# Patient Record
Sex: Female | Born: 1935 | Race: White | Hispanic: Refuse to answer | Marital: Married | Smoking: Former smoker
Health system: Southern US, Community
[De-identification: ages and names within clinical notes are randomized; demographics above are authoritative.]

## PROBLEM LIST (undated history)

## (undated) ENCOUNTER — Emergency Department (HOSPITAL_BASED_OUTPATIENT_CLINIC_OR_DEPARTMENT_OTHER): Admission: EM

## (undated) DIAGNOSIS — Z9889 Other specified postprocedural states: Secondary | ICD-10-CM

## (undated) DIAGNOSIS — I10 Essential (primary) hypertension: Secondary | ICD-10-CM

## (undated) DIAGNOSIS — C801 Malignant (primary) neoplasm, unspecified: Secondary | ICD-10-CM

## (undated) DIAGNOSIS — R112 Nausea with vomiting, unspecified: Secondary | ICD-10-CM

## (undated) DIAGNOSIS — K219 Gastro-esophageal reflux disease without esophagitis: Secondary | ICD-10-CM

## (undated) DIAGNOSIS — E039 Hypothyroidism, unspecified: Secondary | ICD-10-CM

## (undated) DIAGNOSIS — C8589 Other specified types of non-Hodgkin lymphoma, extranodal and solid organ sites: Secondary | ICD-10-CM

## (undated) DIAGNOSIS — S52121A Displaced fracture of head of right radius, initial encounter for closed fracture: Secondary | ICD-10-CM

## (undated) DIAGNOSIS — C8339 Primary central nervous system lymphoma: Secondary | ICD-10-CM

---

## 1998-03-12 ENCOUNTER — Ambulatory Visit (HOSPITAL_COMMUNITY): Admission: RE | Admit: 1998-03-12 | Discharge: 1998-03-12 | Payer: Self-pay | Admitting: *Deleted

## 1998-12-23 ENCOUNTER — Other Ambulatory Visit: Admission: RE | Admit: 1998-12-23 | Discharge: 1998-12-23 | Payer: Self-pay | Admitting: *Deleted

## 1999-03-18 ENCOUNTER — Ambulatory Visit (HOSPITAL_COMMUNITY): Admission: RE | Admit: 1999-03-18 | Discharge: 1999-03-18 | Payer: Self-pay | Admitting: *Deleted

## 2000-04-14 ENCOUNTER — Ambulatory Visit (HOSPITAL_COMMUNITY): Admission: RE | Admit: 2000-04-14 | Discharge: 2000-04-14 | Payer: Self-pay | Admitting: *Deleted

## 2000-05-25 ENCOUNTER — Encounter: Payer: Self-pay | Admitting: Internal Medicine

## 2000-05-25 ENCOUNTER — Other Ambulatory Visit: Admission: RE | Admit: 2000-05-25 | Discharge: 2000-05-25 | Payer: Self-pay | Admitting: Internal Medicine

## 2000-05-25 ENCOUNTER — Ambulatory Visit (HOSPITAL_COMMUNITY): Admission: RE | Admit: 2000-05-25 | Discharge: 2000-05-25 | Payer: Self-pay | Admitting: Family Medicine

## 2001-04-20 ENCOUNTER — Ambulatory Visit (HOSPITAL_COMMUNITY): Admission: RE | Admit: 2001-04-20 | Discharge: 2001-04-20 | Payer: Self-pay | Admitting: Internal Medicine

## 2001-04-20 ENCOUNTER — Encounter: Payer: Self-pay | Admitting: Internal Medicine

## 2001-05-25 ENCOUNTER — Other Ambulatory Visit: Admission: RE | Admit: 2001-05-25 | Discharge: 2001-05-25 | Payer: Self-pay | Admitting: Internal Medicine

## 2001-06-28 ENCOUNTER — Encounter: Admission: RE | Admit: 2001-06-28 | Discharge: 2001-06-28 | Payer: Self-pay | Admitting: Internal Medicine

## 2001-06-28 ENCOUNTER — Encounter: Payer: Self-pay | Admitting: Internal Medicine

## 2002-04-24 ENCOUNTER — Encounter: Payer: Self-pay | Admitting: Internal Medicine

## 2002-04-24 ENCOUNTER — Ambulatory Visit (HOSPITAL_COMMUNITY): Admission: RE | Admit: 2002-04-24 | Discharge: 2002-04-24 | Payer: Self-pay | Admitting: Internal Medicine

## 2002-05-29 ENCOUNTER — Other Ambulatory Visit: Admission: RE | Admit: 2002-05-29 | Discharge: 2002-05-29 | Payer: Self-pay | Admitting: Internal Medicine

## 2003-04-30 ENCOUNTER — Ambulatory Visit (HOSPITAL_COMMUNITY): Admission: RE | Admit: 2003-04-30 | Discharge: 2003-04-30 | Payer: Self-pay | Admitting: Internal Medicine

## 2003-06-11 ENCOUNTER — Ambulatory Visit (HOSPITAL_COMMUNITY): Admission: RE | Admit: 2003-06-11 | Discharge: 2003-06-11 | Payer: Self-pay | Admitting: Internal Medicine

## 2004-05-02 ENCOUNTER — Ambulatory Visit (HOSPITAL_COMMUNITY): Admission: RE | Admit: 2004-05-02 | Discharge: 2004-05-02 | Payer: Self-pay | Admitting: Internal Medicine

## 2004-06-18 ENCOUNTER — Other Ambulatory Visit: Admission: RE | Admit: 2004-06-18 | Discharge: 2004-06-18 | Payer: Self-pay | Admitting: Internal Medicine

## 2005-05-04 ENCOUNTER — Ambulatory Visit (HOSPITAL_COMMUNITY): Admission: RE | Admit: 2005-05-04 | Discharge: 2005-05-04 | Payer: Self-pay | Admitting: Internal Medicine

## 2005-11-19 ENCOUNTER — Ambulatory Visit: Admission: RE | Admit: 2005-11-19 | Discharge: 2005-11-19 | Payer: Self-pay | Admitting: Internal Medicine

## 2006-05-10 ENCOUNTER — Ambulatory Visit (HOSPITAL_COMMUNITY): Admission: RE | Admit: 2006-05-10 | Discharge: 2006-05-10 | Payer: Self-pay | Admitting: Internal Medicine

## 2006-06-24 ENCOUNTER — Other Ambulatory Visit: Admission: RE | Admit: 2006-06-24 | Discharge: 2006-06-24 | Payer: Self-pay | Admitting: Internal Medicine

## 2007-01-05 ENCOUNTER — Emergency Department (HOSPITAL_COMMUNITY): Admission: EM | Admit: 2007-01-05 | Discharge: 2007-01-05 | Payer: Self-pay | Admitting: Emergency Medicine

## 2007-02-01 ENCOUNTER — Encounter: Admission: RE | Admit: 2007-02-01 | Discharge: 2007-02-01 | Payer: Self-pay | Admitting: Neurology

## 2007-05-17 ENCOUNTER — Ambulatory Visit (HOSPITAL_COMMUNITY): Admission: RE | Admit: 2007-05-17 | Discharge: 2007-05-17 | Payer: Self-pay | Admitting: Internal Medicine

## 2008-04-24 ENCOUNTER — Ambulatory Visit: Payer: Self-pay

## 2008-05-24 ENCOUNTER — Ambulatory Visit (HOSPITAL_COMMUNITY): Admission: RE | Admit: 2008-05-24 | Discharge: 2008-05-24 | Payer: Self-pay | Admitting: Internal Medicine

## 2008-07-26 ENCOUNTER — Other Ambulatory Visit: Admission: RE | Admit: 2008-07-26 | Discharge: 2008-07-26 | Payer: Self-pay | Admitting: Internal Medicine

## 2009-05-29 ENCOUNTER — Ambulatory Visit (HOSPITAL_COMMUNITY): Admission: RE | Admit: 2009-05-29 | Discharge: 2009-05-29 | Payer: Self-pay | Admitting: Internal Medicine

## 2010-05-16 ENCOUNTER — Other Ambulatory Visit (HOSPITAL_COMMUNITY): Payer: Self-pay | Admitting: Internal Medicine

## 2010-05-16 DIAGNOSIS — Z1231 Encounter for screening mammogram for malignant neoplasm of breast: Secondary | ICD-10-CM

## 2010-06-10 ENCOUNTER — Ambulatory Visit (HOSPITAL_COMMUNITY)
Admission: RE | Admit: 2010-06-10 | Discharge: 2010-06-10 | Disposition: A | Discharge status: Home / Self Care | Payer: Medicare Other | Source: Ambulatory Visit | Attending: Internal Medicine | Admitting: Internal Medicine

## 2010-06-10 DIAGNOSIS — Z1231 Encounter for screening mammogram for malignant neoplasm of breast: Secondary | ICD-10-CM | POA: Insufficient documentation

## 2011-05-05 ENCOUNTER — Other Ambulatory Visit (HOSPITAL_COMMUNITY): Payer: Self-pay | Admitting: Internal Medicine

## 2011-05-05 DIAGNOSIS — Z1231 Encounter for screening mammogram for malignant neoplasm of breast: Secondary | ICD-10-CM

## 2011-07-07 ENCOUNTER — Ambulatory Visit (HOSPITAL_COMMUNITY)
Admission: RE | Admit: 2011-07-07 | Discharge: 2011-07-07 | Disposition: A | Discharge status: Home / Self Care | Payer: Medicare Other | Source: Ambulatory Visit | Attending: Internal Medicine | Admitting: Internal Medicine

## 2011-07-07 DIAGNOSIS — Z1231 Encounter for screening mammogram for malignant neoplasm of breast: Secondary | ICD-10-CM | POA: Insufficient documentation

## 2012-06-14 ENCOUNTER — Other Ambulatory Visit (HOSPITAL_COMMUNITY): Payer: Self-pay | Admitting: Internal Medicine

## 2012-06-14 DIAGNOSIS — Z1231 Encounter for screening mammogram for malignant neoplasm of breast: Secondary | ICD-10-CM

## 2012-07-13 ENCOUNTER — Ambulatory Visit (HOSPITAL_COMMUNITY)
Admission: RE | Admit: 2012-07-13 | Discharge: 2012-07-13 | Disposition: A | Discharge status: Home / Self Care | Payer: Medicare Other | Source: Ambulatory Visit | Attending: Internal Medicine | Admitting: Internal Medicine

## 2012-07-13 DIAGNOSIS — Z1231 Encounter for screening mammogram for malignant neoplasm of breast: Secondary | ICD-10-CM | POA: Insufficient documentation

## 2012-09-15 NOTE — Progress Notes (Signed)
Pt added on after 430pm-left messages-if unable to get-did lm for her to be npo p mn-bring all rx meds with her-take any htn meds with sips water only and arrive 1045am

## 2012-09-16 ENCOUNTER — Ambulatory Visit (HOSPITAL_BASED_OUTPATIENT_CLINIC_OR_DEPARTMENT_OTHER): Payer: Medicare Other | Admitting: *Deleted

## 2012-09-16 ENCOUNTER — Encounter (HOSPITAL_BASED_OUTPATIENT_CLINIC_OR_DEPARTMENT_OTHER)
Admission: RE | Disposition: A | Discharge status: Home / Self Care | Payer: Self-pay | Source: Ambulatory Visit | Attending: Orthopedic Surgery

## 2012-09-16 ENCOUNTER — Ambulatory Visit (HOSPITAL_BASED_OUTPATIENT_CLINIC_OR_DEPARTMENT_OTHER)
Admission: RE | Admit: 2012-09-16 | Discharge: 2012-09-16 | Disposition: A | Discharge status: Home / Self Care | Payer: Medicare Other | Source: Ambulatory Visit | Attending: Orthopedic Surgery | Admitting: Orthopedic Surgery

## 2012-09-16 ENCOUNTER — Encounter (HOSPITAL_BASED_OUTPATIENT_CLINIC_OR_DEPARTMENT_OTHER): Payer: Self-pay | Admitting: *Deleted

## 2012-09-16 ENCOUNTER — Other Ambulatory Visit: Payer: Self-pay | Admitting: Orthopedic Surgery

## 2012-09-16 ENCOUNTER — Ambulatory Visit
Admission: RE | Admit: 2012-09-16 | Discharge: 2012-09-16 | Disposition: A | Discharge status: Home / Self Care | Payer: Medicare Other | Source: Ambulatory Visit | Attending: Orthopedic Surgery | Admitting: Orthopedic Surgery

## 2012-09-16 DIAGNOSIS — S52121A Displaced fracture of head of right radius, initial encounter for closed fracture: Secondary | ICD-10-CM

## 2012-09-16 DIAGNOSIS — Z888 Allergy status to other drugs, medicaments and biological substances status: Secondary | ICD-10-CM | POA: Insufficient documentation

## 2012-09-16 DIAGNOSIS — Z91018 Allergy to other foods: Secondary | ICD-10-CM | POA: Insufficient documentation

## 2012-09-16 DIAGNOSIS — S52123A Displaced fracture of head of unspecified radius, initial encounter for closed fracture: Secondary | ICD-10-CM | POA: Insufficient documentation

## 2012-09-16 DIAGNOSIS — K219 Gastro-esophageal reflux disease without esophagitis: Secondary | ICD-10-CM | POA: Insufficient documentation

## 2012-09-16 DIAGNOSIS — I10 Essential (primary) hypertension: Secondary | ICD-10-CM | POA: Insufficient documentation

## 2012-09-16 DIAGNOSIS — Z882 Allergy status to sulfonamides status: Secondary | ICD-10-CM | POA: Insufficient documentation

## 2012-09-16 DIAGNOSIS — Z87891 Personal history of nicotine dependence: Secondary | ICD-10-CM | POA: Insufficient documentation

## 2012-09-16 DIAGNOSIS — E039 Hypothyroidism, unspecified: Secondary | ICD-10-CM | POA: Insufficient documentation

## 2012-09-16 DIAGNOSIS — W010XXA Fall on same level from slipping, tripping and stumbling without subsequent striking against object, initial encounter: Secondary | ICD-10-CM | POA: Insufficient documentation

## 2012-09-16 DIAGNOSIS — Z79899 Other long term (current) drug therapy: Secondary | ICD-10-CM | POA: Insufficient documentation

## 2012-09-16 HISTORY — DX: Hypothyroidism, unspecified: E03.9

## 2012-09-16 HISTORY — PX: RADIAL HEAD ARTHROPLASTY: SHX6044

## 2012-09-16 HISTORY — DX: Displaced fracture of head of right radius, initial encounter for closed fracture: S52.121A

## 2012-09-16 HISTORY — DX: Acute intermittent (hepatic) porphyria: E80.21

## 2012-09-16 HISTORY — DX: Gastro-esophageal reflux disease without esophagitis: K21.9

## 2012-09-16 HISTORY — DX: Essential (primary) hypertension: I10

## 2012-09-16 LAB — POCT I-STAT, CHEM 8
BUN: 14 mg/dL (ref 6–23)
Calcium, Ion: 1.26 mmol/L (ref 1.13–1.30)
Chloride: 99 mEq/L (ref 96–112)
Creatinine, Ser: 0.6 mg/dL (ref 0.50–1.10)
TCO2: 27 mmol/L (ref 0–100)

## 2012-09-16 SURGERY — ARTHROPLASTY, RADIUS, HEAD
Anesthesia: General | Laterality: Right | Wound class: Clean

## 2012-09-16 MED ORDER — CEFAZOLIN SODIUM-DEXTROSE 2-3 GM-% IV SOLR
INTRAVENOUS | Status: DC | PRN
  Administered 2012-09-16: 2 g via INTRAVENOUS

## 2012-09-16 MED ORDER — BUPIVACAINE-EPINEPHRINE PF 0.5-1:200000 % IJ SOLN
INTRAMUSCULAR | Status: DC | PRN
  Administered 2012-09-16: 150 mg

## 2012-09-16 MED ORDER — PROMETHAZINE HCL 25 MG/ML IJ SOLN: 6.2500 mg | INTRAMUSCULAR | Status: DC | PRN

## 2012-09-16 MED ORDER — FENTANYL CITRATE 0.05 MG/ML IJ SOLN
50.0000 ug | INTRAMUSCULAR | Status: DC | PRN
  Administered 2012-09-16: 100 ug via INTRAVENOUS

## 2012-09-16 MED ORDER — LACTATED RINGERS IV SOLN
INTRAVENOUS | Status: DC
  Administered 2012-09-16 (×2): via INTRAVENOUS

## 2012-09-16 MED ORDER — EPHEDRINE SULFATE 50 MG/ML IJ SOLN
INTRAMUSCULAR | Status: DC | PRN
  Administered 2012-09-16 (×3): 5 mg via INTRAVENOUS

## 2012-09-16 MED ORDER — ONDANSETRON HCL 4 MG/2ML IJ SOLN
INTRAMUSCULAR | Status: DC | PRN
  Administered 2012-09-16: 4 mg via INTRAVENOUS

## 2012-09-16 MED ORDER — OXYCODONE HCL 5 MG/5ML PO SOLN: 5.0000 mg | Freq: Once | ORAL | Status: DC | PRN

## 2012-09-16 MED ORDER — DEXAMETHASONE SODIUM PHOSPHATE 4 MG/ML IJ SOLN
INTRAMUSCULAR | Status: DC | PRN
  Administered 2012-09-16: 8 mg via INTRAVENOUS

## 2012-09-16 MED ORDER — PROPOFOL 10 MG/ML IV BOLUS
INTRAVENOUS | Status: DC | PRN
  Administered 2012-09-16: 160 mg via INTRAVENOUS

## 2012-09-16 MED ORDER — GLYCOPYRROLATE 0.2 MG/ML IJ SOLN
INTRAMUSCULAR | Status: DC | PRN
  Administered 2012-09-16 (×2): 0.1 mg via INTRAVENOUS

## 2012-09-16 MED ORDER — HYDROMORPHONE HCL PF 1 MG/ML IJ SOLN: 0.2500 mg | INTRAMUSCULAR | Status: DC | PRN

## 2012-09-16 MED ORDER — ONDANSETRON HCL 4 MG PO TABS: 4.0000 mg | ORAL_TABLET | Freq: Three times a day (TID) | ORAL | Status: DC | PRN

## 2012-09-16 MED ORDER — MIDAZOLAM HCL 2 MG/2ML IJ SOLN
1.0000 mg | INTRAMUSCULAR | Status: DC | PRN
  Administered 2012-09-16: 2 mg via INTRAVENOUS

## 2012-09-16 MED ORDER — FENTANYL CITRATE 0.05 MG/ML IJ SOLN
INTRAMUSCULAR | Status: DC | PRN
  Administered 2012-09-16: 100 ug via INTRAVENOUS

## 2012-09-16 MED ORDER — OXYCODONE HCL 5 MG PO TABS: 5.0000 mg | ORAL_TABLET | Freq: Once | ORAL | Status: DC | PRN

## 2012-09-16 MED ORDER — SENNA-DOCUSATE SODIUM 8.6-50 MG PO TABS: 2.0000 | ORAL_TABLET | Freq: Every day | ORAL | Status: DC

## 2012-09-16 SURGICAL SUPPLY — 71 items
APL SKNCLS STERI-STRIP NONHPOA (GAUZE/BANDAGES/DRESSINGS) ×1
BANDAGE ELASTIC 3 VELCRO ST LF (GAUZE/BANDAGES/DRESSINGS) IMPLANT
BANDAGE ELASTIC 4 VELCRO ST LF (GAUZE/BANDAGES/DRESSINGS) ×2 IMPLANT
BENZOIN TINCTURE PRP APPL 2/3 (GAUZE/BANDAGES/DRESSINGS) ×2 IMPLANT
BLADE AVERAGE 25X9 (BLADE) ×1 IMPLANT
BLADE MINI RND TIP GREEN BEAV (BLADE) IMPLANT
BLADE SURG 15 STRL LF DISP TIS (BLADE) ×1 IMPLANT
BLADE SURG 15 STRL SS (BLADE) ×2
BNDG CMPR 9X4 STRL LF SNTH (GAUZE/BANDAGES/DRESSINGS) ×1
BNDG COHESIVE 3X5 TAN STRL LF (GAUZE/BANDAGES/DRESSINGS) ×1 IMPLANT
BNDG ESMARK 4X9 LF (GAUZE/BANDAGES/DRESSINGS) ×2 IMPLANT
CANISTER SUCTION 1200CC (MISCELLANEOUS) ×2 IMPLANT
CLOTH BEACON ORANGE TIMEOUT ST (SAFETY) ×2 IMPLANT
COVER TABLE BACK 60X90 (DRAPES) ×2 IMPLANT
CUFF TOURNIQUET SINGLE 18IN (TOURNIQUET CUFF) ×1 IMPLANT
DECANTER SPIKE VIAL GLASS SM (MISCELLANEOUS) IMPLANT
DRAPE EXTREMITY T 121X128X90 (DRAPE) ×2 IMPLANT
DRAPE OEC MINIVIEW 54X84 (DRAPES) ×2 IMPLANT
DRAPE U 20/CS (DRAPES) ×2 IMPLANT
DRAPE U-SHAPE 47X51 STRL (DRAPES) ×2 IMPLANT
DURAPREP 26ML APPLICATOR (WOUND CARE) ×2 IMPLANT
ELECT REM PT RETURN 9FT ADLT (ELECTROSURGICAL) ×2
ELECTRODE REM PT RTRN 9FT ADLT (ELECTROSURGICAL) ×1 IMPLANT
GLOVE BIO SURGEON STRL SZ 6.5 (GLOVE) ×1 IMPLANT
GLOVE BIOGEL PI IND STRL 7.0 (GLOVE) IMPLANT
GLOVE BIOGEL PI IND STRL 8 (GLOVE) ×2 IMPLANT
GLOVE BIOGEL PI INDICATOR 7.0 (GLOVE) ×1
GLOVE BIOGEL PI INDICATOR 8 (GLOVE) ×3
GLOVE ORTHO TXT STRL SZ7.5 (GLOVE) ×2 IMPLANT
GLOVE SURG ORTHO 8.0 STRL STRW (GLOVE) ×5 IMPLANT
GOWN BRE IMP PREV XXLGXLNG (GOWN DISPOSABLE) ×5 IMPLANT
GOWN PREVENTION PLUS XLARGE (GOWN DISPOSABLE) ×1 IMPLANT
HEAD TR ANGLED DG 4X22MM (Orthopedic Implant) ×1 IMPLANT
NDL HYPO 25X1 1.5 SAFETY (NEEDLE) IMPLANT
NEEDLE HYPO 25X1 1.5 SAFETY (NEEDLE) IMPLANT
NS IRRIG 1000ML POUR BTL (IV SOLUTION) ×2 IMPLANT
PACK BASIN DAY SURGERY FS (CUSTOM PROCEDURE TRAY) ×2 IMPLANT
PAD CAST 3X4 CTTN HI CHSV (CAST SUPPLIES) IMPLANT
PAD CAST 4YDX4 CTTN HI CHSV (CAST SUPPLIES) ×1 IMPLANT
PADDING CAST ABS 4INX4YD NS (CAST SUPPLIES) ×2
PADDING CAST ABS COTTON 4X4 ST (CAST SUPPLIES) ×1 IMPLANT
PADDING CAST COTTON 3X4 STRL (CAST SUPPLIES)
PADDING CAST COTTON 4X4 STRL (CAST SUPPLIES) ×2
PENCIL BUTTON HOLSTER BLD 10FT (ELECTRODE) ×2 IMPLANT
SLEEVE SCD COMPRESS KNEE MED (MISCELLANEOUS) ×2 IMPLANT
SLEEVE SURGEON STRL (DRAPES) ×1 IMPLANT
SPLINT FAST PLASTER 5X30 (CAST SUPPLIES)
SPLINT PLASTER CAST FAST 5X30 (CAST SUPPLIES) IMPLANT
SPLINT PLASTER CAST XFAST 4X15 (CAST SUPPLIES) IMPLANT
SPLINT PLASTER XTRA FAST SET 4 (CAST SUPPLIES)
SPONGE GAUZE 4X4 12PLY (GAUZE/BANDAGES/DRESSINGS) ×2 IMPLANT
SPONGE LAP 4X18 X RAY DECT (DISPOSABLE) ×2 IMPLANT
STOCKINETTE 4X48 STRL (DRAPES) ×2 IMPLANT
STRIP CLOSURE SKIN 1/2X4 (GAUZE/BANDAGES/DRESSINGS) ×2 IMPLANT
SUCTION FRAZIER TIP 10 FR DISP (SUCTIONS) ×2 IMPLANT
SUT ETHIBOND 0 MO6 C/R (SUTURE) IMPLANT
SUT ETHILON 3 0 PS 1 (SUTURE) IMPLANT
SUT ETHILON 4 0 PS 2 18 (SUTURE) IMPLANT
SUT MNCRL AB 4-0 PS2 18 (SUTURE) IMPLANT
SUT VIC AB 0 CT1 27 (SUTURE) ×2
SUT VIC AB 0 CT1 27XBRD ANBCTR (SUTURE) IMPLANT
SUT VIC AB 3-0 SH 27 (SUTURE)
SUT VIC AB 3-0 SH 27X BRD (SUTURE) IMPLANT
SUT VICRYL 3-0 CR8 SH (SUTURE) ×2 IMPLANT
SYR BULB 3OZ (MISCELLANEOUS) ×2 IMPLANT
SYR CONTROL 10ML LL (SYRINGE) IMPLANT
SYS STEM ANAT RAD HEAD 8.0MMX2 (Orthopedic Implant) ×2 IMPLANT
SYSTEM STEM ANA RAD HD 8.0MMX2 (Orthopedic Implant) IMPLANT
TOWEL OR 17X24 6PK STRL BLUE (TOWEL DISPOSABLE) ×2 IMPLANT
TUBE CONNECTING 20X1/4 (TUBING) ×2 IMPLANT
UNDERPAD 30X30 INCONTINENT (UNDERPADS AND DIAPERS) ×2 IMPLANT

## 2012-09-16 NOTE — Transfer of Care (Signed)
Immediate Anesthesia Transfer of Care Note  Patient: Daisy Oliver  Procedure(s) Performed: Procedure(s) with comments: RADIAL HEAD ARTHROPLASTY (Right) - RADIAL HEAD REPLACEMENT    Patient Location: PACU  Anesthesia Type:GA combined with regional for post-op pain  Level of Consciousness: awake, alert , oriented and patient cooperative  Airway & Oxygen Therapy: Patient Spontanous Breathing and Patient connected to face mask oxygen  Post-op Assessment: Report given to PACU RN and Post -op Vital signs reviewed and stable  Post vital signs: Reviewed and stable  Complications: No apparent anesthesia complications

## 2012-09-16 NOTE — H&P (Signed)
PREOPERATIVE H&P  Chief Complaint: RT ELBOW RADIAL HEAD FX  HPI: Daisy Oliver is a 77 y.o. female who presents for preoperative history and physical with a diagnosis of RT ELBOW RADIAL HEAD FX. Symptoms are rated as moderate to severe, and have been worsening.  This is significantly impairing activities of daily living.  She has elected for surgical management. This occurred after a trip over a electric cord. She had acute onset pain in the elbow, had x-rays taken, diagnosed with a displaced comminuted radial head fracture. Preoperative CT scan was also performed.  Past Medical History  Diagnosis Date  . Hypertension   . Hypothyroidism   . GERD (gastroesophageal reflux disease)     pepcid prn  . Acute intermittent porphyria     diagnosed at age 73   History reviewed. No pertinent past surgical history. History   Social History  . Marital Status: Married    Spouse Name: N/A    Number of Children: N/A  . Years of Education: N/A   Social History Main Topics  . Smoking status: Former Smoker -- 2.00 packs/day    Types: Cigarettes    Quit date: 09/16/1993  . Smokeless tobacco: Never Used  . Alcohol Use: 1.2 oz/week    2 Shots of liquor per week     Comment: 4 ounces vodka per night  . Drug Use: No  . Sexually Active: Yes   Other Topics Concern  . None   Social History Narrative  . None   History reviewed. No pertinent family history. Allergies  Allergen Reactions  . Barbiturates   . Pentothal (Thiopental)   . Sulfa Antibiotics   . Wheat Bran Nausea And Vomiting   Prior to Admission medications   Medication Sig Start Date End Date Taking? Authorizing Provider  5-Hydroxytryptophan (5-HTP) 100 MG CAPS Take by mouth.   Yes Historical Provider, MD  BORON PO Take by mouth.   Yes Historical Provider, MD  Calcium-Magnesium-Vitamin D (CALCIUM 1200+D3 PO) Take by mouth.   Yes Historical Provider, MD  Digestive Enzymes (BETAINE HCL PO) Take by mouth.   Yes Historical  Provider, MD  Ergocalciferol (VITAMIN D2) 2000 UNITS TABS Take by mouth.   Yes Historical Provider, MD  Evening Primrose Oil 1000 MG CAPS Take by mouth.   Yes Historical Provider, MD  Iodine, Kelp, TABS Take by mouth.   Yes Historical Provider, MD  liothyronine (CYTOMEL) 5 MCG tablet Take 5 mcg by mouth daily.   Yes Historical Provider, MD  Magnesium 500 MG CAPS Take by mouth.   Yes Historical Provider, MD  Misc Natural Products (OSTEO BI-FLEX ADV JOINT SHIELD PO) Take by mouth.   Yes Historical Provider, MD  NON FORMULARY Ultra nutrient pure encapsulations multiple vitamin   Yes Historical Provider, MD  NON FORMULARY 5-MTHFES bioactive folate as quatrefolic   Yes Historical Provider, MD  NON FORMULARY Balance D dietary supplement   Yes Historical Provider, MD  olmesartan (BENICAR) 20 MG tablet Take 20 mg by mouth daily.   Yes Historical Provider, MD  Omega-3 Fatty Acids (FISH OIL) 1200 MG CAPS Take by mouth.   Yes Historical Provider, MD  oxyCODONE (OXY IR/ROXICODONE) 5 MG immediate release tablet Take 5 mg by mouth every 4 (four) hours as needed for pain.   Yes Historical Provider, MD  Taurine 1000 MG CAPS Take by mouth.   Yes Historical Provider, MD  thyroid (ARMOUR) 32.5 MG tablet Take 32.5 mg by mouth daily.   Yes Historical Provider, MD  Tyrosine 500 MG CAPS Take by mouth.   Yes Historical Provider, MD  Zinc 50 MG CAPS Take by mouth.   Yes Historical Provider, MD     Positive ROS: All other systems have been reviewed and were otherwise negative with the exception of those mentioned in the HPI and as above.  Physical Exam: General: Alert, no acute distress Cardiovascular: No pedal edema Respiratory: No cyanosis, no use of accessory musculature GI: No organomegaly, abdomen is soft and non-tender Skin: No lesions in the area of chief complaint Neurologic: Sensation intact distally Psychiatric: Patient is competent for consent with normal mood and affect Lymphatic: No axillary or  cervical lymphadenopathy  MUSCULOSKELETAL: Right elbow has positive pain to palpation, unable to do any significant motion. All fingers do flex extend and abduct. She has bruising around the right arm with some swelling around the hand as well. She has some mild wrist pain as well.  Assessment: RT ELBOW RADIAL HEAD FX  Plan: Plan for Procedure(s): RADIAL HEAD ARTHROPLASTY versus open reduction internal fixation, although I doubt that the pieces will be adequate for fixation, and given her age, probable osteopenia, and configuration of the fracture, arthroplasty will likely be the best course of action.  The risks benefits and alternatives were discussed with the patient including but not limited to the risks of nonoperative treatment, versus surgical intervention including infection, bleeding, nerve injury,  blood clots, cardiopulmonary complications, morbidity, mortality, among others, and they were willing to proceed. We've also discussed the risks for elbow dislocation, the need for revision surgery, radial nerve palsy, stiffness, loss of function, among others.  Eulas Post, MD Cell [phone]   09/16/2012 1:53 PM

## 2012-09-16 NOTE — Anesthesia Preprocedure Evaluation (Addendum)
Anesthesia Evaluation    Reviewed: Allergy & Precautions, H&P , NPO status , Patient's Chart, lab work & pertinent test results  History of Anesthesia Complications Negative for: history of anesthetic complications  Airway       Dental   Pulmonary neg pulmonary ROS,          Cardiovascular hypertension, Pt. on medications     Neuro/Psych negative neurological ROS  negative psych ROS   GI/Hepatic negative GI ROS, Neg liver ROS, GERD-  Medicated,  Endo/Other  Hypothyroidism   Renal/GU negative Renal ROS     Musculoskeletal   Abdominal   Peds  Hematology Porphyria: AIP   Anesthesia Other Findings   Reproductive/Obstetrics                          Anesthesia Physical Anesthesia Plan  ASA: III  Anesthesia Plan: General   Post-op Pain Management:    Induction: Intravenous  Airway Management Planned: LMA  Additional Equipment:   Intra-op Plan:   Post-operative Plan: Extubation in OR  Informed Consent:   Plan Discussed with: CRNA, Anesthesiologist and Surgeon  Anesthesia Plan Comments:        Anesthesia Quick Evaluation

## 2012-09-16 NOTE — Anesthesia Postprocedure Evaluation (Signed)
Anesthesia Post Note  Patient: Daisy Oliver  Procedure(s) Performed: Procedure(s) (LRB): RADIAL HEAD ARTHROPLASTY (Right)  Anesthesia type: general  Patient location: PACU  Post pain: Pain level controlled  Post assessment: Patient's Cardiovascular Status Stable  Last Vitals:  Filed Vitals:   09/16/12 1730  BP: 114/56  Pulse: 67  Temp: 36.7 C  Resp: 16    Post vital signs: Reviewed and stable  Level of consciousness: sedated  Complications: No apparent anesthesia complications

## 2012-09-16 NOTE — Anesthesia Procedure Notes (Addendum)
Anesthesia Regional Block:  Supraclavicular block  Pre-Anesthetic Checklist: ,, timeout performed, Correct Patient, Correct Site, Correct Laterality, Correct Procedure, Correct Position, site marked, Risks and benefits discussed,  Surgical consent,  Pre-op evaluation,  At surgeon's request and post-op pain management  Laterality: Right  Prep: chloraprep       Needles:  Injection technique: Single-shot  Needle Type: Echogenic Stimulator Needle     Needle Length: 5cm 5 cm Needle Gauge: 22 and 22 G    Additional Needles:  Procedures: ultrasound guided (picture in chart) and nerve stimulator Supraclavicular block  Nerve Stimulator or Paresthesia:  Response: bicep contraction, 0.45 mA,   Additional Responses:   Narrative:  Start time: 09/16/2012 12:48 PM End time: 09/16/2012 12:57 PM Injection made incrementally with aspirations every 5 mL.  Performed by: Personally  Anesthesiologist: J. Adonis Huguenin, MD  Additional Notes: Functioning IV was confirmed and monitors applied.  A 50mm 22ga echogenic arrow stimulator was used. Sterile prep and drape,hand hygiene and sterile gloves were used.Ultrasound guidance: relevant anatomy identified, needle position confirmed, local anesthetic spread visualized around nerve(s)., vascular puncture avoided.  Image printed for medical record.  Negative aspiration and negative test dose prior to incremental administration of local anesthetic. The patient tolerated the procedure well.  Interscalene brachial plexus block Procedure Name: LMA Insertion Date/Time: 09/16/2012 2:32 PM Performed by: Verlan Friends Pre-anesthesia Checklist: Patient identified, Emergency Drugs available, Suction available, Patient being monitored and Timeout performed Patient Re-evaluated:Patient Re-evaluated prior to inductionOxygen Delivery Method: Circle System Utilized Preoxygenation: Pre-oxygenation with 100% oxygen Intubation Type: IV induction Ventilation: Mask  ventilation without difficulty LMA: LMA inserted LMA Size: 4.0 Number of attempts: 1 Airway Equipment and Method: bite block Placement Confirmation: positive ETCO2 Tube secured with: Tape Dental Injury: Teeth and Oropharynx as per pre-operative assessment

## 2012-09-16 NOTE — Op Note (Signed)
09/16/2012  3:40 PM  PATIENT:  Daisy Oliver    PRE-OPERATIVE DIAGNOSIS:  RT ELBOW RADIAL HEAD FRACTURE  POST-OPERATIVE DIAGNOSIS:  Same  PROCEDURE:  RADIAL HEAD ARTHROPLASTY  SURGEON:  Eulas Post, MD  PHYSICIAN ASSISTANT: Janace Litten, OPA-C, present and scrubbed throughout the case, critical for completion in a timely fashion, and for retraction, instrumentation, and closure.  ANESTHESIA:   General  PREOPERATIVE INDICATIONS:  Daisy Oliver is a  77 y.o. female with a diagnosis of RT ELBOW RADIAL HEAD FX who elected for surgical management.  She had substantial displacement, with splitting of the head in the multiple fragments. A separate from the shaft.  The risks benefits and alternatives were discussed with the patient preoperatively including but not limited to the risks of infection, bleeding, nerve injury, cardiopulmonary complications, the need for revision surgery, among others, and the patient was willing to proceed. We also discussed the risks for hardware prominence, hardware failure, post traumatic arthritis, dislocation, stiffness, regional pain syndrome, the need for revision surgery, radial nerve palsy, among others.  OPERATIVE IMPLANTS: Acumed radial head, size 8 stem, +2 neck, 22 mm head.  OPERATIVE FINDINGS: Comminuted radial head fracture, in 2 major segments, both separate from the shaft.  OPERATIVE PROCEDURE: The patient was brought to the operating room and placed in the supine position. General anesthesia was administered. IV antibiotics were given. The right upper extremity was prepped and draped in usual sterile fashion. The arm was elevated and exsanguinated and the tourniquet was inflated. Time out was performed. Lateral incision was made over the lateral epicondyle heading towards the ulna. Dissection was carried down, and incision was made through the anterior capsular tissue through the extensor origin. The lateral collateral ligament was maintained,  and not reflected, and the incision remaining anterior to the center of the capitellum. In this fashion I protected the collateral ligament. The capsule was reflected anteriorly, and I exposed the dislocated fragment, which was removed. I also exposed the remainder of the head, which was loose, and this was also removed with a pickup. The 2 pieces were assembled on the back table, sized to a size 22. He was actually approximately a size 23, but 22 fit nicely and did not overstuff the joint.  I then used a rongeur to trim down the bone fragment at the fracture site on the shaft level. I introduced the canal finder, and then sequentially broached up to a size 8. I introduced the sizing guide, and it measured to a +2, where the head came into contact with the capitellum.  The above named components were selected, the wounds irrigated and any loose debris removed. I did introduce the reamer, to bevel the neck cut to the exact smooth alignment. 100% contact with the collar was achieved.  I then selected the above-named component, placed him on the back table, and then impacted the real prosthesis into place and reduced the radial head with a satisfactory reduction. Soft tissue tension was appropriate. The elbow had full smooth arc of motion, although there was a slight click when I pronated the wrist to well beyond 90, maybe even at 115, however overall there was very satisfactory alignment. I did align the prosthesis with the laser line directed parallel with Lister's tubercle, recreating the appropriate offset.  The wounds were irrigated copiously, the elbow was stressed and visualized under live fluoroscopy and found to be stable, and the capsule and extensor origin was repaired with 0 Vicryl, followed by 3-0 Vicryl for  subcutaneous tissue, with 4-0 Monocryl with Steri-Strips and sterile gauze for the skin. A posterior splint was applied. The patient was awakened and returned to the PACU in stable and  satisfactory condition. There were no complications and she tolerated the procedure well.  Eulas Post, MD

## 2012-09-16 NOTE — Progress Notes (Signed)
Assisted Dr. Singer with right, ultrasound guided, supraclavicular block. Side rails up, monitors on throughout procedure. See vital signs in flow sheet. Tolerated Procedure well. 

## 2012-09-19 ENCOUNTER — Encounter (HOSPITAL_BASED_OUTPATIENT_CLINIC_OR_DEPARTMENT_OTHER): Payer: Self-pay | Admitting: Orthopedic Surgery

## 2013-06-13 ENCOUNTER — Other Ambulatory Visit: Payer: Self-pay | Admitting: Internal Medicine

## 2013-06-13 DIAGNOSIS — Z1231 Encounter for screening mammogram for malignant neoplasm of breast: Secondary | ICD-10-CM

## 2013-07-17 ENCOUNTER — Ambulatory Visit (HOSPITAL_COMMUNITY)
Admission: RE | Admit: 2013-07-17 | Discharge: 2013-07-17 | Disposition: A | Discharge status: Home / Self Care | Payer: Medicare Other | Source: Ambulatory Visit | Attending: Internal Medicine | Admitting: Internal Medicine

## 2013-07-17 DIAGNOSIS — Z1231 Encounter for screening mammogram for malignant neoplasm of breast: Secondary | ICD-10-CM | POA: Insufficient documentation

## 2013-08-25 ENCOUNTER — Encounter: Payer: Self-pay | Admitting: Physician Assistant

## 2013-08-25 ENCOUNTER — Ambulatory Visit (INDEPENDENT_AMBULATORY_CARE_PROVIDER_SITE_OTHER): Payer: Medicare Other | Admitting: Physician Assistant

## 2013-08-25 ENCOUNTER — Ambulatory Visit (HOSPITAL_COMMUNITY)
Admission: RE | Admit: 2013-08-25 | Discharge: 2013-08-25 | Disposition: A | Discharge status: Home / Self Care | Payer: Medicare Other | Source: Ambulatory Visit | Attending: Physician Assistant | Admitting: Physician Assistant

## 2013-08-25 VITALS — BP 132/60 | HR 60 | Temp 98.7°F | Resp 16 | Ht 64.0 in | Wt 176.0 lb

## 2013-08-25 DIAGNOSIS — I10 Essential (primary) hypertension: Secondary | ICD-10-CM | POA: Insufficient documentation

## 2013-08-25 DIAGNOSIS — Z1331 Encounter for screening for depression: Secondary | ICD-10-CM

## 2013-08-25 DIAGNOSIS — R21 Rash and other nonspecific skin eruption: Secondary | ICD-10-CM

## 2013-08-25 DIAGNOSIS — Z9181 History of falling: Secondary | ICD-10-CM

## 2013-08-25 DIAGNOSIS — G43909 Migraine, unspecified, not intractable, without status migrainosus: Secondary | ICD-10-CM

## 2013-08-25 DIAGNOSIS — R030 Elevated blood-pressure reading, without diagnosis of hypertension: Secondary | ICD-10-CM

## 2013-08-25 DIAGNOSIS — E559 Vitamin D deficiency, unspecified: Secondary | ICD-10-CM

## 2013-08-25 DIAGNOSIS — Z79899 Other long term (current) drug therapy: Secondary | ICD-10-CM

## 2013-08-25 DIAGNOSIS — E2839 Other primary ovarian failure: Secondary | ICD-10-CM

## 2013-08-25 DIAGNOSIS — E785 Hyperlipidemia, unspecified: Secondary | ICD-10-CM

## 2013-08-25 DIAGNOSIS — Z87891 Personal history of nicotine dependence: Secondary | ICD-10-CM | POA: Insufficient documentation

## 2013-08-25 DIAGNOSIS — Z0000 Encounter for general adult medical examination without abnormal findings: Secondary | ICD-10-CM

## 2013-08-25 DIAGNOSIS — L719 Rosacea, unspecified: Secondary | ICD-10-CM | POA: Insufficient documentation

## 2013-08-25 LAB — MAGNESIUM: MAGNESIUM: 2.3 mg/dL (ref 1.5–2.5)

## 2013-08-25 LAB — LIPID PANEL
CHOL/HDL RATIO: 2.8 ratio
CHOLESTEROL: 179 mg/dL (ref 0–200)
HDL: 65 mg/dL (ref >39)
LDL CALC: 94 mg/dL (ref 0–99)
TRIGLYCERIDES: 100 mg/dL (ref <150)
VLDL: 20 mg/dL (ref 0–40)

## 2013-08-25 LAB — HEPATIC FUNCTION PANEL
ALT: 27 U/L (ref 0–35)
AST: 28 U/L (ref 0–37)
Albumin: 4.4 g/dL (ref 3.5–5.2)
Alkaline Phosphatase: 74 U/L (ref 39–117)
BILIRUBIN DIRECT: 0.2 mg/dL (ref 0.0–0.3)
BILIRUBIN INDIRECT: 0.8 mg/dL (ref 0.2–1.2)
BILIRUBIN TOTAL: 1 mg/dL (ref 0.2–1.2)
Total Protein: 7.4 g/dL (ref 6.0–8.3)

## 2013-08-25 LAB — CBC WITH DIFFERENTIAL/PLATELET
BASOS PCT: 1 % (ref 0–1)
Basophils Absolute: 0.1 10*3/uL (ref 0.0–0.1)
EOS ABS: 0.3 10*3/uL (ref 0.0–0.7)
Eosinophils Relative: 5 % (ref 0–5)
HCT: 38.8 % (ref 36.0–46.0)
HEMOGLOBIN: 13.5 g/dL (ref 12.0–15.0)
Lymphocytes Relative: 33 % (ref 12–46)
Lymphs Abs: 1.9 10*3/uL (ref 0.7–4.0)
MCH: 30.8 pg (ref 26.0–34.0)
MCHC: 34.8 g/dL (ref 30.0–36.0)
MCV: 88.6 fL (ref 78.0–100.0)
MONOS PCT: 9 % (ref 3–12)
Monocytes Absolute: 0.5 10*3/uL (ref 0.1–1.0)
NEUTROS ABS: 3 10*3/uL (ref 1.7–7.7)
NEUTROS PCT: 52 % (ref 43–77)
PLATELETS: 221 10*3/uL (ref 150–400)
RBC: 4.38 MIL/uL (ref 3.87–5.11)
RDW: 13.5 % (ref 11.5–15.5)
WBC: 5.7 10*3/uL (ref 4.0–10.5)

## 2013-08-25 LAB — BASIC METABOLIC PANEL WITH GFR
BUN: 18 mg/dL (ref 6–23)
CALCIUM: 10 mg/dL (ref 8.4–10.5)
CO2: 28 mEq/L (ref 19–32)
Chloride: 102 mEq/L (ref 96–112)
Creat: 0.71 mg/dL (ref 0.50–1.10)
GFR, EST NON AFRICAN AMERICAN: 84 mL/min
Glucose, Bld: 93 mg/dL (ref 70–99)
POTASSIUM: 4.1 meq/L (ref 3.5–5.3)
SODIUM: 139 meq/L (ref 135–145)

## 2013-08-25 NOTE — Patient Instructions (Signed)

## 2013-08-25 NOTE — Progress Notes (Signed)
Assessment:   COLONOSCOPY  1. Elevated blood pressure reading without diagnosis of hypertension - CBC with Differential - BASIC METABOLIC PANEL WITH GFR - Hepatic function panel - TSH - Urinalysis, Routine w reflex microscopic - Microalbumin / creatinine urine ratio - DG Chest 2 View; Future - EKG 12-Lead  2. Other and unspecified hyperlipidemia - Lipid panel  3. Migraines rare  4. Rosacea Refer DERM- controlled at this time  5. Estrogen deficiency - DG Bone Density; Future  6. Encounter for long-term (current) use of other medications - Magnesium  7. Unspecified vitamin D deficiency - Vit D  25 hydroxy (rtn osteoporosis monitoring)  8. Rash and nonspecific skin eruption - Ambulatory referral to Dermatology   Plan:   During the course of the visit the patient was educated and counseled about appropriate screening and preventive services including:    Pneumococcal vaccine   Influenza vaccine  Td vaccine  Screening electrocardiogram  Screening mammography  Screening Pap smear and pelvic exam   Bone densitometry screening  Colorectal cancer screening  Diabetes screening  Glaucoma screening  Nutrition counseling   Advanced directives: given information  Screening recommendations, referrals:  Vaccinations: Tdap vaccine next year  Influenza vaccine declined Pneumococcal vaccine not indicated Shingles vaccine declined Hep B vaccine not indicated  Nutrition assessed and recommended  Colonoscopy DUE- orderd Mammogram next year Pap smear not indicated Pelvic exam not indicated Recommended yearly ophthalmology/optometry visit for glaucoma screening and checkup Recommended yearly dental visit for hygiene and checkup Advanced directives - given information  Conditions/risks identified: BMI: Discussed weight loss, diet, and increase physical activity.  Increase physical activity: AHA recommends 150 minutes of physical activity a week.   Medications reviewed DEXA- ordered Urinary Incontinence is not an issue: discussed non pharmacology and pharmacology options.  Fall risk: low- discussed PT, home fall assessment, medications.   Subjective:   Daisy Oliver is a 78 y.o. female who presents for Medicare Annual Wellness Visit and complete physical.    Date of last medicare wellness visit is unknown.  Her blood pressure has been controlled at home, today their BP is BP: 132/60 mmHg She does workout, she started silver sneakers in Jan.  She denies chest pain, shortness of breath, dizziness.  She is not on cholesterol medication and denies myalgias. Her cholesterol is at goal. The cholesterol last visit was:  LDL 87 Patient is on Vitamin D supplement.  56 Had surgery on her right elbow s/p fall 09/2012, she has been released from care but continues to have some discomfort in the arm.  She states she has been having a intermittent rash on her neck and hip. Would like a new Derm Recently got back from a trip from [place] and [place].   Names of Other Physician/Practitioners you currently use: 1. [facility]- here for primary care 2. Dr. Pandora Leiter, eye doctor, last visit last spring 3.  Dr. Rockney Ghee dentist, last visit q 6 months Patient Care Team: [name], MD as PCP - General (Internal Medicine) Izora Gala, MD as Consulting Physician (Otolaryngology) Winfield Cunas., MD as Consulting Physician (Gastroenterology) Edison Nasuti, MD as Consulting Physician (Neurology)   Medication Review Current Outpatient Prescriptions on File Prior to Visit  Medication Sig Dispense Refill  . 5-Hydroxytryptophan (5-HTP) 100 MG CAPS Take by mouth.      . BORON PO Take by mouth.      . Calcium-Magnesium-Vitamin D (CALCIUM 1200+D3 PO) Take by mouth.      Marland Kitchen  Digestive Enzymes (BETAINE HCL PO) Take by mouth.      . Ergocalciferol (VITAMIN D2) 2000 UNITS TABS Take by mouth.      . Evening  Primrose Oil 1000 MG CAPS Take by mouth.      . Iodine, Kelp, TABS Take by mouth.      . liothyronine (CYTOMEL) 5 MCG tablet Take 5 mcg by mouth daily.      . Magnesium 500 MG CAPS Take by mouth.      . Misc Natural Products (OSTEO BI-FLEX ADV JOINT SHIELD PO) Take by mouth.      . NON FORMULARY Ultra nutrient pure encapsulations multiple vitamin      . NON FORMULARY 5-MTHFES bioactive folate as quatrefolic      . NON FORMULARY Balance D dietary supplement      . olmesartan (BENICAR) 20 MG tablet Take 20 mg by mouth daily.      . Omega-3 Fatty Acids (FISH OIL) 1200 MG CAPS Take by mouth.      . ondansetron (ZOFRAN) 4 MG tablet Take 1 tablet (4 mg total) by mouth every 8 (eight) hours as needed for nausea.  30 tablet  0  . oxyCODONE (OXY IR/ROXICODONE) 5 MG immediate release tablet Take 5 mg by mouth every 4 (four) hours as needed for pain.      Marland Kitchen sennosides-docusate sodium (SENOKOT-S) 8.6-50 MG tablet Take 2 tablets by mouth daily.  30 tablet  1  . Taurine 1000 MG CAPS Take by mouth.      . thyroid (ARMOUR) 32.5 MG tablet Take 32.5 mg by mouth daily.      . Tyrosine 500 MG CAPS Take by mouth.      . Zinc 50 MG CAPS Take by mouth.       No current facility-administered medications on file prior to visit.    Current Problems (verified) Patient Active Problem List   Diagnosis Date Noted  . Fracture of radial head, right, closed 09/16/2012   Screening Test and Preventative care: Last colonoscopy: 2005 DUE this year Dr. Oletta Lamas Last mammogram: 07/2013 Last pap smear/pelvic exam: 2010 DEXA: 2007 DUE Stress test 2010 EF 73& normal  Prior vaccinations: TD or Tdap: 2006  Influenza: declines  Pneumococcal: 2009 Shingles/Zostavax: declines  History reviewed: allergies, current medications, past family history, past medical history, past social history, past surgical history and problem list  Risk Factors: Osteoporosis: postmenopausal estrogen deficiency and dietary calcium and/or  vitamin D deficiency History of fracture in the past year: yes  Tobacco History  Substance Use Topics  . Smoking status: Former Smoker -- 2.00 packs/day    Types: Cigarettes    Quit date: 09/16/1993  . Smokeless tobacco: Never Used  . Alcohol Use: 1.2 oz/week    2 Shots of liquor per week     Comment: 4 ounces vodka per night   She does not smoke.  Patient is a former smoker. Are there smokers in your home (other than you)?  No  Alcohol Current alcohol use: social drinker  Caffeine Current caffeine use: occ  Exercise Current exercise: walking  Nutrition/Diet Current diet: in general, a "healthy" diet    Cardiac risk factors: advanced age (older than 65 for men, 29 for women), dyslipidemia, hypertension and obesity (BMI >= 30 kg/m2).  Depression Screen (Note: if answer to either of the following is "Yes", a more complete depression screening is indicated)   Q1: Over the past two weeks, have you felt down, depressed or hopeless? No  Q2: Over the past two weeks, have you felt little interest or pleasure in doing things? No  Have you lost interest or pleasure in daily life? No  Do you often feel hopeless? No  Do you cry easily over simple problems? No  Activities of Daily Living In your present state of health, do you have any difficulty performing the following activities?:  Driving? No Managing money?  No Feeding yourself? No Getting from bed to chair? No Climbing a flight of stairs? No Preparing food and eating?: No Bathing or showering? No Getting dressed: No Getting to the toilet? No Using the toilet:No Moving around from place to place: No In the past year have you fallen or had a near fall?:Yes   Are you sexually active?  No  Do you have more than one partner?  No  Vision Difficulties: No  Hearing Difficulties: No Do you often ask people to speak up or repeat themselves? No Do you experience ringing or noises in your ears? No Do you have difficulty  understanding soft or whispered voices? No  Cognition  Do you feel that you have a problem with memory?No  Do you often misplace items? No  Do you feel safe at home?  Yes  Advanced directives Does patient have a [place]? No Does patient have a Living Will? No   Objective:     Blood pressure 132/60, pulse 60, temperature 98.7 F (37.1 C), resp. rate 16, height 5\' 4"  (1.626 m), weight 176 lb (79.833 kg). Body mass index is 30.2 kg/(m^2).  General appearance: alert, no distress, WD/WN,  female Cognitive Testing  Alert? Yes  Normal Appearance?Yes  Oriented to person? Yes  Place? Yes   Time? Yes  Recall of three objects?  Yes  Can perform simple calculations? Yes  Displays appropriate judgment?Yes  Can read the correct time from a watch face?Yes  HEENT: normocephalic, sclerae anicteric, TMs pearly, nares patent, no discharge or erythema, pharynx normal Oral cavity: MMM, no lesions Neck: supple, no lymphadenopathy, no thyromegaly, no masses Heart: RRR, normal S1, S2, no murmurs Lungs: CTA bilaterally, no wheezes, rhonchi, or rales Abdomen: +bs, soft, non tender, non distended, no masses, no hepatomegaly, no splenomegaly Musculoskeletal: nontender, no swelling, no obvious deformity Extremities: no edema, no cyanosis, no clubbing Pulses: 2+ symmetric, upper and lower extremities, normal cap refill Neurological: alert, oriented x 3, CN2-12 intact, strength normal upper extremities and lower extremities, sensation normal throughout, DTRs 2+ throughout, no cerebellar signs, gait normal Psychiatric: normal affect, behavior normal, pleasant  Breast: nontender, no masses or lumps, no skin changes, no nipple discharge or inversion, no axillary lymphadenopathy Gyn: Normal external genitalia without lesions, vagina with normal mucosa, cervix without lesions, no cervical motion tenderness, no abnormal vaginal discharge.  Uterus and adnexa not enlarged, nontender, no  masses.  Pap performed.   Rectal:   Medicare Attestation I have personally reviewed: The patient's medical and social history Their use of alcohol, tobacco or illicit drugs Their current medications and supplements The patient's functional ability including ADLs,fall risks, home safety risks, cognitive, and hearing and visual impairment Diet and physical activities Evidence for depression or mood disorders  The patient's weight, height, BMI, and visual acuity have been recorded in the chart.  I have made referrals, counseling, and provided education to the patient based on review of the above and I have provided the patient with a written personalized care plan for preventive services.     Vicie Mutters, PA-C  08/25/2013     

## 2013-08-26 LAB — URINALYSIS, ROUTINE W REFLEX MICROSCOPIC
BILIRUBIN URINE: NEGATIVE
GLUCOSE, UA: NEGATIVE mg/dL
HGB URINE DIPSTICK: NEGATIVE
Ketones, ur: NEGATIVE mg/dL
LEUKOCYTES UA: NEGATIVE
Nitrite: NEGATIVE
PH: 8 (ref 5.0–8.0)
Protein, ur: NEGATIVE mg/dL
Specific Gravity, Urine: 1.023 (ref 1.005–1.030)
Urobilinogen, UA: 0.2 mg/dL (ref 0.0–1.0)

## 2013-08-26 LAB — MICROALBUMIN / CREATININE URINE RATIO
Creatinine, Urine: 74.2 mg/dL
MICROALB/CREAT RATIO: 6.7 mg/g (ref 0.0–30.0)
Microalb, Ur: 0.5 mg/dL (ref 0.00–1.89)

## 2013-08-26 LAB — TSH: TSH: 0.386 u[IU]/mL (ref 0.350–4.500)

## 2013-08-26 LAB — VITAMIN D 25 HYDROXY (VIT D DEFICIENCY, FRACTURES): Vit D, 25-Hydroxy: 52 ng/mL (ref 30–89)

## 2013-12-20 ENCOUNTER — Other Ambulatory Visit: Payer: Self-pay | Admitting: *Deleted

## 2013-12-20 MED ORDER — NEOMYCIN-POLYMYXIN-HC 1 % OT SOLN: 6.0000 [drp] | Freq: Four times a day (QID) | OTIC | Status: DC

## 2014-01-16 ENCOUNTER — Other Ambulatory Visit: Payer: Self-pay | Admitting: Internal Medicine

## 2014-01-16 MED ORDER — MECLIZINE HCL 25 MG PO TABS: 25.0000 mg | ORAL_TABLET | Freq: Three times a day (TID) | ORAL | Status: AC | PRN

## 2014-04-21 ENCOUNTER — Encounter: Payer: Self-pay | Admitting: *Deleted

## 2014-08-02 ENCOUNTER — Other Ambulatory Visit: Payer: Self-pay | Admitting: Internal Medicine

## 2014-08-02 DIAGNOSIS — Z1231 Encounter for screening mammogram for malignant neoplasm of breast: Secondary | ICD-10-CM

## 2014-08-08 ENCOUNTER — Ambulatory Visit (HOSPITAL_COMMUNITY)
Admission: RE | Admit: 2014-08-08 | Discharge: 2014-08-08 | Disposition: A | Discharge status: Home / Self Care | Payer: Medicare Other | Source: Ambulatory Visit | Attending: Internal Medicine | Admitting: Internal Medicine

## 2014-08-08 DIAGNOSIS — Z1231 Encounter for screening mammogram for malignant neoplasm of breast: Secondary | ICD-10-CM | POA: Diagnosis present

## 2014-08-28 ENCOUNTER — Ambulatory Visit (INDEPENDENT_AMBULATORY_CARE_PROVIDER_SITE_OTHER): Payer: Medicare Other | Admitting: Physician Assistant

## 2014-08-28 ENCOUNTER — Encounter: Payer: Self-pay | Admitting: Physician Assistant

## 2014-08-28 VITALS — BP 120/68 | HR 64 | Temp 97.9°F | Resp 16 | Ht 64.0 in | Wt 178.0 lb

## 2014-08-28 DIAGNOSIS — M2550 Pain in unspecified joint: Secondary | ICD-10-CM

## 2014-08-28 DIAGNOSIS — E559 Vitamin D deficiency, unspecified: Secondary | ICD-10-CM | POA: Insufficient documentation

## 2014-08-28 DIAGNOSIS — Z23 Encounter for immunization: Secondary | ICD-10-CM

## 2014-08-28 DIAGNOSIS — E2839 Other primary ovarian failure: Secondary | ICD-10-CM

## 2014-08-28 DIAGNOSIS — Z1331 Encounter for screening for depression: Secondary | ICD-10-CM

## 2014-08-28 DIAGNOSIS — Z0001 Encounter for general adult medical examination with abnormal findings: Secondary | ICD-10-CM

## 2014-08-28 DIAGNOSIS — R6889 Other general symptoms and signs: Secondary | ICD-10-CM

## 2014-08-28 DIAGNOSIS — E039 Hypothyroidism, unspecified: Secondary | ICD-10-CM | POA: Insufficient documentation

## 2014-08-28 DIAGNOSIS — Z9181 History of falling: Secondary | ICD-10-CM

## 2014-08-28 DIAGNOSIS — M791 Myalgia, unspecified site: Secondary | ICD-10-CM

## 2014-08-28 DIAGNOSIS — E569 Vitamin deficiency, unspecified: Secondary | ICD-10-CM

## 2014-08-28 DIAGNOSIS — G43809 Other migraine, not intractable, without status migrainosus: Secondary | ICD-10-CM

## 2014-08-28 DIAGNOSIS — R030 Elevated blood-pressure reading, without diagnosis of hypertension: Secondary | ICD-10-CM

## 2014-08-28 DIAGNOSIS — E538 Deficiency of other specified B group vitamins: Secondary | ICD-10-CM

## 2014-08-28 DIAGNOSIS — Z79899 Other long term (current) drug therapy: Secondary | ICD-10-CM | POA: Insufficient documentation

## 2014-08-28 DIAGNOSIS — L719 Rosacea, unspecified: Secondary | ICD-10-CM

## 2014-08-28 DIAGNOSIS — E785 Hyperlipidemia, unspecified: Secondary | ICD-10-CM

## 2014-08-28 LAB — CBC WITH DIFFERENTIAL/PLATELET
BASOS ABS: 0.1 10*3/uL (ref 0.0–0.1)
BASOS PCT: 1 % (ref 0–1)
EOS ABS: 0.3 10*3/uL (ref 0.0–0.7)
Eosinophils Relative: 6 % — ABNORMAL HIGH (ref 0–5)
HCT: 37.9 % (ref 36.0–46.0)
Hemoglobin: 12.9 g/dL (ref 12.0–15.0)
Lymphocytes Relative: 30 % (ref 12–46)
Lymphs Abs: 1.7 10*3/uL (ref 0.7–4.0)
MCH: 30.4 pg (ref 26.0–34.0)
MCHC: 34 g/dL (ref 30.0–36.0)
MCV: 89.4 fL (ref 78.0–100.0)
MPV: 9.2 fL (ref 8.6–12.4)
Monocytes Absolute: 0.6 10*3/uL (ref 0.1–1.0)
Monocytes Relative: 10 % (ref 3–12)
NEUTROS PCT: 53 % (ref 43–77)
Neutro Abs: 3.1 10*3/uL (ref 1.7–7.7)
PLATELETS: 229 10*3/uL (ref 150–400)
RBC: 4.24 MIL/uL (ref 3.87–5.11)
RDW: 13.3 % (ref 11.5–15.5)
WBC: 5.8 10*3/uL (ref 4.0–10.5)

## 2014-08-28 LAB — SEDIMENTATION RATE: Sed Rate: 19 mm/hr (ref 0–30)

## 2014-08-28 NOTE — Progress Notes (Addendum)
Assessment:   1. Elevated blood pressure reading without diagnosis of hypertension - continue medications, DASH diet, exercise and monitor at home. Call if greater than 130/80. - CBC with Differential/Platelet - BASIC METABOLIC PANEL WITH GFR - Hepatic function panel - Urinalysis, Routine w reflex microscopic - Microalbumin / creatinine urine ratio - EKG 12-Lead - [place], RETROPERITNL ABD,  LTD  2. Hyperlipidemia -continue medications, check lipids, decrease fatty foods, increase activity.  - Lipid panel  3. Other migraine without status migrainosus, not intractable Controlled, avoid triggers  4. Rosacea Follows with Derm  5. Vitamin D deficiency - Vit D  25 hydroxy (rtn osteoporosis monitoring)  6. Medication management - Magnesium  7. Hypothyroidism, unspecified hypothyroidism type - TSH - T4, free - T3, free  8. Estrogen deficiency - DG Bone Density; Future  9. Need for prophylactic vaccination with combined diphtheria-tetanus-pertussis (DTP) vaccine - Dt vaccine greater than 7yo IM  10. Screening for depression negative  11. Joint pain ? From gout versus OA, may benefit from trial of colchicine - Uric acid  12. B12 deficiency - Vitamin B12  13. Vitamin deficiency - CK - Vitamin A  14. Myalgia - Sedimentation rate - Lyme Aby, Wstrn. Blt. IgG & IgM w/bands - Rocky mtn spotted fvr abs pnl(IgG+IgM) - Homocysteine  Addendum: Slight + RMSF, with symptoms will treat with Doxycycline, sent into the patient.    Plan:   During the course of the visit the patient was educated and counseled about appropriate screening and preventive services including:    Pneumococcal vaccine   Influenza vaccine  Td vaccine  Screening electrocardiogram  Screening mammography  Screening Pap smear and pelvic exam   Bone densitometry screening  Colorectal cancer screening  Diabetes screening  Glaucoma screening  Nutrition counseling   Advanced directives:  given information  Screening recommendations, referrals:  Vaccinations: Tdap vaccine next year  Influenza vaccine declined Pneumococcal vaccine not indicated Shingles vaccine declined Hep B vaccine not indicated  Nutrition assessed and recommended  Colonoscopy DUE- orderd Mammogram next year Pap smear not indicated Pelvic exam not indicated Recommended yearly ophthalmology/optometry visit for glaucoma screening and checkup Recommended yearly dental visit for hygiene and checkup Advanced directives - given information  Conditions/risks identified: BMI: Discussed weight loss, diet, and increase physical activity.  Increase physical activity: AHA recommends 150 minutes of physical activity a week.  Medications reviewed DEXA- ordered Urinary Incontinence is not an issue: discussed non pharmacology and pharmacology options.  Fall risk: low- discussed PT, home fall assessment, medications.   Subjective:   Daisy Oliver is a 79 y.o. female who presents for Medicare Annual Wellness Visit and complete physical.    Date of last medicare wellness visit was 08/25/2013  Her blood pressure has been controlled at home, today their BP is BP: 120/68 mmHg She does workout, she started silver sneakers and will do zumba.  She denies chest pain, shortness of breath, dizziness.  She is not on cholesterol medication and denies myalgias. Her cholesterol is at goal. The cholesterol last visit was:  Lab Results  Component Value Date   CHOL 179 08/25/2013   HDL 65 08/25/2013   LDLCALC 94 08/25/2013   TRIG 100 08/25/2013   CHOLHDL 2.8 08/25/2013   Patient is on Vitamin D supplement.   Lab Results  Component Value Date   VD25OH 52 08/25/2013   She states she has been having a intermittent rash on her neck and hip. Would like a new Derm She follows with Dr. Sharol Roussel  for alternative medicine and is asking for certain labs to be drawn, we have discussed TSH and how T3/T4 She describes a episode of  acute left 1st MTP pain [place] and has diffuse joint pain that is sometime waking her up at night.  She also has been having bilateral joint and hip pain, no weakness. She does have some right sided back pain down her leg.   Names of Other Physician/Practitioners you currently use: 1. [facility]- here for primary care 2. Dr. Pandora Leiter, eye doctor, last visit last fall, q year 3.  Dr. Jackalyn Lombard dentist, last visit q 6 months Patient Care Team: [name], MD as PCP - General (Internal Medicine) Izora Gala, MD as Consulting Physician (Otolaryngology) Laurence Spates, MD as Consulting Physician (Gastroenterology) Michel Santee, MD as Consulting Physician (Neurology) Lollie Sails, MD as Referring Physician (Internal Medicine)   Medication Review Current Outpatient Prescriptions on File Prior to Visit  Medication Sig Dispense Refill  . Calcium-Magnesium-Vitamin D (CALCIUM 1200+D3 PO) Take by mouth.    . Digestive Enzymes (BETAINE HCL PO) Take by mouth. Ortho Biotic    . Ergocalciferol (VITAMIN D2) 2000 UNITS TABS Take by mouth.    . Evening Primrose Oil 1000 MG CAPS Take by mouth.    . Iodine, Kelp, TABS Take by mouth.    . liothyronine (CYTOMEL) 5 MCG tablet Take 5 mcg by mouth daily.    . Magnesium 500 MG CAPS Take by mouth.    . meclizine (ANTIVERT) 25 MG tablet Take 1 tablet (25 mg total) by mouth 3 (three) times daily as needed for dizziness or nausea. 90 tablet 99  . NEOMYCIN-POLYMYXIN-HYDROCORTISONE (CORTISPORIN) 1 % SOLN otic solution Place 6 drops into both ears 4 (four) times daily. 10 mL 0  . NON FORMULARY Ultra nutrient pure encapsulations multiple vitamin    . NON FORMULARY 5-MTHFES bioactive folate as quatrefolic    . olmesartan (BENICAR) 20 MG tablet Take 20 mg by mouth daily.    . Omega-3 Fatty Acids (FISH OIL) 1200 MG CAPS Take by mouth.    . ondansetron (ZOFRAN) 4 MG tablet Take 1 tablet (4 mg  total) by mouth every 8 (eight) hours as needed for nausea. 30 tablet 0  . oxyCODONE (OXY IR/ROXICODONE) 5 MG immediate release tablet Take 5 mg by mouth every 4 (four) hours as needed for pain.    Marland Kitchen sennosides-docusate sodium (SENOKOT-S) 8.6-50 MG tablet Take 2 tablets by mouth daily. 30 tablet 1  . Taurine 1000 MG CAPS Take by mouth.    . thyroid (ARMOUR) 32.5 MG tablet Take 32.5 mg by mouth daily.    . Zinc 50 MG CAPS Take by mouth.     No current facility-administered medications on file prior to visit.    Current Problems (verified) Patient Active Problem List   Diagnosis Date Noted  . Elevated blood pressure reading without diagnosis of hypertension 08/25/2013  . Other and unspecified hyperlipidemia 08/25/2013  . Migraines 08/25/2013  . Rosacea 08/25/2013  . Fracture of radial head, right, closed 09/16/2012   Screening Test and Preventative care: Last colonoscopy: 11/2013 Last mammogram: 07/2014 Last pap smear/pelvic exam: 2010 DEXA: 2007 DUE, will schedule Stress test 2010 EF 73% normal US Carotid 2003 CXR 08/2013 MRA head and neck 2008  Prior vaccinations: TD or Tdap: 2006    DUE   Influenza: declines  Pneumococcal: 2009 Prevnar 13: Needs, out of in the office Shingles/Zostavax: declines  Allergies Allergies  Allergen Reactions  . Barbiturates   . Pentothal [Thiopental]   . Sulfa Antibiotics   . Wheat Bran Nausea And Vomiting   Surgical history Past Surgical History  Procedure Laterality Date  . Radial head arthroplasty Right 09/16/2012    Procedure: RADIAL HEAD ARTHROPLASTY;  Surgeon: Johnny Bridge, MD;  Location: [facility];  Service: Orthopedics;  Laterality: Right;  RADIAL HEAD REPLACEMENT     Family history None  Risk Factors: Osteoporosis: postmenopausal estrogen deficiency and dietary calcium and/or vitamin D deficiency History of fracture in the past year: yes  Tobacco History  Substance Use Topics  . Smoking status: Former  Smoker -- 2.00 packs/day for 22 years    Types: Cigarettes    Quit date: 09/17/1983  . Smokeless tobacco: Never Used  . Alcohol Use: 1.2 oz/week    2 Shots of liquor per week     Comment: 4 ounces vodka per night   She does not smoke.  Patient is a former smoker. Are there smokers in your home (other than you)?  No  Alcohol Current alcohol use: social drinker  Caffeine Current caffeine use: occ  Exercise Current exercise: walking  Nutrition/Diet Current diet: in general, a "healthy" diet    Cardiac risk factors: advanced age (older than 42 for men, 53 for women), dyslipidemia, hypertension and obesity (BMI >= 30 kg/m2).  Depression Screen (Note: if answer to either of the following is "Yes", a more complete depression screening is indicated)   Q1: Over the past two weeks, have you felt down, depressed or hopeless? No  Q2: Over the past two weeks, have you felt little interest or pleasure in doing things? No  Have you lost interest or pleasure in daily life? No  Do you often feel hopeless? No  Do you cry easily over simple problems? No  Activities of Daily Living In your present state of health, do you have any difficulty performing the following activities?:  Driving? No Managing money?  No Feeding yourself? No Getting from bed to chair? No Climbing a flight of stairs? No Preparing food and eating?: No Bathing or showering? No Getting dressed: No Getting to the toilet? No Using the toilet:No Moving around from place to place: No In the past year have you fallen or had a near fall?:No    Are you sexually active?  No  Do you have more than one partner?  No  Vision Difficulties: No  Hearing Difficulties: No Do you often ask people to speak up or repeat themselves? No Do you experience ringing or noises in your ears? No Do you have difficulty understanding soft or whispered voices? No  Cognition  Do you feel that you have a problem with memory?No  Do you often  misplace items? No  Do you feel safe at home?  Yes  Advanced directives Does patient have a [place]? Yes Does patient have a Living Will? Yes   Objective:     Blood pressure 120/68, pulse 64, temperature 97.9 F (36.6 C), resp. rate 16, height 5\' 4"  (1.626 m), weight 178 lb (80.74 kg). Body mass index is 30.54 kg/(m^2).  General appearance: alert, no distress, WD/WN,  female Cognitive Testing  Alert? Yes  Normal Appearance?Yes  Oriented to person? Yes  Place? Yes   Time? Yes  Recall of three objects?  Yes  Can perform simple calculations? Yes  Displays appropriate judgment?Yes  Can read the correct time from a watch face?Yes  HEENT:  normocephalic, sclerae anicteric, TMs pearly, nares patent, no discharge or erythema, pharynx normal Oral cavity: MMM, no lesions Neck: supple, no lymphadenopathy, no thyromegaly, no masses Heart: RRR, normal S1, S2, no murmurs Lungs: CTA bilaterally, no wheezes, rhonchi, or rales Abdomen: +bs, soft, non tender, non distended, no masses, no hepatomegaly, no splenomegaly Musculoskeletal: nontender, no swelling, no obvious deformity Extremities: no edema, no cyanosis, no clubbing Pulses: 2+ symmetric, upper and lower extremities, normal cap refill Neurological: alert, oriented x 3, CN2-12 intact, strength normal upper extremities and lower extremities, sensation normal throughout, DTRs 2+ throughout, no cerebellar signs, gait normal Psychiatric: normal affect, behavior normal, pleasant  Breast: declines Gyn: declines Rectal: declines  Medicare Attestation I have personally reviewed: The patient's medical and social history Their use of alcohol, tobacco or illicit drugs Their current medications and supplements The patient's functional ability including ADLs,fall risks, home safety risks, cognitive, and hearing and visual impairment Diet and physical activities Evidence for depression or mood disorders  The patient's  weight, height, BMI, and visual acuity have been recorded in the chart.  I have made referrals, counseling, and provided education to the patient based on review of the above and I have provided the patient with a written personalized care plan for preventive services.     Vicie Mutters, PA-C   08/28/2014

## 2014-08-28 NOTE — Addendum Note (Signed)
Addended by: Vicie Mutters R on: 08/28/2014 12:38 PM   Modules accepted: Miquel Dunn

## 2014-08-28 NOTE — Patient Instructions (Addendum)
Add ENTERIC COATED low dose 81 mg Aspirin daily OR can do every other day if you have easy bruising to protect your heart and head. As well as to reduce risk of Colon Cancer by 20 %, Skin Cancer by 26 % , Melanoma by 46% and Pancreatic cancer by 60%  We are going to check a uric acid on you and we will try you on colchicine for your joint pain. Can take 2 pills a day for 5-7 days, then one a day.   Preventive Care for Adults A healthy lifestyle and preventive care can promote health and wellness. Preventive health guidelines for women include the following key practices.  A routine yearly physical is a good way to check with your health care provider about your health and preventive screening. It is a chance to share any concerns and updates on your health and to receive a thorough exam.  Visit your dentist for a routine exam and preventive care every 6 months. Brush your teeth twice a day and floss once a day. Good oral hygiene prevents tooth decay and gum disease.  The frequency of eye exams is based on your age, health, family medical history, use of contact lenses, and other factors. Follow your health care provider's recommendations for frequency of eye exams.  Eat a healthy diet. Foods like vegetables, fruits, whole grains, low-fat dairy products, and lean protein foods contain the nutrients you need without too many calories. Decrease your intake of foods high in solid fats, added sugars, and salt. Eat the right amount of calories for you.Get information about a proper diet from your health care provider, if necessary.  Regular physical exercise is one of the most important things you can do for your health. Most adults should get at least 150 minutes of moderate-intensity exercise (any activity that increases your heart rate and causes you to sweat) each week. In addition, most adults need muscle-strengthening exercises on 2 or more days a week.  Maintain a healthy weight. The body mass  index (BMI) is a screening tool to identify possible weight problems. It provides an estimate of body fat based on height and weight. Your health care provider can find your BMI and can help you achieve or maintain a healthy weight.For adults 20 years and older:  A BMI below 18.5 is considered underweight.  A BMI of 18.5 to 24.9 is normal.  A BMI of 25 to 29.9 is considered overweight.  A BMI of 30 and above is considered obese.  Maintain normal blood lipids and cholesterol levels by exercising and minimizing your intake of saturated fat. Eat a balanced diet with plenty of fruit and vegetables. If your lipid or cholesterol levels are high, you are over 50, or you are at high risk for heart disease, you may need your cholesterol levels checked more frequently.Ongoing high lipid and cholesterol levels should be treated with medicines if diet and exercise are not working.  If you smoke, find out from your health care provider how to quit. If you do not use tobacco, do not start.  Lung cancer screening is recommended for adults aged 13-80 years who are at high risk for developing lung cancer because of a history of smoking. A yearly low-dose CT scan of the lungs is recommended for people who have at least a 30-pack-year history of smoking and are a current smoker or have quit within the past 15 years. A pack year of smoking is smoking an average of 1 pack of  cigarettes a day for 1 year (for example: 1 pack a day for 30 years or 2 packs a day for 15 years). Yearly screening should continue until the smoker has stopped smoking for at least 15 years. Yearly screening should be stopped for people who develop a health problem that would prevent them from having lung cancer treatment.  Avoid use of street drugs. Do not share needles with anyone. Ask for help if you need support or instructions about stopping the use of drugs.  High blood pressure causes heart disease and increases the risk of stroke.   Ongoing high blood pressure should be treated with medicines if weight loss and exercise do not work.  If you are 58-34 years old, ask your health care provider if you should take aspirin to prevent strokes.  Diabetes screening involves taking a blood sample to check your fasting blood sugar level. This should be done once every 3 years, after age 33, if you are within normal weight and without risk factors for diabetes. Testing should be considered at a younger age or be carried out more frequently if you are overweight and have at least 1 risk factor for diabetes.  Breast cancer screening is essential preventive care for women. You should practice "breast self-awareness." This means understanding the normal appearance and feel of your breasts and may include breast self-examination. Any changes detected, no matter how small, should be reported to a health care provider. Women in their 24s and 30s should have a clinical breast exam (CBE) by a health care provider as part of a regular health exam every 1 to 3 years. After age 8, women should have a CBE every year. Starting at age 66, women should consider having a mammogram (breast X-ray test) every year. Women who have a family history of breast cancer should talk to their health care provider about genetic screening. Women at a high risk of breast cancer should talk to their health care providers about having an MRI and a mammogram every year.  Breast cancer gene (BRCA)-related cancer risk assessment is recommended for women who have family members with BRCA-related cancers. BRCA-related cancers include breast, ovarian, tubal, and peritoneal cancers. Having family members with these cancers may be associated with an increased risk for harmful changes (mutations) in the breast cancer genes BRCA1 and BRCA2. Results of the assessment will determine the need for genetic counseling and BRCA1 and BRCA2 testing.  Routine pelvic exams to screen for cancer are  no longer recommended for nonpregnant women who are considered low risk for cancer of the pelvic organs (ovaries, uterus, and vagina) and who do not have symptoms. Ask your health care provider if a screening pelvic exam is right for you.  If you have had past treatment for cervical cancer or a condition that could lead to cancer, you need Pap tests and screening for cancer for at least 20 years after your treatment. If Pap tests have been discontinued, your risk factors (such as having a new sexual partner) need to be reassessed to determine if screening should be resumed. Some women have medical problems that increase the chance of getting cervical cancer. In these cases, your health care provider may recommend more frequent screening and Pap tests.    Colorectal cancer can be detected and often prevented. Most routine colorectal cancer screening begins at the age of 59 years and continues through age 82 years. However, your health care provider may recommend screening at an earlier age if you have  risk factors for colon cancer. On a yearly basis, your health care provider may provide home test kits to check for hidden blood in the stool. Use of a small camera at the end of a tube, to directly examine the colon (sigmoidoscopy or colonoscopy), can detect the earliest forms of colorectal cancer. Talk to your health care provider about this at age 63, when routine screening begins. Direct exam of the colon should be repeated every 5-10 years through age 83 years, unless early forms of pre-cancerous polyps or small growths are found.  Osteoporosis is a disease in which the bones lose minerals and strength with aging. This can result in serious bone fractures or breaks. The risk of osteoporosis can be identified using a bone density scan. Women ages 46 years and over and women at risk for fractures or osteoporosis should discuss screening with their health care providers. Ask your health care provider whether  you should take a calcium supplement or vitamin D to reduce the rate of osteoporosis.  Menopause can be associated with physical symptoms and risks. Hormone replacement therapy is available to decrease symptoms and risks. You should talk to your health care provider about whether hormone replacement therapy is right for you.  Use sunscreen. Apply sunscreen liberally and repeatedly throughout the day. You should seek shade when your shadow is shorter than you. Protect yourself by wearing long sleeves, pants, a wide-brimmed hat, and sunglasses year round, whenever you are outdoors.  Once a month, do a whole body skin exam, using a mirror to look at the skin on your back. Tell your health care provider of new moles, moles that have irregular borders, moles that are larger than a pencil eraser, or moles that have changed in shape or color.  Stay current with required vaccines (immunizations).  Influenza vaccine. All adults should be immunized every year.  Tetanus, diphtheria, and acellular pertussis (Td, Tdap) vaccine. Pregnant women should receive 1 dose of Tdap vaccine during each pregnancy. The dose should be obtained regardless of the length of time since the last dose. Immunization is preferred during the 27th-36th week of gestation. An adult who has not previously received Tdap or who does not know her vaccine status should receive 1 dose of Tdap. This initial dose should be followed by tetanus and diphtheria toxoids (Td) booster doses every 10 years. Adults with an unknown or incomplete history of completing a 3-dose immunization series with Td-containing vaccines should begin or complete a primary immunization series including a Tdap dose. Adults should receive a Td booster every 10 years.    Zoster vaccine. One dose is recommended for adults aged 61 years or older unless certain conditions are present.    Pneumococcal 13-valent conjugate (PCV13) vaccine. When indicated, a person who is  uncertain of her immunization history and has no record of immunization should receive the PCV13 vaccine. An adult aged 86 years or older who has certain medical conditions and has not been previously immunized should receive 1 dose of PCV13 vaccine. This PCV13 should be followed with a dose of pneumococcal polysaccharide (PPSV23) vaccine. The PPSV23 vaccine dose should be obtained at least 8 weeks after the dose of PCV13 vaccine. An adult aged 83 years or older who has certain medical conditions and previously received 1 or more doses of PPSV23 vaccine should receive 1 dose of PCV13. The PCV13 vaccine dose should be obtained 1 or more years after the last PPSV23 vaccine dose.    Pneumococcal polysaccharide (PPSV23) vaccine. When  PCV13 is also indicated, PCV13 should be obtained first. All adults aged 79 years and older should be immunized. An adult younger than age 47 years who has certain medical conditions should be immunized. Any person who resides in a nursing home or long-term care facility should be immunized. An adult smoker should be immunized. People with an immunocompromised condition and certain other conditions should receive both PCV13 and PPSV23 vaccines. People with human immunodeficiency virus (HIV) infection should be immunized as soon as possible after diagnosis. Immunization during chemotherapy or radiation therapy should be avoided. Routine use of PPSV23 vaccine is not recommended for American Indians, [place] Natives, or people younger than 65 years unless there are medical conditions that require PPSV23 vaccine. When indicated, people who have unknown immunization and have no record of immunization should receive PPSV23 vaccine. One-time revaccination 5 years after the first dose of PPSV23 is recommended for people aged 19-64 years who have chronic kidney failure, nephrotic syndrome, asplenia, or immunocompromised conditions. People who received 1-2 doses of PPSV23 before age 15 years  should receive another dose of PPSV23 vaccine at age 26 years or later if at least 5 years have passed since the previous dose. Doses of PPSV23 are not needed for people immunized with PPSV23 at or after age 32 years.   Preventive Services / Frequency  Ages 37 years and over  Blood pressure check.  Lipid and cholesterol check.  Lung cancer screening. / Every year if you are aged 32-80 years and have a 30-pack-year history of smoking and currently smoke or have quit within the past 15 years. Yearly screening is stopped once you have quit smoking for at least 15 years or develop a health problem that would prevent you from having lung cancer treatment.  Clinical breast exam.** / Every year after age 33 years.  BRCA-related cancer risk assessment.** / For women who have family members with a BRCA-related cancer (breast, ovarian, tubal, or peritoneal cancers).  Mammogram.** / Every year beginning at age 69 years and continuing for as long as you are in good health. Consult with your health care provider.  Pap test.** / Every 3 years starting at age 28 years through age 69 or 20 years with 3 consecutive normal Pap tests. Testing can be stopped between 65 and 70 years with 3 consecutive normal Pap tests and no abnormal Pap or HPV tests in the past 10 years.  Fecal occult blood test (FOBT) of stool. / Every year beginning at age 62 years and continuing until age 3 years. You may not need to do this test if you get a colonoscopy every 10 years.  Flexible sigmoidoscopy or colonoscopy.** / Every 5 years for a flexible sigmoidoscopy or every 10 years for a colonoscopy beginning at age 71 years and continuing until age 45 years.  Hepatitis C blood test.** / For all people born from [birth year] through 1963 and any individual with known risks for hepatitis C.  Osteoporosis screening.** / A one-time screening for women ages 36 years and over and women at risk for fractures or osteoporosis.  Skin self-exam.  / Monthly.  Influenza vaccine. / Every year.  Tetanus, diphtheria, and acellular pertussis (Tdap/Td) vaccine.** / 1 dose of Td every 10 years.  Zoster vaccine.** / 1 dose for adults aged 48 years or older.  Pneumococcal 13-valent conjugate (PCV13) vaccine.** / Consult your health care provider.  Pneumococcal polysaccharide (PPSV23) vaccine.** / 1 dose for all adults aged 109 years and older. Screening for abdominal aortic  aneurysm (AAA)  by ultrasound is recommended for people who have history of high blood pressure or who are current or former smokers.

## 2014-08-29 LAB — MAGNESIUM: Magnesium: 2.2 mg/dL (ref 1.5–2.5)

## 2014-08-29 LAB — URINALYSIS, ROUTINE W REFLEX MICROSCOPIC
Bilirubin Urine: NEGATIVE
GLUCOSE, UA: NEGATIVE mg/dL
HGB URINE DIPSTICK: NEGATIVE
Ketones, ur: NEGATIVE mg/dL
Nitrite: NEGATIVE
Protein, ur: NEGATIVE mg/dL
Specific Gravity, Urine: 1.014 (ref 1.005–1.030)
Urobilinogen, UA: 0.2 mg/dL (ref 0.0–1.0)
pH: 5.5 (ref 5.0–8.0)

## 2014-08-29 LAB — LIPID PANEL
Cholesterol: 148 mg/dL (ref 0–200)
HDL: 55 mg/dL (ref >=46)
LDL Cholesterol: 77 mg/dL (ref 0–99)
Total CHOL/HDL Ratio: 2.7 Ratio
Triglycerides: 78 mg/dL (ref <150)
VLDL: 16 mg/dL (ref 0–40)

## 2014-08-29 LAB — BASIC METABOLIC PANEL WITH GFR
BUN: 23 mg/dL (ref 6–23)
CALCIUM: 9.6 mg/dL (ref 8.4–10.5)
CHLORIDE: 100 meq/L (ref 96–112)
CO2: 31 meq/L (ref 19–32)
Creat: 0.73 mg/dL (ref 0.50–1.10)
GFR, Est African American: >89 mL/min
GFR, Est Non African American: 80 mL/min
GLUCOSE: 80 mg/dL (ref 70–99)
POTASSIUM: 4.8 meq/L (ref 3.5–5.3)
SODIUM: 138 meq/L (ref 135–145)

## 2014-08-29 LAB — TSH: TSH: 1.117 u[IU]/mL (ref 0.350–4.500)

## 2014-08-29 LAB — HEPATIC FUNCTION PANEL
ALK PHOS: 57 U/L (ref 39–117)
ALT: 17 U/L (ref 0–35)
AST: 25 U/L (ref 0–37)
Albumin: 4.1 g/dL (ref 3.5–5.2)
BILIRUBIN DIRECT: 0.2 mg/dL (ref 0.0–0.3)
BILIRUBIN INDIRECT: 0.8 mg/dL (ref 0.2–1.2)
TOTAL PROTEIN: 7.3 g/dL (ref 6.0–8.3)
Total Bilirubin: 1 mg/dL (ref 0.2–1.2)

## 2014-08-29 LAB — VITAMIN B12: VITAMIN B 12: 1224 pg/mL — AB (ref 211–911)

## 2014-08-29 LAB — URIC ACID: Uric Acid, Serum: 4.8 mg/dL (ref 2.4–7.0)

## 2014-08-29 LAB — T4, FREE: Free T4: 0.44 ng/dL — ABNORMAL LOW (ref 0.80–1.80)

## 2014-08-29 LAB — MICROALBUMIN / CREATININE URINE RATIO
CREATININE, URINE: 94.9 mg/dL
Microalb Creat Ratio: 8.4 mg/g (ref 0.0–30.0)
Microalb, Ur: 0.8 mg/dL (ref <2.0)

## 2014-08-29 LAB — CK: Total CK: 56 U/L (ref 7–177)

## 2014-08-29 LAB — VITAMIN D 25 HYDROXY (VIT D DEFICIENCY, FRACTURES): Vit D, 25-Hydroxy: 44 ng/mL (ref 30–100)

## 2014-08-29 LAB — ROCKY MTN SPOTTED FVR ABS PNL(IGG+IGM)
RMSF IGG: 1.23 IV — AB
RMSF IGM: 0.15 IV

## 2014-08-29 LAB — URINALYSIS, MICROSCOPIC ONLY
Bacteria, UA: NONE SEEN
CASTS: NONE SEEN
Crystals: NONE SEEN
Squamous Epithelial / LPF: NONE SEEN

## 2014-08-29 LAB — T3, FREE: T3 FREE: 4.1 pg/mL (ref 2.3–4.2)

## 2014-08-29 LAB — HOMOCYSTEINE: HOMOCYSTEINE: 9.1 umol/L (ref 4.0–15.4)

## 2014-08-30 LAB — LYME ABY, WSTRN BLT IGG & IGM W/BANDS

## 2014-08-30 MED ORDER — DOXYCYCLINE HYCLATE 100 MG PO TABS: 100.0000 mg | ORAL_TABLET | Freq: Two times a day (BID) | ORAL | Status: DC

## 2014-08-30 NOTE — Addendum Note (Signed)
Addended by: Vicie Mutters R on: 08/30/2014 01:53 PM   Modules accepted: Orders, SmartSet

## 2014-08-31 LAB — VITAMIN A: Vitamin A (Retinoic Acid): 57 ug/dL (ref 38–98)

## 2014-09-13 ENCOUNTER — Encounter: Payer: Self-pay | Admitting: Internal Medicine

## 2014-10-08 ENCOUNTER — Other Ambulatory Visit: Payer: Self-pay

## 2014-10-29 ENCOUNTER — Other Ambulatory Visit: Payer: Self-pay | Admitting: Internal Medicine

## 2014-10-29 DIAGNOSIS — T63481A Toxic effect of venom of other arthropod, accidental (unintentional), initial encounter: Secondary | ICD-10-CM

## 2014-10-29 MED ORDER — PREDNISONE 20 MG PO TABS: ORAL_TABLET | ORAL | Status: AC

## 2015-07-31 ENCOUNTER — Other Ambulatory Visit: Payer: Self-pay

## 2015-07-31 DIAGNOSIS — Z1231 Encounter for screening mammogram for malignant neoplasm of breast: Secondary | ICD-10-CM

## 2015-08-21 ENCOUNTER — Ambulatory Visit
Admission: RE | Admit: 2015-08-21 | Discharge: 2015-08-21 | Disposition: A | Discharge status: Home / Self Care | Payer: Medicare Other | Source: Ambulatory Visit

## 2015-08-21 DIAGNOSIS — Z1231 Encounter for screening mammogram for malignant neoplasm of breast: Secondary | ICD-10-CM

## 2015-08-28 ENCOUNTER — Encounter: Payer: Self-pay | Admitting: Physician Assistant

## 2015-08-28 ENCOUNTER — Ambulatory Visit (INDEPENDENT_AMBULATORY_CARE_PROVIDER_SITE_OTHER): Payer: Medicare Other | Admitting: Physician Assistant

## 2015-08-28 VITALS — BP 126/78 | HR 61 | Temp 97.5°F | Resp 16 | Ht 64.5 in | Wt 168.2 lb

## 2015-08-28 DIAGNOSIS — E785 Hyperlipidemia, unspecified: Secondary | ICD-10-CM

## 2015-08-28 DIAGNOSIS — Z1389 Encounter for screening for other disorder: Secondary | ICD-10-CM

## 2015-08-28 DIAGNOSIS — Z0000 Encounter for general adult medical examination without abnormal findings: Secondary | ICD-10-CM

## 2015-08-28 DIAGNOSIS — E559 Vitamin D deficiency, unspecified: Secondary | ICD-10-CM

## 2015-08-28 DIAGNOSIS — Z23 Encounter for immunization: Secondary | ICD-10-CM | POA: Diagnosis not present

## 2015-08-28 DIAGNOSIS — G43809 Other migraine, not intractable, without status migrainosus: Secondary | ICD-10-CM

## 2015-08-28 DIAGNOSIS — Z136 Encounter for screening for cardiovascular disorders: Secondary | ICD-10-CM

## 2015-08-28 DIAGNOSIS — Z79899 Other long term (current) drug therapy: Secondary | ICD-10-CM

## 2015-08-28 DIAGNOSIS — R030 Elevated blood-pressure reading, without diagnosis of hypertension: Secondary | ICD-10-CM

## 2015-08-28 DIAGNOSIS — L719 Rosacea, unspecified: Secondary | ICD-10-CM

## 2015-08-28 DIAGNOSIS — Z0001 Encounter for general adult medical examination with abnormal findings: Secondary | ICD-10-CM

## 2015-08-28 DIAGNOSIS — E039 Hypothyroidism, unspecified: Secondary | ICD-10-CM

## 2015-08-28 LAB — HEPATIC FUNCTION PANEL
ALBUMIN: 4.2 g/dL (ref 3.6–5.1)
ALT: 19 U/L (ref 6–29)
AST: 26 U/L (ref 10–35)
Alkaline Phosphatase: 54 U/L (ref 33–130)
Bilirubin, Direct: 0.2 mg/dL (ref <=0.2)
Indirect Bilirubin: 0.5 mg/dL (ref 0.2–1.2)
TOTAL PROTEIN: 7.5 g/dL (ref 6.1–8.1)
Total Bilirubin: 0.7 mg/dL (ref 0.2–1.2)

## 2015-08-28 LAB — BASIC METABOLIC PANEL WITH GFR
BUN: 18 mg/dL (ref 7–25)
CALCIUM: 9.9 mg/dL (ref 8.6–10.4)
CO2: 30 mmol/L (ref 20–31)
Chloride: 100 mmol/L (ref 98–110)
Creat: 0.78 mg/dL (ref 0.60–0.93)
GFR, EST AFRICAN AMERICAN: 85 mL/min (ref >=60)
GFR, Est Non African American: 74 mL/min (ref >=60)
GLUCOSE: 79 mg/dL (ref 65–99)
Potassium: 4.7 mmol/L (ref 3.5–5.3)
Sodium: 139 mmol/L (ref 135–146)

## 2015-08-28 LAB — CBC WITH DIFFERENTIAL/PLATELET
Basophils Absolute: 67 cells/uL (ref 0–200)
Basophils Relative: 1 %
EOS PCT: 5 %
Eosinophils Absolute: 335 cells/uL (ref 15–500)
HCT: 39 % (ref 35.0–45.0)
HEMOGLOBIN: 13.2 g/dL (ref 11.7–15.5)
Lymphocytes Relative: 30 %
Lymphs Abs: 2010 cells/uL (ref 850–3900)
MCH: 30.8 pg (ref 27.0–33.0)
MCHC: 33.8 g/dL (ref 32.0–36.0)
MCV: 90.9 fL (ref 80.0–100.0)
MPV: 9.5 fL (ref 7.5–12.5)
Monocytes Absolute: 737 cells/uL (ref 200–950)
Monocytes Relative: 11 %
NEUTROS PCT: 53 %
Neutro Abs: 3551 cells/uL (ref 1500–7800)
Platelets: 222 10*3/uL (ref 140–400)
RBC: 4.29 MIL/uL (ref 3.80–5.10)
RDW: 13.6 % (ref 11.0–15.0)
WBC: 6.7 10*3/uL (ref 3.8–10.8)

## 2015-08-28 LAB — LIPID PANEL
CHOLESTEROL: 161 mg/dL (ref 125–200)
HDL: 62 mg/dL (ref >=46)
LDL Cholesterol: 82 mg/dL (ref <130)
TRIGLYCERIDES: 83 mg/dL (ref <150)
Total CHOL/HDL Ratio: 2.6 Ratio (ref <=5.0)
VLDL: 17 mg/dL (ref <30)

## 2015-08-28 LAB — TSH: TSH: 0.92 m[IU]/L

## 2015-08-28 LAB — MAGNESIUM: Magnesium: 2.3 mg/dL (ref 1.5–2.5)

## 2015-08-28 NOTE — Progress Notes (Signed)
Complete physical Assessment:   1. Elevated blood pressure reading without diagnosis of hypertension - continue medications, DASH diet, exercise and monitor at home. Call if greater than 130/80. - CBC with Differential/Platelet - BASIC METABOLIC PANEL WITH GFR - Hepatic function panel - Urinalysis, Routine w reflex microscopic - Microalbumin / creatinine urine ratio - EKG 12-Lead  2. Hyperlipidemia -continue medications, check lipids, decrease fatty foods, increase activity.  - Lipid panel  3. Other migraine without status migrainosus, not intractable Controlled, avoid triggers  4. Rosacea Follows with Derm  5. Vitamin D deficiency - Vit D  25 hydroxy (rtn osteoporosis monitoring)  6. Medication management - Magnesium  7. Hypothyroidism, unspecified hypothyroidism type - TSH  8. Estrogen deficiency - DG Bone Density; Future  9. Need for prophylactic vaccination for prevnar 13    Subjective:   Daisy Oliver is a 80 y.o. female who presents for complete physical.   Her blood pressure has been controlled at home, today their BP is BP: 126/78 mmHg She does workout, she started silver sneakers and will do zumba.  She denies chest pain, shortness of breath, dizziness.  Has had some lower back pain in Feb, has seen Dr. Karin Lieu, states she is doing better.  She is not on cholesterol medication and denies myalgias. Her cholesterol is at goal. The cholesterol last visit was:  Lab Results  Component Value Date   CHOL 148 08/28/2014   HDL 55 08/28/2014   LDLCALC 77 08/28/2014   TRIG 78 08/28/2014   CHOLHDL 2.7 08/28/2014   Patient is on Vitamin D supplement.   Lab Results  Component Value Date   VD25OH 44 08/28/2014   She follows with Derm.  She follows with Dr. Sharol Roussel for alternative medicine, on B1 and metanex now.  States she has not been having much myalgias since her visit to [facility] with her daughter.   Names of Other Physician/Practitioners you currently use: 1.  [facility]- here for primary care 2. Dr. Pandora Leiter, eye doctor, last visit last Sept 2016 3.  Dr. Jackalyn Lombard dentist, last visit q 6 months Patient Care Team: [name], MD as PCP - General (Internal Medicine) Izora Gala, MD as Consulting Physician (Otolaryngology) Laurence Spates, MD as Consulting Physician (Gastroenterology) Michel Santee, MD as Consulting Physician (Neurology) Lollie Sails, MD as Referring Physician (Internal Medicine)   Medication Review Current Outpatient Prescriptions on File Prior to Visit  Medication Sig Dispense Refill  . BIOTIN PO Take by mouth daily.    . Calcium-Magnesium-Vitamin D (CALCIUM 1200+D3 PO) Take by mouth.    . Digestive Enzymes (BETAINE HCL PO) Take by mouth. Ortho Biotic    . DiphenhydrAMINE HCl (ALLERGY MED PO) Take by mouth. [facility] - generic allergy med    . Ergocalciferol (VITAMIN D2) 2000 UNITS TABS Take by mouth.    . Evening Primrose Oil 1000 MG CAPS Take by mouth.    . Iodine, Kelp, TABS Take by mouth.    . liothyronine (CYTOMEL) 5 MCG tablet Take 5 mcg by mouth daily.    . Magnesium 500 MG CAPS Take by mouth.    . MELOXICAM PO Take by mouth daily as needed.    . Misc Natural Products (OSTEO BI-FLEX JOINT SHIELD PO) Take by mouth. Osteo Bi-Flex    . NEOMYCIN-POLYMYXIN-HYDROCORTISONE (CORTISPORIN) 1 % SOLN otic solution Place 6 drops into both ears 4 (four) times daily. 10 mL 0  . olmesartan (BENICAR) 20 MG tablet Take 20 mg by mouth  daily.    . Omega-3 Fatty Acids (FISH OIL PO) Take by mouth daily. Fish Oil    . OVER THE COUNTER MEDICATION daily. P 5 P50    . oxyCODONE (OXY IR/ROXICODONE) 5 MG immediate release tablet Take 5 mg by mouth every 4 (four) hours as needed for pain.    Marland Kitchen sennosides-docusate sodium (SENOKOT-S) 8.6-50 MG tablet Take 2 tablets by mouth daily. 30 tablet 1  . Taurine 1000 MG CAPS Take by mouth.    . Zinc 50 MG CAPS Take by mouth.     No current facility-administered  medications on file prior to visit.    Current Problems (verified) Patient Active Problem List   Diagnosis Date Noted  . Vitamin D deficiency 08/28/2014  . Medication management 08/28/2014  . Hypothyroidism 08/28/2014  . Elevated blood pressure reading without diagnosis of hypertension 08/25/2013  . Hyperlipidemia 08/25/2013  . Migraines 08/25/2013  . Rosacea 08/25/2013   Screening Test and Preventative care:  Immunization History  Administered Date(s) Administered  . DT 08/28/2014   Last colonoscopy: 11/2013 Last mammogram: 08/2015 Last pap smear/pelvic exam: 2010 DEXA: 2007 DUE, will schedule Stress test 2010 EF 73% normal US Carotid 2003 CXR 08/2013 MRA head and neck 2008  Prior vaccinations: TD or Tdap: 2016  Influenza: declines  Pneumococcal: 2009 Prevnar 13: DUE Shingles/Zostavax: declines  Allergies Allergies  Allergen Reactions  . Barbiturates   . Pentothal [Thiopental]   . Sulfa Antibiotics   . Wheat Bran Nausea And Vomiting   Surgical history Past Surgical History  Procedure Laterality Date  . 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 Smoking status: Former Smoker -- 2.00 packs/day for 22 years    Types: Cigarettes    Quit date: 09/17/1983  . Smokeless tobacco: Never Used  . Alcohol Use: 1.2 oz/week    2 Shots of liquor per week     Comment: 4 ounces vodka per night    Objective:     Blood pressure 126/78, pulse 61, temperature 97.5 F (36.4 C), temperature source Temporal, resp. rate 16, height 5' 4.5" (1.638 m), weight 168 lb 3.2 oz (76.295 kg), SpO2 98 %. Body mass index is 28.44 kg/(m^2).  General appearance: alert, no distress, WD/WN,  female HEENT: normocephalic, sclerae anicteric, TMs  pearly, nares patent, no discharge or erythema, pharynx normal Oral cavity: MMM, no lesions Neck: supple, no lymphadenopathy, no thyromegaly, no masses Heart: RRR, normal S1, S2, no murmurs Lungs: CTA bilaterally, no wheezes, rhonchi, or rales Abdomen: +bs, soft, non tender, non distended, no masses, no hepatomegaly, no splenomegaly Musculoskeletal: nontender, no swelling, no obvious deformity Extremities: no edema, no cyanosis, no clubbing Pulses: 2+ symmetric, upper and lower extremities, normal cap refill Neurological: alert, oriented x 3, CN2-12 intact, strength normal upper extremities and lower extremities, sensation normal throughout, DTRs 2+ throughout, no cerebellar signs, gait normal Psychiatric: normal affect, behavior normal, pleasant  Breast: declines Gyn: declines Rectal: declines   Vicie Mutters, PA-C   08/28/2015

## 2015-08-29 LAB — MICROALBUMIN / CREATININE URINE RATIO
CREATININE, URINE: 76 mg/dL (ref 20–320)
Microalb Creat Ratio: 4 mcg/mg creat (ref <30)
Microalb, Ur: 0.3 mg/dL

## 2015-08-29 LAB — URINALYSIS, ROUTINE W REFLEX MICROSCOPIC
BILIRUBIN URINE: NEGATIVE
GLUCOSE, UA: NEGATIVE
Hgb urine dipstick: NEGATIVE
Ketones, ur: NEGATIVE
Leukocytes, UA: NEGATIVE
Nitrite: NEGATIVE
Protein, ur: NEGATIVE
SPECIFIC GRAVITY, URINE: 1.022 (ref 1.001–1.035)
pH: 7 (ref 5.0–8.0)

## 2015-08-29 LAB — VITAMIN D 25 HYDROXY (VIT D DEFICIENCY, FRACTURES): VIT D 25 HYDROXY: 61 ng/mL (ref 30–100)

## 2015-12-26 ENCOUNTER — Telehealth: Payer: Self-pay | Admitting: Physician Assistant

## 2015-12-26 DIAGNOSIS — E2839 Other primary ovarian failure: Secondary | ICD-10-CM

## 2015-12-26 NOTE — Telephone Encounter (Signed)
-----   Message from Oliver Pila sent at 12/26/2015  1:23 PM EDT ----- Regarding: DEXA order requested by patient. previous exp 2016 Estill Bamberg,  Please enter a DEXA order.  Requested by patient. previous order exp 2016 Thanks Katrina  My conversation with Zyria below.  [place]   I have put a request in for Estill Bamberg to enter the order for the DEXA. I will schedule it as soon as I receive the order.  Thank you, Leonie Douglas Referral Coordinator  [facility] [phone] ext. [phone] Fax [phone]

## 2016-01-06 ENCOUNTER — Other Ambulatory Visit: Payer: Medicare Other

## 2016-01-09 ENCOUNTER — Ambulatory Visit
Admission: RE | Admit: 2016-01-09 | Discharge: 2016-01-09 | Disposition: A | Discharge status: Home / Self Care | Payer: Medicare Other | Source: Ambulatory Visit | Attending: Physician Assistant | Admitting: Physician Assistant

## 2016-01-09 DIAGNOSIS — E2839 Other primary ovarian failure: Secondary | ICD-10-CM

## 2016-03-02 ENCOUNTER — Ambulatory Visit (INDEPENDENT_AMBULATORY_CARE_PROVIDER_SITE_OTHER): Payer: Medicare Other | Admitting: Physician Assistant

## 2016-03-02 ENCOUNTER — Encounter: Payer: Self-pay | Admitting: Physician Assistant

## 2016-03-02 ENCOUNTER — Ambulatory Visit: Payer: Self-pay | Admitting: Internal Medicine

## 2016-03-02 VITALS — BP 134/68 | HR 72 | Temp 98.2°F | Resp 18 | Ht 64.5 in | Wt 173.0 lb

## 2016-03-02 DIAGNOSIS — R030 Elevated blood-pressure reading, without diagnosis of hypertension: Secondary | ICD-10-CM | POA: Diagnosis not present

## 2016-03-02 DIAGNOSIS — E559 Vitamin D deficiency, unspecified: Secondary | ICD-10-CM

## 2016-03-02 DIAGNOSIS — Z79899 Other long term (current) drug therapy: Secondary | ICD-10-CM

## 2016-03-02 DIAGNOSIS — E039 Hypothyroidism, unspecified: Secondary | ICD-10-CM | POA: Diagnosis not present

## 2016-03-02 DIAGNOSIS — L719 Rosacea, unspecified: Secondary | ICD-10-CM

## 2016-03-02 DIAGNOSIS — R6889 Other general symptoms and signs: Secondary | ICD-10-CM

## 2016-03-02 DIAGNOSIS — E785 Hyperlipidemia, unspecified: Secondary | ICD-10-CM | POA: Diagnosis not present

## 2016-03-02 DIAGNOSIS — G43809 Other migraine, not intractable, without status migrainosus: Secondary | ICD-10-CM

## 2016-03-02 DIAGNOSIS — Z0001 Encounter for general adult medical examination with abnormal findings: Secondary | ICD-10-CM | POA: Diagnosis not present

## 2016-03-02 DIAGNOSIS — Z0000 Encounter for general adult medical examination without abnormal findings: Secondary | ICD-10-CM

## 2016-03-02 NOTE — Progress Notes (Signed)
Medicare Wellness Visit  Assessment:    Elevated blood pressure reading without diagnosis of hypertension - continue medications, DASH diet, exercise and monitor at home. Call if greater than 130/80. - CBC with Differential/Platelet - BASIC METABOLIC PANEL WITH GFR - Hepatic function panel  Hyperlipidemia -continue medications, check lipids, decrease fatty foods, increase activity.   Other migraine without status migrainosus, not intractable  Rosacea Follows derm   Vitamin D deficiency  Medication management   Hypothyroidism, unspecified hypothyroidism type Hypothyroidism-check TSH level, continue medications the same, reminded to take on an empty stomach 30-72mins before food.    Osteoporosis Repeat DEXA 2 years   B12 deficiency  Vitamin deficiency Increase to 4000 IU daily  Plan:   During the course of the visit the patient was educated and counseled about appropriate screening and preventive services including:    Pneumococcal vaccine   Influenza vaccine  Td vaccine  Screening electrocardiogram  Screening mammography  Screening Pap smear and pelvic exam   Bone densitometry screening  Colorectal cancer screening  Diabetes screening  Glaucoma screening  Nutrition counseling   Advanced directives: given information   Subjective:   Daisy Oliver is a 80 y.o. female who presents for Medicare Annual Wellness Visit and  6 month follow up   Her blood pressure has been controlled at home, today their BP is BP: 134/68 She does workout, she started silver sneakers and will do zumba 1 hour 3 days a week and weight lifting.  She denies chest pain, shortness of breath, dizziness.  She is not on cholesterol medication and denies myalgias. Her cholesterol is at goal. The cholesterol last visit was:  Lab Results  Component Value Date   CHOL 161 08/28/2015   HDL 62 08/28/2015   LDLCALC 82 08/28/2015   TRIG 83 08/28/2015   CHOLHDL 2.6 08/28/2015    Patient is on Vitamin D supplement.   Lab Results  Component Value Date   VD25OH 31 08/28/2015   She follows with Dr. Sharol Roussel for alternative medicine and is asking for certain labs to be drawn, we have discussed TSH and how T3/T4 BMI is Body mass index is 29.24 kg/m., she is working on diet and exercise. Wt Readings from Last 3 Encounters:  03/02/16 173 lb (78.5 kg)  08/28/15 168 lb 3.2 oz (76.3 kg)  08/28/14 178 lb (80.7 kg)    Names of Other Physician/Practitioners you currently use: 1. [facility]- here for primary care 2. Dr. Herbert Deaner, eye doctor, last visit 01/2016, q year 3.  Dr. Jackalyn Lombard dentist, last visit q 6 months Patient Care Team: [name], MD as PCP - General (Internal Medicine) Izora Gala, MD as Consulting Physician (Otolaryngology) Laurence Spates, MD as Consulting Physician (Gastroenterology) Michel Santee, MD as Consulting Physician (Neurology) Lollie Sails, MD as Referring Physician (Internal Medicine)   Medication Review Current Outpatient Prescriptions on File Prior to Visit  Medication Sig Dispense Refill  . BIOTIN PO Take by mouth daily.    . Calcium-Magnesium-Vitamin D (CALCIUM 1200+D3 PO) Take by mouth.    . Digestive Enzymes (BETAINE HCL PO) Take by mouth. Ortho Biotic    . DiphenhydrAMINE HCl (ALLERGY MED PO) Take by mouth. [facility] - generic allergy med    . Ergocalciferol (VITAMIN D2) 2000 UNITS TABS Take by mouth.    . Evening Primrose Oil 1000 MG CAPS Take by mouth.    . Iodine, Kelp, TABS Take by mouth.    Marland Kitchen L-Methylfolate-Algae-B12-B6 (METANX PO) Take by  mouth daily.    Marland Kitchen liothyronine (CYTOMEL) 5 MCG tablet Take 5 mcg by mouth daily.    . Magnesium 500 MG CAPS Take by mouth.    . MELOXICAM PO Take by mouth daily as needed.    . Misc Natural Products (OSTEO BI-FLEX JOINT SHIELD PO) Take by mouth. Osteo Bi-Flex    . NEOMYCIN-POLYMYXIN-HYDROCORTISONE (CORTISPORIN) 1 % SOLN otic solution Place 6  drops into both ears 4 (four) times daily. 10 mL 0  . olmesartan (BENICAR) 20 MG tablet Take 20 mg by mouth daily.    . Omega-3 Fatty Acids (FISH OIL PO) Take by mouth daily. Fish Oil    . OVER THE COUNTER MEDICATION daily. P 5 P50    . oxyCODONE (OXY IR/ROXICODONE) 5 MG immediate release tablet Take 5 mg by mouth every 4 (four) hours as needed for pain.    Marland Kitchen sennosides-docusate sodium (SENOKOT-S) 8.6-50 MG tablet Take 2 tablets by mouth daily. 30 tablet 1  . Taurine 1000 MG CAPS Take by mouth.    . Zinc 50 MG CAPS Take by mouth.     No current facility-administered medications on file prior to visit.     Current Problems (verified) Patient Active Problem List   Diagnosis Date Noted  . Vitamin D deficiency 08/28/2014  . Medication management 08/28/2014  . Hypothyroidism 08/28/2014  . Elevated blood pressure reading without diagnosis of hypertension 08/25/2013  . Hyperlipidemia 08/25/2013  . Migraines 08/25/2013  . Rosacea 08/25/2013   Screening Test and Preventative care: Immunization History  Administered Date(s) Administered  . DT 08/28/2014  . Pneumococcal Conjugate-13 08/28/2015   Last colonoscopy: 11/2013 Last mammogram: 08/2015 Last pap smear/pelvic exam: 2010 DEXA: 2017 Stress test 2010 EF 73% normal US Carotid 2003 CXR 08/2013 MRA head and neck 2008  Prior vaccinations: TD or Tdap:2016  Influenza: declines  Pneumococcal: 2009 Prevnar 13: 2017 Shingles/Zostavax: declines  Allergies Allergies  Allergen Reactions  . Barbiturates   . Pentothal [Thiopental]   . Sulfa Antibiotics   . Wheat Bran Nausea And Vomiting    SURGICAL HISTORY She  has a past surgical history that includes Radial head arthroplasty (Right, 09/16/2012). FAMILY HISTORY Her family history is not on file. SOCIAL HISTORY She  reports that she quit smoking about 32 years ago. Her smoking use included Cigarettes. She has a 44.00 pack-year smoking history. She has never used smokeless tobacco.  She reports that she drinks about 1.2 oz of alcohol per week . She reports that she does not use drugs.  MEDICARE WELLNESS OBJECTIVES: Physical activity: Current Exercise Habits: Home exercise routine;Structured exercise class, Type of exercise: strength training/weights;stretching (zumba), Time (Minutes): 20, Frequency (Times/Week): 4, Weekly Exercise (Minutes/Week): 80, Intensity: Moderate Cardiac risk factors: Cardiac Risk Factors include: advanced age (>64men, >57 women);dyslipidemia Depression/mood screen:   Depression screen [facility] 2/9 03/02/2016  Decreased Interest 0  Down, Depressed, Hopeless 0  PHQ - 2 Score 0    ADLs:  In your present state of health, do you have any difficulty performing the following activities: 03/02/2016  Hearing? N  Vision? N  Difficulty concentrating or making decisions? N  Walking or climbing stairs? N  Dressing or bathing? N  Doing errands, shopping? N  Some recent data might be hidden     Cognitive Testing  Alert? Yes  Normal Appearance?Yes  Oriented to person? Yes  Place? Yes   Time? Yes  Recall of three objects?  Yes  Can perform simple calculations? Yes  Displays appropriate judgment?Yes  Can  read the correct time from a watch face?Yes  EOL planning: Does patient have an advance directive?: Yes Type of Advance Directive: Healthcare Power of [place], Living will [name]) Does patient want to make changes to advanced directive?: No - Patient declined Copy of advanced directive(s) in chart?: No - copy requested   Objective:     Blood pressure 134/68, pulse 72, temperature 98.2 F (36.8 C), temperature source Temporal, resp. rate 18, height 5' 4.5" (1.638 m), weight 173 lb (78.5 kg). Body mass index is 29.24 kg/m.  General appearance: alert, no distress, WD/WN,  female HEENT: normocephalic, sclerae anicteric, TMs pearly, nares patent, no discharge or erythema, pharynx normal Oral cavity: MMM, no lesions Neck: supple, no lymphadenopathy,  no thyromegaly, no masses Heart: RRR, normal S1, S2, no murmurs Lungs: CTA bilaterally, no wheezes, rhonchi, or rales Abdomen: +bs, soft, non tender, non distended, no masses, no hepatomegaly, no splenomegaly Musculoskeletal: nontender, no swelling, no obvious deformity Extremities: no edema, no cyanosis, no clubbing Pulses: 2+ symmetric, upper and lower extremities, normal cap refill Neurological: alert, oriented x 3, CN2-12 intact, strength normal upper extremities and lower extremities, sensation normal throughout, DTRs 2+ throughout, no cerebellar signs, gait normal Psychiatric: normal affect, behavior normal, pleasant  Breast: declines Gyn: declines Rectal: declines  Medicare Attestation I have personally reviewed: The patient's medical and social history Their use of alcohol, tobacco or illicit drugs Their current medications and supplements The patient's functional ability including ADLs,fall risks, home safety risks, cognitive, and hearing and visual impairment Diet and physical activities Evidence for depression or mood disorders  The patient's weight, height, BMI, and visual acuity have been recorded in the chart.  I have made referrals, counseling, and provided education to the patient based on review of the above and I have provided the patient with a written personalized care plan for preventive services.     Vicie Mutters, PA-C   03/02/2016

## 2016-03-04 ENCOUNTER — Encounter: Payer: Self-pay | Admitting: Physician Assistant

## 2016-06-08 ENCOUNTER — Encounter: Payer: Self-pay | Admitting: Physician Assistant

## 2016-06-08 MED ORDER — OLMESARTAN MEDOXOMIL 40 MG PO TABS: ORAL_TABLET | ORAL | 0 refills | Status: DC

## 2016-07-06 ENCOUNTER — Encounter: Payer: Self-pay | Admitting: Physician Assistant

## 2016-07-07 ENCOUNTER — Other Ambulatory Visit: Payer: Self-pay | Admitting: Physician Assistant

## 2016-07-09 ENCOUNTER — Other Ambulatory Visit: Payer: Self-pay | Admitting: Internal Medicine

## 2016-07-09 DIAGNOSIS — Z1231 Encounter for screening mammogram for malignant neoplasm of breast: Secondary | ICD-10-CM

## 2016-08-21 ENCOUNTER — Ambulatory Visit
Admission: RE | Admit: 2016-08-21 | Discharge: 2016-08-21 | Disposition: A | Discharge status: Home / Self Care | Payer: Medicare Other | Source: Ambulatory Visit | Attending: Internal Medicine | Admitting: Internal Medicine

## 2016-08-21 DIAGNOSIS — Z1231 Encounter for screening mammogram for malignant neoplasm of breast: Secondary | ICD-10-CM

## 2016-08-30 NOTE — Progress Notes (Signed)
Complete physical Assessment:    Encounter for general adult medical examination with abnormal findings  Other migraine without status migrainosus, not intractable Avoid triggers, controlled -     Zinc -     Iodine, Serum/Plasma  Hypothyroidism, unspecified type Hypothyroidism-check TSH level, continue medications the same, reminded to take on an empty stomach 30-76mins before food.  -     liothyronine (CYTOMEL) 5 MCG tablet; Take 2 tablets (10 mcg total) by mouth 2 (two) times daily. -     TSH -     T3 -     T4  Rosacea Continue follow up derm -     clotrimazole-betamethasone (LOTRISONE) cream; Apply 1 application topically 2 (two) times daily. -     tretinoin (RETIN-A) 0.05 % cream; Apply topically at bedtime.  Elevated blood pressure reading without diagnosis of hypertension - continue medications, DASH diet, exercise and monitor at home. Call if greater than 130/80.  -     olmesartan (BENICAR) 40 MG tablet; Take 1 tablet (40 mg total) by mouth daily. -     Urinalysis, Routine w reflex microscopic -     Urine culture -     EKG 12-Lead  Hyperlipidemia, unspecified hyperlipidemia type -     Lipid panel -continue medications, check lipids, decrease fatty foods, increase activity.   Vitamin D deficiency -     VITAMIN D 25 Hydroxy (Vit-D Deficiency, Fractures)  Medication management -     CBC with Differential/Platelet -     BASIC METABOLIC PANEL WITH GFR -     Hepatic function panel -     Magnesium -     Zinc -     Iodine, Serum/Plasma  Anemia, unspecified type -     Iron and TIBC -     Ferritin -     Vitamin B12 -     Folate RBC  Estrogen deficiency dexa due next year  Osteoporosis, unspecified osteoporosis type, unspecified pathological fracture presence Declines foxamax at this time, continue vitamin D  Other orders -     scopolamine (TRANSDERM-SCOP, 1.5 MG,) 1 MG/3DAYS; Place 1 patch (1.5 mg total) onto the skin every 3 (three) days.   Future  Appointments Date Time Provider [place]  08/31/2017 9:00 AM Vicie Mutters, PA-C [id number] None     Subjective:   Daisy Oliver is a 81 y.o. female who presents for complete physical.   Her blood pressure has been controlled at home, today their BP is BP: 132/80 She does workout, she does silver sneakers and will do zumba 3 days a week.  She denies chest pain, shortness of breath, dizziness.  Has had some lower back pain, has seen Dr. Karin Lieu, in the past.  Going on cruise with her daughter to [place].  She has had rash perianal that clotrimazole helped, would like a refill.  Had some dizziness, took allergy pills that helped.  She is not on cholesterol medication and denies myalgias. Her cholesterol is at goal. The cholesterol last visit was:  Lab Results  Component Value Date   CHOL 161 08/28/2015   HDL 62 08/28/2015   LDLCALC 82 08/28/2015   TRIG 83 08/28/2015   CHOLHDL 2.6 08/28/2015   Patient is on Vitamin D supplement.   Lab Results  Component Value Date   VD25OH 61 08/28/2015   She follows with Derm.  She follows with Dr. Sharol Roussel for alternative medicine, on B1 and metanex now.  States she has not been having much myalgias  since her visit to [facility] with her daughter.   Names of Other Physician/Practitioners you currently use: 1. [facility]- here for primary care 2. Dr. Pandora Leiter, eye doctor, last visit last Sept 2016 3.  Dr. Jackalyn Lombard dentist, last visit q 6 months Patient Care Team: [name], MD as PCP - General (Internal Medicine) Izora Gala, MD as Consulting Physician (Otolaryngology) Laurence Spates, MD as Consulting Physician (Gastroenterology) Michel Santee, MD as Consulting Physician (Neurology) Lollie Sails, MD as Referring Physician (Internal Medicine)   Medication Review Current Outpatient Prescriptions on File Prior to Visit  Medication Sig Dispense Refill  . BIOTIN PO Take by mouth daily.     . Boron 3 MG CAPS Take by mouth daily.    . Calcium-Magnesium-Vitamin D (CALCIUM 1200+D3 PO) Take by mouth.    . Cyanocobalamin (VITAMIN B 12 PO) Take by mouth daily.    . Digestive Enzymes (BETAINE HCL PO) Take by mouth. Ortho Biotic    . DiphenhydrAMINE HCl (ALLERGY MED PO) Take by mouth. [facility] - generic allergy med    . Ergocalciferol (VITAMIN D2) 2000 UNITS TABS Take by mouth.    . Evening Primrose Oil 1000 MG CAPS Take by mouth.    . Iodine, Kelp, TABS Take by mouth.    Marland Kitchen L-Methylfolate-Algae-B12-B6 (METANX PO) Take by mouth daily.    Marland Kitchen liothyronine (CYTOMEL) 5 MCG tablet Take 5 mcg by mouth daily.    . Magnesium 500 MG CAPS Take by mouth.    . Misc Natural Products (OSTEO BI-FLEX JOINT SHIELD PO) Take by mouth. Osteo Bi-Flex    . NEOMYCIN-POLYMYXIN-HYDROCORTISONE (CORTISPORIN) 1 % SOLN otic solution Place 6 drops into both ears 4 (four) times daily. 10 mL 0  . olmesartan (BENICAR) 40 MG tablet TAKE 1/2-1 TABLET DAILY FOR BLOOD PRESSURE 30 tablet 0  . Omega-3 Fatty Acids (FISH OIL PO) Take by mouth daily. Fish Oil    . OVER THE COUNTER MEDICATION daily. P 5 P50    . oxyCODONE (OXY IR/ROXICODONE) 5 MG immediate release tablet Take 5 mg by mouth every 4 (four) hours as needed for pain.    Marland Kitchen sennosides-docusate sodium (SENOKOT-S) 8.6-50 MG tablet Take 2 tablets by mouth daily. 30 tablet 1  . Taurine 1000 MG CAPS Take by mouth.    . Zinc 50 MG CAPS Take by mouth.     No current facility-administered medications on file prior to visit.     Current Problems (verified) Patient Active Problem List   Diagnosis Date Noted  . Vitamin D deficiency 08/28/2014  . Medication management 08/28/2014  . Hypothyroidism 08/28/2014  . Elevated blood pressure reading without diagnosis of hypertension 08/25/2013  . Hyperlipidemia 08/25/2013  . Migraines 08/25/2013  . Rosacea 08/25/2013   Screening Test and Preventative care:  Immunization History  Administered Date(s) Administered  . DT  08/28/2014  . Pneumococcal Conjugate-13 08/28/2015   Last colonoscopy: 11/2013 will be last one Last mammogram: 08/2016 Last pap smear/pelvic exam: 2010 defer DEXA: 2017 + osteoporosis t -2.6 Stress test 2010 EF 73% normal US Carotid 2003 CXR 08/2013 MRA head and neck 2008  Prior vaccinations: TD or Tdap: 2016  Influenza: declines  Pneumococcal: 2009 Prevnar 13: 2017 Shingles/Zostavax: declines  Allergies Allergies  Allergen Reactions  . Barbiturates   . Pentothal [Thiopental]   . Sulfa Antibiotics   . Wheat Bran Nausea And Vomiting    SURGICAL HISTORY She  has a past surgical history that includes Radial head arthroplasty (Right, 09/16/2012).  FAMILY HISTORY Her family history is not on file. SOCIAL HISTORY She  reports that she quit smoking about 32 years ago. Her smoking use included Cigarettes. She has a 44.00 pack-year smoking history. She has never used smokeless tobacco. She reports that she drinks about 1.2 oz of alcohol per week . She reports that she does not use drugs.   Objective:     Blood pressure 132/80, pulse 82, temperature 97.3 F (36.3 C), resp. rate 14, height 5\' 4"  (1.626 m), weight 172 lb (78 kg), SpO2 96 %. Body mass index is 29.52 kg/m.  General appearance: alert, no distress, WD/WN,  female HEENT: normocephalic, sclerae anicteric, TMs pearly, nares patent, no discharge or erythema, pharynx normal Oral cavity: MMM, no lesions Neck: supple, no lymphadenopathy, no thyromegaly, no masses Heart: RRR, normal S1, S2, no murmurs Lungs: CTA bilaterally, no wheezes, rhonchi, or rales Abdomen: +bs, soft, non tender, non distended, no masses, no hepatomegaly, no splenomegaly Musculoskeletal: nontender, no swelling, no obvious deformity Extremities: no edema, no cyanosis, no clubbing Pulses: 2+ symmetric, upper and lower extremities, normal cap refill Neurological: alert, oriented x 3, CN2-12 intact, strength normal upper extremities and lower  extremities, sensation normal throughout, DTRs 2+ throughout, no cerebellar signs, gait normal Skin: sparse eyebrows bilaterally Psychiatric: normal affect, behavior normal, pleasant  Breast: declines Gyn: declines Rectal: declines   Vicie Mutters, PA-C   09/01/2016

## 2016-09-01 ENCOUNTER — Encounter: Payer: Self-pay | Admitting: Physician Assistant

## 2016-09-01 ENCOUNTER — Ambulatory Visit (INDEPENDENT_AMBULATORY_CARE_PROVIDER_SITE_OTHER): Payer: Medicare Other | Admitting: Physician Assistant

## 2016-09-01 VITALS — BP 132/80 | HR 82 | Temp 97.3°F | Resp 14 | Ht 64.0 in | Wt 172.0 lb

## 2016-09-01 DIAGNOSIS — E785 Hyperlipidemia, unspecified: Secondary | ICD-10-CM | POA: Diagnosis not present

## 2016-09-01 DIAGNOSIS — Z79899 Other long term (current) drug therapy: Secondary | ICD-10-CM | POA: Diagnosis not present

## 2016-09-01 DIAGNOSIS — E2839 Other primary ovarian failure: Secondary | ICD-10-CM

## 2016-09-01 DIAGNOSIS — E039 Hypothyroidism, unspecified: Secondary | ICD-10-CM

## 2016-09-01 DIAGNOSIS — L719 Rosacea, unspecified: Secondary | ICD-10-CM | POA: Diagnosis not present

## 2016-09-01 DIAGNOSIS — R030 Elevated blood-pressure reading, without diagnosis of hypertension: Secondary | ICD-10-CM

## 2016-09-01 DIAGNOSIS — M810 Age-related osteoporosis without current pathological fracture: Secondary | ICD-10-CM | POA: Diagnosis not present

## 2016-09-01 DIAGNOSIS — R6889 Other general symptoms and signs: Secondary | ICD-10-CM | POA: Diagnosis not present

## 2016-09-01 DIAGNOSIS — Z0001 Encounter for general adult medical examination with abnormal findings: Secondary | ICD-10-CM | POA: Diagnosis not present

## 2016-09-01 DIAGNOSIS — D649 Anemia, unspecified: Secondary | ICD-10-CM

## 2016-09-01 DIAGNOSIS — G43809 Other migraine, not intractable, without status migrainosus: Secondary | ICD-10-CM

## 2016-09-01 DIAGNOSIS — E559 Vitamin D deficiency, unspecified: Secondary | ICD-10-CM

## 2016-09-01 DIAGNOSIS — Z136 Encounter for screening for cardiovascular disorders: Secondary | ICD-10-CM | POA: Diagnosis not present

## 2016-09-01 LAB — CBC WITH DIFFERENTIAL/PLATELET
BASOS ABS: 71 {cells}/uL (ref 0–200)
Basophils Relative: 1 %
EOS ABS: 213 {cells}/uL (ref 15–500)
EOS PCT: 3 %
HEMATOCRIT: 39.8 % (ref 35.0–45.0)
HEMOGLOBIN: 13.3 g/dL (ref 11.7–15.5)
LYMPHS ABS: 2059 {cells}/uL (ref 850–3900)
Lymphocytes Relative: 29 %
MCH: 30 pg (ref 27.0–33.0)
MCHC: 33.4 g/dL (ref 32.0–36.0)
MCV: 89.6 fL (ref 80.0–100.0)
MONO ABS: 639 {cells}/uL (ref 200–950)
MPV: 9.2 fL (ref 7.5–12.5)
Monocytes Relative: 9 %
NEUTROS ABS: 4118 {cells}/uL (ref 1500–7800)
NEUTROS PCT: 58 %
Platelets: 203 10*3/uL (ref 140–400)
RBC: 4.44 MIL/uL (ref 3.80–5.10)
RDW: 14 % (ref 11.0–15.0)
WBC: 7.1 10*3/uL (ref 3.8–10.8)

## 2016-09-01 MED ORDER — LIOTHYRONINE SODIUM 5 MCG PO TABS: 10.0000 ug | ORAL_TABLET | Freq: Two times a day (BID) | ORAL | 3 refills | Status: DC

## 2016-09-01 MED ORDER — SCOPOLAMINE 1 MG/3DAYS TD PT72: 1.0000 | MEDICATED_PATCH | TRANSDERMAL | 0 refills | Status: DC

## 2016-09-01 MED ORDER — TRETINOIN 0.05 % EX CREA
TOPICAL_CREAM | Freq: Every day | CUTANEOUS | 0 refills | Status: DC
Start: 2016-09-01 — End: 2017-09-14

## 2016-09-01 MED ORDER — OLMESARTAN MEDOXOMIL 40 MG PO TABS: 40.0000 mg | ORAL_TABLET | Freq: Every day | ORAL | 1 refills | Status: DC

## 2016-09-01 MED ORDER — CLOTRIMAZOLE-BETAMETHASONE 1-0.05 % EX CREA: 1.0000 "application " | TOPICAL_CREAM | Freq: Two times a day (BID) | CUTANEOUS | 2 refills | Status: DC

## 2016-09-01 NOTE — Patient Instructions (Addendum)
If you are traveling you can take these medications to be more prepared. If you get chest pain, shortness of breath or abdominal pain please go to the hospital wherever you may be.   Ciprofloxacin is good for travelers diarrhea, you can take 2 pills a day for 7 days. Or it is also good for urinary tract infections, you can take 2 a day for 7 days.  Zpak- is good for sinus infections please finish as prescribed Phenergran is for nausea but it sedating so plan on eating and sleeping.  Levsin is good for nausea, diarrhea, or abdominal cramping- it can constipate you so don't take too much. This dissolves under your tongue.  Prednisone is good for joint pain or rashes or spider bites- you can take it as prescribed but if you are feeling better you can stop it early.  Diflucan is for a yeast infection.   Your ears and sinuses are connected by the eustachian tube. When your sinuses are inflamed, this can close off the tube and cause fluid to collect in your middle ear. This can then cause dizziness, popping, clicking, ringing, and echoing in your ears. This is often NOT an infection and does NOT require antibiotics, it is caused by inflammation so the treatments help the inflammation. This can take a long time to get better so please be patient.  Here are things you can do to help with this: - Try the Flonase or Nasonex. Remember to spray each nostril twice towards the outer part of your eye.  Do not sniff but instead pinch your nose and tilt your head back to help the medicine get into your sinuses.  The best time to do this is at bedtime.Stop if you get blurred vision or nose bleeds.  -While drinking fluids, pinch and hold nose close and swallow, to help open eustachian tubes to drain fluid behind ear drums. -Please pick one of the over the counter allergy medications below and take it once daily for allergies.  It will also help with fluid behind ear drums. Claritin or loratadine cheapest but likely the  weakest  Zyrtec or certizine at night because it can make you sleepy The strongest is allegra or fexafinadine  Cheapest at [facility], [employer], [employer] -can use decongestant over the counter, please do not use if you have high blood pressure or certain heart conditions.   if worsening HA, changes vision/speech, imbalance, weakness go to the ER   Osteoporosis Osteoporosis is the thinning and loss of density in the bones. Osteoporosis makes the bones more brittle, fragile, and likely to break (fracture). Over time, osteoporosis can cause the bones to become so weak that they fracture after a simple fall. The bones most likely to fracture are the bones in the hip, wrist, and spine. What are the causes? The exact cause is not known. What increases the risk? Anyone can develop osteoporosis. You may be at greater risk if you have a family history of the condition or have poor nutrition. You may also have a higher risk if you are:  Female.  74 years old or older.  A smoker.  Not physically active.  White or Asian.  Slender. What are the signs or symptoms? A fracture might be the first sign of the disease, especially if it results from a fall or injury that would not usually cause a bone to break. Other signs and symptoms include:  Low back and neck pain.  Stooped posture.  Height loss. How is this  diagnosed? To make a diagnosis, your health care provider may:  Take a medical history.  Perform a physical exam.  Order tests, such as:  A bone mineral density test.  A dual-energy X-ray absorptiometry test. How is this treated? The goal of osteoporosis treatment is to strengthen your bones to reduce your risk of a fracture. Treatment may involve:  Making lifestyle changes, such as:  Eating a diet rich in calcium.  Doing weight-bearing and muscle-strengthening exercises.  Stopping tobacco use.  Limiting alcohol intake.  Taking medicine to slow the process of bone loss or to  increase bone density.  Monitoring your levels of calcium and vitamin D. Follow these instructions at home:  Include calcium and vitamin D in your diet. Calcium is important for bone health, and vitamin D helps the body absorb calcium.  Perform weight-bearing and muscle-strengthening exercises as directed by your health care provider.  Do not use any tobacco products, including cigarettes, chewing tobacco, and electronic cigarettes. If you need help quitting, ask your health care provider.  Limit your alcohol intake.  Take medicines only as directed by your health care provider.  Keep all follow-up visits as directed by your health care provider. This is important.  Take precautions at home to lower your risk of falling, such as:  Keeping rooms well lit and clutter free.  Installing safety rails on stairs.  Using rubber mats in the bathroom and other areas that are often wet or slippery. Get help right away if: You fall or injure yourself. This information is not intended to replace advice given to you by your health care provider. Make sure you discuss any questions you have with your health care provider. Document Released: 01/07/2005 Document Revised: 09/02/2015 Document Reviewed: 09/07/2013 Elsevier Interactive Patient Education  2017 [employer].  Alendronate tablets What is this medicine? ALENDRONATE (a LEN droe nate) slows calcium loss from bones. It helps to make normal healthy bone and to slow bone loss in people with Paget's disease and osteoporosis. It may be used in others at risk for bone loss. This medicine may be used for other purposes; ask your health care provider or pharmacist if you have questions. COMMON BRAND NAME(S): Fosamax What should I tell my health care provider before I take this medicine? They need to know if you have any of these conditions: -dental disease -esophagus, stomach, or intestine problems, like acid reflux or GERD -kidney  disease -low blood calcium -low vitamin D -problems sitting or standing 30 minutes -trouble swallowing -an unusual or allergic reaction to alendronate, other medicines, foods, dyes, or preservatives -pregnant or trying to get pregnant -breast-feeding How should I use this medicine? You must take this medicine exactly as directed or you will lower the amount of the medicine you absorb into your body or you may cause yourself harm. Take this medicine by mouth first thing in the morning, after you are up for the day. Do not eat or drink anything before you take your medicine. Swallow the tablet with a full glass (6 to 8 fluid ounces) of plain water. Do not take this medicine with any other drink. Do not chew or crush the tablet. After taking this medicine, do not eat breakfast, drink, or take any medicines or vitamins for at least 30 minutes. Sit or stand up for at least 30 minutes after you take this medicine; do not lie down. Do not take your medicine more often than directed. Talk to your pediatrician regarding the use of this  medicine in children. Special care may be needed. Overdosage: If you think you have taken too much of this medicine contact a poison control center or emergency room at once. NOTE: This medicine is only for you. Do not share this medicine with others. What if I miss a dose? If you miss a dose, do not take it later in the day. Continue your normal schedule starting the next morning. Do not take double or extra doses. What may interact with this medicine? -aluminum hydroxide -antacids -aspirin -calcium supplements -drugs for inflammation like ibuprofen, naproxen, and others -iron supplements -magnesium supplements -vitamins with minerals This list may not describe all possible interactions. Give your health care provider a list of all the medicines, herbs, non-prescription drugs, or dietary supplements you use. Also tell them if you smoke, drink alcohol, or use illegal  drugs. Some items may interact with your medicine. What should I watch for while using this medicine? Visit your doctor or health care professional for regular checks ups. It may be some time before you see benefit from this medicine. Do not stop taking your medicine except on your doctor's advice. Your doctor or health care professional may order blood tests and other tests to see how you are doing. You should make sure you get enough calcium and vitamin D while you are taking this medicine, unless your doctor tells you not to. Discuss the foods you eat and the vitamins you take with your health care professional. Some people who take this medicine have severe bone, joint, and/or muscle pain. This medicine may also increase your risk for a broken thigh bone. Tell your doctor right away if you have pain in your upper leg or groin. Tell your doctor if you have any pain that does not go away or that gets worse. This medicine can make you more sensitive to the sun. If you get a rash while taking this medicine, sunlight may cause the rash to get worse. Keep out of the sun. If you cannot avoid being in the sun, wear protective clothing and use sunscreen. Do not use sun lamps or tanning beds/booths. What side effects may I notice from receiving this medicine? Side effects that you should report to your doctor or health care professional as soon as possible: -allergic reactions like skin rash, itching or hives, swelling of the face, lips, or tongue -black or tarry stools -bone, muscle or joint pain -changes in vision -chest pain -heartburn or stomach pain -jaw pain, especially after dental work -pain or trouble when swallowing -redness, blistering, peeling or loosening of the skin, including inside the mouth Side effects that usually do not require medical attention (report to your doctor or health care professional if they continue or are bothersome): -changes in taste -diarrhea or constipation -eye  pain or itching -headache -nausea or vomiting -stomach gas or fullness This list may not describe all possible side effects. Call your doctor for medical advice about side effects. You may report side effects to FDA at 1-800-FDA-1088. Where should I keep my medicine? Keep out of the reach of children. Store at room temperature of 15 and 30 degrees C (59 and 86 degrees F). Throw away any unused medicine after the expiration date. NOTE: This sheet is a summary. It may not cover all possible information. If you have questions about this medicine, talk to your doctor, pharmacist, or health care provider.  2018 Elsevier/Gold Standard (2010-09-26 08:56:09)

## 2016-09-02 LAB — BASIC METABOLIC PANEL WITH GFR
BUN: 23 mg/dL (ref 7–25)
CHLORIDE: 102 mmol/L (ref 98–110)
CO2: 28 mmol/L (ref 20–31)
CREATININE: 0.71 mg/dL (ref 0.60–0.93)
Calcium: 9.7 mg/dL (ref 8.6–10.4)
GFR, Est African American: >89 mL/min (ref >=60)
GFR, Est Non African American: 82 mL/min (ref >=60)
GLUCOSE: 88 mg/dL (ref 65–99)
Potassium: 4.1 mmol/L (ref 3.5–5.3)
SODIUM: 137 mmol/L (ref 135–146)

## 2016-09-02 LAB — URINALYSIS, ROUTINE W REFLEX MICROSCOPIC
BILIRUBIN URINE: NEGATIVE
GLUCOSE, UA: NEGATIVE
HGB URINE DIPSTICK: NEGATIVE
Ketones, ur: NEGATIVE
LEUKOCYTES UA: NEGATIVE
Nitrite: NEGATIVE
PROTEIN: NEGATIVE
Specific Gravity, Urine: 1.018 (ref 1.001–1.035)
pH: 6.5 (ref 5.0–8.0)

## 2016-09-02 LAB — IRON AND TIBC
%SAT: 29 % (ref 11–50)
Iron: 96 ug/dL (ref 45–160)
TIBC: 336 ug/dL (ref 250–450)
UIBC: 240 ug/dL

## 2016-09-02 LAB — LIPID PANEL
CHOL/HDL RATIO: 2.8 ratio (ref <5.0)
Cholesterol: 182 mg/dL (ref <200)
HDL: 65 mg/dL (ref >50)
LDL Cholesterol: 98 mg/dL (ref <100)
Triglycerides: 96 mg/dL (ref <150)
VLDL: 19 mg/dL (ref <30)

## 2016-09-02 LAB — FOLATE RBC: RBC Folate: 1150 ng/mL (ref >280)

## 2016-09-02 LAB — HEPATIC FUNCTION PANEL
ALT: 21 U/L (ref 6–29)
AST: 27 U/L (ref 10–35)
Albumin: 4.3 g/dL (ref 3.6–5.1)
Alkaline Phosphatase: 56 U/L (ref 33–130)
BILIRUBIN DIRECT: 0.1 mg/dL (ref <=0.2)
Indirect Bilirubin: 0.6 mg/dL (ref 0.2–1.2)
TOTAL PROTEIN: 7.3 g/dL (ref 6.1–8.1)
Total Bilirubin: 0.7 mg/dL (ref 0.2–1.2)

## 2016-09-02 LAB — MAGNESIUM: MAGNESIUM: 2.1 mg/dL (ref 1.5–2.5)

## 2016-09-02 LAB — TSH: TSH: 1.22 m[IU]/L

## 2016-09-02 LAB — T4: T4 TOTAL: 3.2 ug/dL — AB (ref 4.5–12.0)

## 2016-09-02 LAB — FERRITIN: Ferritin: 42 ng/mL (ref 20–288)

## 2016-09-02 LAB — VITAMIN B12: Vitamin B-12: >2000 pg/mL — ABNORMAL HIGH (ref 200–1100)

## 2016-09-03 ENCOUNTER — Encounter: Payer: Self-pay | Admitting: Physician Assistant

## 2016-09-03 DIAGNOSIS — R030 Elevated blood-pressure reading, without diagnosis of hypertension: Secondary | ICD-10-CM

## 2016-09-03 LAB — T3: T3, Total: 156 ng/dL (ref 76–181)

## 2016-09-03 LAB — VITAMIN D 25 HYDROXY (VIT D DEFICIENCY, FRACTURES): VIT D 25 HYDROXY: 54 ng/mL (ref 30–100)

## 2016-09-03 LAB — ZINC: ZINC: 80 ug/dL (ref 60–130)

## 2016-09-03 LAB — IODINE, SERUM/PLASMA: Iodine: 780 mcg/L — ABNORMAL HIGH (ref 52–109)

## 2016-09-03 MED ORDER — OLMESARTAN MEDOXOMIL 40 MG PO TABS: 40.0000 mg | ORAL_TABLET | Freq: Every day | ORAL | 1 refills | Status: DC

## 2016-09-11 ENCOUNTER — Encounter: Payer: Self-pay | Admitting: Physician Assistant

## 2017-02-15 LAB — HM DIABETES EYE EXAM

## 2017-02-19 ENCOUNTER — Encounter: Payer: Self-pay | Admitting: Physician Assistant

## 2017-03-02 ENCOUNTER — Encounter: Payer: Self-pay | Admitting: *Deleted

## 2017-03-08 ENCOUNTER — Encounter: Payer: Self-pay | Admitting: Physician Assistant

## 2017-03-08 ENCOUNTER — Ambulatory Visit: Payer: Medicare Other | Admitting: Physician Assistant

## 2017-03-08 VITALS — BP 124/80 | HR 63 | Temp 97.4°F | Resp 14 | Ht 64.0 in | Wt 177.0 lb

## 2017-03-08 DIAGNOSIS — R030 Elevated blood-pressure reading, without diagnosis of hypertension: Secondary | ICD-10-CM

## 2017-03-08 DIAGNOSIS — Z0000 Encounter for general adult medical examination without abnormal findings: Secondary | ICD-10-CM

## 2017-03-08 DIAGNOSIS — E559 Vitamin D deficiency, unspecified: Secondary | ICD-10-CM

## 2017-03-08 DIAGNOSIS — L719 Rosacea, unspecified: Secondary | ICD-10-CM | POA: Diagnosis not present

## 2017-03-08 DIAGNOSIS — R6889 Other general symptoms and signs: Secondary | ICD-10-CM

## 2017-03-08 DIAGNOSIS — E785 Hyperlipidemia, unspecified: Secondary | ICD-10-CM

## 2017-03-08 DIAGNOSIS — M810 Age-related osteoporosis without current pathological fracture: Secondary | ICD-10-CM | POA: Diagnosis not present

## 2017-03-08 DIAGNOSIS — Z0001 Encounter for general adult medical examination with abnormal findings: Secondary | ICD-10-CM

## 2017-03-08 DIAGNOSIS — E039 Hypothyroidism, unspecified: Secondary | ICD-10-CM

## 2017-03-08 DIAGNOSIS — G43809 Other migraine, not intractable, without status migrainosus: Secondary | ICD-10-CM | POA: Diagnosis not present

## 2017-03-08 DIAGNOSIS — Z6830 Body mass index (BMI) 30.0-30.9, adult: Secondary | ICD-10-CM | POA: Diagnosis not present

## 2017-03-08 DIAGNOSIS — Z79899 Other long term (current) drug therapy: Secondary | ICD-10-CM | POA: Diagnosis not present

## 2017-03-08 DIAGNOSIS — Z23 Encounter for immunization: Secondary | ICD-10-CM

## 2017-03-08 NOTE — Progress Notes (Signed)
MEDICARE WELLNESS  Assessment:   Other migraine without status migrainosus, not intractable Avoid triggers, controlled  Hypothyroidism, unspecified type Hypothyroidism-check TSH level, continue medications the same, reminded to take on an empty stomach 30-69mins before food.   Rosacea Continue follow up derm  Elevated blood pressure reading without diagnosis of hypertension - continue medications, DASH diet, exercise and monitor at home. Call if greater than 130/80.   Hyperlipidemia, unspecified hyperlipidemia type -continue medications, check lipids, decrease fatty foods, increase activity.   Vitamin D deficiency -     Continue supplement  Medication management  Anemia, unspecified type monitor  Estrogen deficiency dexa due next year  Osteoporosis, unspecified osteoporosis type, unspecified pathological fracture presence Declines foxamax at this time, continue vitamin D   Future Appointments  Date Time Provider [place]  08/31/2017  9:00 AM Vicie Mutters, PA-C [id number] None    Plan:   During the course of the visit the patient was educated and counseled about appropriate screening and preventive services including:    Pneumococcal vaccine   Prevnar 13  Influenza vaccine  Td vaccine  Screening electrocardiogram  Bone densitometry screening  Colorectal cancer screening  Diabetes screening  Glaucoma screening  Nutrition counseling   Advanced directives: requested    Subjective:   Daisy Oliver is a 81 y.o. female who presents for medicare wellness and 3 month follow up.   Her blood pressure has been controlled at home, today their BP is BP: 124/80 She does workout, she does silver sneakers and will do zumba 3 days a week.  She denies chest pain, shortness of breath, dizziness.  She has had sharp pains middle toes occ with rest or walking x last month.  She is not on cholesterol medication and denies myalgias. Her cholesterol is at  goal. The cholesterol last visit was:  Lab Results  Component Value Date   CHOL 182 09/01/2016   HDL 65 09/01/2016   LDLCALC 98 09/01/2016   TRIG 96 09/01/2016   CHOLHDL 2.8 09/01/2016   Patient is on Vitamin D supplement.   Lab Results  Component Value Date   VD25OH 54 09/01/2016   She follows with Derm.  She use to follow with Dr. Sharol Roussel for alternative medicine but she has retired.  BMI is Body mass index is 30.38 kg/m., she is working on diet and exercise. Wt Readings from Last 3 Encounters:  03/08/17 177 lb (80.3 kg)  09/01/16 172 lb (78 kg)  03/02/16 173 lb (78.5 kg)    Names of Other Physician/Practitioners you currently use: 1. [facility]- here for primary care 2. Dr. Pandora Leiter, eye doctor, last visit last Nov 2018 3.  Dr. Jackalyn Lombard dentist, last visit Nov 2018 Patient Care Team: [name], MD as PCP - General (Internal Medicine) Izora Gala, MD as Consulting Physician (Otolaryngology) Laurence Spates, MD as Consulting Physician (Gastroenterology) Michel Santee, MD as Consulting Physician (Neurology) Lollie Sails, MD as Referring Physician (Internal Medicine)   Medication Review Current Outpatient Medications on File Prior to Visit  Medication Sig Dispense Refill  . BIOTIN PO Take by mouth daily.    . Boron 3 MG CAPS Take by mouth daily.    . Calcium-Magnesium-Vitamin D (CALCIUM 1200+D3 PO) Take by mouth.    . clotrimazole-betamethasone (LOTRISONE) cream Apply 1 application topically 2 (two) times daily. 15 g 2  . Cyanocobalamin (VITAMIN B 12 PO) Take by mouth daily.    . Digestive Enzymes (BETAINE HCL PO) Take by mouth. Ortho  Biotic    . DiphenhydrAMINE HCl (ALLERGY MED PO) Take by mouth. [facility] - generic allergy med    . Ergocalciferol (VITAMIN D2) 2000 UNITS TABS Take by mouth.    . Evening Primrose Oil 1000 MG CAPS Take by mouth.    . Iodine, Kelp, TABS Take by mouth.    Marland Kitchen L-Methylfolate-Algae-B12-B6  (METANX PO) Take by mouth daily.    Marland Kitchen liothyronine (CYTOMEL) 5 MCG tablet Take 2 tablets (10 mcg total) by mouth 2 (two) times daily. 360 tablet 3  . Magnesium 500 MG CAPS Take by mouth.    . Misc Natural Products (OSTEO BI-FLEX JOINT SHIELD PO) Take by mouth. Osteo Bi-Flex    . NEOMYCIN-POLYMYXIN-HYDROCORTISONE (CORTISPORIN) 1 % SOLN otic solution Place 6 drops into both ears 4 (four) times daily. 10 mL 0  . olmesartan (BENICAR) 40 MG tablet Take 1 tablet (40 mg total) by mouth daily. 90 tablet 1  . Omega-3 Fatty Acids (FISH OIL PO) Take by mouth daily. Fish Oil    . OVER THE COUNTER MEDICATION daily. P 5 P50    . oxyCODONE (OXY IR/ROXICODONE) 5 MG immediate release tablet Take 5 mg by mouth every 4 (four) hours as needed for pain.    . Taurine 1000 MG CAPS Take by mouth.    . tretinoin (RETIN-A) 0.05 % cream Apply topically at bedtime. 45 g 0  . Zinc 50 MG CAPS Take by mouth.     No current facility-administered medications on file prior to visit.     Current Problems (verified) Patient Active Problem List   Diagnosis Date Noted  . Vitamin D deficiency 08/28/2014  . Medication management 08/28/2014  . Hypothyroidism 08/28/2014  . Elevated blood pressure reading without diagnosis of hypertension 08/25/2013  . Hyperlipidemia 08/25/2013  . Migraines 08/25/2013  . Rosacea 08/25/2013   Screening Test and Preventative care:  Immunization History  Administered Date(s) Administered  . DT 08/28/2014  . Pneumococcal Conjugate-13 08/28/2015  . Pneumococcal Polysaccharide-23 03/08/2017   Last colonoscopy: 11/2013 will be last one Last mammogram: 08/2016 Last pap smear/pelvic exam: 2010 defer DEXA: 2017 + osteoporosis t -2.6 Stress test 2010 EF 73% normal US Carotid 2003 CXR 08/2013 MRA head and neck 2008  Prior vaccinations: TD or Tdap: 2016  Influenza: declines  Pneumococcal: 2009 Prevnar 13: 2017 Shingles/Zostavax: declines  Allergies Allergies  Allergen Reactions  .  Barbiturates   . Pentothal [Thiopental]   . Sulfa Antibiotics   . Wheat Bran Nausea And Vomiting    SURGICAL HISTORY She  has a past surgical history that includes Radial head arthroplasty (Right, 09/16/2012). FAMILY HISTORY Her family history is not on file. SOCIAL HISTORY She  reports that she quit smoking about 33 years ago. Her smoking use included cigarettes. She has a 44.00 pack-year smoking history. she has never used smokeless tobacco. She reports that she drinks about 1.2 oz of alcohol per week. She reports that she does not use drugs.  MEDICARE WELLNESS OBJECTIVES: Physical activity: Current Exercise Habits: Structured exercise class;Home exercise routine, Type of exercise: walking(zumba), Time (Minutes): 30, Frequency (Times/Week): 5, Weekly Exercise (Minutes/Week): 150, Intensity: Moderate Cardiac risk factors: Cardiac Risk Factors include: advanced age (>50men, >58 women);obesity (BMI >30kg/m2);dyslipidemia Depression/mood screen:   Depression screen [facility] 2/9 03/08/2017  Decreased Interest 0  Down, Depressed, Hopeless 0  PHQ - 2 Score 0    ADLs:  In your present state of health, do you have any difficulty performing the following activities: 03/08/2017  Hearing? N  Vision?  N  Difficulty concentrating or making decisions? N  Walking or climbing stairs? N  Dressing or bathing? N  Doing errands, shopping? N  Some recent data might be hidden     Cognitive Testing  Alert? Yes  Normal Appearance?Yes  Oriented to person? Yes  Place? Yes   Time? Yes  Recall of three objects?  Yes  Can perform simple calculations? Yes  Displays appropriate judgment?Yes  Can read the correct time from a watch face?Yes  EOL planning: Does Patient Have a Medical Advance Directive?: Yes Type of Advance Directive: Healthcare Power of Attorney, Living will [place] in Chart?: No - copy requested   Objective:     Blood pressure 124/80, pulse 63, temperature (!)  97.4 F (36.3 C), resp. rate 14, height 5\' 4"  (1.626 m), weight 177 lb (80.3 kg), SpO2 97 %. Body mass index is 30.38 kg/m.  General appearance: alert, no distress, WD/WN,  female HEENT: normocephalic, sclerae anicteric, TMs pearly, nares patent, no discharge or erythema, pharynx normal Oral cavity: MMM, no lesions Neck: supple, no lymphadenopathy, no thyromegaly, no masses Heart: RRR, normal S1, S2, no murmurs Lungs: CTA bilaterally, no wheezes, rhonchi, or rales Abdomen: +bs, soft, non tender, non distended, no masses, no hepatomegaly, no splenomegaly Musculoskeletal: nontender, no swelling, no obvious deformity Extremities: no edema, no cyanosis, no clubbing Pulses: 2+ symmetric, upper and lower extremities, normal cap refill Neurological: alert, oriented x 3, CN2-12 intact, strength normal upper extremities and lower extremities, sensation normal throughout, DTRs 2+ throughout, no cerebellar signs, gait normal Skin: sparse eyebrows bilaterally Psychiatric: normal affect, behavior normal, pleasant  Breast: declines Gyn: declines Rectal: declines  Medicare Attestation I have personally reviewed: The patient's medical and social history Their use of alcohol, tobacco or illicit drugs Their current medications and supplements The patient's functional ability including ADLs,fall risks, home safety risks, cognitive, and hearing and visual impairment Diet and physical activities Evidence for depression or mood disorders  The patient's weight, height, BMI, and visual acuity have been recorded in the chart.  I have made referrals, counseling, and provided education to the patient based on review of the above and I have provided the patient with a written personalized care plan for preventive services.     Vicie Mutters, PA-C   03/08/2017

## 2017-03-08 NOTE — Patient Instructions (Signed)
Morton Neuralgia Morton neuralgia is a type of foot pain in the area closest to your toes. This area is sometimes called the ball of your foot. Morton neuralgia occurs when a branch of a nerve in your foot (digital nerve) becomes compressed. When this happens over a long period of time, the nerve can thicken (neuroma) and cause pain. This usually occurs between the third and fourth toe. Morton neuralgia can come and go but may get worse over time. What are the causes? Your digital nerve can become compressed and stretched at a point where it passes under a thick band of tissue that connects your toes (intermetatarsal ligament). Morton neuralgia can be caused by mild repetitive damage in this area. This type of damage can result from:  Activities such as running or jumping.  Wearing shoes that are too tight.  What increases the risk? You may be at risk for Morton neuralgia if you:  Are female.  Wear high heels.  Wear shoes that are narrow or tight.  Participate in activities that stretch your toes. These include: ? Running. ? Ballet. ? Long-distance walking.  What are the signs or symptoms? The first symptom of Morton neuralgia is pain that spreads from the ball of your foot to your toes. It may feel like you are walking on a marble. Pain usually gets worse with walking and goes away at night. Other symptoms may include numbness and cramping of your toes. How is this diagnosed? Your health care provider will do a physical exam. When doing the exam, your health care provider may:  Squeeze your foot just behind your toe.  Ask you to move your toes to check for pain.  You may also have tests on your foot to confirm the diagnosis. These may include:  An X-ray.  An MRI.  How is this treated? Treatment for Morton neuralgia may be as simple as changing the kind of shoes you wear. Other treatments may include:  Wearing a supportive pad (orthosis) under the front of your foot. This  lifts your toe bones and takes pressure off the nerve.  Getting injections of numbing medicine and anti-inflammatory medicine (steroid) in the nerve.  Having surgery to remove part of the thickened nerve.  Follow these instructions at home:  Take medicine only as directed by your health care provider.  Wear soft-soled shoes with a wide toe area.  Stop activities that may be causing pain.  Elevate your foot when resting.  Massage your foot.  Apply ice to the injured area: ? Put ice in a plastic bag. ? Place a towel between your skin and the bag. ? Leave the ice on for 20 minutes, 2-3 times a day.  Keep all follow-up visits as directed by your health care provider. This is important. Contact a health care provider if:  Home care instructions are not helping you get better.  Your symptoms change or get worse. This information is not intended to replace advice given to you by your health care provider. Make sure you discuss any questions you have with your health care provider. Document Released: 07/06/2000 Document Revised: 09/05/2015 Document Reviewed: 05/31/2013 Elsevier Interactive Patient Education  2018 [employer]  

## 2017-07-09 ENCOUNTER — Other Ambulatory Visit: Payer: Self-pay | Admitting: Physician Assistant

## 2017-07-09 DIAGNOSIS — R030 Elevated blood-pressure reading, without diagnosis of hypertension: Secondary | ICD-10-CM

## 2017-07-13 ENCOUNTER — Other Ambulatory Visit: Payer: Self-pay | Admitting: Internal Medicine

## 2017-07-13 DIAGNOSIS — Z139 Encounter for screening, unspecified: Secondary | ICD-10-CM

## 2017-08-25 ENCOUNTER — Ambulatory Visit
Admission: RE | Admit: 2017-08-25 | Discharge: 2017-08-25 | Disposition: A | Discharge status: Home / Self Care | Payer: Medicare Other | Source: Ambulatory Visit | Attending: Internal Medicine | Admitting: Internal Medicine

## 2017-08-25 DIAGNOSIS — Z139 Encounter for screening, unspecified: Secondary | ICD-10-CM

## 2017-08-31 ENCOUNTER — Encounter: Payer: Self-pay | Admitting: Physician Assistant

## 2017-09-13 NOTE — Progress Notes (Signed)
Complete Physical  Assessment and Plan:  Diagnoses and all orders for this visit:  Encounter for routine adult health examination without abnormal findings  Other migraine without status migrainosus, not intractable Avoid triggers, controlled  Hypothyroidism, unspecified type continue medications the same pending lab results reminded to take on an empty stomach 30-108mins before food.  check TSH level  Rosacea Continue follow up with derm  Vitamin D deficiency Continue supplementation Check vitamin D level  Medication management CBC, CMP/GFR  Hyperlipidemia, unspecified hyperlipidemia type Currently at goal by lifestyle Continue low cholesterol diet and exercise.  Check lipid panel.   Elevated blood pressure reading without diagnosis of hypertension Monitor blood pressure at home; call if consistently over 130/80 Continue DASH diet.   Reminder to go to the ER if any CP, SOB, nausea, dizziness, severe HA, changes vision/speech, left arm numbness and tingling and jaw pain.  BMI 29.0-29.9,adult  Continue to recommend diet heavy in fruits and veggies and low in animal meats, cheeses, and dairy products, appropriate calorie intake Discuss exercise recommendations routinely Continue to monitor weight at each visit   Orders Placed This Encounter  Procedures  . CBC with Differential/Platelet  . COMPLETE METABOLIC PANEL WITH GFR  . Magnesium  . Lipid panel  . TSH  . Hemoglobin A1c  . VITAMIN D 25 Hydroxy (Vit-D Deficiency, Fractures)  . Urinalysis w microscopic + reflex cultur  . Microalbumin / creatinine urine ratio  . EKG 12-Lead   Discussed med's effects and SE's. Screening labs and tests as requested with regular follow-up as recommended. Over 40 minutes of exam, counseling, chart review, and complex, high level critical decision making was performed this visit.   Future Appointments  Date Time Provider [place]  09/15/2018  3:00 PM Vicie Mutters, PA-C  [facility] None    HPI  82 y.o. female  presents for a complete physical and follow up for has Elevated blood pressure reading without diagnosis of hypertension; Hyperlipidemia; Migraines; Rosacea; Vitamin D deficiency; Medication management; and Hypothyroidism on their problem list. She is followed by derm for rosacea.   BMI is Body mass index is 30.55 kg/m., she has been working on diet and exercise. Wt Readings from Last 3 Encounters:  09/14/17 178 lb (80.7 kg)  03/08/17 177 lb (80.3 kg)  09/01/16 172 lb (78 kg)   Today their BP is BP: 120/72 She does workout. She denies chest pain, shortness of breath, dizziness.   She is not on cholesterol medication and denies myalgias. Her cholesterol is at goal. The cholesterol last visit was:   Lab Results  Component Value Date   CHOL 182 09/01/2016   HDL 65 09/01/2016   LDLCALC 98 09/01/2016   TRIG 96 09/01/2016   CHOLHDL 2.8 09/01/2016   She is on thyroid medication. Her medication was not changed last visit.   Lab Results  Component Value Date   TSH 1.22 09/01/2016   Last GFR: Lab Results  Component Value Date   GFRNONAA 82 09/01/2016   Patient is on Vitamin D supplement.   Lab Results  Component Value Date   VD25OH 54 09/01/2016      Current Medications:  Current Outpatient Medications on File Prior to Visit  Medication Sig Dispense Refill  . BIOTIN PO Take by mouth daily.    . Calcium-Magnesium-Vitamin D (CALCIUM 1200+D3 PO) Take by mouth.    . Cyanocobalamin (VITAMIN B 12 PO) Take by mouth daily.    . Digestive Enzymes (BETAINE HCL PO) Take by mouth. Ortho  Biotic    . DiphenhydrAMINE HCl (ALLERGY MED PO) Take by mouth. [facility] - generic allergy med    . Ergocalciferol (VITAMIN D2) 2000 UNITS TABS Take by mouth.    . Magnesium 500 MG CAPS Take by mouth.    . Misc Natural Products (OSTEO BI-FLEX JOINT SHIELD PO) Take by mouth. Osteo Bi-Flex    . NEOMYCIN-POLYMYXIN-HYDROCORTISONE (CORTISPORIN) 1 % SOLN otic solution Place 6  drops into both ears 4 (four) times daily. 10 mL 0  . olmesartan (BENICAR) 40 MG tablet TAKE 1 TABLET BY MOUTH EVERY DAY 90 tablet 0  . Omega-3 Fatty Acids (FISH OIL PO) Take by mouth daily. Fish Oil    . oxyCODONE (OXY IR/ROXICODONE) 5 MG immediate release tablet Take 5 mg by mouth every 4 (four) hours as needed for pain.    . Taurine 1000 MG CAPS Take by mouth.    . Zinc 50 MG CAPS Take by mouth.    . Boron 3 MG CAPS Take by mouth daily.    . Evening Primrose Oil 1000 MG CAPS Take by mouth.    . Iodine, Kelp, TABS Take by mouth.    Marland Kitchen L-Methylfolate-Algae-B12-B6 (METANX PO) Take by mouth daily.    Marland Kitchen OVER THE COUNTER MEDICATION daily. P 5 P50     No current facility-administered medications on file prior to visit.    Allergies:  Allergies  Allergen Reactions  . Barbiturates   . Pentothal [Thiopental]   . Sulfa Antibiotics   . Wheat Bran Nausea And Vomiting   Medical History:  She has Elevated blood pressure reading without diagnosis of hypertension; Hyperlipidemia; Migraines; Rosacea; Vitamin D deficiency; Medication management; and Hypothyroidism on their problem list. Health Maintenance:   Immunization History  Administered Date(s) Administered  . DT 08/28/2014  . Pneumococcal Conjugate-13 08/28/2015  . Pneumococcal Polysaccharide-23 03/08/2017   Last colonoscopy: 11/2013 will be last one Last mammogram: 08/2017 Last pap smear/pelvic exam: 2010 defer DEXA: 9/ 2017 + osteoporosis t -2.6 Stress test 2010 EF 73% normal US Carotid 2003 CXR 08/2013 MRA head and neck 2008  Prior vaccinations: TD or Tdap: 2016  Influenza: declines  Pneumococcal: 2009 Prevnar 13: 2017 Shingles/Zostavax: declines  Last Eye Exam: Dr. Herbert Deaner, 02/2017 Last Dental Exam: Dr. Gilford Rile, 02/2017  Patient Care Team: [name], MD as PCP - General (Internal Medicine) Izora Gala, MD as Consulting Physician (Otolaryngology) Laurence Spates, MD as Consulting Physician (Gastroenterology) Michel Santee, MD as Consulting Physician (Neurology) Lollie Sails, MD as Referring Physician (Internal Medicine)  Surgical History:  She has a past surgical history that includes Radial head arthroplasty (Right, 09/16/2012). Family History:  Herfamily history includes Brain cancer (age of onset: 66) in her father; Cancer in her paternal grandfather; Heart disease in her mother; Hypertension in her sister and sister; Osteoporosis in her sister and sister; Suicidality in her paternal grandfather. Social History:  She reports that she quit smoking about 34 years ago. Her smoking use included cigarettes. She has a 44.00 pack-year smoking history. She has never used smokeless tobacco. She reports that she drinks alcohol. She reports that she does not use drugs.  Review of Systems: Review of Systems  Constitutional: Negative for malaise/fatigue and weight loss.  HENT: Negative for hearing loss and tinnitus.   Eyes: Negative for blurred vision and double vision.  Respiratory: Negative for cough, sputum production, shortness of breath and wheezing.   Cardiovascular: Negative for chest pain, palpitations, orthopnea, claudication, leg swelling and PND.  Gastrointestinal: Negative for abdominal pain,  blood in stool, constipation, diarrhea, heartburn, melena, nausea and vomiting.  Genitourinary: Negative.   Musculoskeletal: Negative for falls, joint pain and myalgias.  Skin: Negative for rash.  Neurological: Negative for dizziness, tingling, sensory change, weakness and headaches.  Endo/Heme/Allergies: Negative for polydipsia.  Psychiatric/Behavioral: Negative.  Negative for depression, memory loss, substance abuse and suicidal ideas. The patient is not nervous/anxious and does not have insomnia.   All other systems reviewed and are negative.   Physical Exam: Estimated body mass index is 30.55 kg/m as calculated from the following:   Height as of this encounter: 5\' 4"  (1.626 m).   Weight as of this  encounter: 178 lb (80.7 kg). BP 120/72   Pulse 75   Temp (!) 97.5 F (36.4 C)   Ht 5\' 4"  (1.626 m)   Wt 178 lb (80.7 kg)   SpO2 96%   BMI 30.55 kg/m  General Appearance: Well nourished, in no apparent distress.  Eyes: PERRLA, EOMs, conjunctiva no swelling or erythema, normal fundi and vessels.  Sinuses: No Frontal/maxillary tenderness  ENT/Mouth: Ext aud canals clear, normal light reflex with TMs without erythema, bulging. Good dentition. No erythema, swelling, or exudate on post pharynx. Tonsils not swollen or erythematous. Hearing normal.  Neck: Supple, thyroid normal. No bruits  Respiratory: Respiratory effort normal, BS equal bilaterally without rales, rhonchi, wheezing or stridor.  Cardio: RRR without murmurs, rubs or gallops. Brisk peripheral pulses without edema.  Chest: symmetric, with normal excursions and percussion.  Breasts: Symmetric, without lumps, nipple discharge, retractions.  Abdomen: Soft, nontender, no guarding, rebound, hernias, masses, or organomegaly.  Lymphatics: Non tender without lymphadenopathy.  Genitourinary: Defer Musculoskeletal: Full ROM all peripheral extremities,5/5 strength, and normal gait.  Skin: Warm, dry without rashes, lesions, ecchymosis. Neuro: Cranial nerves intact, reflexes equal bilaterally. Normal muscle tone, no cerebellar symptoms. Sensation intact.  Psych: Awake and oriented X 3, normal affect, Insight and Judgment appropriate.   EKG: WNL no ST changes.  Izora Ribas 3:29 PM  [facility]

## 2017-09-14 ENCOUNTER — Encounter: Payer: Self-pay | Admitting: Adult Health

## 2017-09-14 ENCOUNTER — Ambulatory Visit: Payer: Medicare Other | Admitting: Adult Health

## 2017-09-14 VITALS — BP 120/72 | HR 75 | Temp 97.5°F | Ht 64.0 in | Wt 178.0 lb

## 2017-09-14 DIAGNOSIS — Z136 Encounter for screening for cardiovascular disorders: Secondary | ICD-10-CM

## 2017-09-14 DIAGNOSIS — E559 Vitamin D deficiency, unspecified: Secondary | ICD-10-CM

## 2017-09-14 DIAGNOSIS — Z0000 Encounter for general adult medical examination without abnormal findings: Secondary | ICD-10-CM

## 2017-09-14 DIAGNOSIS — E785 Hyperlipidemia, unspecified: Secondary | ICD-10-CM

## 2017-09-14 DIAGNOSIS — E039 Hypothyroidism, unspecified: Secondary | ICD-10-CM

## 2017-09-14 DIAGNOSIS — G43809 Other migraine, not intractable, without status migrainosus: Secondary | ICD-10-CM

## 2017-09-14 DIAGNOSIS — Z79899 Other long term (current) drug therapy: Secondary | ICD-10-CM

## 2017-09-14 DIAGNOSIS — Z131 Encounter for screening for diabetes mellitus: Secondary | ICD-10-CM

## 2017-09-14 DIAGNOSIS — Z1389 Encounter for screening for other disorder: Secondary | ICD-10-CM

## 2017-09-14 DIAGNOSIS — L719 Rosacea, unspecified: Secondary | ICD-10-CM

## 2017-09-14 DIAGNOSIS — R030 Elevated blood-pressure reading, without diagnosis of hypertension: Secondary | ICD-10-CM

## 2017-09-14 DIAGNOSIS — Z6829 Body mass index (BMI) 29.0-29.9, adult: Secondary | ICD-10-CM

## 2017-09-14 MED ORDER — TRETINOIN 0.05 % EX CREA: TOPICAL_CREAM | Freq: Every day | CUTANEOUS | 0 refills | Status: DC

## 2017-09-14 MED ORDER — LIOTHYRONINE SODIUM 5 MCG PO TABS
10.0000 ug | ORAL_TABLET | Freq: Two times a day (BID) | ORAL | 3 refills | Status: DC
Start: 2017-09-14 — End: 2018-09-23

## 2017-09-14 MED ORDER — CLOTRIMAZOLE-BETAMETHASONE 1-0.05 % EX CREA: 1.0000 "application " | TOPICAL_CREAM | Freq: Two times a day (BID) | CUTANEOUS | 2 refills | Status: DC

## 2017-09-14 NOTE — Patient Instructions (Signed)
Aim for 7+ servings of fruits and vegetables daily  80+ fluid ounces of water or unsweet tea for healthy kidneys  1 drink of alcohol per day  Limit animal fats in diet for cholesterol and heart health - choose grass fed whenever available  Aim for low stress - take time to unwind and care for your mental health  Aim for 150 min of moderate intensity exercise weekly for heart health, and weights twice weekly for bone health  Aim for 7-9 hours of sleep daily      When it comes to diets, agreement about the perfect plan isn't easy to find, even among the experts. Experts at the [employer] developed an idea known as the Healthy Eating Plate. Just imagine a plate divided into logical, healthy portions.  The emphasis is on diet quality:  Load up on vegetables and fruits - one-half of your plate: Aim for color and variety, and remember that potatoes don't count.  Go for whole grains - one-quarter of your plate: Whole wheat, barley, wheat berries, quinoa, oats, brown rice, and foods made with them. If you want pasta, go with whole wheat pasta.  Protein power - one-quarter of your plate: Fish, chicken, beans, and nuts are all healthy, versatile protein sources. Limit red meat.  The diet, however, does go beyond the plate, offering a few other suggestions.  Use healthy plant oils, such as olive, canola, soy, corn, sunflower and peanut. Check the labels, and avoid partially hydrogenated oil, which have unhealthy trans fats.  If you're thirsty, drink water. Coffee and tea are good in moderation, but skip sugary drinks and limit milk and dairy products to one or two daily servings.  The type of carbohydrate in the diet is more important than the amount. Some sources of carbohydrates, such as vegetables, fruits, whole grains, and beans-are healthier than others.  Finally, stay active.  

## 2017-09-15 LAB — URINALYSIS, ROUTINE W REFLEX MICROSCOPIC
Bacteria, UA: NONE SEEN /HPF
Bilirubin Urine: NEGATIVE
Glucose, UA: NEGATIVE
HYALINE CAST: NONE SEEN /LPF
Hgb urine dipstick: NEGATIVE
Ketones, ur: NEGATIVE
Nitrite: NEGATIVE
PH: 5.5 (ref 5.0–8.0)
Protein, ur: NEGATIVE
SPECIFIC GRAVITY, URINE: 1.025 (ref 1.001–1.03)

## 2017-09-15 LAB — CBC WITH DIFFERENTIAL/PLATELET
BASOS ABS: 51 {cells}/uL (ref 0–200)
Basophils Relative: 0.6 %
EOS PCT: 2.2 %
Eosinophils Absolute: 187 cells/uL (ref 15–500)
HEMATOCRIT: 35.9 % (ref 35.0–45.0)
Hemoglobin: 12.7 g/dL (ref 11.7–15.5)
LYMPHS ABS: 2125 {cells}/uL (ref 850–3900)
MCH: 31.4 pg (ref 27.0–33.0)
MCHC: 35.4 g/dL (ref 32.0–36.0)
MCV: 88.9 fL (ref 80.0–100.0)
MPV: 9.8 fL (ref 7.5–12.5)
Monocytes Relative: 9.6 %
NEUTROS PCT: 62.6 %
Neutro Abs: 5321 cells/uL (ref 1500–7800)
PLATELETS: 201 10*3/uL (ref 140–400)
RBC: 4.04 10*6/uL (ref 3.80–5.10)
RDW: 12.3 % (ref 11.0–15.0)
TOTAL LYMPHOCYTE: 25 %
WBC mixed population: 816 cells/uL (ref 200–950)
WBC: 8.5 10*3/uL (ref 3.8–10.8)

## 2017-09-15 LAB — MICROALBUMIN / CREATININE URINE RATIO
CREATININE, URINE: 97 mg/dL (ref 20–275)
MICROALB UR: 1.1 mg/dL
MICROALB/CREAT RATIO: 11 ug/mg{creat} (ref <30)

## 2017-09-15 LAB — LIPID PANEL
CHOLESTEROL: 174 mg/dL (ref <200)
HDL: 64 mg/dL (ref >50)
LDL Cholesterol (Calc): 91 mg/dL (calc)
Non-HDL Cholesterol (Calc): 110 mg/dL (calc) (ref <130)
TRIGLYCERIDES: 93 mg/dL (ref <150)
Total CHOL/HDL Ratio: 2.7 (calc) (ref <5.0)

## 2017-09-15 LAB — TSH: TSH: 0.88 mIU/L (ref 0.40–4.50)

## 2017-09-15 LAB — HEMOGLOBIN A1C
EAG (MMOL/L): 5.8 (calc)
Hgb A1c MFr Bld: 5.3 % of total Hgb (ref <5.7)
Mean Plasma Glucose: 105 (calc)

## 2017-09-15 LAB — COMPLETE METABOLIC PANEL WITH GFR
AG RATIO: 1.4 (calc) (ref 1.0–2.5)
ALBUMIN MSPROF: 4.2 g/dL (ref 3.6–5.1)
ALT: 17 U/L (ref 6–29)
AST: 23 U/L (ref 10–35)
Alkaline phosphatase (APISO): 61 U/L (ref 33–130)
BUN/Creatinine Ratio: 38 (calc) — ABNORMAL HIGH (ref 6–22)
BUN: 28 mg/dL — ABNORMAL HIGH (ref 7–25)
CALCIUM: 9.4 mg/dL (ref 8.6–10.4)
CHLORIDE: 104 mmol/L (ref 98–110)
CO2: 29 mmol/L (ref 20–32)
CREATININE: 0.73 mg/dL (ref 0.60–0.93)
GFR, EST NON AFRICAN AMERICAN: 78 mL/min/{1.73_m2} (ref >=60)
GFR, Est African American: 91 mL/min/{1.73_m2} (ref >=60)
GLOBULIN: 3 g/dL (ref 1.9–3.7)
Glucose, Bld: 109 mg/dL — ABNORMAL HIGH (ref 65–99)
POTASSIUM: 3.6 mmol/L (ref 3.5–5.3)
Sodium: 140 mmol/L (ref 135–146)
Total Bilirubin: 0.7 mg/dL (ref 0.2–1.2)
Total Protein: 7.2 g/dL (ref 6.1–8.1)

## 2017-09-15 LAB — VITAMIN D 25 HYDROXY (VIT D DEFICIENCY, FRACTURES): VIT D 25 HYDROXY: 45 ng/mL (ref 30–100)

## 2017-09-15 LAB — MAGNESIUM: MAGNESIUM: 2.1 mg/dL (ref 1.5–2.5)

## 2017-12-13 ENCOUNTER — Other Ambulatory Visit: Payer: Self-pay | Admitting: Physician Assistant

## 2017-12-13 DIAGNOSIS — R030 Elevated blood-pressure reading, without diagnosis of hypertension: Secondary | ICD-10-CM

## 2018-01-25 MED ORDER — SCOPOLAMINE 1 MG/3DAYS TD PT72: 1.0000 | MEDICATED_PATCH | TRANSDERMAL | 0 refills | Status: DC

## 2018-02-01 NOTE — Telephone Encounter (Signed)
P.A done & Approved.

## 2018-03-16 NOTE — Progress Notes (Signed)
MEDICARE WELLNESS  Assessment:   Other migraine without status migrainosus, not intractable Avoid triggers, controlled  Hypothyroidism, unspecified type Hypothyroidism-check TSH level, continue medications the same, reminded to take on an empty stomach 30-88mins before food.   Rosacea Continue follow up derm  Elevated blood pressure reading without diagnosis of hypertension - continue medications, DASH diet, exercise and monitor at home. Call if greater than 130/80.   Hyperlipidemia, unspecified hyperlipidemia type -continue medications, check lipids, decrease fatty foods, increase activity.   Vitamin D deficiency -     Continue supplement  Medication management  Anemia, unspecified type monitor  Estrogen deficiency dexa due next year  Osteoporosis, unspecified osteoporosis type, unspecified pathological fracture presence Declines foxamax at this time, continue vitamin D   Future Appointments  Date Time Provider [place]  09/15/2018  3:00 PM Vicie Mutters, PA-C [facility] None    Plan:   During the course of the visit the patient was educated and counseled about appropriate screening and preventive services including:    Pneumococcal vaccine   Prevnar 13  Influenza vaccine  Td vaccine  Screening electrocardiogram  Bone densitometry screening  Colorectal cancer screening  Diabetes screening  Glaucoma screening  Nutrition counseling   Advanced directives: requested    Subjective:   Daisy Oliver is a 82 y.o. female who presents for medicare wellness and 3 month follow up.   Her blood pressure has been controlled at home, today their BP is BP: 122/84 She does workout, she does silver sneakers and will do zumba 3 days a week.  She denies chest pain, shortness of breath, dizziness.   She is not on cholesterol medication and denies myalgias. Her cholesterol is at goal. The cholesterol last visit was:  Lab Results  Component Value Date    CHOL 174 09/14/2017   HDL 64 09/14/2017   LDLCALC 91 09/14/2017   TRIG 93 09/14/2017   CHOLHDL 2.7 09/14/2017   Patient is on Vitamin D supplement.   Lab Results  Component Value Date   VD25OH 45 09/14/2017   She follows with Derm, recent left basal cell carcinoma.  Will drink 1-2 vodka drinks a night.  She use to follow with Dr. Sharol Roussel for alternative medicine but she has retired.  BMI is Body mass index is 30.28 kg/m., she is working on diet and exercise. Wt Readings from Last 3 Encounters:  03/17/18 176 lb 6.4 oz (80 kg)  09/14/17 178 lb (80.7 kg)  03/08/17 177 lb (80.3 kg)    Names of Other Physician/Practitioners you currently use: 1. [facility]- here for primary care 2. Dr. Herbert Deaner, eye doctor, last visit last Dec 2019 3.  Dr. Jackalyn Lombard dentist, last visit Nov 2019 Patient Care Team: [name], MD as PCP - General (Internal Medicine) Izora Gala, MD as Consulting Physician (Otolaryngology) Laurence Spates, MD as Consulting Physician (Gastroenterology) Michel Santee, MD as Consulting Physician (Neurology) Lollie Sails, MD as Referring Physician (Internal Medicine)   Medication Review Current Outpatient Medications on File Prior to Visit  Medication Sig Dispense Refill  . BENICAR 40 MG tablet TAKE 1 TABLET BY MOUTH EVERY DAY 90 tablet 1  . BIOTIN PO Take by mouth daily.    . Calcium-Magnesium-Vitamin D (CALCIUM 1200+D3 PO) Take by mouth.    . clotrimazole-betamethasone (LOTRISONE) cream Apply 1 application topically 2 (two) times daily. 15 g 2  . Cyanocobalamin (VITAMIN B 12 PO) Take by mouth daily.    . Digestive Enzymes (BETAINE HCL PO)  Take by mouth. Ortho Biotic    . DiphenhydrAMINE HCl (ALLERGY MED PO) Take by mouth. [facility] - generic allergy med    . Ergocalciferol (VITAMIN D2) 2000 UNITS TABS Take by mouth.    Marland Kitchen L-Methylfolate-Algae-B12-B6 (METANX PO) Take by mouth daily.    Marland Kitchen liothyronine (CYTOMEL) 5 MCG  tablet Take 2 tablets (10 mcg total) by mouth 2 (two) times daily. 360 tablet 3  . Magnesium 500 MG CAPS Take by mouth.    . Misc Natural Products (OSTEO BI-FLEX JOINT SHIELD PO) Take by mouth. Osteo Bi-Flex    . NEOMYCIN-POLYMYXIN-HYDROCORTISONE (CORTISPORIN) 1 % SOLN otic solution Place 6 drops into both ears 4 (four) times daily. 10 mL 0  . Omega-3 Fatty Acids (FISH OIL PO) Take by mouth daily. Fish Oil    . OVER THE COUNTER MEDICATION daily. P 5 P50    . oxyCODONE (OXY IR/ROXICODONE) 5 MG immediate release tablet Take 5 mg by mouth every 4 (four) hours as needed for pain.    Marland Kitchen scopolamine (TRANSDERM-SCOP, 1.5 MG,) 1 MG/3DAYS Place 1 patch (1.5 mg total) onto the skin every 3 (three) days. 4 patch 0  . Taurine 1000 MG CAPS Take by mouth.    . Zinc 50 MG CAPS Take by mouth.     No current facility-administered medications on file prior to visit.     Current Problems (verified) Patient Active Problem List   Diagnosis Date Noted  . History of basal cell cancer 03/17/2018  . Vitamin D deficiency 08/28/2014  . Medication management 08/28/2014  . Hypothyroidism 08/28/2014  . Elevated blood pressure reading without diagnosis of hypertension 08/25/2013  . Hyperlipidemia 08/25/2013  . Migraines 08/25/2013  . Rosacea 08/25/2013   Screening Test and Preventative care:  Immunization History  Administered Date(s) Administered  . DT 08/28/2014  . Pneumococcal Conjugate-13 08/28/2015  . Pneumococcal Polysaccharide-23 03/08/2017   Last colonoscopy: 11/2013 will be last one Last mammogram: 08/2017 Last pap smear/pelvic exam: 2010 defer DEXA: 2017 + osteoporosis t -2.6 Stress test 2010 EF 73% normal US Carotid 2003 CXR 08/2013 MRA head and neck 2008  Prior vaccinations: TD or Tdap: 2016  Influenza: declines  Pneumococcal: 2009 Prevnar 13: 2017 Shingles/Zostavax: declines  Allergies Allergies  Allergen Reactions  . Barbiturates   . Pentothal [Thiopental]   . Sulfa Antibiotics    . Wheat Bran Nausea And Vomiting    SURGICAL HISTORY She  has a past surgical history that includes Radial head arthroplasty (Right, 09/16/2012). FAMILY HISTORY Her family history includes Brain cancer (age of onset: 4) in her father; Cancer in her paternal grandfather; Heart disease in her mother; Hypertension in her sister and sister; Osteoporosis in her sister and sister; Suicidality in her paternal grandfather. SOCIAL HISTORY She  reports that she quit smoking about 34 years ago. Her smoking use included cigarettes. She has a 44.00 pack-year smoking history. She has never used smokeless tobacco. She reports that she drinks alcohol. She reports that she does not use drugs.  MEDICARE WELLNESS OBJECTIVES: Physical activity: Current Exercise Habits: Home exercise routine;Structured exercise class, Type of exercise: exercise ball;walking Cardiac risk factors: Cardiac Risk Factors include: advanced age (>49men, >85 women);obesity (BMI >30kg/m2);dyslipidemia Depression/mood screen:   Depression screen [facility] 2/9 03/17/2018  Decreased Interest 0  Down, Depressed, Hopeless 0  PHQ - 2 Score 0    ADLs:  In your present state of health, do you have any difficulty performing the following activities: 03/17/2018  Hearing? N  Vision? N  Difficulty concentrating  or making decisions? N  Walking or climbing stairs? N  Dressing or bathing? N  Doing errands, shopping? N  Some recent data might be hidden     Cognitive Testing  Alert? Yes  Normal Appearance?Yes  Oriented to person? Yes  Place? Yes   Time? Yes  Recall of three objects?  Yes  Can perform simple calculations? Yes  Displays appropriate judgment?Yes  Can read the correct time from a watch face?Yes  EOL planning: Does Patient Have a Medical Advance Directive?: Yes Type of Advance Directive: Healthcare Power of Attorney, Living will [place] in Chart?: No - copy requested   Objective:     Blood pressure  122/84, pulse 80, temperature 97.7 F (36.5 C), height 5\' 4"  (1.626 m), weight 176 lb 6.4 oz (80 kg), SpO2 97 %. Body mass index is 30.28 kg/m.  General appearance: alert, no distress, WD/WN,  female HEENT: normocephalic, sclerae anicteric, TMs pearly, nares patent, no discharge or erythema, pharynx normal Oral cavity: MMM, no lesions Neck: supple, no lymphadenopathy, no thyromegaly, no masses Heart: RRR, normal S1, S2, no murmurs Lungs: CTA bilaterally, no wheezes, rhonchi, or rales Abdomen: +bs, soft, non tender, non distended, no masses, no hepatomegaly, no splenomegaly Musculoskeletal: nontender, no swelling, no obvious deformity Extremities: no edema, no cyanosis, no clubbing Pulses: 2+ symmetric, upper and lower extremities, normal cap refill Neurological: alert, oriented x 3, CN2-12 intact, strength normal upper extremities and lower extremities, sensation normal throughout, DTRs 2+ throughout, no cerebellar signs, gait normal Skin: sparse eyebrows bilaterally Psychiatric: normal affect, behavior normal, pleasant  Breast: declines Gyn: declines Rectal: declines  Medicare Attestation I have personally reviewed: The patient's medical and social history Their use of alcohol, tobacco or illicit drugs Their current medications and supplements The patient's functional ability including ADLs,fall risks, home safety risks, cognitive, and hearing and visual impairment Diet and physical activities Evidence for depression or mood disorders  The patient's weight, height, BMI, and visual acuity have been recorded in the chart.  I have made referrals, counseling, and provided education to the patient based on review of the above and I have provided the patient with a written personalized care plan for preventive services.     Vicie Mutters, PA-C   03/17/2018

## 2018-03-17 ENCOUNTER — Ambulatory Visit: Payer: Medicare Other | Admitting: Physician Assistant

## 2018-03-17 ENCOUNTER — Encounter: Payer: Self-pay | Admitting: Physician Assistant

## 2018-03-17 VITALS — BP 122/84 | HR 80 | Temp 97.7°F | Ht 64.0 in | Wt 176.4 lb

## 2018-03-17 DIAGNOSIS — R030 Elevated blood-pressure reading, without diagnosis of hypertension: Secondary | ICD-10-CM | POA: Diagnosis not present

## 2018-03-17 DIAGNOSIS — Z85828 Personal history of other malignant neoplasm of skin: Secondary | ICD-10-CM

## 2018-03-17 DIAGNOSIS — G43809 Other migraine, not intractable, without status migrainosus: Secondary | ICD-10-CM

## 2018-03-17 DIAGNOSIS — Z79899 Other long term (current) drug therapy: Secondary | ICD-10-CM

## 2018-03-17 DIAGNOSIS — Z0001 Encounter for general adult medical examination with abnormal findings: Secondary | ICD-10-CM | POA: Diagnosis not present

## 2018-03-17 DIAGNOSIS — R6889 Other general symptoms and signs: Secondary | ICD-10-CM

## 2018-03-17 DIAGNOSIS — E039 Hypothyroidism, unspecified: Secondary | ICD-10-CM

## 2018-03-17 DIAGNOSIS — E559 Vitamin D deficiency, unspecified: Secondary | ICD-10-CM | POA: Diagnosis not present

## 2018-03-17 DIAGNOSIS — Z0000 Encounter for general adult medical examination without abnormal findings: Secondary | ICD-10-CM

## 2018-03-17 DIAGNOSIS — E785 Hyperlipidemia, unspecified: Secondary | ICD-10-CM

## 2018-03-17 DIAGNOSIS — L719 Rosacea, unspecified: Secondary | ICD-10-CM

## 2018-03-17 MED ORDER — TRETINOIN 0.05 % EX CREA: TOPICAL_CREAM | Freq: Every day | CUTANEOUS | 0 refills | Status: DC

## 2018-03-17 NOTE — Patient Instructions (Addendum)
Check out eatright.org  Try the pepcid x 1 month and see if this helps with the taste.   Silent reflux: Not all heartburn burns...Marland KitchenMarland KitchenMarland Kitchen  What is LPR? Laryngopharyngeal reflux (LPR) or silent reflux is a condition in which acid that is made in the stomach travels up the esophagus (swallowing tube) and gets to the throat. Not everyone with reflux has a lot of heartburn or indigestion. In fact, many people with LPR never have heartburn. This is why LPR is called SILENT REFLUX, and the terms "Silent reflux" and "LPR" are often used interchangeably. Because LPR is silent, it is sometimes difficult to diagnose.  How can you tell if you have LPR?  Marland Kitchen Chronic hoarseness- Some people have hoarseness that comes and goes . throat clearing  . Cough . It can cause shortness of breath and cause asthma like symptoms. Marland Kitchen a feeling of a lump in the throat  . difficulty swallowing . a problem with too much nose and throat drainage.  . Some people will feel their esophagus spasm which feels like their heart beating hard and fast, this will usually be after a meal, at rest, or lying down at night.    How do I treat this? Treatment for LPR should be individualized, and your doctor will suggest the best treatment for you. Generally there are several treatments for LPR: . changing habits and diet to reduce reflux,  . medications to reduce stomach acid, and  . surgery to prevent reflux. Most people with LPR need to modify how and when they eat, as well as take some medication, to get well. Sometimes, nonprescription liquid antacids, such as Maalox, Gelucil and Mylanta are recommended. When used, these antacids should be taken four times each day - one tablespoon one hour after each meal and before bedtime. Dietary and lifestyle changes alone are not often enough to control LPR - medications that reduce stomach acid are also usually needed. These must be prescribed by our doctor.   TIPS FOR REDUCING REFLUX AND  LPR Control your LIFE-STYLE and your DIET! Marland Kitchen If you use tobacco, QUIT.  Marland Kitchen Smoking makes you reflux. After every cigarette you have some LPR.  . Don't wear clothing that is too tight, especially around the waist (trousers, corsets, belts).  . Do not lie down just after eating...in fact, do not eat within three hours of bedtime.  . You should be on a low-fat diet.  . Limit your intake of red meat.  . Limit your intake of butter.  Marland Kitchen Avoid fried foods.  . Avoid chocolate  . Avoid cheese.  Marland Kitchen Avoid eggs. Marland Kitchen Specifically avoid caffeine (especially coffee and tea), soda pop (especially cola) and mints.  . Avoid alcoholic beverages, particularly in the evening.     When it comes to diets, agreement about the perfect plan isn't easy to find, even among the experts. Experts at the [employer] developed an idea known as the Healthy Eating Plate. Just imagine a plate divided into logical, healthy portions.  The emphasis is on diet quality:  Load up on vegetables and fruits - one-half of your plate: Aim for color and variety, and remember that potatoes don't count.  Go for whole grains - one-quarter of your plate: Whole wheat, barley, wheat berries, quinoa, oats, brown rice, and foods made with them. If you want pasta, go with whole wheat pasta.  Protein power - one-quarter of your plate: Fish, chicken, beans, and nuts are all healthy, versatile protein  sources. Limit red meat.  The diet, however, does go beyond the plate, offering a few other suggestions.  Use healthy plant oils, such as olive, canola, soy, corn, sunflower and peanut. Check the labels, and avoid partially hydrogenated oil, which have unhealthy trans fats.  If you're thirsty, drink water. Coffee and tea are good in moderation, but skip sugary drinks and limit milk and dairy products to one or two daily servings.  The type of carbohydrate in the diet is more important than the amount. Some sources of  carbohydrates, such as vegetables, fruits, whole grains, and beans-are healthier than others.  Finally, stay active.  Breaking a bone can be serious, especially if the bone is in the hip. People who break a hip sometimes lose the ability to walk on their own. Many of them end up in a nursing home. That's why it is so important to avoid breaking a bone in the first place.   What can I do to keep my bones as healthy as possible? - You can: ?Eat foods with a lot of calcium, such as milk, yogurt, and green leafy vegetables  ?Eat foods with a lot of vitamin D, such as milk that has vitamin D added, and fish from the ocean  ?Take calcium and vitamin D pills (if you do not get enough from the food that you eat) ?Be active for at least 30 minutes, most days of the week ?Avoid smoking  ?Limit the amount of alcohol you drink to 1 to 2 drinks a day at most  What do osteoporosis medicines do? - If you have osteoporosis or a high risk of breaking a bone, the medicines your doctor prescribes can: ?Reduce bone loss  ?Increase bone density or keep it about the same ?Reduce the chances that you will break a bone   Bisphosphonates - Most people being treated for osteoporosis take these medicines first. If they do not work well enough or cause side effects that are too hard to handle, there are other options.   Most people take one pill every week. If your doctor prescribes a bisphosphonate pill, you must take the medicine exactly as directed. If you don't, the medicine can irritate your throat or stomach. For most bisphosphonate pills, you must:  ?Take the pill first thing in the morning, before you have anything to eat or drink.  ?Drink an 8-ounce glass of water with the pill, but not eat or drink anything else for 30 minutes or 1 hour (depending on which pill you take).  ?Avoid lying down for 30 minutes after taking the pill. (You must sit or stand during that time).

## 2018-03-18 LAB — CBC WITH DIFFERENTIAL/PLATELET
BASOS ABS: 68 {cells}/uL (ref 0–200)
Basophils Relative: 0.9 %
EOS PCT: 6 %
Eosinophils Absolute: 450 cells/uL (ref 15–500)
HCT: 39.9 % (ref 35.0–45.0)
Hemoglobin: 13.6 g/dL (ref 11.7–15.5)
Lymphs Abs: 2093 cells/uL (ref 850–3900)
MCH: 30.5 pg (ref 27.0–33.0)
MCHC: 34.1 g/dL (ref 32.0–36.0)
MCV: 89.5 fL (ref 80.0–100.0)
MONOS PCT: 10 %
MPV: 9.9 fL (ref 7.5–12.5)
NEUTROS ABS: 4140 {cells}/uL (ref 1500–7800)
NEUTROS PCT: 55.2 %
PLATELETS: 235 10*3/uL (ref 140–400)
RBC: 4.46 10*6/uL (ref 3.80–5.10)
RDW: 12.5 % (ref 11.0–15.0)
Total Lymphocyte: 27.9 %
WBC mixed population: 750 cells/uL (ref 200–950)
WBC: 7.5 10*3/uL (ref 3.8–10.8)

## 2018-03-18 LAB — COMPLETE METABOLIC PANEL WITH GFR
AG RATIO: 1.3 (calc) (ref 1.0–2.5)
ALKALINE PHOSPHATASE (APISO): 68 U/L (ref 33–130)
ALT: 29 U/L (ref 6–29)
AST: 30 U/L (ref 10–35)
Albumin: 4.4 g/dL (ref 3.6–5.1)
BUN: 19 mg/dL (ref 7–25)
CO2: 28 mmol/L (ref 20–32)
Calcium: 10.1 mg/dL (ref 8.6–10.4)
Chloride: 99 mmol/L (ref 98–110)
Creat: 0.76 mg/dL (ref 0.60–0.88)
GFR, EST NON AFRICAN AMERICAN: 74 mL/min/{1.73_m2} (ref >=60)
GFR, Est African American: 86 mL/min/{1.73_m2} (ref >=60)
GLOBULIN: 3.3 g/dL (ref 1.9–3.7)
GLUCOSE: 90 mg/dL (ref 65–99)
POTASSIUM: 3.9 mmol/L (ref 3.5–5.3)
SODIUM: 136 mmol/L (ref 135–146)
Total Bilirubin: 1.1 mg/dL (ref 0.2–1.2)
Total Protein: 7.7 g/dL (ref 6.1–8.1)

## 2018-03-18 LAB — MAGNESIUM: Magnesium: 2.3 mg/dL (ref 1.5–2.5)

## 2018-03-18 LAB — LIPID PANEL
CHOL/HDL RATIO: 3 (calc) (ref <5.0)
CHOLESTEROL: 189 mg/dL (ref <200)
HDL: 63 mg/dL (ref >50)
LDL Cholesterol (Calc): 109 mg/dL (calc) — ABNORMAL HIGH
Non-HDL Cholesterol (Calc): 126 mg/dL (calc) (ref <130)
Triglycerides: 80 mg/dL (ref <150)

## 2018-03-18 LAB — TSH: TSH: 0.96 m[IU]/L (ref 0.40–4.50)

## 2018-04-13 HISTORY — PX: CATARACT EXTRACTION, BILATERAL: SHX1313

## 2018-07-15 ENCOUNTER — Other Ambulatory Visit: Payer: Self-pay | Admitting: Internal Medicine

## 2018-07-15 DIAGNOSIS — Z1231 Encounter for screening mammogram for malignant neoplasm of breast: Secondary | ICD-10-CM

## 2018-09-13 NOTE — Progress Notes (Signed)
CPE  Assessment:   Encounter for general adult medical examination with abnormal findings 1 year Will get EKG at wellness  B12 deficiency -     Vitamin B12  Chronic pain of both shoulders -     C-reactive protein -     Sedimentation rate -     DG Cervical Spine Complete; Future -     B. burgdorfi antibodies -     Ehrlichia antibody panel ? From neck with previous cervical fusion versus shoulder impingement - declines Xray or referral at this time - will try shoulder impingement exercises  Hyperreflexia -     B. burgdorfi antibodies -     Ehrlichia antibody panel - normal MRI/MRA 2008 - normal neuro otherwise, declines referral - will call if worse or any new symptoms  Iron deficiency -     Iron,Total/Total Iron Binding Cap  Other migraine without status migrainosus, not intractable Avoid triggers, controlled  Hypothyroidism, unspecified type Hypothyroidism-check TSH level, continue medications the same, reminded to take on an empty stomach 30-19mins before food.   Rosacea Continue follow up derm  Elevated blood pressure reading without diagnosis of hypertension - continue medications, DASH diet, exercise and monitor at home. Call if greater than 130/80.   Hyperlipidemia, unspecified hyperlipidemia type -continue medications, check lipids, decrease fatty foods, increase activity.   Vitamin D deficiency -     Continue supplement  Medication management  Anemia, unspecified type monitor  Estrogen deficiency dexa due next year  Osteoporosis, unspecified osteoporosis type, unspecified pathological fracture presence Declines foxamax at this time, continue vitamin D   Future Appointments  Date Time Provider [place]  09/27/2018  2:20 PM [facility] 2 [facility]  03/22/2019  2:00 PM Vicie Mutters, PA-C [id number] None  09/20/2019  3:00 PM Vicie Mutters, PA-C [facility] None      Subjective:   Daisy Oliver is a 83 y.o. female who  presents for CPE and 3 month follow up.   She has been having bilateral bicipital muscle cramping at night, intermittent, worse at night, not associated with movement. Worse at night. No tingling, numbness, weakness in her arms. History of cervical fusion C5-C6. Normal MRI/MRA 2008   Her blood pressure has been controlled at home, she is on benicar 20mg , today their BP is BP: 124/78 She does workout.  She denies chest pain, shortness of breath, dizziness.   BMI is Body mass index is 29.7 kg/m., she is working on diet and exercise. Wt Readings from Last 3 Encounters:  09/15/18 173 lb (78.5 kg)  03/17/18 176 lb 6.4 oz (80 kg)  09/14/17 178 lb (80.7 kg)   She is not on cholesterol medication and denies myalgias. Her cholesterol is at goal. The cholesterol last visit was:  Lab Results  Component Value Date   CHOL 189 03/17/2018   HDL 63 03/17/2018   LDLCALC 109 (H) 03/17/2018   TRIG 80 03/17/2018   CHOLHDL 3.0 03/17/2018   Patient is on Vitamin D supplement.   Lab Results  Component Value Date   VD25OH 45 09/14/2017   She follows with Derm, had facial left basal cell carcinoma.  Will drink 1-2 vodka drinks a night.  She is on thyroid medication, cytomel 73mcg (15mcg BID). Her medication was not changed last visit.   Lab Results  Component Value Date   TSH 0.96 03/17/2018  .  She use to follow with Dr. Sharol Roussel for alternative medicine but she has retired.  BMI is Body mass  index is 29.7 kg/m., she is working on diet and exercise. Wt Readings from Last 3 Encounters:  09/15/18 173 lb (78.5 kg)  03/17/18 176 lb 6.4 oz (80 kg)  09/14/17 178 lb (80.7 kg)    Names of Other Physician/Practitioners you currently use: 1. [facility]- here for primary care 2. Dr. Herbert Deaner, eye doctor, last visit last Dec 2019 3.  Dr. Jackalyn Lombard dentist, last visit Nov 2019 Patient Care Team: [name], MD as PCP - General (Internal Medicine) Izora Gala,  MD as Consulting Physician (Otolaryngology) Laurence Spates, MD as Consulting Physician (Gastroenterology) Michel Santee, MD as Consulting Physician (Neurology) Lollie Sails, MD as Referring Physician (Internal Medicine)   Medication Review Current Outpatient Medications on File Prior to Visit  Medication Sig Dispense Refill  . BENICAR 40 MG tablet TAKE 1 TABLET BY MOUTH EVERY DAY 90 tablet 1  . BIOTIN PO Take by mouth daily.    . Calcium-Magnesium-Vitamin D (CALCIUM 1200+D3 PO) Take by mouth.    . clotrimazole-betamethasone (LOTRISONE) cream Apply 1 application topically 2 (two) times daily. 15 g 2  . Cyanocobalamin (VITAMIN B 12 PO) Take by mouth daily.    . Digestive Enzymes (BETAINE HCL PO) Take by mouth. Ortho Biotic    . DiphenhydrAMINE HCl (ALLERGY MED PO) Take by mouth. [facility] - generic allergy med    . Ergocalciferol (VITAMIN D2) 2000 UNITS TABS Take by mouth.    Marland Kitchen L-Methylfolate-Algae-B12-B6 (METANX PO) Take by mouth daily.    Marland Kitchen liothyronine (CYTOMEL) 5 MCG tablet Take 2 tablets (10 mcg total) by mouth 2 (two) times daily. 360 tablet 3  . Magnesium 500 MG CAPS Take by mouth.    . Misc Natural Products (OSTEO BI-FLEX JOINT SHIELD PO) Take by mouth. Osteo Bi-Flex    . NEOMYCIN-POLYMYXIN-HYDROCORTISONE (CORTISPORIN) 1 % SOLN otic solution Place 6 drops into both ears 4 (four) times daily. 10 mL 0  . Omega-3 Fatty Acids (FISH OIL PO) Take by mouth daily. Fish Oil    . OVER THE COUNTER MEDICATION daily. P 5 P50    . OVER THE COUNTER MEDICATION     . oxyCODONE (OXY IR/ROXICODONE) 5 MG immediate release tablet Take 5 mg by mouth every 4 (four) hours as needed for pain.    . Taurine 1000 MG CAPS Take by mouth.    . tretinoin (RETIN-A) 0.05 % cream Apply topically at bedtime. 45 g 0  . Zinc 50 MG CAPS Take by mouth.     No current facility-administered medications on file prior to visit.     Current Problems (verified) Patient Active Problem List   Diagnosis Date Noted  .  History of basal cell cancer 03/17/2018  . Vitamin D deficiency 08/28/2014  . Medication management 08/28/2014  . Hypothyroidism 08/28/2014  . Elevated blood pressure reading without diagnosis of hypertension 08/25/2013  . Hyperlipidemia 08/25/2013  . Migraines 08/25/2013  . Rosacea 08/25/2013   Screening Test and Preventative care:  Immunization History  Administered Date(s) Administered  . DT 08/28/2014  . Pneumococcal Conjugate-13 08/28/2015  . Pneumococcal Polysaccharide-23 03/08/2017   Last colonoscopy: 11/2013 will be last one Last mammogram: 08/2017 Last pap smear/pelvic exam: 2010 defer DEXA: 2017 + osteoporosis t -2.6 Stress test 2010 EF 73% normal US Carotid 2003 CXR 08/2013 MRA head and neck 2008 Ct head 2014  Prior vaccinations: TD or Tdap: 2016  Influenza: declines  Pneumococcal: 2018 Prevnar 13: 2017 Shingles/Zostavax: declines  Allergies Allergies  Allergen Reactions  .  Barbiturates   . Pentothal [Thiopental]   . Sulfa Antibiotics   . Wheat Bran Nausea And Vomiting    SURGICAL HISTORY She  has a past surgical history that includes Radial head arthroplasty (Right, 09/16/2012). FAMILY HISTORY Her family history includes Brain cancer (age of onset: 24) in her father; Cancer in her paternal grandfather; Heart disease in her mother; Hypertension in her sister and sister; Osteoporosis in her sister and sister; Suicidality in her paternal grandfather. SOCIAL HISTORY She  reports that she quit smoking about 35 years ago. Her smoking use included cigarettes. She has a 44.00 pack-year smoking history. She has never used smokeless tobacco. She reports current alcohol use. She reports that she does not use drugs.   Objective:     Blood pressure 124/78, pulse 80, temperature 97.9 F (36.6 C), height 5\' 4"  (1.626 m), weight 173 lb (78.5 kg), SpO2 98 %. Body mass index is 29.7 kg/m.  General appearance: alert, no distress, WD/WN,  female HEENT: normocephalic,  sclerae anicteric, TMs pearly, nares patent, no discharge or erythema, pharynx normal Oral cavity: MMM, no lesions Neck: supple, no lymphadenopathy, no thyromegaly, no masses Heart: RRR, normal S1, S2, no murmurs Lungs: CTA bilaterally, no wheezes, rhonchi, or rales Abdomen: +bs, soft, non tender, non distended, no masses, no hepatomegaly, no splenomegaly Musculoskeletal: nontender, no swelling, no obvious deformity Extremities: no edema, no cyanosis, no clubbing Pulses: 2+ symmetric, upper and lower extremities, normal cap refill Neurological: alert, oriented x 3, CN2-12 intact, strength normal upper extremities and lower extremities, sensation normal throughout, DTRs 2+ bilateral legs but 3-4+ bilateral arms, no cerebellar signs, gait normal Skin: sparse eyebrows bilaterally Psychiatric: normal affect, behavior normal, pleasant  Breast: declines Gyn: declines Rectal: declines   Vicie Mutters, PA-C   09/15/2018

## 2018-09-15 ENCOUNTER — Ambulatory Visit: Payer: Medicare Other | Admitting: Physician Assistant

## 2018-09-15 ENCOUNTER — Other Ambulatory Visit: Payer: Self-pay

## 2018-09-15 ENCOUNTER — Encounter: Payer: Self-pay | Admitting: Physician Assistant

## 2018-09-15 VITALS — BP 124/78 | HR 80 | Temp 97.9°F | Ht 64.0 in | Wt 173.0 lb

## 2018-09-15 DIAGNOSIS — E559 Vitamin D deficiency, unspecified: Secondary | ICD-10-CM

## 2018-09-15 DIAGNOSIS — Z85828 Personal history of other malignant neoplasm of skin: Secondary | ICD-10-CM

## 2018-09-15 DIAGNOSIS — R292 Abnormal reflex: Secondary | ICD-10-CM

## 2018-09-15 DIAGNOSIS — E785 Hyperlipidemia, unspecified: Secondary | ICD-10-CM

## 2018-09-15 DIAGNOSIS — G8929 Other chronic pain: Secondary | ICD-10-CM

## 2018-09-15 DIAGNOSIS — E611 Iron deficiency: Secondary | ICD-10-CM

## 2018-09-15 DIAGNOSIS — E039 Hypothyroidism, unspecified: Secondary | ICD-10-CM

## 2018-09-15 DIAGNOSIS — Z79899 Other long term (current) drug therapy: Secondary | ICD-10-CM

## 2018-09-15 DIAGNOSIS — Z0001 Encounter for general adult medical examination with abnormal findings: Secondary | ICD-10-CM

## 2018-09-15 DIAGNOSIS — Z0000 Encounter for general adult medical examination without abnormal findings: Secondary | ICD-10-CM | POA: Diagnosis not present

## 2018-09-15 DIAGNOSIS — M25511 Pain in right shoulder: Secondary | ICD-10-CM

## 2018-09-15 DIAGNOSIS — L719 Rosacea, unspecified: Secondary | ICD-10-CM

## 2018-09-15 DIAGNOSIS — R030 Elevated blood-pressure reading, without diagnosis of hypertension: Secondary | ICD-10-CM

## 2018-09-15 DIAGNOSIS — E538 Deficiency of other specified B group vitamins: Secondary | ICD-10-CM

## 2018-09-15 DIAGNOSIS — G43809 Other migraine, not intractable, without status migrainosus: Secondary | ICD-10-CM

## 2018-09-15 NOTE — Patient Instructions (Signed)
Shoulder Impingement Syndrome  Shoulder impingement syndrome is a condition that causes pain when connective tissues (tendons) surrounding the shoulder joint become pinched. These tendons are part of the group of muscles and tissues that help to stabilize the shoulder (rotator cuff). Beneath the rotator cuff is a fluid-filled sac (bursa) that allows the muscles and tendons to glide smoothly. The bursa may become swollen or irritated (bursitis). Bursitis, swelling in the rotator cuff tendons, or both conditions can decrease how much space is under a bone in the shoulder joint (acromion), resulting in impingement. What are the causes? Shoulder impingement syndrome may be caused by bursitis or swelling of the rotator cuff tendons, which may result from:  Repetitive overhead arm movements.  Falling onto the shoulder.  Weakness in the shoulder muscles. What increases the risk? You may be more likely to develop this condition if you:  Play sports that involve throwing, such as baseball.  Participate in sports such as tennis, volleyball, and swimming.  Work as a Curator, Games developer, or Architect. Some people are also more likely to develop impingement syndrome because of the shape of their acromion bone. What are the signs or symptoms? The main symptom of this condition is pain on the front or side of the shoulder. The pain may:  Get worse at night.  Wake you up from sleeping. Other symptoms may include:  Tenderness.  Stiffness.  Inability to raise the arm above shoulder level or behind the body.  Weakness. How is this diagnosed? This condition may be diagnosed based on:  Your symptoms and medical history.  A physical exam.  Imaging tests, such as: ? X-rays. ? MRI. ? Ultrasound. How is this treated? This condition may be treated by:  Resting your shoulder and avoiding all activities that cause pain or put stress on the shoulder.  Icing your shoulder.  NSAIDs to help  reduce pain and swelling.  One or more injections of medicines to numb the area and reduce inflammation.  Physical therapy.  Surgery. This may be needed if nonsurgical treatments have not helped. Surgery may involve repairing the rotator cuff, reshaping the acromion, or removing the bursa. Follow these instructions at home: Managing pain, stiffness, and swelling   If directed, put ice on the injured area. ? Put ice in a plastic bag. ? Place a towel between your skin and the bag. ? Leave the ice on for 20 minutes, 2-3 times a day. Activity  Rest and return to your normal activities as told by your health care provider. Ask your health care provider what activities are safe for you.  Do exercises as told by your health care provider. General instructions  Do not use any products that contain nicotine or tobacco, such as cigarettes, e-cigarettes, and chewing tobacco. These can delay healing. If you need help quitting, ask your health care provider.  Ask your health care provider when it is safe for you to drive.  Take over-the-counter and prescription medicines only as told by your health care provider.  Keep all follow-up visits as told by your health care provider. This is important. How is this prevented?  Give your body time to rest between periods of activity.  Be safe and responsible while being active. This will help you avoid falls.  Maintain physical fitness, including strength and flexibility. Contact a health care provider if:  Your symptoms have not improved after 1-2 months of treatment and rest.  You cannot lift your arm away from your body. Summary  Shoulder impingement syndrome is a condition that causes pain when connective tissues (tendons) surrounding the shoulder joint become pinched.  The main symptom of this condition is pain on the front or side of the shoulder.  This condition is usually treated with rest, ice, and pain medicines as needed. This  information is not intended to replace advice given to you by your health care provider. Make sure you discuss any questions you have with your health care provider. Document Released: 03/30/2005 Document Revised: 09/22/2017 Document Reviewed: 09/22/2017 Elsevier Interactive Patient Education  2019 [employer].   Shoulder Impingement Syndrome Rehab Ask your health care provider which exercises are safe for you. Do exercises exactly as told by your health care provider and adjust them as directed. It is normal to feel mild stretching, pulling, tightness, or discomfort as you do these exercises, but you should stop right away if you feel sudden pain or your pain gets worse.Do not begin these exercises until told by your health care provider. Stretching and range of motion exercise This exercise warms up your muscles and joints and improves the movement and flexibility of your shoulder. This exercise also helps to relieve pain and stiffness. Exercise A: Passive horizontal adduction  1. Sit or stand and pull your left / right elbow across your chest, toward your other shoulder. Stop when you feel a gentle stretch in the back of your shoulder and upper arm. ? Keep your arm at shoulder height. ? Keep your arm as close to your body as you comfortably can. 2. Hold for __________ seconds. 3. Slowly return to the starting position. Repeat __________ times. Complete this exercise __________ times a day. Strengthening exercises These exercises build strength and endurance in your shoulder. Endurance is the ability to use your muscles for a long time, even after they get tired. Exercise B: External rotation, isometric 1. Stand or sit in a doorway, facing the door frame. 2. Bend your left / right elbow and place the back of your wrist against the door frame. Only your wrist should be touching the frame. Keep your upper arm at your side. 3. Gently press your wrist against the door frame, as if you are  trying to push your arm away from your abdomen. ? Avoid shrugging your shoulder while you press your hand against the door frame. Keep your shoulder blade tucked down toward the middle of your back. 4. Hold for __________ seconds. 5. Slowly release the tension, and relax your muscles completely before you do the exercise again. Repeat __________ times. Complete this exercise __________ times a day. Exercise C: Internal rotation, isometric  1. Stand or sit in a doorway, facing the door frame. 2. Bend your left / right elbow and place the inside of your wrist against the door frame. Only your wrist should be touching the frame. Keep your upper arm at your side. 3. Gently press your wrist against the door frame, as if you are trying to push your arm toward your abdomen. ? Avoid shrugging your shoulder while you press your hand against the door frame. Keep your shoulder blade tucked down toward the middle of your back. 4. Hold for __________ seconds. 5. Slowly release the tension, and relax your muscles completely before you do the exercise again. Repeat __________ times. Complete this exercise __________ times a day. Exercise D: Scapular protraction, supine  1. Lie on your back on a firm surface. Hold a __________ weight in your left / right hand. 2. Raise your left /  right arm straight into the air so your hand is directly above your shoulder joint. 3. Push the weight into the air so your shoulder lifts off of the surface that you are lying on. Do not move your head, neck, or back. 4. Hold for __________ seconds. 5. Slowly return to the starting position. Let your muscles relax completely before you repeat this exercise. Repeat __________ times. Complete this exercise __________ times a day. Exercise E: Scapular retraction  1. Sit in a stable chair without armrests, or stand. 2. Secure an exercise band to a stable object in front of you so the band is at shoulder height. 3. Hold one end of  the exercise band in each hand. Your palms should face down. 4. Squeeze your shoulder blades together and move your elbows slightly behind you. Do not shrug your shoulders while you do this. 5. Hold for __________ seconds. 6. Slowly return to the starting position. Repeat __________ times. Complete this exercise __________ times a day. Exercise F: Shoulder extension  1. Sit in a stable chair without armrests, or stand. 2. Secure an exercise band to a stable object in front of you where the band is above shoulder height. 3. Hold one end of the exercise band in each hand. 4. Straighten your elbows and lift your hands up to shoulder height. 5. Squeeze your shoulder blades together and pull your hands down to the sides of your thighs. Stop when your hands are straight down by your sides. Do not let your hands go behind your body. 6. Hold for __________ seconds. 7. Slowly return to the starting position. Repeat __________ times. Complete this exercise __________ times a day. This information is not intended to replace advice given to you by your health care provider. Make sure you discuss any questions you have with your health care provider. Document Released: 03/30/2005 Document Revised: 12/05/2015 Document Reviewed: 03/02/2015 Elsevier Interactive Patient Education  2019 [employer].

## 2018-09-20 LAB — VITAMIN B12: Vitamin B-12: >2000 pg/mL — ABNORMAL HIGH (ref 200–1100)

## 2018-09-20 LAB — LIPID PANEL
Cholesterol: 190 mg/dL (ref <200)
HDL: 67 mg/dL (ref >=50)
LDL Cholesterol (Calc): 100 mg/dL (calc) — ABNORMAL HIGH
Non-HDL Cholesterol (Calc): 123 mg/dL (calc) (ref <130)
Total CHOL/HDL Ratio: 2.8 (calc) (ref <5.0)
Triglycerides: 124 mg/dL (ref <150)

## 2018-09-20 LAB — COMPLETE METABOLIC PANEL WITH GFR
AG Ratio: 1.4 (calc) (ref 1.0–2.5)
ALT: 16 U/L (ref 6–29)
AST: 23 U/L (ref 10–35)
Albumin: 4.3 g/dL (ref 3.6–5.1)
Alkaline phosphatase (APISO): 62 U/L (ref 37–153)
BUN: 25 mg/dL (ref 7–25)
CO2: 29 mmol/L (ref 20–32)
Calcium: 9.8 mg/dL (ref 8.6–10.4)
Chloride: 102 mmol/L (ref 98–110)
Creat: 0.76 mg/dL (ref 0.60–0.88)
GFR, Est African American: 86 mL/min/{1.73_m2} (ref >=60)
GFR, Est Non African American: 74 mL/min/{1.73_m2} (ref >=60)
Globulin: 3 g/dL (calc) (ref 1.9–3.7)
Glucose, Bld: 132 mg/dL — ABNORMAL HIGH (ref 65–99)
Potassium: 3.7 mmol/L (ref 3.5–5.3)
Sodium: 141 mmol/L (ref 135–146)
Total Bilirubin: 0.8 mg/dL (ref 0.2–1.2)
Total Protein: 7.3 g/dL (ref 6.1–8.1)

## 2018-09-20 LAB — CBC WITH DIFFERENTIAL/PLATELET
Absolute Monocytes: 675 cells/uL (ref 200–950)
Basophils Absolute: 57 cells/uL (ref 0–200)
Basophils Relative: 0.8 %
Eosinophils Absolute: 149 cells/uL (ref 15–500)
Eosinophils Relative: 2.1 %
HCT: 40.3 % (ref 35.0–45.0)
Hemoglobin: 13.5 g/dL (ref 11.7–15.5)
Lymphs Abs: 1818 cells/uL (ref 850–3900)
MCH: 30.8 pg (ref 27.0–33.0)
MCHC: 33.5 g/dL (ref 32.0–36.0)
MCV: 92 fL (ref 80.0–100.0)
MPV: 9.8 fL (ref 7.5–12.5)
Monocytes Relative: 9.5 %
Neutro Abs: 4402 cells/uL (ref 1500–7800)
Neutrophils Relative %: 62 %
Platelets: 221 10*3/uL (ref 140–400)
RBC: 4.38 10*6/uL (ref 3.80–5.10)
RDW: 12.6 % (ref 11.0–15.0)
Total Lymphocyte: 25.6 %
WBC: 7.1 10*3/uL (ref 3.8–10.8)

## 2018-09-20 LAB — IRON, TOTAL/TOTAL IRON BINDING CAP
%SAT: 23 % (calc) (ref 16–45)
Iron: 75 ug/dL (ref 45–160)
TIBC: 331 mcg/dL (calc) (ref 250–450)

## 2018-09-20 LAB — MAGNESIUM: Magnesium: 2.1 mg/dL (ref 1.5–2.5)

## 2018-09-20 LAB — EHRLICHIA ANTIBODY PANEL
E. CHAFFEENSIS AB IGG: <1:64 {titer}
E. CHAFFEENSIS AB IGM: <1:20 {titer}

## 2018-09-20 LAB — B. BURGDORFI ANTIBODIES: B burgdorferi Ab IgG+IgM: <0.9 index

## 2018-09-20 LAB — SEDIMENTATION RATE: Sed Rate: 22 mm/h (ref 0–30)

## 2018-09-20 LAB — TSH: TSH: 0.88 mIU/L (ref 0.40–4.50)

## 2018-09-20 LAB — VITAMIN D 25 HYDROXY (VIT D DEFICIENCY, FRACTURES): Vit D, 25-Hydroxy: 60 ng/mL (ref 30–100)

## 2018-09-20 LAB — C-REACTIVE PROTEIN: CRP: 1.1 mg/L (ref <8.0)

## 2018-09-20 LAB — T4, FREE: Free T4: 0.5 ng/dL — ABNORMAL LOW (ref 0.8–1.8)

## 2018-09-23 DIAGNOSIS — E039 Hypothyroidism, unspecified: Secondary | ICD-10-CM

## 2018-09-23 MED ORDER — ERYTHROMYCIN 2 % EX SOLN: Freq: Every day | CUTANEOUS | 1 refills | Status: DC

## 2018-09-23 MED ORDER — NEOMYCIN-POLYMYXIN-HC 1 % OT SOLN: 6.0000 [drp] | Freq: Four times a day (QID) | OTIC | 3 refills | Status: DC

## 2018-09-23 MED ORDER — LIOTHYRONINE SODIUM 5 MCG PO TABS: 10.0000 ug | ORAL_TABLET | Freq: Two times a day (BID) | ORAL | 3 refills | Status: DC

## 2018-09-23 NOTE — Addendum Note (Signed)
Addended by: Vicie Mutters R on: 09/23/2018 11:29 AM   Modules accepted: Orders

## 2018-09-27 ENCOUNTER — Other Ambulatory Visit: Payer: Self-pay

## 2018-09-27 ENCOUNTER — Ambulatory Visit
Admission: RE | Admit: 2018-09-27 | Discharge: 2018-09-27 | Disposition: A | Discharge status: Home / Self Care | Payer: Medicare Other | Source: Ambulatory Visit | Attending: Internal Medicine | Admitting: Internal Medicine

## 2018-09-27 DIAGNOSIS — Z1231 Encounter for screening mammogram for malignant neoplasm of breast: Secondary | ICD-10-CM

## 2019-01-09 MED ORDER — MECLIZINE HCL 25 MG PO TABS: ORAL_TABLET | ORAL | 0 refills | Status: DC

## 2019-01-10 ENCOUNTER — Other Ambulatory Visit: Payer: Self-pay | Admitting: Internal Medicine

## 2019-01-10 DIAGNOSIS — R030 Elevated blood-pressure reading, without diagnosis of hypertension: Secondary | ICD-10-CM

## 2019-02-08 LAB — HM DIABETES EYE EXAM

## 2019-02-09 ENCOUNTER — Encounter: Payer: Self-pay | Admitting: *Deleted

## 2019-03-20 NOTE — Progress Notes (Signed)
MEDICARE WELLNESS  Assessment:   Medicare Wellness 1 year  Other migraine without status migrainosus, not intractable Avoid triggers, controlled  Hypothyroidism, unspecified type Hypothyroidism-check TSH level, continue medications the same, reminded to take on an empty stomach 30-12mins before food.   Rosacea Continue follow up derm as needed  Elevated blood pressure reading without diagnosis of hypertension - continue medications, DASH diet, exercise and monitor at home. Call if greater than 130/80.   Hyperlipidemia, unspecified hyperlipidemia type -continue medications, check lipids, decrease fatty foods, increase activity.   Vitamin D deficiency -     Continue supplement  Medication management  Anemia, unspecified type monitor  Osteoporosis, unspecified osteoporosis type, unspecified pathological fracture presence Declines foxamax, continue vitamin D   Future Appointments  Date Time Provider [place]  09/20/2019  3:00 PM Vicie Mutters, PA-C [id number] None      Subjective:   Daisy Oliver is a 83 y.o. female who presents for medicare and 3 month follow up.   Her blood pressure has been controlled at home, she is on benicar 20mg , today their BP is BP: 128/74 She does workout, it has been harder with the pandemic but she is still trying to move.  She denies chest pain, shortness of breath, dizziness.   BMI is Body mass index is 30.04 kg/m. She does have ETOH 1-2 drinks nights, is still trying to work out.  Wt Readings from Last 3 Encounters:  03/22/19 175 lb (79.4 kg)  09/15/18 173 lb (78.5 kg)  03/17/18 176 lb 6.4 oz (80 kg)   She is not on cholesterol medication and denies myalgias. Her cholesterol is at goal. The cholesterol last visit was:  Lab Results  Component Value Date   CHOL 190 09/15/2018   HDL 67 09/15/2018   LDLCALC 100 (H) 09/15/2018   TRIG 124 09/15/2018   CHOLHDL 2.8 09/15/2018   Patient is on Vitamin D supplement.    Lab Results  Component Value Date   VD25OH 60 09/15/2018   She follows with Derm, had facial left basal cell carcinoma.   She is on thyroid medication, cytomel 33mcg (10mcg BID). Her medication was not changed last visit.   Lab Results  Component Value Date   TSH 0.88 09/15/2018  .  She use to follow with Dr. Sharol Roussel for alternative medicine but she has retired, labs done here.   Lab Results  Component Value Date   IRON 75 09/15/2018   TIBC 331 09/15/2018   FERRITIN 42 09/01/2016   Lab Results  Component Value Date   VITAMINB12 >2,000 (H) 09/15/2018     Names of Other Physician/Practitioners you currently use: 1. [facility]- here for primary care 2. Dr. Herbert Deaner, eye doctor, last visit last Dec 2019- has had left cataract removal and going to have right cataract next tuesday 3.  Dr. Jackalyn Lombard dentist, last visit Nov 2019 Patient Care Team: [name], MD as PCP - General (Internal Medicine) Izora Gala, MD as Consulting Physician (Otolaryngology) Laurence Spates, MD as Consulting Physician (Gastroenterology) Michel Santee, MD as Consulting Physician (Neurology) Lollie Sails, MD as Referring Physician (Internal Medicine)   Medication Review  Current Outpatient Medications (Endocrine & Metabolic):  .  liothyronine (CYTOMEL) 5 MCG tablet, Take 2 tablets (10 mcg total) by mouth 2 (two) times daily.  Current Outpatient Medications (Cardiovascular):  .  olmesartan (BENICAR) 40 MG tablet, Take 1 tablet Daily for BP  Current Outpatient Medications (Respiratory):  Marland Kitchen  DiphenhydrAMINE HCl (ALLERGY MED  PO), Take by mouth. [facility] - generic allergy med  Current Outpatient Medications (Analgesics):  .  oxyCODONE (OXY IR/ROXICODONE) 5 MG immediate release tablet, Take 5 mg by mouth every 4 (four) hours as needed for pain.  Current Outpatient Medications (Hematological):  Marland Kitchen  Cyanocobalamin (VITAMIN B 12 PO), Take by mouth  daily.  Current Outpatient Medications (Other):  .  BIOTIN PO, Take by mouth daily. .  Calcium-Magnesium-Vitamin D (CALCIUM 1200+D3 PO), Take by mouth. .  clotrimazole-betamethasone (LOTRISONE) cream, Apply 1 application topically 2 (two) times daily. .  Digestive Enzymes (BETAINE HCL PO), Take by mouth. Ortho Biotic .  Ergocalciferol (VITAMIN D2) 2000 UNITS TABS, Take by mouth. .  erythromycin with ethanol (THERAMYCIN) 2 % external solution, Apply topically daily. Marland Kitchen  L-Methylfolate-Algae-B12-B6 (METANX PO), Take by mouth daily. .  Magnesium 500 MG CAPS, Take by mouth. .  meclizine (ANTIVERT) 25 MG tablet, 1/2-1 pill up to 3 times daily for motion sickness/dizziness .  Misc Natural Products (OSTEO BI-FLEX JOINT SHIELD PO), Take by mouth. Osteo Bi-Flex .  NEOMYCIN-POLYMYXIN-HYDROCORTISONE (CORTISPORIN) 1 % SOLN OTIC solution, Place 6 drops into both ears 4 (four) times daily. .  Omega-3 Fatty Acids (FISH OIL PO), Take by mouth daily. Fish Oil .  OVER THE COUNTER MEDICATION, daily. P 5 P50 .  OVER THE COUNTER MEDICATION,  .  Taurine 1000 MG CAPS, Take by mouth. .  tretinoin (RETIN-A) 0.05 % cream, Apply topically at bedtime. .  Zinc 50 MG CAPS, Take by mouth.  Current Problems (verified) Patient Active Problem List   Diagnosis Date Noted  . History of basal cell cancer 03/17/2018  . Vitamin D deficiency 08/28/2014  . Medication management 08/28/2014  . Hypothyroidism 08/28/2014  . Elevated blood pressure reading without diagnosis of hypertension 08/25/2013  . Hyperlipidemia 08/25/2013  . Migraines 08/25/2013  . Rosacea 08/25/2013   Screening Test and Preventative care:  Immunization History  Administered Date(s) Administered  . DT 08/28/2014  . Pneumococcal Conjugate-13 08/28/2015  . Pneumococcal Polysaccharide-23 03/08/2017   Last colonoscopy: 11/2013 will be last one Last mammogram: 09/2018 Last pap smear/pelvic exam: 2010 defer DEXA: 2017 + osteoporosis t -2.6- need next  year with MGM Stress test 2010 EF 73% normal US Carotid 2003 CXR 08/2013 MRA head and neck 2008 Ct head 2014  Prior vaccinations: TD or Tdap: 2016  Influenza: declines  Pneumococcal: 2018 Prevnar 13: 2017 Shingles/Zostavax: declines  Allergies Allergies  Allergen Reactions  . Barbiturates   . Pentothal [Thiopental]   . Sulfa Antibiotics   . Wheat Bran Nausea And Vomiting    SURGICAL HISTORY She  has a past surgical history that includes Radial head arthroplasty (Right, 09/16/2012) and Cataract extraction, bilateral (2020). FAMILY HISTORY Her family history includes Brain cancer (age of onset: 59) in her father; Cancer in her paternal grandfather; Heart disease in her mother; Hypertension in her sister and sister; Osteoporosis in her sister and sister; Suicidality in her paternal grandfather. SOCIAL HISTORY She  reports that she quit smoking about 35 years ago. Her smoking use included cigarettes. She has a 44.00 pack-year smoking history. She has never used smokeless tobacco. She reports current alcohol use. She reports that she does not use drugs.  MEDICARE WELLNESS OBJECTIVES: Physical activity: Current Exercise Habits: Home exercise routine, Type of exercise: walking;strength training/weights;stretching, Time (Minutes): 20, Frequency (Times/Week): 3, Weekly Exercise (Minutes/Week): 60, Intensity: Mild Cardiac risk factors: Cardiac Risk Factors include: advanced age (>63men, >51 women);dyslipidemia;hypertension;sedentary lifestyle Depression/mood screen:   Depression screen [facility] 2/9 09/15/2018  Decreased Interest 0  Down, Depressed, Hopeless 0  PHQ - 2 Score 0    ADLs:  In your present state of health, do you have any difficulty performing the following activities: 03/22/2019 09/15/2018  Hearing? N N  Vision? N N  Difficulty concentrating or making decisions? N N  Walking or climbing stairs? N N  Dressing or bathing? N N  Doing errands, shopping? N N  Some recent data might  be hidden     Cognitive Testing  Alert? Yes  Normal Appearance?Yes  Oriented to person? Yes  Place? Yes   Time? Yes  Recall of three objects?  Yes  Can perform simple calculations? Yes  Displays appropriate judgment?Yes  Can read the correct time from a watch face?Yes  EOL planning: Does Patient Have a Medical Advance Directive?: Yes Type of Advance Directive: Healthcare Power of Attorney, Living will Does patient want to make changes to medical advance directive?: No - Patient declined Copy of [place] in Chart?: No - copy requested   Objective:     Blood pressure 128/74, pulse 75, temperature 97.7 F (36.5 C), height 5\' 4"  (1.626 m), weight 175 lb (79.4 kg), SpO2 96 %. Body mass index is 30.04 kg/m.  General appearance: alert, no distress, WD/WN,  female HEENT: normocephalic, sclerae anicteric, TMs pearly, nares patent, no discharge or erythema, pharynx normal Oral cavity: MMM, no lesions Neck: supple, no lymphadenopathy, no thyromegaly, no masses Heart: RRR, normal S1, S2, no murmurs Lungs: CTA bilaterally, no wheezes, rhonchi, or rales Abdomen: +bs, soft, non tender, non distended, no masses, no hepatomegaly, no splenomegaly Musculoskeletal: nontender, no swelling, no obvious deformity Extremities: no edema, no cyanosis, no clubbing Pulses: 2+ symmetric, upper and lower extremities, normal cap refill Neurological: alert, oriented x 3, CN2-12 intact, strength normal upper extremities and lower extremities, DTRs 2+ bilateral arms and legs, no cerebellar signs, gait normal Skin: sparse eyebrows bilaterally Psychiatric: normal affect, behavior normal, pleasant  Breast: declines Gyn: declines Rectal: declines   Medicare Attestation I have personally reviewed: The patient's medical and social history Their use of alcohol, tobacco or illicit drugs Their current medications and supplements The patient's functional ability including ADLs,fall risks, home  safety risks, cognitive, and hearing and visual impairment Diet and physical activities Evidence for depression or mood disorders  The patient's weight, height, BMI, and visual acuity have been recorded in the chart.  I have made referrals, counseling, and provided education to the patient based on review of the above and I have provided the patient with a written personalized care plan for preventive services.     Vicie Mutters, PA-C   03/22/2019

## 2019-03-22 ENCOUNTER — Ambulatory Visit: Payer: Medicare Other | Admitting: Physician Assistant

## 2019-03-22 ENCOUNTER — Other Ambulatory Visit: Payer: Self-pay

## 2019-03-22 ENCOUNTER — Encounter: Payer: Self-pay | Admitting: Physician Assistant

## 2019-03-22 VITALS — BP 128/74 | HR 75 | Temp 97.7°F | Ht 64.0 in | Wt 175.0 lb

## 2019-03-22 DIAGNOSIS — G43809 Other migraine, not intractable, without status migrainosus: Secondary | ICD-10-CM

## 2019-03-22 DIAGNOSIS — Z79899 Other long term (current) drug therapy: Secondary | ICD-10-CM

## 2019-03-22 DIAGNOSIS — R030 Elevated blood-pressure reading, without diagnosis of hypertension: Secondary | ICD-10-CM

## 2019-03-22 DIAGNOSIS — Z0000 Encounter for general adult medical examination without abnormal findings: Secondary | ICD-10-CM

## 2019-03-22 DIAGNOSIS — Z85828 Personal history of other malignant neoplasm of skin: Secondary | ICD-10-CM

## 2019-03-22 DIAGNOSIS — L719 Rosacea, unspecified: Secondary | ICD-10-CM

## 2019-03-22 DIAGNOSIS — E785 Hyperlipidemia, unspecified: Secondary | ICD-10-CM | POA: Diagnosis not present

## 2019-03-22 DIAGNOSIS — E538 Deficiency of other specified B group vitamins: Secondary | ICD-10-CM

## 2019-03-22 DIAGNOSIS — Z0001 Encounter for general adult medical examination with abnormal findings: Secondary | ICD-10-CM | POA: Diagnosis not present

## 2019-03-22 DIAGNOSIS — R6889 Other general symptoms and signs: Secondary | ICD-10-CM | POA: Diagnosis not present

## 2019-03-22 DIAGNOSIS — E559 Vitamin D deficiency, unspecified: Secondary | ICD-10-CM | POA: Diagnosis not present

## 2019-03-22 DIAGNOSIS — E039 Hypothyroidism, unspecified: Secondary | ICD-10-CM

## 2019-03-22 DIAGNOSIS — E611 Iron deficiency: Secondary | ICD-10-CM

## 2019-03-22 NOTE — Patient Instructions (Addendum)
Check out [email or web address]org  You can find a registered dietitian near you. At the bottom of the website you can search for a dietitian and there is a filter option by speciality as well.  A dietitian has their master's and are an expert at nutrition.  General eating tips  What to Avoid . Avoid added sugars o Often added sugar can be found in processed foods such as many condiments, dry cereals, cakes, cookies, chips, crisps, crackers, candies, sweetened drinks, etc.  o Read labels and AVOID/DECREASE use of foods with the following in their ingredient list: Sugar, fructose, high fructose corn syrup, sucrose, glucose, maltose, dextrose, molasses, cane sugar, brown sugar, any type of syrup, agave nectar, etc.   . Avoid snacking in between meals- drink water or if you feel you need a snack, pick a high water content snack such as cucumbers, watermelon, or any veggie.  . Avoid foods made with flour o If you are going to eat food made with flour, choose those made with whole-grains; and, minimize your consumption as much as is tolerable . Avoid processed foods o These foods are generally stocked in the middle of the grocery store.  o Focus on shopping on the perimeter of the grocery.  What to Include . Vegetables o GREEN LEAFY VEGETABLES: Kale, spinach, mustard greens, collard greens, cabbage, broccoli, etc. o OTHER: Asparagus, cauliflower, eggplant, carrots, peas, Brussel sprouts, tomatoes, bell peppers, zucchini, beets, cucumbers, etc. . Grains, seeds, and legumes o Beans: kidney beans, black eyed peas, garbanzo beans, black beans, pinto beans, etc. o Whole, unrefined grains: brown rice, barley, bulgur, oatmeal, etc. . Healthy fats  o Avoid highly processed fats such as vegetable oil o Examples of healthy fats: avocado, olives, virgin olive oil, dark chocolate (?72% Cocoa), nuts (peanuts, almonds, walnuts, cashews, pecans, etc.) o Please still do small amount of these healthy fats, they are dense in  calories.  . Low - Moderate Intake of Animal Sources of Protein o Meat sources: chicken, turkey, salmon, tuna. Limit to 4 ounces of meat at one time or the size of your palm. o Consider limiting dairy sources, but when choosing dairy focus on: PLAIN Greek yogurt, cottage cheese, high-protein milk . Fruit o Choose berries   

## 2019-03-23 LAB — CBC WITH DIFFERENTIAL/PLATELET
Absolute Monocytes: 710 cells/uL (ref 200–950)
Basophils Absolute: 67 cells/uL (ref 0–200)
Basophils Relative: 0.9 %
Eosinophils Absolute: 192 cells/uL (ref 15–500)
Eosinophils Relative: 2.6 %
HCT: 40.5 % (ref 35.0–45.0)
Hemoglobin: 13.7 g/dL (ref 11.7–15.5)
Lymphs Abs: 2257 cells/uL (ref 850–3900)
MCH: 30.9 pg (ref 27.0–33.0)
MCHC: 33.8 g/dL (ref 32.0–36.0)
MCV: 91.4 fL (ref 80.0–100.0)
MPV: 9.9 fL (ref 7.5–12.5)
Monocytes Relative: 9.6 %
Neutro Abs: 4174 cells/uL (ref 1500–7800)
Neutrophils Relative %: 56.4 %
Platelets: 211 10*3/uL (ref 140–400)
RBC: 4.43 10*6/uL (ref 3.80–5.10)
RDW: 12.3 % (ref 11.0–15.0)
Total Lymphocyte: 30.5 %
WBC: 7.4 10*3/uL (ref 3.8–10.8)

## 2019-03-23 LAB — COMPLETE METABOLIC PANEL WITH GFR
AG Ratio: 1.3 (calc) (ref 1.0–2.5)
ALT: 17 U/L (ref 6–29)
AST: 23 U/L (ref 10–35)
Albumin: 4.3 g/dL (ref 3.6–5.1)
Alkaline phosphatase (APISO): 61 U/L (ref 37–153)
BUN: 24 mg/dL (ref 7–25)
CO2: 29 mmol/L (ref 20–32)
Calcium: 9.7 mg/dL (ref 8.6–10.4)
Chloride: 102 mmol/L (ref 98–110)
Creat: 0.84 mg/dL (ref 0.60–0.88)
GFR, Est African American: 76 mL/min/{1.73_m2} (ref >=60)
GFR, Est Non African American: 65 mL/min/{1.73_m2} (ref >=60)
Globulin: 3.2 g/dL (calc) (ref 1.9–3.7)
Glucose, Bld: 127 mg/dL — ABNORMAL HIGH (ref 65–99)
Potassium: 3.9 mmol/L (ref 3.5–5.3)
Sodium: 141 mmol/L (ref 135–146)
Total Bilirubin: 0.6 mg/dL (ref 0.2–1.2)
Total Protein: 7.5 g/dL (ref 6.1–8.1)

## 2019-03-23 LAB — TSH: TSH: 1.22 mIU/L (ref 0.40–4.50)

## 2019-03-23 LAB — LIPID PANEL
Cholesterol: 192 mg/dL (ref <200)
HDL: 68 mg/dL (ref >=50)
LDL Cholesterol (Calc): 102 mg/dL (calc) — ABNORMAL HIGH
Non-HDL Cholesterol (Calc): 124 mg/dL (calc) (ref <130)
Total CHOL/HDL Ratio: 2.8 (calc) (ref <5.0)
Triglycerides: 123 mg/dL (ref <150)

## 2019-03-23 LAB — MAGNESIUM: Magnesium: 2.1 mg/dL (ref 1.5–2.5)

## 2019-03-23 LAB — T4, FREE: Free T4: 0.5 ng/dL — ABNORMAL LOW (ref 0.8–1.8)

## 2019-03-23 LAB — VITAMIN D 25 HYDROXY (VIT D DEFICIENCY, FRACTURES): Vit D, 25-Hydroxy: 65 ng/mL (ref 30–100)

## 2019-04-11 ENCOUNTER — Ambulatory Visit (INDEPENDENT_AMBULATORY_CARE_PROVIDER_SITE_OTHER): Payer: Medicare Other

## 2019-04-11 ENCOUNTER — Other Ambulatory Visit: Payer: Self-pay

## 2019-04-11 VITALS — Temp 97.5°F

## 2019-04-11 DIAGNOSIS — Z23 Encounter for immunization: Secondary | ICD-10-CM

## 2019-04-12 ENCOUNTER — Ambulatory Visit: Payer: Medicare Other

## 2019-05-16 ENCOUNTER — Encounter (INDEPENDENT_AMBULATORY_CARE_PROVIDER_SITE_OTHER): Payer: Medicare PPO | Admitting: Ophthalmology

## 2019-05-16 ENCOUNTER — Other Ambulatory Visit: Payer: Self-pay

## 2019-05-16 DIAGNOSIS — H35033 Hypertensive retinopathy, bilateral: Secondary | ICD-10-CM | POA: Diagnosis not present

## 2019-05-16 DIAGNOSIS — I10 Essential (primary) hypertension: Secondary | ICD-10-CM

## 2019-05-16 DIAGNOSIS — H43813 Vitreous degeneration, bilateral: Secondary | ICD-10-CM | POA: Diagnosis not present

## 2019-05-16 DIAGNOSIS — H34832 Tributary (branch) retinal vein occlusion, left eye, with macular edema: Secondary | ICD-10-CM | POA: Diagnosis not present

## 2019-05-18 MED ORDER — AMLODIPINE BESYLATE 5 MG PO TABS: ORAL_TABLET | ORAL | 3 refills | Status: DC

## 2019-06-05 ENCOUNTER — Ambulatory Visit: Payer: Medicare Other | Admitting: Physician Assistant

## 2019-06-05 ENCOUNTER — Other Ambulatory Visit: Payer: Self-pay

## 2019-06-05 ENCOUNTER — Encounter: Payer: Self-pay | Admitting: Physician Assistant

## 2019-06-05 VITALS — BP 124/70 | HR 73 | Temp 97.0°F | Wt 176.0 lb

## 2019-06-05 DIAGNOSIS — R030 Elevated blood-pressure reading, without diagnosis of hypertension: Secondary | ICD-10-CM

## 2019-06-05 DIAGNOSIS — R1011 Right upper quadrant pain: Secondary | ICD-10-CM | POA: Diagnosis not present

## 2019-06-05 MED ORDER — OLMESARTAN MEDOXOMIL 40 MG PO TABS: ORAL_TABLET | ORAL | 1 refills | Status: DC

## 2019-06-05 NOTE — Patient Instructions (Addendum)
Can do salonpas patches from over the counter Please go to the ER if you have any severe AB pain, unable to hold down food/water, blood in stool or vomit, chest pain, shortness of breath, or any worsening symptoms.   Keep an eye on the rash, may be shingles.   YOU CAN CALL TO MAKE AN ULTRASOUND..  I have put in an order for an ultrasound for you to have You can set them up at your convenience by calling this number [phone] You will likely have the ultrasound at [address]  If you have any issues call our office and we will set this up for you.    Shingles  Shingles is an infection. It gives you a painful skin rash and blisters that have fluid in them. Shingles is caused by the same germ (virus) that causes chickenpox. Shingles only happens in people who:  Have had chickenpox.  Have been given a shot of medicine (vaccine) to protect against chickenpox. Shingles is rare in this group. The first symptoms of shingles may be itching, tingling, or pain in an area on your skin. A rash will show on your skin a few days or weeks later. The rash is likely to be on one side of your body. The rash usually has a shape like a belt or a band. Over time, the rash turns into fluid-filled blisters. The blisters will break open, change into scabs, and dry up. Medicines may:  Help with pain and itching.  Help you get better sooner.  Help to prevent long-term problems. Follow these instructions at home: Medicines  Take over-the-counter and prescription medicines only as told by your doctor.  Put on an anti-itch cream or numbing cream where you have a rash, blisters, or scabs. Do this as told by your doctor. Helping with itching and discomfort   Put cold, wet cloths (cold compresses) on the area of the rash or blisters as told by your doctor.  Cool baths can help you feel better. Try adding baking soda or dry oatmeal to the water to lessen itching. Do not bathe in hot  water. Blister and rash care  Keep your rash covered with a loose bandage (dressing).  Wear loose clothing that does not rub on your rash.  Keep your rash and blisters clean. To do this, wash the area with mild soap and cool water as told by your doctor.  Check your rash every day for signs of infection. Check for: ? More redness, swelling, or pain. ? Fluid or blood. ? Warmth. ? Pus or a bad smell.  Do not scratch your rash. Do not pick at your blisters. To help you to not scratch: ? Keep your fingernails clean and cut short. ? Wear gloves or mittens when you sleep, if scratching is a problem. General instructions  Rest as told by your doctor.  Keep all follow-up visits as told by your doctor. This is important.  Wash your hands often with soap and water. If soap and water are not available, use hand sanitizer. Doing this lowers your chance of getting a skin infection caused by germs (bacteria).  Your infection can cause chickenpox in people who have never had chickenpox or never got a shot of chickenpox vaccine. If you have blisters that did not change into scabs yet, try not to touch other people or be around other people, especially: ? Babies. ? Pregnant women. ? Children who have areas of red, itchy,  or rough skin (eczema). ? Very old people who have transplants. ? People who have a long-term (chronic) sickness, like cancer or AIDS. Contact a doctor if:  Your pain does not get better with medicine.  Your pain does not get better after the rash heals.  You have any signs of infection in the rash area. These signs include: ? More redness, swelling, or pain around the rash. ? Fluid or blood coming from the rash. ? The rash area feeling warm to the touch. ? Pus or a bad smell coming from the rash. Get help right away if:  The rash is on your face or nose.  You have pain in your face or pain by your eye.  You lose feeling on one side of your face.  You have trouble  seeing.  You have ear pain, or you have ringing in your ear.  You have a loss of taste.  Your condition gets worse. Summary  Shingles gives you a painful skin rash and blisters that have fluid in them.  Shingles is an infection. It is caused by the same germ (virus) that causes chickenpox.  Keep your rash covered with a loose bandage (dressing). Wear loose clothing that does not rub on your rash.  If you have blisters that did not change into scabs yet, try not to touch other people or be around people. This information is not intended to replace advice given to you by your health care provider. Make sure you discuss any questions you have with your health care provider. Document Revised: 07/22/2018 Document Reviewed: 12/02/2016 Elsevier Patient Education  2020 [place] INFORMATION  Monitor your blood pressure at home, please keep a record and bring that in with you to your next office visit.   Can stop the norvasc and continue the benicar 40 mg daily.   Go to the ER if any CP, SOB, nausea, dizziness, severe HA, changes vision/speech  Testing/Procedures: HOW TO TAKE YOUR BLOOD PRESSURE:  Rest 5 minutes before taking your blood pressure.  Don't smoke or drink caffeinated beverages for at least 30 minutes before.  Take your blood pressure before (not after) you eat.  Sit comfortably with your back supported and both feet on the floor (don't cross your legs).  Elevate your arm to heart level on a table or a desk.  Use the proper sized cuff. It should fit smoothly and snugly around your bare upper arm. There should be enough room to slip a fingertip under the cuff. The bottom edge of the cuff should be 1 inch above the crease of the elbow.  Due to a recent study, SPRINT, we have changed our goal for the systolic or top blood pressure number. Ideally we want your top number at 120.  In the [facility] Trial, 5000 people were randomized to a goal BP of 120 and  5000 people were randomized to a goal BP of less than 140. The patients with the goal BP at 120 had LESS DEMENTIA, LESS HEART ATTACKS, AND LESS STROKES, AS WELL AS OVERALL DECREASED MORTALITY OR DEATH RATE.   There was another study that showed taking your blood pressure medications at night decrease cardiovascular events.  However if you are on a fluid pill, please take this in the morning.   If you are willing, our goal BP is the top number of 120.  Your most recent BP: BP: 124/70   Take your medications faithfully as instructed. Maintain a healthy weight. Get  at least 150 minutes of aerobic exercise per week. Minimize salt intake. Minimize alcohol intake  DASH Eating Plan DASH stands for "Dietary Approaches to Stop Hypertension." The DASH eating plan is a healthy eating plan that has been shown to reduce high blood pressure (hypertension). Additional health benefits may include reducing the risk of type 2 diabetes mellitus, heart disease, and stroke. The DASH eating plan may also help with weight loss. WHAT DO I NEED TO KNOW ABOUT THE DASH EATING PLAN? For the DASH eating plan, you will follow these general guidelines:  Choose foods with a percent daily value for sodium of less than 5% (as listed on the food label).  Use salt-free seasonings or herbs instead of table salt or sea salt.  Check with your health care provider or pharmacist before using salt substitutes.  Eat lower-sodium products, often labeled as "lower sodium" or "no salt added."  Eat fresh foods.  Eat more vegetables, fruits, and low-fat dairy products.  Choose whole grains. Look for the word "whole" as the first word in the ingredient list.  Choose fish and skinless chicken or [place] more often than red meat. Limit fish, poultry, and meat to 6 oz (170 g) each day.  Limit sweets, desserts, sugars, and sugary drinks.  Choose heart-healthy fats.  Limit cheese to 1 oz (28 g) per day.  Eat more home-cooked food  and less restaurant, buffet, and fast food.  Limit fried foods.  Cook foods using methods other than frying.  Limit canned vegetables. If you do use them, rinse them well to decrease the sodium.  When eating at a restaurant, ask that your food be prepared with less salt, or no salt if possible. WHAT FOODS CAN I EAT? Seek help from a dietitian for individual calorie needs. Grains Whole grain or whole wheat bread. Brown rice. Whole grain or whole wheat pasta. Quinoa, bulgur, and whole grain cereals. Low-sodium cereals. Corn or whole wheat flour tortillas. Whole grain cornbread. Whole grain crackers. Low-sodium crackers. Vegetables Fresh or frozen vegetables (raw, steamed, roasted, or grilled). Low-sodium or reduced-sodium tomato and vegetable juices. Low-sodium or reduced-sodium tomato sauce and paste. Low-sodium or reduced-sodium canned vegetables.  Fruits All fresh, canned (in natural juice), or frozen fruits. Meat and Other Protein Products Ground beef (85% or leaner), grass-fed beef, or beef trimmed of fat. Skinless chicken or [place]. Ground chicken or [place]. Pork trimmed of fat. All fish and seafood. Eggs. Dried beans, peas, or lentils. Unsalted nuts and seeds. Unsalted canned beans. Dairy Low-fat dairy products, such as skim or 1% milk, 2% or reduced-fat cheeses, low-fat ricotta or cottage cheese, or plain low-fat yogurt. Low-sodium or reduced-sodium cheeses. Fats and Oils Tub margarines without trans fats. Light or reduced-fat mayonnaise and salad dressings (reduced sodium). Avocado. Safflower, olive, or canola oils. Natural peanut or almond butter. Other Unsalted popcorn and pretzels. The items listed above may not be a complete list of recommended foods or beverages. Contact your dietitian for more options. WHAT FOODS ARE NOT RECOMMENDED? Grains White bread. White pasta. White rice. Refined cornbread. Bagels and croissants. Crackers that contain trans fat. Vegetables Creamed or  fried vegetables. Vegetables in a cheese sauce. Regular canned vegetables. Regular canned tomato sauce and paste. Regular tomato and vegetable juices. Fruits Dried fruits. Canned fruit in light or heavy syrup. Fruit juice. Meat and Other Protein Products Fatty cuts of meat. Ribs, chicken wings, bacon, sausage, bologna, salami, chitterlings, fatback, hot dogs, bratwurst, and packaged luncheon meats. Salted nuts and seeds. Canned beans  with salt. Dairy Whole or 2% milk, cream, half-and-half, and cream cheese. Whole-fat or sweetened yogurt. Full-fat cheeses or blue cheese. Nondairy creamers and whipped toppings. Processed cheese, cheese spreads, or cheese curds. Condiments Onion and garlic salt, seasoned salt, table salt, and sea salt. Canned and packaged gravies. Worcestershire sauce. Tartar sauce. Barbecue sauce. Teriyaki sauce. Soy sauce, including reduced sodium. Steak sauce. Fish sauce. Oyster sauce. Cocktail sauce. Horseradish. Ketchup and mustard. Meat flavorings and tenderizers. Bouillon cubes. Hot sauce. Tabasco sauce. Marinades. Taco seasonings. Relishes. Fats and Oils Butter, stick margarine, lard, shortening, ghee, and bacon fat. Coconut, palm kernel, or palm oils. Regular salad dressings. Other Pickles and olives. Salted popcorn and pretzels. The items listed above may not be a complete list of foods and beverages to avoid. Contact your dietitian for more information. WHERE CAN I FIND MORE INFORMATION? National Heart, Lung, and Blood Institute: travelstabloid.com Document Released: 03/19/2011 Document Revised: 08/14/2013 Document Reviewed: 02/01/2013 [facility] Patient Information 2015 [place], [place]. This information is not intended to replace advice given to you by your health care provider. Make sure you discuss any questions you have with your health care provider.

## 2019-06-05 NOTE — Progress Notes (Signed)
Subjective:    Patient ID: Daisy Oliver, female    DOB: [birth year], 84 y.o.   MRN: [id number]  HPI 84 y.o. WF with history of migraines, chol, HTN, hypothyroidism, history of ulcer in her 30's presents with stomach and back pain over the weekend, states started right upper AB pain radiates around to her back x Thursday.   Started Thursday day evening AB pain started, no injury, no fall, did zumba Wednesday. Then Friday radiated around to her back.   Worse with lying down. Intermittent intense dull ache, burning at time.  No nausea, no diarrhea constipation, urinary symptoms, fever, chills.  Has taken OTC pain meds, tyelnol and pepcid without help. Heating pad and icy hot helps helps some.   Colonoscopy 2015 CXR 2015 DEXA 2017 showing osteoporosis  Has not had shingles vaccine.   Has had 2 shots of COVID vaccine, 2nd shot Thursday, she has had an increase in her BP since the 1st shot. She is on benicar 40 mg and norvasc 5 mg 1/2 pill. Occ low BP in the AM.   Blood pressure 124/70, pulse 73, temperature (!) 97 F (36.1 C), weight 176 lb (79.8 kg), SpO2 97 %.  Medications  Current Outpatient Medications (Endocrine & Metabolic):  .  liothyronine (CYTOMEL) 5 MCG tablet, Take 2 tablets (10 mcg total) by mouth 2 (two) times daily.  Current Outpatient Medications (Cardiovascular):  .  amLODipine (NORVASC) 5 MG tablet, 1/2-1 tablet QHS for blood pressure .  olmesartan (BENICAR) 40 MG tablet, Take 1 tablet Daily for BP  Current Outpatient Medications (Respiratory):  Marland Kitchen  DiphenhydrAMINE HCl (ALLERGY MED PO), Take by mouth. [facility] - generic allergy med   Current Outpatient Medications (Hematological):  Marland Kitchen  Cyanocobalamin (VITAMIN B 12 PO), Take by mouth daily.  Current Outpatient Medications (Other):  .  BIOTIN PO, Take by mouth daily. .  Calcium-Magnesium-Vitamin D (CALCIUM 1200+D3 PO), Take by mouth. .  clotrimazole-betamethasone (LOTRISONE) cream, Apply 1 application topically 2  (two) times daily. .  Digestive Enzymes (BETAINE HCL PO), Take by mouth. Ortho Biotic .  Ergocalciferol (VITAMIN D2) 2000 UNITS TABS, Take by mouth. .  erythromycin with ethanol (THERAMYCIN) 2 % external solution, Apply topically daily. Marland Kitchen  L-Methylfolate-Algae-B12-B6 (METANX PO), Take by mouth daily. .  Magnesium 500 MG CAPS, Take by mouth. .  meclizine (ANTIVERT) 25 MG tablet, 1/2-1 pill up to 3 times daily for motion sickness/dizziness .  Misc Natural Products (OSTEO BI-FLEX JOINT SHIELD PO), Take by mouth. Osteo Bi-Flex .  NEOMYCIN-POLYMYXIN-HYDROCORTISONE (CORTISPORIN) 1 % SOLN OTIC solution, Place 6 drops into both ears 4 (four) times daily. .  Omega-3 Fatty Acids (FISH OIL PO), Take by mouth daily. Fish Oil .  OVER THE COUNTER MEDICATION, daily. P 5 P50 .  OVER THE COUNTER MEDICATION,  .  Taurine 1000 MG CAPS, Take by mouth. .  tretinoin (RETIN-A) 0.05 % cream, Apply topically at bedtime. .  Zinc 50 MG CAPS, Take by mouth.  Problem list She has Elevated blood pressure reading without diagnosis of hypertension; Hyperlipidemia; Migraines; Rosacea; Vitamin D deficiency; Medication management; Hypothyroidism; and History of basal cell cancer on their problem list.   Review of Systems  Constitutional: Negative.  Negative for chills, fatigue and fever.  HENT: Negative.   Respiratory: Negative.  Negative for cough and shortness of breath.   Cardiovascular: Negative.  Negative for chest pain, palpitations and leg swelling.  Gastrointestinal: Positive for abdominal pain. Negative for abdominal distention, anal bleeding, blood in stool, constipation,  diarrhea, nausea, rectal pain and vomiting.  Genitourinary: Negative.   Musculoskeletal: Positive for back pain. Negative for arthralgias, gait problem, joint swelling, myalgias, neck pain and neck stiffness.  Skin: Positive for rash. Negative for color change, pallor and wound.       Objective:   Physical Exam Constitutional:       Appearance: She is well-developed.  HENT:     Head: Normocephalic and atraumatic.  Eyes:     Conjunctiva/sclera: Conjunctivae normal.     Pupils: Pupils are equal, round, and reactive to light.  Cardiovascular:     Rate and Rhythm: Normal rate and regular rhythm.  Pulmonary:     Effort: Pulmonary effort is normal.     Breath sounds: Normal breath sounds.  Abdominal:     General: Bowel sounds are normal.     Palpations: Abdomen is soft.     Tenderness: There is abdominal tenderness in the right upper quadrant. There is no right CVA tenderness, left CVA tenderness, guarding or rebound. Negative signs include Murphy's sign and Rovsing's sign.  Musculoskeletal:        General: No tenderness. Normal range of motion.     Cervical back: Normal range of motion and neck supple.  Lymphadenopathy:     Cervical: No cervical adenopathy.  Skin:    General: Skin is warm and dry.     Findings: Rash present.     Comments: Erythematous nodules along dermatome T7-8  Neurological:     Mental Status: She is alert and oriented to person, place, and time.     Deep Tendon Reflexes: Reflexes are normal and symmetric.               Assessment & Plan:  Zahrah was seen today for acute visit, abdominal pain and back pain.  Diagnoses and all orders for this visit:  Right upper quadrant abdominal pain + rash but patient states it is a burn from a heating pad, declines treatment for shingles, will proceed with work up but if the rash gets worse she will call the office for medication Will get [place] and labs to rule out gallstone, pancrease, kidney stone Please go to the ER if you have any severe AB pain, unable to hold down food/water, blood in stool or vomit, chest pain, shortness of breath, or any worsening symptoms.  -     US Abdomen Complete; Future -     Amylase -     CBC with Differential/Platelet -     COMPLETE METABOLIC PANEL WITH GFR -     Urinalysis, Routine w reflex microscopic -     Urine  Culture -     Lipase  Elevated blood pressure reading without diagnosis of hypertension -     olmesartan (BENICAR) 40 MG tablet; Take 1 tablet Daily for BP - can stop norvasc and monitor BP at home

## 2019-06-06 ENCOUNTER — Encounter (INDEPENDENT_AMBULATORY_CARE_PROVIDER_SITE_OTHER): Payer: Medicare PPO | Admitting: Ophthalmology

## 2019-06-06 LAB — URINE CULTURE
MICRO NUMBER:: 10175827
SPECIMEN QUALITY:: ADEQUATE

## 2019-06-06 LAB — COMPLETE METABOLIC PANEL WITH GFR
AG Ratio: 1.3 (calc) (ref 1.0–2.5)
ALT: 17 U/L (ref 6–29)
AST: 22 U/L (ref 10–35)
Albumin: 4.4 g/dL (ref 3.6–5.1)
Alkaline phosphatase (APISO): 56 U/L (ref 37–153)
BUN: 21 mg/dL (ref 7–25)
CO2: 31 mmol/L (ref 20–32)
Calcium: 10 mg/dL (ref 8.6–10.4)
Chloride: 100 mmol/L (ref 98–110)
Creat: 0.82 mg/dL (ref 0.60–0.88)
GFR, Est African American: 78 mL/min/{1.73_m2} (ref >=60)
GFR, Est Non African American: 67 mL/min/{1.73_m2} (ref >=60)
Globulin: 3.3 g/dL (calc) (ref 1.9–3.7)
Glucose, Bld: 99 mg/dL (ref 65–99)
Potassium: 4.8 mmol/L (ref 3.5–5.3)
Sodium: 138 mmol/L (ref 135–146)
Total Bilirubin: 0.8 mg/dL (ref 0.2–1.2)
Total Protein: 7.7 g/dL (ref 6.1–8.1)

## 2019-06-06 LAB — CBC WITH DIFFERENTIAL/PLATELET
Absolute Monocytes: 562 cells/uL (ref 200–950)
Basophils Absolute: 58 cells/uL (ref 0–200)
Basophils Relative: 1.1 %
Eosinophils Absolute: 111 cells/uL (ref 15–500)
Eosinophils Relative: 2.1 %
HCT: 41.3 % (ref 35.0–45.0)
Hemoglobin: 14 g/dL (ref 11.7–15.5)
Lymphs Abs: 1516 cells/uL (ref 850–3900)
MCH: 31.2 pg (ref 27.0–33.0)
MCHC: 33.9 g/dL (ref 32.0–36.0)
MCV: 92 fL (ref 80.0–100.0)
MPV: 10 fL (ref 7.5–12.5)
Monocytes Relative: 10.6 %
Neutro Abs: 3053 cells/uL (ref 1500–7800)
Neutrophils Relative %: 57.6 %
Platelets: 216 10*3/uL (ref 140–400)
RBC: 4.49 10*6/uL (ref 3.80–5.10)
RDW: 12.1 % (ref 11.0–15.0)
Total Lymphocyte: 28.6 %
WBC: 5.3 10*3/uL (ref 3.8–10.8)

## 2019-06-06 LAB — URINALYSIS, ROUTINE W REFLEX MICROSCOPIC
Bacteria, UA: NONE SEEN /HPF
Bilirubin Urine: NEGATIVE
Glucose, UA: NEGATIVE
Hgb urine dipstick: NEGATIVE
Hyaline Cast: NONE SEEN /LPF
Ketones, ur: NEGATIVE
Nitrite: NEGATIVE
Protein, ur: NEGATIVE
RBC / HPF: NONE SEEN /HPF (ref 0–2)
Specific Gravity, Urine: 1.014 (ref 1.001–1.03)
WBC, UA: NONE SEEN /HPF (ref 0–5)
pH: 7 (ref 5.0–8.0)

## 2019-06-06 LAB — AMYLASE: Amylase: 62 U/L (ref 21–101)

## 2019-06-06 LAB — LIPASE: Lipase: 30 U/L (ref 7–60)

## 2019-06-06 MED ORDER — PREDNISONE 20 MG PO TABS: ORAL_TABLET | ORAL | 0 refills | Status: DC

## 2019-06-06 MED ORDER — GABAPENTIN 100 MG PO CAPS: 100.0000 mg | ORAL_CAPSULE | Freq: Two times a day (BID) | ORAL | 2 refills | Status: DC

## 2019-06-06 MED ORDER — ACYCLOVIR 400 MG PO TABS: ORAL_TABLET | ORAL | 0 refills | Status: DC

## 2019-06-07 ENCOUNTER — Other Ambulatory Visit: Payer: Self-pay

## 2019-06-07 ENCOUNTER — Encounter (INDEPENDENT_AMBULATORY_CARE_PROVIDER_SITE_OTHER): Payer: Medicare PPO | Admitting: Ophthalmology

## 2019-06-07 DIAGNOSIS — H35033 Hypertensive retinopathy, bilateral: Secondary | ICD-10-CM

## 2019-06-07 DIAGNOSIS — H43813 Vitreous degeneration, bilateral: Secondary | ICD-10-CM

## 2019-06-07 DIAGNOSIS — I10 Essential (primary) hypertension: Secondary | ICD-10-CM | POA: Diagnosis not present

## 2019-06-07 DIAGNOSIS — H348322 Tributary (branch) retinal vein occlusion, left eye, stable: Secondary | ICD-10-CM | POA: Diagnosis not present

## 2019-06-14 ENCOUNTER — Ambulatory Visit
Admission: RE | Admit: 2019-06-14 | Discharge: 2019-06-14 | Disposition: A | Discharge status: Home / Self Care | Payer: Medicare PPO | Source: Ambulatory Visit | Attending: Physician Assistant | Admitting: Physician Assistant

## 2019-06-14 DIAGNOSIS — R1011 Right upper quadrant pain: Secondary | ICD-10-CM

## 2019-07-13 DIAGNOSIS — R030 Elevated blood-pressure reading, without diagnosis of hypertension: Secondary | ICD-10-CM

## 2019-07-17 ENCOUNTER — Encounter (INDEPENDENT_AMBULATORY_CARE_PROVIDER_SITE_OTHER): Payer: Medicare PPO | Admitting: Ophthalmology

## 2019-07-17 ENCOUNTER — Other Ambulatory Visit: Payer: Self-pay

## 2019-07-17 DIAGNOSIS — H348322 Tributary (branch) retinal vein occlusion, left eye, stable: Secondary | ICD-10-CM | POA: Diagnosis not present

## 2019-07-17 DIAGNOSIS — H35033 Hypertensive retinopathy, bilateral: Secondary | ICD-10-CM | POA: Diagnosis not present

## 2019-07-17 DIAGNOSIS — I10 Essential (primary) hypertension: Secondary | ICD-10-CM

## 2019-07-17 DIAGNOSIS — H43813 Vitreous degeneration, bilateral: Secondary | ICD-10-CM

## 2019-07-19 ENCOUNTER — Encounter (INDEPENDENT_AMBULATORY_CARE_PROVIDER_SITE_OTHER): Payer: Medicare PPO | Admitting: Ophthalmology

## 2019-07-20 MED ORDER — OLMESARTAN MEDOXOMIL 40 MG PO TABS: ORAL_TABLET | ORAL | 1 refills | Status: DC

## 2019-07-27 MED ORDER — OLMESARTAN MEDOXOMIL 40 MG PO TABS: ORAL_TABLET | ORAL | 1 refills | Status: DC

## 2019-07-27 NOTE — Addendum Note (Signed)
Addended by: Vicie Mutters R on: 07/27/2019 08:50 AM   Modules accepted: Orders

## 2019-07-28 ENCOUNTER — Other Ambulatory Visit: Payer: Self-pay | Admitting: Physician Assistant

## 2019-07-28 DIAGNOSIS — R030 Elevated blood-pressure reading, without diagnosis of hypertension: Secondary | ICD-10-CM

## 2019-07-28 MED ORDER — OLMESARTAN MEDOXOMIL 40 MG PO TABS: ORAL_TABLET | ORAL | 0 refills | Status: DC

## 2019-07-29 MED ORDER — OLMESARTAN MEDOXOMIL 40 MG PO TABS: 40.0000 mg | ORAL_TABLET | Freq: Every day | ORAL | 1 refills | Status: DC

## 2019-07-29 NOTE — Addendum Note (Signed)
Addended by: Vicie Mutters R on: 07/29/2019 03:17 PM   Modules accepted: Orders

## 2019-08-21 ENCOUNTER — Other Ambulatory Visit: Payer: Self-pay | Admitting: Internal Medicine

## 2019-08-21 DIAGNOSIS — Z1231 Encounter for screening mammogram for malignant neoplasm of breast: Secondary | ICD-10-CM

## 2019-08-31 ENCOUNTER — Other Ambulatory Visit: Payer: Self-pay

## 2019-08-31 ENCOUNTER — Encounter (INDEPENDENT_AMBULATORY_CARE_PROVIDER_SITE_OTHER): Payer: Medicare PPO | Admitting: Ophthalmology

## 2019-08-31 DIAGNOSIS — H35033 Hypertensive retinopathy, bilateral: Secondary | ICD-10-CM

## 2019-08-31 DIAGNOSIS — I10 Essential (primary) hypertension: Secondary | ICD-10-CM

## 2019-08-31 DIAGNOSIS — H348322 Tributary (branch) retinal vein occlusion, left eye, stable: Secondary | ICD-10-CM

## 2019-08-31 DIAGNOSIS — H43813 Vitreous degeneration, bilateral: Secondary | ICD-10-CM | POA: Diagnosis not present

## 2019-09-19 NOTE — Progress Notes (Signed)
CPE  Assessment:   CPE 1 year  Other migraine without status migrainosus, not intractable Avoid triggers, controlled  Hypothyroidism, unspecified type Hypothyroidism-check TSH level, continue medications the same, reminded to take on an empty stomach 30-64mns before food.   Rosacea Continue follow up derm as needed  hypertension - continue medications, DASH diet, exercise and monitor at home. Call if greater than 130/80.   Hyperlipidemia, unspecified hyperlipidemia type -continue medications, check lipids, decrease fatty foods, increase activity.   Vitamin D deficiency -     Continue supplement  Medication management  Anemia, unspecified type monitor  Osteoporosis, unspecified osteoporosis type, unspecified pathological fracture presence Declines foxamax, continue vitamin D [facility] Future [facility]ointments  Date Time Provider DPoole 10/26/2019  2:00 PM MHayden Pedro MD [facility] None  04/01/2020  2:00 PM CVicie Mutters PA-C [facility] None  09/19/2020  3:00 PM CVicie Mutters PA-C [facility] None      Subjective:   Daisy Leiberis a 84y.o. female who presents for CPE and 3 month follow up.   Her blood pressure has been controlled at home, she is on olmestartan '20mg'$ , went up to 40 mg after her COVID vaccines, today their BP is BP: 126/84 She does workout, it has been harder with the pandemic but she is still trying to move.  She denies chest pain, shortness of breath, dizziness.   BMI is Body mass index is 27.64 kg/m. She has been doing better with her diet. She met with a RD and has been doing well, she is recording what she is doing online.  Wt Readings from Last 3 Encounters:  09/20/19 161 lb (73 kg)  06/05/19 176 lb (79.8 kg)  03/22/19 175 lb (79.4 kg)   She is not on cholesterol medication and denies myalgias. Her cholesterol is at goal. The cholesterol last visit was:  Lab Results  Component Value Date   CHOL 192  03/22/2019   HDL 68 03/22/2019   LDLCALC 102 (H) 03/22/2019   TRIG 123 03/22/2019   CHOLHDL 2.8 03/22/2019   Patient is on Vitamin D supplement.   Lab Results  Component Value Date   VD25OH 65 03/22/2019   She follows with Derm, had facial left basal cell carcinoma.   She is on thyroid medication, cytomel 570m (109mBID). Her medication was not changed last visit.   Lab Results  Component Value Date   TSH 1.22 03/22/2019  .  She use to follow with Dr. VauSharol Rousselr alternative medicine but she has retired, labs done here.   Lab Results  Component Value Date   IRON 75 09/15/2018   TIBC 331 09/15/2018   FERRITIN 42 09/01/2016   Lab Results  Component Value Date   VITAMINB12 >2,000 (H) 09/15/2018     Names of Other Physician/Practitioners you currently use: 1. [facility]- here for primary care Following with Dr. MatZigmund Danielr retinal hemorrhage x Feb 27th  2. Dr. HecHerbert Deanerye doctor, last visit - s/p bilateral cataract surgery. 3.  Dr. JudJackalyn Lombardntist, last visit Nov 2019 Patient Care Team: McKUnk PintoD as PCP - General (Internal Medicine) RosIzora GalaD as Consulting Physician (Otolaryngology) EdwLaurence SpatesD (Inactive) as Consulting Physician (Gastroenterology) LewMichel SanteeD as Consulting Physician (Neurology) VauLollie SailsD as Referring Physician (Internal Medicine)   Medication Review  Current Outpatient Medications (Endocrine & Metabolic):  .  liothyronine (CYTOMEL) 5 MCG tablet, Take 2 tablets (10 mcg total)  by mouth 2 (two) times daily.  Current Outpatient Medications (Cardiovascular):  .  olmesartan (BENICAR) 40 MG tablet, Take 1 tablet (40 mg total) by mouth daily.  Current Outpatient Medications (Respiratory):  Marland Kitchen  DiphenhydrAMINE HCl (ALLERGY MED PO), Take by mouth. [facility] - generic allergy med   Current Outpatient Medications (Hematological):  Marland Kitchen  Cyanocobalamin (VITAMIN B 12 PO), Take by  mouth daily.  Current Outpatient Medications (Other):  .  acyclovir (ZOVIRAX) 400 MG tablet, Take 1 pill 3 times daily with food and 2 pills at night .  BIOTIN PO, Take by mouth daily. .  Calcium-Magnesium-Vitamin D (CALCIUM 1200+D3 PO), Take by mouth. .  clotrimazole-betamethasone (LOTRISONE) cream, Apply 1 application topically 2 (two) times daily. .  Digestive Enzymes (BETAINE HCL PO), Take by mouth. Ortho Biotic .  Ergocalciferol (VITAMIN D2) 2000 UNITS TABS, Take by mouth. .  erythromycin with ethanol (THERAMYCIN) 2 % external solution, Apply topically daily. Marland Kitchen  gabapentin (NEURONTIN) 100 MG capsule, Take 1 capsule (100 mg total) by mouth 2 (two) times daily. Marland Kitchen  L-Methylfolate-Algae-B12-B6 (METANX PO), Take by mouth daily. .  Magnesium 500 MG CAPS, Take by mouth. .  meclizine (ANTIVERT) 25 MG tablet, 1/2-1 pill up to 3 times daily for motion sickness/dizziness .  Misc Natural Products (OSTEO BI-FLEX JOINT SHIELD PO), Take by mouth. Osteo Bi-Flex .  NEOMYCIN-POLYMYXIN-HYDROCORTISONE (CORTISPORIN) 1 % SOLN OTIC solution, Place 6 drops into both ears 4 (four) times daily. .  Omega-3 Fatty Acids (FISH OIL PO), Take by mouth daily. Fish Oil .  OVER THE COUNTER MEDICATION, daily. P 5 P50 .  OVER THE COUNTER MEDICATION,  .  Taurine 1000 MG CAPS, Take by mouth. .  tretinoin (RETIN-A) 0.05 % cream, Apply topically at bedtime. .  Zinc 50 MG CAPS, Take by mouth.  Current Problems (verified) Patient Active Problem List   Diagnosis Date Noted  . History of basal cell cancer 03/17/2018  . Vitamin D deficiency 08/28/2014  . Medication management 08/28/2014  . Hypothyroidism 08/28/2014  . Elevated blood pressure reading without diagnosis of hypertension 08/25/2013  . Hyperlipidemia 08/25/2013  . Migraines 08/25/2013  . Rosacea 08/25/2013   Screening Test and Preventative care:  Immunization History  Administered Date(s) Administered  . DT (Pediatric) 08/28/2014  . Influenza, High Dose  Seasonal PF 04/11/2019  . Pneumococcal Conjugate-13 08/28/2015  . Pneumococcal Polysaccharide-23 03/08/2017   Health Maintenance  Topic Date Due  . COVID-19 Vaccine (1) Never done  . TETANUS/TDAP  08/27/2024  . DEXA SCAN  Completed  . PNA vac Low Risk Adult  Completed  . INFLUENZA VACCINE  Discontinued   Last colonoscopy: 11/2013 will be last one Last mammogram: 09/2018 due 2022 Last pap smear/pelvic exam: 2010 defer DEXA: 2017 + osteoporosis t -2.6- need next year with MGM Stress test 2010 EF 73% normal US Carotid 2003 CXR 08/2013 MRA head and neck 2008 Ct head 2014 [place] AN 06/2019  Prior vaccinations: TD or Tdap: 2016  Influenza: declines  Pneumococcal: 2018 Prevnar 13: 2017 Shingles/Zostavax: declines  Allergies Allergies  Allergen Reactions  . Barbiturates   . Pentothal [Thiopental]   . Sulfa Antibiotics   . Wheat Bran Nausea And Vomiting    SURGICAL HISTORY She  has a past surgical history that includes Radial head arthroplasty (Right, 09/16/2012) and Cataract extraction, bilateral (2020). FAMILY HISTORY Her family history includes Brain cancer (age of onset: 53) in her father; Cancer in her paternal grandfather; Heart disease in her mother; Hypertension in her sister and sister;  Osteoporosis in her sister and sister; Suicidality in her paternal grandfather. SOCIAL HISTORY She  reports that she quit smoking about 36 years ago. Her smoking use included cigarettes. She has a 44.00 pack-year smoking history. She has never used smokeless tobacco. She reports current alcohol use. She reports that she does not use drugs.   Objective:     Blood pressure 126/84, pulse 67, temperature (!) 97.5 F (36.4 C), height 5' 4" (1.626 m), weight 161 lb (73 kg), SpO2 97 %. Body mass index is 27.64 kg/m.  General appearance: alert, no distress, WD/WN,  female HEENT: normocephalic, sclerae anicteric, TMs pearly, nares patent, no discharge or erythema, pharynx normal Oral cavity:  MMM, no lesions Neck: supple, no lymphadenopathy, no thyromegaly, no masses Heart: RRR, normal S1, S2, no murmurs Lungs: CTA bilaterally, no wheezes, rhonchi, or rales Abdomen: +bs, soft, non tender, non distended, no masses, no hepatomegaly, no splenomegaly Musculoskeletal: nontender, no swelling, no obvious deformity Extremities: no edema, no cyanosis, no clubbing Pulses: 2+ symmetric, upper and lower extremities, normal cap refill Neurological: alert, oriented x 3, CN2-12 intact, strength normal upper extremities and lower extremities, DTRs 2+ bilateral arms and legs, no cerebellar signs, gait normal Skin: sparse eyebrows bilaterally Psychiatric: normal affect, behavior normal, pleasant  Breast: declines Gyn: declines Rectal: declines   Medicare Attestation I have personally reviewed: The patient's medical and social history Their use of alcohol, tobacco or illicit drugs Their current medications and supplements The patient's functional ability including ADLs,fall risks, home safety risks, cognitive, and hearing and visual impairment Diet and physical activities Evidence for depression or mood disorders  The patient's weight, height, BMI, and visual acuity have been recorded in the chart.  I have made referrals, counseling, and provided education to the patient based on review of the above and I have provided the patient with a written personalized care plan for preventive services.     Vicie Mutters, PA-C   09/20/2019

## 2019-09-20 ENCOUNTER — Ambulatory Visit: Payer: Medicare Other | Admitting: Physician Assistant

## 2019-09-20 ENCOUNTER — Other Ambulatory Visit: Payer: Self-pay

## 2019-09-20 ENCOUNTER — Encounter: Payer: Self-pay | Admitting: Physician Assistant

## 2019-09-20 VITALS — BP 126/84 | HR 67 | Temp 97.5°F | Ht 64.0 in | Wt 161.0 lb

## 2019-09-20 DIAGNOSIS — Z136 Encounter for screening for cardiovascular disorders: Secondary | ICD-10-CM

## 2019-09-20 DIAGNOSIS — E785 Hyperlipidemia, unspecified: Secondary | ICD-10-CM | POA: Diagnosis not present

## 2019-09-20 DIAGNOSIS — L719 Rosacea, unspecified: Secondary | ICD-10-CM

## 2019-09-20 DIAGNOSIS — I10 Essential (primary) hypertension: Secondary | ICD-10-CM

## 2019-09-20 DIAGNOSIS — Z85828 Personal history of other malignant neoplasm of skin: Secondary | ICD-10-CM

## 2019-09-20 DIAGNOSIS — E559 Vitamin D deficiency, unspecified: Secondary | ICD-10-CM | POA: Diagnosis not present

## 2019-09-20 DIAGNOSIS — Z0000 Encounter for general adult medical examination without abnormal findings: Secondary | ICD-10-CM | POA: Diagnosis not present

## 2019-09-20 DIAGNOSIS — Z0001 Encounter for general adult medical examination with abnormal findings: Secondary | ICD-10-CM | POA: Diagnosis not present

## 2019-09-20 DIAGNOSIS — G43809 Other migraine, not intractable, without status migrainosus: Secondary | ICD-10-CM

## 2019-09-20 DIAGNOSIS — Z79899 Other long term (current) drug therapy: Secondary | ICD-10-CM

## 2019-09-20 DIAGNOSIS — E2839 Other primary ovarian failure: Secondary | ICD-10-CM

## 2019-09-20 DIAGNOSIS — E039 Hypothyroidism, unspecified: Secondary | ICD-10-CM

## 2019-09-20 NOTE — Patient Instructions (Signed)
Will get DEXA next year   Osteoporosis  Osteoporosis is thinning and loss of density in your bones. Osteoporosis makes bones more brittle and fragile and more likely to break (fracture). Over time, osteoporosis can cause your bones to become so weak that they fracture after a minor fall. Bones in the hip, wrist, and spine are most likely to fracture due to osteoporosis. What are the causes? The exact cause of this condition is not known. What increases the risk? You may be at greater risk for osteoporosis if you:  Have a family history of the condition.  Have poor nutrition.  Use steroid medicines, such as prednisone.  Are female.  Are age 66 or older.  Smoke or have a history of smoking.  Are not physically active (are sedentary).  Are white (Caucasian) or of Asian descent.  Have a small body frame.  Take certain medicines, such as antiseizure medicines. What are the signs or symptoms? A fracture might be the first sign of osteoporosis, especially if the fracture results from a fall or injury that usually would not cause a bone to break. Other signs and symptoms include:  Pain in the neck or low back.  Stooped posture.  Loss of height. How is this diagnosed? This condition may be diagnosed based on:  Your medical history.  A physical exam.  A bone mineral density test, also called a DXA or DEXA test (dual-energy X-ray absorptiometry test). This test uses X-rays to measure the amount of minerals in your bones. How is this treated? The goal of treatment is to strengthen your bones and lower your risk for a fracture. Treatment may involve:  Making lifestyle changes, such as: ? Including foods with more calcium and vitamin D in your diet. ? Doing weight-bearing and muscle-strengthening exercises. ? Stopping tobacco use. ? Limiting alcohol intake.  Taking medicine to slow the process of bone loss or to increase bone density.  Taking daily supplements of calcium  and vitamin D.  Taking hormone replacement medicines, such as estrogen for women and testosterone for men.  Monitoring your levels of calcium and vitamin D. Follow these instructions at home:  Activity  Exercise as told by your health care provider. Ask your health care provider what exercises and activities are safe for you. You should do: ? Exercises that make you work against gravity (weight-bearing exercises), such as tai chi, yoga, or walking. ? Exercises to strengthen muscles, such as lifting weights. Lifestyle  Limit alcohol intake to no more than 1 drink a day for nonpregnant women and 2 drinks a day for men. One drink equals 12 oz of beer, 5 oz of wine, or 1 oz of hard liquor.  Do not use any products that contain nicotine or tobacco, such as cigarettes and e-cigarettes. If you need help quitting, ask your health care provider. Preventing falls  Use devices to help you move around (mobility aids) as needed, such as canes, walkers, scooters, or crutches.  Keep rooms well-lit and clutter-free.  Remove tripping hazards from walkways, including cords and throw rugs.  Install grab bars in bathrooms and safety rails on stairs.  Use rubber mats in the bathroom and other areas that are often wet or slippery.  Wear closed-toe shoes that fit well and support your feet. Wear shoes that have rubber soles or low heels.  Review your medicines with your health care provider. Some medicines can cause dizziness or changes in blood pressure, which can increase your risk of falling. General instructions  Include calcium and vitamin D in your diet. Calcium is important for bone health, and vitamin D helps your body to absorb calcium. Good sources of calcium and vitamin D include: ? Certain fatty fish, such as salmon and tuna. ? Products that have calcium and vitamin D added to them (fortified products), such as fortified cereals. ? Egg yolks. ? Cheese. ? Liver.  Take over-the-counter  and prescription medicines only as told by your health care provider.  Keep all follow-up visits as told by your health care provider. This is important. Contact a health care provider if:  You have never been screened for osteoporosis and you are: ? A woman who is age 19 or older. ? A man who is age 18 or older. Get help right away if:  You fall or injure yourself. Summary  Osteoporosis is thinning and loss of density in your bones. This makes bones more brittle and fragile and more likely to break (fracture),even with minor falls.  The goal of treatment is to strengthen your bones and reduce your risk for a fracture.  Include calcium and vitamin D in your diet. Calcium is important for bone health, and vitamin D helps your body to absorb calcium.  Talk with your health care provider about screening for osteoporosis if you are a woman who is age 17 or older, or a man who is age 26 or older. This information is not intended to replace advice given to you by your health care provider. Make sure you discuss any questions you have with your health care provider. Document Revised: 03/12/2017 Document Reviewed: 01/22/2017 Elsevier Patient Education  2020 [employer].

## 2019-09-21 DIAGNOSIS — I10 Essential (primary) hypertension: Secondary | ICD-10-CM | POA: Diagnosis not present

## 2019-09-21 LAB — COMPLETE METABOLIC PANEL WITH GFR
AG Ratio: 1.5 (calc) (ref 1.0–2.5)
ALT: 16 U/L (ref 6–29)
AST: 24 U/L (ref 10–35)
Albumin: 4.3 g/dL (ref 3.6–5.1)
Alkaline phosphatase (APISO): 51 U/L (ref 37–153)
BUN/Creatinine Ratio: 33 (calc) — ABNORMAL HIGH (ref 6–22)
BUN: 27 mg/dL — ABNORMAL HIGH (ref 7–25)
CO2: 28 mmol/L (ref 20–32)
Calcium: 9.9 mg/dL (ref 8.6–10.4)
Chloride: 101 mmol/L (ref 98–110)
Creat: 0.81 mg/dL (ref 0.60–0.88)
GFR, Est African American: 79 mL/min/{1.73_m2} (ref >=60)
GFR, Est Non African American: 68 mL/min/{1.73_m2} (ref >=60)
Globulin: 2.9 g/dL (calc) (ref 1.9–3.7)
Glucose, Bld: 95 mg/dL (ref 65–99)
Potassium: 5 mmol/L (ref 3.5–5.3)
Sodium: 138 mmol/L (ref 135–146)
Total Bilirubin: 0.6 mg/dL (ref 0.2–1.2)
Total Protein: 7.2 g/dL (ref 6.1–8.1)

## 2019-09-21 LAB — CBC WITH DIFFERENTIAL/PLATELET
Absolute Monocytes: 734 cells/uL (ref 200–950)
Basophils Absolute: 72 cells/uL (ref 0–200)
Basophils Relative: 1 %
Eosinophils Absolute: 151 cells/uL (ref 15–500)
Eosinophils Relative: 2.1 %
HCT: 38.6 % (ref 35.0–45.0)
Hemoglobin: 13 g/dL (ref 11.7–15.5)
Lymphs Abs: 2189 cells/uL (ref 850–3900)
MCH: 31.5 pg (ref 27.0–33.0)
MCHC: 33.7 g/dL (ref 32.0–36.0)
MCV: 93.5 fL (ref 80.0–100.0)
MPV: 9.8 fL (ref 7.5–12.5)
Monocytes Relative: 10.2 %
Neutro Abs: 4054 cells/uL (ref 1500–7800)
Neutrophils Relative %: 56.3 %
Platelets: 197 10*3/uL (ref 140–400)
RBC: 4.13 10*6/uL (ref 3.80–5.10)
RDW: 11.8 % (ref 11.0–15.0)
Total Lymphocyte: 30.4 %
WBC: 7.2 10*3/uL (ref 3.8–10.8)

## 2019-09-21 LAB — LIPID PANEL
Cholesterol: 172 mg/dL (ref <200)
HDL: 57 mg/dL (ref >=50)
LDL Cholesterol (Calc): 95 mg/dL (calc)
Non-HDL Cholesterol (Calc): 115 mg/dL (calc) (ref <130)
Total CHOL/HDL Ratio: 3 (calc) (ref <5.0)
Triglycerides: 104 mg/dL (ref <150)

## 2019-09-21 LAB — VITAMIN D 25 HYDROXY (VIT D DEFICIENCY, FRACTURES): Vit D, 25-Hydroxy: 96 ng/mL (ref 30–100)

## 2019-09-21 LAB — MAGNESIUM: Magnesium: 2.3 mg/dL (ref 1.5–2.5)

## 2019-09-21 LAB — TSH: TSH: 0.88 mIU/L (ref 0.40–4.50)

## 2019-09-22 LAB — URINALYSIS, ROUTINE W REFLEX MICROSCOPIC
Bacteria, UA: NONE SEEN /HPF
Bilirubin Urine: NEGATIVE
Glucose, UA: NEGATIVE
Hgb urine dipstick: NEGATIVE
Hyaline Cast: NONE SEEN /LPF
Ketones, ur: NEGATIVE
Nitrite: NEGATIVE
Protein, ur: NEGATIVE
RBC / HPF: NONE SEEN /HPF (ref 0–2)
Specific Gravity, Urine: 1.016 (ref 1.001–1.03)
pH: 6.5 (ref 5.0–8.0)

## 2019-09-22 LAB — MICROALBUMIN / CREATININE URINE RATIO
Creatinine, Urine: 78 mg/dL (ref 20–275)
Microalb Creat Ratio: 3 mcg/mg creat (ref <30)
Microalb, Ur: 0.2 mg/dL

## 2019-09-28 ENCOUNTER — Ambulatory Visit: Payer: Medicare PPO

## 2019-10-23 DIAGNOSIS — E039 Hypothyroidism, unspecified: Secondary | ICD-10-CM

## 2019-10-23 MED ORDER — LIOTHYRONINE SODIUM 5 MCG PO TABS: 10.0000 ug | ORAL_TABLET | Freq: Two times a day (BID) | ORAL | 3 refills | Status: DC

## 2019-10-26 ENCOUNTER — Other Ambulatory Visit: Payer: Self-pay

## 2019-10-26 ENCOUNTER — Encounter (INDEPENDENT_AMBULATORY_CARE_PROVIDER_SITE_OTHER): Payer: Medicare PPO | Admitting: Ophthalmology

## 2019-10-26 DIAGNOSIS — I10 Essential (primary) hypertension: Secondary | ICD-10-CM

## 2019-10-26 DIAGNOSIS — H35033 Hypertensive retinopathy, bilateral: Secondary | ICD-10-CM | POA: Diagnosis not present

## 2019-10-26 DIAGNOSIS — H34832 Tributary (branch) retinal vein occlusion, left eye, with macular edema: Secondary | ICD-10-CM | POA: Diagnosis not present

## 2019-10-26 DIAGNOSIS — H43813 Vitreous degeneration, bilateral: Secondary | ICD-10-CM | POA: Diagnosis not present

## 2019-12-27 DIAGNOSIS — H3562 Retinal hemorrhage, left eye: Secondary | ICD-10-CM | POA: Diagnosis not present

## 2019-12-27 DIAGNOSIS — H35033 Hypertensive retinopathy, bilateral: Secondary | ICD-10-CM | POA: Diagnosis not present

## 2019-12-27 DIAGNOSIS — H348322 Tributary (branch) retinal vein occlusion, left eye, stable: Secondary | ICD-10-CM | POA: Diagnosis not present

## 2020-01-26 ENCOUNTER — Encounter (INDEPENDENT_AMBULATORY_CARE_PROVIDER_SITE_OTHER): Payer: Medicare PPO | Admitting: Ophthalmology

## 2020-01-26 ENCOUNTER — Other Ambulatory Visit: Payer: Self-pay

## 2020-01-26 DIAGNOSIS — I10 Essential (primary) hypertension: Secondary | ICD-10-CM

## 2020-01-26 DIAGNOSIS — H35033 Hypertensive retinopathy, bilateral: Secondary | ICD-10-CM

## 2020-01-26 DIAGNOSIS — H43813 Vitreous degeneration, bilateral: Secondary | ICD-10-CM | POA: Diagnosis not present

## 2020-01-26 DIAGNOSIS — H348322 Tributary (branch) retinal vein occlusion, left eye, stable: Secondary | ICD-10-CM

## 2020-03-20 DIAGNOSIS — Z85828 Personal history of other malignant neoplasm of skin: Secondary | ICD-10-CM | POA: Diagnosis not present

## 2020-03-20 DIAGNOSIS — L821 Other seborrheic keratosis: Secondary | ICD-10-CM | POA: Diagnosis not present

## 2020-03-20 DIAGNOSIS — I8393 Asymptomatic varicose veins of bilateral lower extremities: Secondary | ICD-10-CM | POA: Diagnosis not present

## 2020-03-20 DIAGNOSIS — L812 Freckles: Secondary | ICD-10-CM | POA: Diagnosis not present

## 2020-03-20 DIAGNOSIS — L814 Other melanin hyperpigmentation: Secondary | ICD-10-CM | POA: Diagnosis not present

## 2020-03-20 DIAGNOSIS — D1801 Hemangioma of skin and subcutaneous tissue: Secondary | ICD-10-CM | POA: Diagnosis not present

## 2020-03-20 DIAGNOSIS — D229 Melanocytic nevi, unspecified: Secondary | ICD-10-CM | POA: Diagnosis not present

## 2020-03-20 DIAGNOSIS — L819 Disorder of pigmentation, unspecified: Secondary | ICD-10-CM | POA: Diagnosis not present

## 2020-03-20 DIAGNOSIS — L905 Scar conditions and fibrosis of skin: Secondary | ICD-10-CM | POA: Diagnosis not present

## 2020-03-29 DIAGNOSIS — N182 Chronic kidney disease, stage 2 (mild): Secondary | ICD-10-CM | POA: Insufficient documentation

## 2020-03-29 NOTE — Progress Notes (Signed)
MEDICARE WELLNESS  Assessment:   Medicare Wellness 1 year  Other migraine without status migrainosus, not intractable Avoid triggers, controlled  Hypothyroidism, unspecified type Hypothyroidism-check TSH level, continue medications the same, reminded to take on an empty stomach 30-4mins before food.   Rosacea Continue follow up derm as needed  Hypertension - continue medications, DASH diet, exercise and monitor at home. Call if greater than 130/80.   Hyperlipidemia, unspecified hyperlipidemia type -continue medications, check lipids, decrease fatty foods, increase activity.   Vitamin D deficiency -     Continue supplement  CKD II Increase fluids, avoid NSAIDS, monitor sugars, will monitor  Medication management  Anemia, unspecified type monitor  Osteoporosis, unspecified osteoporosis type, unspecified pathological fracture presence Declines foxamax, continue vitamin D, DEXA scheduled   Future Appointments  Date Time Provider [place]  05/21/2020  2:30 PM [facility] MM 3 [facility]  05/21/2020  3:00 PM [facility] DX DEXA 1 [facility]BCGD[facility]  06/19/2020  1:00 PM Hayden Pedro, MD T[facility]RE None  09/19/2020  3:00 PM Garnet Sierras, NP [facility] None  04/01/2021  2:00 PM Liane Comber, NP [facility] None      Subjective:   Daisy Oliver is a 84 y.o. female who presents for medicare and 3 month follow up.   She use to follow with Dr. Sharol Roussel for alternative medicine but she has retired, labs done here.   She follows with Derm, Dr. Pearline Cables, had facial left basal cell carcinoma.  BMI is Body mass index is 25.23 kg/m., she has been working on diet and exercise, [facility]. She has reduced ETOH, quit drinking daily, occasionally only.  Wt Readings from Last 3 Encounters:  04/01/20 147 lb (66.7 kg)  09/20/19 161 lb (73 kg)  06/05/19 176 lb (79.8 kg)   Her blood pressure has been controlled at home, she is on benicar 20mg , today their  BP is BP: 110/72 She does workout, it has been harder with the pandemic but she is still trying to move.  She denies chest pain, shortness of breath, dizziness.   She is not on cholesterol medication and denies myalgias. Her cholesterol is at goal. The cholesterol last visit was:  Lab Results  Component Value Date   CHOL 172 09/20/2019   HDL 57 09/20/2019   LDLCALC 95 09/20/2019   TRIG 104 09/20/2019   CHOLHDL 3.0 09/20/2019    Has stable CKD II; last gfr:  Lab Results  Component Value Date   GFRNONAA 68 09/20/2019   Patient is on Vitamin D supplement.   Lab Results  Component Value Date   VD25OH 96 09/20/2019   She is on thyroid medication, cytomel 43mcg (64mcg BID). Her medication was not changed last visit.   Lab Results  Component Value Date   TSH 0.88 09/20/2019      Medication Review  Current Outpatient Medications (Endocrine & Metabolic):  .  liothyronine (CYTOMEL) 5 MCG tablet, Take 2 tablets (10 mcg total) by mouth 2 (two) times daily.  Current Outpatient Medications (Cardiovascular):  .  olmesartan (BENICAR) 40 MG tablet, Take 1 tablet (40 mg total) by mouth daily. (Patient taking differently: Take 40 mg by mouth daily. Take 1/2 tablet daily)  Current Outpatient Medications (Respiratory):  Marland Kitchen  DiphenhydrAMINE HCl (ALLERGY MED PO), Take by mouth. [facility] - generic allergy med   Current Outpatient Medications (Hematological):  Marland Kitchen  Cyanocobalamin (VITAMIN B 12 PO), Take by mouth daily.  Current Outpatient Medications (Other):  .  BIOTIN PO, Take by mouth daily. .  Calcium-Magnesium-Vitamin D (CALCIUM 1200+D3 PO), Take by mouth. .  clotrimazole-betamethasone (LOTRISONE) cream, Apply 1 application topically 2 (two) times daily. .  Digestive Enzymes (BETAINE HCL PO), Take by mouth. Ortho Biotic .  Ergocalciferol (VITAMIN D2) 2000 UNITS TABS, Take by mouth. .  erythromycin with ethanol (THERAMYCIN) 2 % external solution, Apply topically daily. Marland Kitchen   L-Methylfolate-Algae-B12-B6 (METANX PO), Take by mouth daily. .  Magnesium 500 MG CAPS, Take by mouth. .  meclizine (ANTIVERT) 25 MG tablet, 1/2-1 pill up to 3 times daily for motion sickness/dizziness .  Misc Natural Products (OSTEO BI-FLEX JOINT SHIELD PO), Take by mouth. Osteo Bi-Flex .  NEOMYCIN-POLYMYXIN-HYDROCORTISONE (CORTISPORIN) 1 % SOLN OTIC solution, Place 6 drops into both ears 4 (four) times daily. .  Omega-3 Fatty Acids (FISH OIL PO), Take by mouth daily. Fish Oil .  OVER THE COUNTER MEDICATION, daily. P 5 P50 .  OVER THE COUNTER MEDICATION,  .  Taurine 1000 MG CAPS, Take by mouth. .  tretinoin (RETIN-A) 0.05 % cream, Apply topically at bedtime. .  Zinc 50 MG CAPS, Take by mouth. Marland Kitchen  acyclovir (ZOVIRAX) 400 MG tablet, Take 1 pill 3 times daily with food and 2 pills at night  Current Problems (verified) Patient Active Problem List   Diagnosis Date Noted  . CKD (chronic kidney disease) stage 2, GFR 60-89 ml/min 03/29/2020  . History of basal cell cancer 03/17/2018  . Vitamin D deficiency 08/28/2014  . Medication management 08/28/2014  . Hypothyroidism 08/28/2014  . Hypertension 08/25/2013  . Hyperlipidemia 08/25/2013  . Migraines 08/25/2013  . Rosacea 08/25/2013   Screening Test and Preventative care:  Immunization History  Administered Date(s) Administered  . DT (Pediatric) 08/28/2014  . Influenza, High Dose Seasonal PF 04/11/2019  . PFIZER SARS-COV-2 Vaccination 05/10/2019, 05/31/2019  . Pneumococcal Conjugate-13 08/28/2015  . Pneumococcal Polysaccharide-23 03/08/2017   Last colonoscopy: 11/2013 will be last one Last mammogram: 09/2018 - scheduled Feb 2022 Last pap smear/pelvic exam: 2010 defer DEXA: 2017 + osteoporosis t -2.6- - scheduled 05/2020, would decline bisphosphonates, but ok with monitoring  Prior vaccinations: TD or Tdap: 2016  Influenza: 2020, out in office, will [facility]  Pneumococcal: 2018 Prevnar 13: 2017 Shingles/Zostavax: will check  with insurance  Covid 19: 2/2, 2021, pfizer - declining booster, had SE,   Names of Other Physician/Practitioners you currently use: 1. [facility]- here for primary care Following with Dr. Zigmund Daniel for retinal hemorrhage x Feb 27th 2. Dr. Herbert Deaner, eye doctor, last visit - s/p bilateral cataract surgery, had 01/2020 3.  Dr. Jackalyn Lombard dentist, last visit 2021  Patient Care Team: [name], MD as PCP - General (Internal Medicine) Izora Gala, MD as Consulting Physician (Otolaryngology) Laurence Spates, MD (Inactive) as Consulting Physician (Gastroenterology) Michel Santee, MD as Consulting Physician (Neurology) Lollie Sails, MD as Referring Physician (Internal Medicine)   Allergies Allergies  Allergen Reactions  . Barbiturates   . Pentothal [Thiopental]   . Sulfa Antibiotics   . Wheat Bran Nausea And Vomiting    SURGICAL HISTORY She  has a past surgical history that includes Radial head arthroplasty (Right, 09/16/2012) and Cataract extraction, bilateral (2020). FAMILY HISTORY Her family history includes Brain cancer (age of onset: 37) in her father; Cancer in her paternal grandfather; Heart disease in her mother; Hypertension in her sister and sister; Osteoporosis in her sister and sister; Suicidality in her paternal grandfather. SOCIAL HISTORY She  reports that she quit smoking  about 36 years ago. Her smoking use included cigarettes. She has a 44.00 pack-year smoking history. She has never used smokeless tobacco. She reports current alcohol use. She reports that she does not use drugs.  MEDICARE WELLNESS OBJECTIVES: Physical activity: Current Exercise Habits: Home exercise routine, Type of exercise: walking;Other - see comments;strength training/weights (zumba), Time (Minutes): 45, Frequency (Times/Week): 4, Weekly Exercise (Minutes/Week): 180, Intensity: Mild, Exercise limited by: None identified Cardiac risk factors: Cardiac Risk  Factors include: advanced age (>48men, >16 women);hypertension Depression/mood screen:   Depression screen [facility] 2/9 04/01/2020  Decreased Interest 0  Down, Depressed, Hopeless 1  PHQ - 2 Score 1    ADLs:  In your present state of health, do you have any difficulty performing the following activities: 04/01/2020  Hearing? N  Vision? N  Difficulty concentrating or making decisions? N  Walking or climbing stairs? N  Dressing or bathing? N  Doing errands, shopping? N  Some recent data might be hidden     Cognitive Testing  Alert? Yes  Normal Appearance?Yes  Oriented to person? Yes  Place? Yes   Time? Yes  Recall of three objects?  Yes  Can perform simple calculations? Yes  Displays appropriate judgment?Yes  Can read the correct time from a watch face?Yes  EOL planning: Does Patient Have a Medical Advance Directive?: Yes Type of Advance Directive: [place] will Does patient want to make changes to medical advance directive?: No - Patient declined Copy of [place] in Chart?: No - copy requested   Objective:     Blood pressure 110/72, pulse 77, temperature 97.9 F (36.6 C), weight 147 lb (66.7 kg), SpO2 96 %. Body mass index is 25.23 kg/m.  General appearance: alert, no distress, WD/WN,  female HEENT: normocephalic, sclerae anicteric, TMs pearly, nares patent, no discharge or erythema, pharynx normal Oral cavity: MMM, no lesions Neck: supple, no lymphadenopathy, no thyromegaly, no masses Heart: RRR, normal S1, S2, no murmurs Lungs: CTA bilaterally, no wheezes, rhonchi, or rales Abdomen: +bs, soft, non tender, non distended, no masses, no hepatomegaly, no splenomegaly Musculoskeletal: nontender, no swelling, no obvious deformity Extremities: no edema, no cyanosis, no clubbing Pulses: 2+ symmetric, upper and lower extremities, normal cap refill Neurological: alert, oriented x 3, CN2-12 intact, strength normal upper extremities and  lower extremities, DTRs 2+ bilateral arms and legs, no cerebellar signs, gait normal Skin: sparse eyebrows bilaterally Psychiatric: normal affect, behavior normal, pleasant  Breast: declines Gyn: declines Rectal: declines   Medicare Attestation I have personally reviewed: The patient's medical and social history Their use of alcohol, tobacco or illicit drugs Their current medications and supplements The patient's functional ability including ADLs,fall risks, home safety risks, cognitive, and hearing and visual impairment Diet and physical activities Evidence for depression or mood disorders  The patient's weight, height, BMI, and visual acuity have been recorded in the chart.  I have made referrals, counseling, and provided education to the patient based on review of the above and I have provided the patient with a written personalized care plan for preventive services.     Izora Ribas, NP   04/01/2020

## 2020-04-01 ENCOUNTER — Other Ambulatory Visit: Payer: Self-pay

## 2020-04-01 ENCOUNTER — Ambulatory Visit: Payer: Medicare Other | Admitting: Adult Health

## 2020-04-01 ENCOUNTER — Encounter: Payer: Self-pay | Admitting: Adult Health

## 2020-04-01 VITALS — BP 110/72 | HR 77 | Temp 97.9°F | Wt 147.0 lb

## 2020-04-01 DIAGNOSIS — L719 Rosacea, unspecified: Secondary | ICD-10-CM

## 2020-04-01 DIAGNOSIS — E785 Hyperlipidemia, unspecified: Secondary | ICD-10-CM

## 2020-04-01 DIAGNOSIS — R6889 Other general symptoms and signs: Secondary | ICD-10-CM

## 2020-04-01 DIAGNOSIS — Z85828 Personal history of other malignant neoplasm of skin: Secondary | ICD-10-CM | POA: Diagnosis not present

## 2020-04-01 DIAGNOSIS — E559 Vitamin D deficiency, unspecified: Secondary | ICD-10-CM

## 2020-04-01 DIAGNOSIS — E039 Hypothyroidism, unspecified: Secondary | ICD-10-CM

## 2020-04-01 DIAGNOSIS — N182 Chronic kidney disease, stage 2 (mild): Secondary | ICD-10-CM

## 2020-04-01 DIAGNOSIS — Z79899 Other long term (current) drug therapy: Secondary | ICD-10-CM

## 2020-04-01 DIAGNOSIS — Z0000 Encounter for general adult medical examination without abnormal findings: Secondary | ICD-10-CM

## 2020-04-01 DIAGNOSIS — I10 Essential (primary) hypertension: Secondary | ICD-10-CM | POA: Diagnosis not present

## 2020-04-01 DIAGNOSIS — Z6825 Body mass index (BMI) 25.0-25.9, adult: Secondary | ICD-10-CM

## 2020-04-01 DIAGNOSIS — Z0001 Encounter for general adult medical examination with abnormal findings: Secondary | ICD-10-CM | POA: Diagnosis not present

## 2020-04-01 MED ORDER — OLMESARTAN MEDOXOMIL 20 MG PO TABS: 20.0000 mg | ORAL_TABLET | Freq: Every day | ORAL | 1 refills | Status: DC

## 2020-04-01 NOTE — Patient Instructions (Signed)
Daisy Oliver , Thank you for taking time to come for your Medicare Wellness Visit. I appreciate your ongoing commitment to your health goals. Please review the following plan we discussed and let me know if I can assist you in the future.   These are the goals we discussed: Goals   None     This is a list of the screening recommended for you and due dates:  Health Maintenance  Topic Date Due  . COVID-19 Vaccine (3 - Pfizer risk 4-dose series) 06/28/2019  . Tetanus Vaccine  08/27/2024  . DEXA scan (bone density measurement)  Completed  . Pneumonia vaccines  Completed  . Flu Shot  Discontinued    Ask insurance about shingrix - 2 series shingles vaccine - [facility]   Zoster Vaccine, Recombinant injection What is this medicine? ZOSTER VACCINE (ZOS ter vak SEEN) is used to prevent shingles in adults 82 years old and over. This vaccine is not used to treat shingles or nerve pain from shingles. This medicine may be used for other purposes; ask your health care provider or pharmacist if you have questions. COMMON BRAND NAME(S): [facility] What should I tell my health care provider before I take this medicine? They need to know if you have any of these conditions:  blood disorders or disease  cancer like leukemia or lymphoma  immune system problems or therapy  an unusual or allergic reaction to vaccines, other medications, foods, dyes, or preservatives  pregnant or trying to get pregnant  breast-feeding How should I use this medicine? This vaccine is for injection in a muscle. It is given by a health care professional. Talk to your pediatrician regarding the use of this medicine in children. This medicine is not approved for use in children. Overdosage: If you think you have taken too much of this medicine contact a poison control center or emergency room at once. NOTE: This medicine is only for you. Do not share this medicine with others. What if I miss a dose? Keep  appointments for follow-up (booster) doses as directed. It is important not to miss your dose. Call your doctor or health care professional if you are unable to keep an appointment. What may interact with this medicine?  medicines that suppress your immune system  medicines to treat cancer  steroid medicines like prednisone or cortisone This list may not describe all possible interactions. Give your health care provider a list of all the medicines, herbs, non-prescription drugs, or dietary supplements you use. Also tell them if you smoke, drink alcohol, or use illegal drugs. Some items may interact with your medicine. What should I watch for while using this medicine? Visit your doctor for regular check ups. This vaccine, like all vaccines, may not fully protect everyone. What side effects may I notice from receiving this medicine? Side effects that you should report to your doctor or health care professional as soon as possible:  allergic reactions like skin rash, itching or hives, swelling of the face, lips, or tongue  breathing problems Side effects that usually do not require medical attention (report these to your doctor or health care professional if they continue or are bothersome):  chills  headache  fever  nausea, vomiting  redness, warmth, pain, swelling or itching at site where injected  tiredness This list may not describe all possible side effects. Call your doctor for medical advice about side effects. You may report side effects to FDA at 1-800-FDA-1088. Where should I keep  my medicine? This vaccine is only given in a clinic, pharmacy, doctor's office, or other health care setting and will not be stored at home. NOTE: This sheet is a summary. It may not cover all possible information. If you have questions about this medicine, talk to your doctor, pharmacist, or health care provider.  2020 Elsevier/Gold Standard (2016-11-09 13:20:30)

## 2020-04-02 LAB — CBC WITH DIFFERENTIAL/PLATELET
Absolute Monocytes: 489 cells/uL (ref 200–950)
Basophils Absolute: 60 cells/uL (ref 0–200)
Basophils Relative: 0.9 %
Eosinophils Absolute: 101 cells/uL (ref 15–500)
Eosinophils Relative: 1.5 %
HCT: 40.3 % (ref 35.0–45.0)
Hemoglobin: 13.7 g/dL (ref 11.7–15.5)
Lymphs Abs: 1990 cells/uL (ref 850–3900)
MCH: 31 pg (ref 27.0–33.0)
MCHC: 34 g/dL (ref 32.0–36.0)
MCV: 91.2 fL (ref 80.0–100.0)
MPV: 9.9 fL (ref 7.5–12.5)
Monocytes Relative: 7.3 %
Neutro Abs: 4060 cells/uL (ref 1500–7800)
Neutrophils Relative %: 60.6 %
Platelets: 214 10*3/uL (ref 140–400)
RBC: 4.42 10*6/uL (ref 3.80–5.10)
RDW: 12.3 % (ref 11.0–15.0)
Total Lymphocyte: 29.7 %
WBC: 6.7 10*3/uL (ref 3.8–10.8)

## 2020-04-02 LAB — TSH: TSH: 1.02 mIU/L (ref 0.40–4.50)

## 2020-04-02 LAB — COMPLETE METABOLIC PANEL WITH GFR
AG Ratio: 1.3 (calc) (ref 1.0–2.5)
ALT: 16 U/L (ref 6–29)
AST: 40 U/L — ABNORMAL HIGH (ref 10–35)
Albumin: 4.4 g/dL (ref 3.6–5.1)
Alkaline phosphatase (APISO): 62 U/L (ref 37–153)
BUN/Creatinine Ratio: 32 (calc) — ABNORMAL HIGH (ref 6–22)
BUN: 26 mg/dL — ABNORMAL HIGH (ref 7–25)
CO2: 23 mmol/L (ref 20–32)
Calcium: 10 mg/dL (ref 8.6–10.4)
Chloride: 98 mmol/L (ref 98–110)
Creat: 0.82 mg/dL (ref 0.60–0.88)
GFR, Est African American: 77 mL/min/{1.73_m2} (ref >=60)
GFR, Est Non African American: 67 mL/min/{1.73_m2} (ref >=60)
Globulin: 3.3 g/dL (calc) (ref 1.9–3.7)
Glucose, Bld: 109 mg/dL — ABNORMAL HIGH (ref 65–99)
Potassium: 4.5 mmol/L (ref 3.5–5.3)
Sodium: 140 mmol/L (ref 135–146)
Total Bilirubin: 0.7 mg/dL (ref 0.2–1.2)
Total Protein: 7.7 g/dL (ref 6.1–8.1)

## 2020-04-02 LAB — MAGNESIUM: Magnesium: 2.4 mg/dL (ref 1.5–2.5)

## 2020-04-02 LAB — SPECIMEN COMPROMISED

## 2020-04-02 LAB — LIPID PANEL
Cholesterol: 195 mg/dL (ref <200)
HDL: 71 mg/dL (ref >=50)
LDL Cholesterol (Calc): 104 mg/dL (calc) — ABNORMAL HIGH
Non-HDL Cholesterol (Calc): 124 mg/dL (calc) (ref <130)
Total CHOL/HDL Ratio: 2.7 (calc) (ref <5.0)
Triglycerides: 104 mg/dL (ref <150)

## 2020-05-20 ENCOUNTER — Other Ambulatory Visit: Payer: Self-pay | Admitting: Physician Assistant

## 2020-05-20 DIAGNOSIS — E2839 Other primary ovarian failure: Secondary | ICD-10-CM

## 2020-05-21 ENCOUNTER — Ambulatory Visit: Payer: Medicare PPO

## 2020-05-21 ENCOUNTER — Other Ambulatory Visit: Payer: Medicare PPO

## 2020-06-19 ENCOUNTER — Encounter (INDEPENDENT_AMBULATORY_CARE_PROVIDER_SITE_OTHER): Payer: Medicare PPO | Admitting: Ophthalmology

## 2020-06-19 ENCOUNTER — Other Ambulatory Visit: Payer: Self-pay

## 2020-06-19 DIAGNOSIS — H348322 Tributary (branch) retinal vein occlusion, left eye, stable: Secondary | ICD-10-CM | POA: Diagnosis not present

## 2020-06-19 DIAGNOSIS — H35033 Hypertensive retinopathy, bilateral: Secondary | ICD-10-CM

## 2020-06-19 DIAGNOSIS — H43813 Vitreous degeneration, bilateral: Secondary | ICD-10-CM | POA: Diagnosis not present

## 2020-06-19 DIAGNOSIS — I10 Essential (primary) hypertension: Secondary | ICD-10-CM | POA: Diagnosis not present

## 2020-08-26 ENCOUNTER — Telehealth: Payer: Self-pay | Admitting: Adult Health

## 2020-08-26 DIAGNOSIS — U071 COVID-19: Secondary | ICD-10-CM

## 2020-08-26 HISTORY — DX: COVID-19: U07.1

## 2020-08-26 NOTE — Telephone Encounter (Signed)
Spoke with patient on phone; + covid 19 test at home, she was vaccinated; reports sx have essentially resolved other than mild fatigue, currently ? Day 4. No CP o dyspnea. Discussed supportive therapy, declined referral for antivirals. Follow up if any worsening or new concerns.

## 2020-09-19 ENCOUNTER — Encounter: Payer: Self-pay | Admitting: Adult Health

## 2020-09-19 ENCOUNTER — Other Ambulatory Visit: Payer: Self-pay

## 2020-09-19 ENCOUNTER — Ambulatory Visit (INDEPENDENT_AMBULATORY_CARE_PROVIDER_SITE_OTHER): Payer: Medicare PPO | Admitting: Adult Health

## 2020-09-19 VITALS — BP 104/62 | HR 75 | Temp 97.5°F | Ht 64.0 in | Wt 144.4 lb

## 2020-09-19 DIAGNOSIS — Z131 Encounter for screening for diabetes mellitus: Secondary | ICD-10-CM | POA: Diagnosis not present

## 2020-09-19 DIAGNOSIS — Z87891 Personal history of nicotine dependence: Secondary | ICD-10-CM | POA: Diagnosis not present

## 2020-09-19 DIAGNOSIS — E785 Hyperlipidemia, unspecified: Secondary | ICD-10-CM

## 2020-09-19 DIAGNOSIS — Z85828 Personal history of other malignant neoplasm of skin: Secondary | ICD-10-CM

## 2020-09-19 DIAGNOSIS — Z136 Encounter for screening for cardiovascular disorders: Secondary | ICD-10-CM

## 2020-09-19 DIAGNOSIS — G43809 Other migraine, not intractable, without status migrainosus: Secondary | ICD-10-CM | POA: Diagnosis not present

## 2020-09-19 DIAGNOSIS — N182 Chronic kidney disease, stage 2 (mild): Secondary | ICD-10-CM

## 2020-09-19 DIAGNOSIS — Z1389 Encounter for screening for other disorder: Secondary | ICD-10-CM

## 2020-09-19 DIAGNOSIS — I10 Essential (primary) hypertension: Secondary | ICD-10-CM

## 2020-09-19 DIAGNOSIS — E039 Hypothyroidism, unspecified: Secondary | ICD-10-CM | POA: Diagnosis not present

## 2020-09-19 DIAGNOSIS — L719 Rosacea, unspecified: Secondary | ICD-10-CM

## 2020-09-19 DIAGNOSIS — Z79899 Other long term (current) drug therapy: Secondary | ICD-10-CM

## 2020-09-19 DIAGNOSIS — Z0000 Encounter for general adult medical examination without abnormal findings: Secondary | ICD-10-CM

## 2020-09-19 DIAGNOSIS — E559 Vitamin D deficiency, unspecified: Secondary | ICD-10-CM

## 2020-09-19 NOTE — Patient Instructions (Signed)
  Ms. Hammitt , Thank you for taking time to come for your Annual Wellness Visit. I appreciate your ongoing commitment to your health goals. Please review the following plan we discussed and let me know if I can assist you in the future.    This is a list of the screening recommended for you and due dates:  Health Maintenance  Topic Date Due   Zoster (Shingles) Vaccine (1 of 2) Never done   Tetanus Vaccine  08/27/2024   DEXA scan (bone density measurement)  Completed   Pneumonia vaccines  Completed   HPV Vaccine  Aged Out   Pneumococcal Vaccination  Discontinued   Flu Shot  Discontinued   COVID-19 Vaccine  Discontinued     Know what a healthy weight is for you (roughly BMI <25) and aim to maintain this  Aim for 7+ servings of fruits and vegetables daily  65-80+ fluid ounces of water or unsweet tea for healthy kidneys  Limit to max 1 drink of alcohol per day; avoid smoking/tobacco  Limit animal fats in diet for cholesterol and heart health - choose grass fed whenever available  Avoid highly processed foods, and foods high in saturated/trans fats  Aim for low stress - take time to unwind and care for your mental health  Aim for 150 min of moderate intensity exercise weekly for heart health, and weights twice weekly for bone health  Aim for 7-9 hours of sleep daily    A great goal to work towards is aiming to get in a serving daily of some of the most nutritionally dense foods - G- BOMBS daily

## 2020-09-19 NOTE — Progress Notes (Signed)
CPE  Assessment:   Encounter for Annual Physical Exam with abnormal findings Due annually  Check with insurance about shingrix  Other migraine without status migrainosus, not intractable Avoid triggers, controlled  Hypothyroidism, unspecified type Hypothyroidism-check TSH level, continue medications the same, reminded to take on an empty stomach 30-52mins before food.   Rosacea Continue follow up derm as needed  Hypertension - continue medications, DASH diet, exercise and monitor at home. Call if greater than 130/80.   Hyperlipidemia, unspecified hyperlipidemia type -continue medications, check lipids, decrease fatty foods, increase activity.   Vitamin D deficiency -     Continue supplement  CKD II Increase fluids, avoid NSAIDS, monitor sugars, will monitor  History of BCC Derm follows annually   Osteoporosis, unspecified osteoporosis type, unspecified pathological fracture presence Declines foxamax, continue vitamin D, DEXA scheduled    Orders Placed This Encounter  Procedures   DG Chest 2 View   CBC with Differential/Platelet   COMPLETE METABOLIC PANEL WITH GFR   Magnesium   Lipid panel   TSH   Hemoglobin A1c   VITAMIN D 25 Hydroxy (Vit-D Deficiency, Fractures)   Microalbumin / creatinine urine ratio   Urinalysis, Routine w reflex microscopic   EKG 12-Lead     Future Appointments  Date Time Provider [place]  10/11/2020  3:00 PM [facility] MM 2 [facility]  10/11/2020  3:30 PM [facility] DX DEXA 1 [facility]  11/20/2020  2:00 PM Hayden Pedro, MD [facility] None  04/01/2021  2:00 PM Liane Comber, NP [facility] None  09/22/2021  3:00 PM Liane Comber, NP [facility] None      Subjective:   Daisy Oliver is a 85 y.o. female who presents for CPE. She has Hypertension; Hyperlipidemia; Migraines; Rosacea; Vitamin D deficiency; Medication management; Hypothyroidism; History of basal cell cancer; and CKD (chronic kidney disease)  stage 2, GFR 60-89 ml/min on their problem list.  She is married, 2 children, no grandchildren. She is a retired Pharmacist, hospital, various ages.  Husband [name] has memory issues, in denial, doesn't like leaving the house. This is somewhat stressful but managing. Enjoys traveling with friends.   She has covid 19 1 month ago, has recovered and denies residual concerns.   She follows with Derm Dr. Pearline Cables annually for history of left cheek basal cell carcinoma.  BMI is Body mass index is 24.79 kg/m., she has been working on diet and exercise, [facility]. She has reduced ETOH, quit drinking daily, occasionally only.  Wt Readings from Last 3 Encounters:  09/19/20 144 lb 6.4 oz (65.5 kg)  04/01/20 147 lb (66.7 kg)  09/20/19 161 lb (73 kg)   Her blood pressure has been controlled at home, she is on benicar 20mg , today their BP is BP: 104/62 She does workout  She denies chest pain, shortness of breath, dizziness.   She is not on cholesterol medication and denies myalgias. Her cholesterol is not at goal. The cholesterol last visit was:  Lab Results  Component Value Date   CHOL 195 04/01/2020   HDL 71 04/01/2020   LDLCALC 104 (H) 04/01/2020   TRIG 104 04/01/2020   CHOLHDL 2.7 04/01/2020    Has stable CKD II; last gfr:  Lab Results  Component Value Date   GFRNONAA 67 04/01/2020   Patient is on Vitamin D supplement.   Lab Results  Component Value Date   VD25OH 96 09/20/2019   She is on thyroid medication, cytomel 24mcg (56mcg BID). Her medication  was not changed last visit.   Lab Results  Component Value Date   TSH 1.02 04/01/2020       Medication Review  Current Outpatient Medications (Endocrine & Metabolic):    liothyronine (CYTOMEL) 5 MCG tablet, Take 2 tablets (10 mcg total) by mouth 2 (two) times daily.  Current Outpatient Medications (Cardiovascular):    olmesartan (BENICAR) 20 MG tablet, Take 1 tablet (20 mg total) by mouth daily.  Current Outpatient Medications  (Respiratory):    DiphenhydrAMINE HCl (ALLERGY MED PO), Take by mouth. [facility] - generic allergy med   Current Outpatient Medications (Hematological):    Cyanocobalamin (VITAMIN B 12 PO), Take by mouth daily.  Current Outpatient Medications (Other):    BIOTIN PO, Take by mouth daily.   Calcium-Magnesium-Vitamin D (CALCIUM 1200+D3 PO), Take by mouth.   clotrimazole-betamethasone (LOTRISONE) cream, Apply 1 application topically 2 (two) times daily.   Digestive Enzymes (BETAINE HCL PO), Take by mouth. Ortho Biotic   Ergocalciferol (VITAMIN D2) 2000 UNITS TABS, Take by mouth.   erythromycin with ethanol (THERAMYCIN) 2 % external solution, Apply topically daily.   L-Methylfolate-Algae-B12-B6 (METANX PO), Take by mouth daily.   Magnesium 500 MG CAPS, Take by mouth.   meclizine (ANTIVERT) 25 MG tablet, 1/2-1 pill up to 3 times daily for motion sickness/dizziness   Misc Natural Products (OSTEO BI-FLEX JOINT SHIELD PO), Take by mouth. Osteo Bi-Flex   NEOMYCIN-POLYMYXIN-HYDROCORTISONE (CORTISPORIN) 1 % SOLN OTIC solution, Place 6 drops into both ears 4 (four) times daily.   Omega-3 Fatty Acids (FISH OIL PO), Take by mouth daily. Fish Oil   OVER THE COUNTER MEDICATION, daily. P 5 P50   OVER THE COUNTER MEDICATION,    Taurine 1000 MG CAPS, Take by mouth.   tretinoin (RETIN-A) 0.05 % cream, Apply topically at bedtime.   Zinc 50 MG CAPS, Take by mouth.  Current Problems (verified) Patient Active Problem List   Diagnosis Date Noted   CKD (chronic kidney disease) stage 2, GFR 60-89 ml/min 03/29/2020   History of basal cell cancer 03/17/2018   Vitamin D deficiency 08/28/2014   Medication management 08/28/2014   Hypothyroidism 08/28/2014   Hypertension 08/25/2013   Hyperlipidemia 08/25/2013   Migraines 08/25/2013   Rosacea 08/25/2013   Screening Test and Preventative care:  Immunization History  Administered Date(s) Administered   DT (Pediatric) 08/28/2014   Influenza, High Dose Seasonal PF  04/11/2019   PFIZER(Purple Top)SARS-COV-2 Vaccination 05/10/2019, 05/31/2019   Pneumococcal Conjugate-13 08/28/2015   Pneumococcal Polysaccharide-23 03/08/2017    Last colonoscopy: 11/2013 DONE Last mammogram: 09/2018 - has scheduled 10/2020 Last pap smear/pelvic exam: 2010 defer DEXA: 2017 + osteoporosis t -2.6- - scheduled 10/2020, would decline bisphosphonates, but ok with monitoring  Prior vaccinations: TD or Tdap: 2016  Influenza: 2021 Pneumococcal: 2018 Prevnar 13: 2017 Shingles/Zostavax: will check with insurance  Covid 19: 2/2, 2021, pfizer - declining booster, had SE,   Names of Other Physician/Practitioners you currently use: 1.  [facility]- here for primary care Following with Dr. Zigmund Daniel for retinal hemorrhage x Feb 27th, will have 6 month follow up  2. Dr. Herbert Deaner, eye doctor, last visit - s/p bilateral cataract surgery, had 01/2020 3.  Dr. Jackalyn Lombard dentist, last visit 2022  Patient Care Team: [name], MD as PCP - General (Internal Medicine) Izora Gala, MD as Consulting Physician (Otolaryngology) Laurence Spates, MD (Inactive) as Consulting Physician (Gastroenterology) Michel Santee, MD as Consulting Physician (Neurology) Lollie Sails, MD as Referring Physician (Internal Medicine)   Allergies Allergies  Allergen Reactions   Barbiturates    Pentothal [Thiopental]    Sulfa Antibiotics    Wheat Bran Nausea And Vomiting    SURGICAL HISTORY She  has a past surgical history that includes Radial head arthroplasty (Right, 09/16/2012) and Cataract extraction, bilateral (2020). FAMILY HISTORY Her family history includes Brain cancer (age of onset: 80) in her father; Cancer in her paternal grandfather; Heart disease in her mother; Hypertension in her sister and sister; Osteoporosis in her sister and sister; Suicidality in her paternal grandfather. SOCIAL HISTORY She  reports that she quit smoking about 37 years  ago. Her smoking use included cigarettes. She has a 44.00 pack-year smoking history. She has never used smokeless tobacco. She reports previous alcohol use. She reports that she does not use drugs.   Review of Systems  Constitutional:  Negative for malaise/fatigue and weight loss.  HENT:  Negative for hearing loss and tinnitus.   Eyes:  Negative for blurred vision and double vision.  Respiratory:  Negative for cough, sputum production, shortness of breath and wheezing.   Cardiovascular:  Negative for chest pain, palpitations, orthopnea, claudication, leg swelling and PND.  Gastrointestinal:  Negative for abdominal pain, blood in stool, constipation, diarrhea, heartburn, melena, nausea and vomiting.  Genitourinary: Negative.   Musculoskeletal:  Negative for falls, joint pain and myalgias.  Skin:  Negative for rash.  Neurological:  Negative for dizziness, tingling, sensory change, weakness and headaches.  Endo/Heme/Allergies:  Negative for polydipsia.  Psychiatric/Behavioral: Negative.  Negative for depression, memory loss, substance abuse and suicidal ideas. The patient is not nervous/anxious and does not have insomnia.   All other systems reviewed and are negative.   Objective:     Blood pressure 104/62, pulse 75, temperature (!) 97.5 F (36.4 C), height 5\' 4"  (1.626 m), weight 144 lb 6.4 oz (65.5 kg), SpO2 96 %. Body mass index is 24.79 kg/m.  General appearance: alert, no distress, WD/WN,  female HEENT: normocephalic, sclerae anicteric, TMs pearly, nares patent, no discharge or erythema, pharynx normal Oral cavity: MMM, no lesions Neck: supple, no lymphadenopathy, no thyromegaly, no masses Heart: RRR, normal S1, S2, no murmurs Lungs: CTA bilaterally, no wheezes, rhonchi, or rales Abdomen: +bs, soft, non tender, non distended, no masses, no hepatomegaly, no splenomegaly Musculoskeletal: nontender, no swelling, no obvious deformity Extremities: no edema, no cyanosis, no  clubbing Pulses: 2+ symmetric, upper and lower extremities, normal cap refill Neurological: alert, oriented x 3, CN2-12 intact, strength normal upper extremities and lower extremities, DTRs 2+ bilateral arms and legs, no cerebellar signs, gait normal Skin: sparse eyebrows bilaterally Psychiatric: normal affect, behavior normal, pleasant  Breast: declines  Gyn: declines Rectal: declines  The patient's weight, height, BMI, and visual acuity have been recorded in the chart.  I have made referrals, counseling, and provided education to the patient based on review of the above and I have provided the patient with a written personalized care plan for preventive services.     Izora Ribas, NP   09/19/2020

## 2020-09-20 ENCOUNTER — Other Ambulatory Visit: Payer: Self-pay | Admitting: Internal Medicine

## 2020-09-20 ENCOUNTER — Other Ambulatory Visit: Payer: Self-pay | Admitting: Adult Health

## 2020-09-20 DIAGNOSIS — E559 Vitamin D deficiency, unspecified: Secondary | ICD-10-CM | POA: Diagnosis not present

## 2020-09-20 DIAGNOSIS — Z79899 Other long term (current) drug therapy: Secondary | ICD-10-CM | POA: Diagnosis not present

## 2020-09-20 DIAGNOSIS — G43809 Other migraine, not intractable, without status migrainosus: Secondary | ICD-10-CM | POA: Diagnosis not present

## 2020-09-20 DIAGNOSIS — Z0000 Encounter for general adult medical examination without abnormal findings: Secondary | ICD-10-CM | POA: Diagnosis not present

## 2020-09-20 DIAGNOSIS — I10 Essential (primary) hypertension: Secondary | ICD-10-CM | POA: Diagnosis not present

## 2020-09-20 DIAGNOSIS — Z131 Encounter for screening for diabetes mellitus: Secondary | ICD-10-CM | POA: Diagnosis not present

## 2020-09-20 DIAGNOSIS — N182 Chronic kidney disease, stage 2 (mild): Secondary | ICD-10-CM | POA: Diagnosis not present

## 2020-09-20 DIAGNOSIS — E039 Hypothyroidism, unspecified: Secondary | ICD-10-CM | POA: Diagnosis not present

## 2020-09-20 DIAGNOSIS — E785 Hyperlipidemia, unspecified: Secondary | ICD-10-CM | POA: Diagnosis not present

## 2020-09-20 LAB — CBC WITH DIFFERENTIAL/PLATELET
Absolute Monocytes: 683 cells/uL (ref 200–950)
Basophils Absolute: 72 cells/uL (ref 0–200)
Basophils Relative: 1.1 %
Eosinophils Absolute: 182 cells/uL (ref 15–500)
Eosinophils Relative: 2.8 %
HCT: 39.5 % (ref 35.0–45.0)
Hemoglobin: 13.1 g/dL (ref 11.7–15.5)
Lymphs Abs: 2035 cells/uL (ref 850–3900)
MCH: 30.7 pg (ref 27.0–33.0)
MCHC: 33.2 g/dL (ref 32.0–36.0)
MCV: 92.5 fL (ref 80.0–100.0)
MPV: 10.5 fL (ref 7.5–12.5)
Monocytes Relative: 10.5 %
Neutro Abs: 3530 cells/uL (ref 1500–7800)
Neutrophils Relative %: 54.3 %
Platelets: 194 10*3/uL (ref 140–400)
RBC: 4.27 10*6/uL (ref 3.80–5.10)
RDW: 12.5 % (ref 11.0–15.0)
Total Lymphocyte: 31.3 %
WBC: 6.5 10*3/uL (ref 3.8–10.8)

## 2020-09-20 LAB — COMPLETE METABOLIC PANEL WITH GFR
AG Ratio: 1.3 (calc) (ref 1.0–2.5)
ALT: 16 U/L (ref 6–29)
AST: 22 U/L (ref 10–35)
Albumin: 4.3 g/dL (ref 3.6–5.1)
Alkaline phosphatase (APISO): 67 U/L (ref 37–153)
BUN/Creatinine Ratio: 35 (calc) — ABNORMAL HIGH (ref 6–22)
BUN: 29 mg/dL — ABNORMAL HIGH (ref 7–25)
CO2: 33 mmol/L — ABNORMAL HIGH (ref 20–32)
Calcium: 10.1 mg/dL (ref 8.6–10.4)
Chloride: 101 mmol/L (ref 98–110)
Creat: 0.83 mg/dL (ref 0.60–0.88)
GFR, Est African American: 76 mL/min/{1.73_m2} (ref >=60)
GFR, Est Non African American: 66 mL/min/{1.73_m2} (ref >=60)
Globulin: 3.2 g/dL (calc) (ref 1.9–3.7)
Glucose, Bld: 106 mg/dL — ABNORMAL HIGH (ref 65–99)
Potassium: 4.1 mmol/L (ref 3.5–5.3)
Sodium: 140 mmol/L (ref 135–146)
Total Bilirubin: 0.8 mg/dL (ref 0.2–1.2)
Total Protein: 7.5 g/dL (ref 6.1–8.1)

## 2020-09-20 LAB — TSH: TSH: 0.74 mIU/L (ref 0.40–4.50)

## 2020-09-20 LAB — MAGNESIUM: Magnesium: 2.3 mg/dL (ref 1.5–2.5)

## 2020-09-20 LAB — LIPID PANEL
Cholesterol: 166 mg/dL (ref <200)
HDL: 64 mg/dL (ref >=50)
LDL Cholesterol (Calc): 81 mg/dL (calc)
Non-HDL Cholesterol (Calc): 102 mg/dL (calc) (ref <130)
Total CHOL/HDL Ratio: 2.6 (calc) (ref <5.0)
Triglycerides: 113 mg/dL (ref <150)

## 2020-09-20 LAB — VITAMIN D 25 HYDROXY (VIT D DEFICIENCY, FRACTURES): Vit D, 25-Hydroxy: 148 ng/mL — ABNORMAL HIGH (ref 30–100)

## 2020-09-20 LAB — HEMOGLOBIN A1C
Hgb A1c MFr Bld: 5.5 % of total Hgb (ref <5.7)
Mean Plasma Glucose: 111 mg/dL
eAG (mmol/L): 6.2 mmol/L

## 2020-09-20 MED ORDER — BENICAR 20 MG PO TABS: ORAL_TABLET | ORAL | 3 refills | Status: DC

## 2020-09-21 LAB — URINALYSIS, ROUTINE W REFLEX MICROSCOPIC
Bacteria, UA: NONE SEEN /HPF
Bilirubin Urine: NEGATIVE
Glucose, UA: NEGATIVE
Hgb urine dipstick: NEGATIVE
Hyaline Cast: NONE SEEN /LPF
Ketones, ur: NEGATIVE
Nitrite: NEGATIVE
Protein, ur: NEGATIVE
Specific Gravity, Urine: 1.019 (ref 1.001–1.035)
pH: 6.5 (ref 5.0–8.0)

## 2020-09-21 LAB — MICROSCOPIC MESSAGE

## 2020-09-21 LAB — MICROALBUMIN / CREATININE URINE RATIO
Creatinine, Urine: 101 mg/dL (ref 20–275)
Microalb Creat Ratio: 10 ug/mg{creat}
Microalb, Ur: 1 mg/dL

## 2020-10-07 ENCOUNTER — Other Ambulatory Visit: Payer: Self-pay | Admitting: Adult Health

## 2020-10-07 MED ORDER — OLMESARTAN MEDOXOMIL 20 MG PO TABS: 20.0000 mg | ORAL_TABLET | Freq: Every day | ORAL | 3 refills | Status: DC

## 2020-10-10 ENCOUNTER — Other Ambulatory Visit: Payer: Self-pay | Admitting: Internal Medicine

## 2020-10-10 ENCOUNTER — Other Ambulatory Visit: Payer: Self-pay | Admitting: Adult Health

## 2020-10-10 DIAGNOSIS — E2839 Other primary ovarian failure: Secondary | ICD-10-CM

## 2020-10-10 DIAGNOSIS — M810 Age-related osteoporosis without current pathological fracture: Secondary | ICD-10-CM | POA: Insufficient documentation

## 2020-10-11 ENCOUNTER — Ambulatory Visit
Admission: RE | Admit: 2020-10-11 | Discharge: 2020-10-11 | Disposition: A | Discharge status: Home / Self Care | Payer: Medicare PPO | Source: Ambulatory Visit | Attending: Internal Medicine | Admitting: Internal Medicine

## 2020-10-11 ENCOUNTER — Ambulatory Visit
Admission: RE | Admit: 2020-10-11 | Discharge: 2020-10-11 | Disposition: A | Discharge status: Home / Self Care | Payer: Medicare PPO | Source: Ambulatory Visit | Attending: Physician Assistant | Admitting: Physician Assistant

## 2020-10-11 ENCOUNTER — Other Ambulatory Visit: Payer: Self-pay

## 2020-10-11 DIAGNOSIS — M810 Age-related osteoporosis without current pathological fracture: Secondary | ICD-10-CM | POA: Diagnosis not present

## 2020-10-11 DIAGNOSIS — Z1231 Encounter for screening mammogram for malignant neoplasm of breast: Secondary | ICD-10-CM

## 2020-10-11 DIAGNOSIS — Z780 Asymptomatic menopausal state: Secondary | ICD-10-CM | POA: Diagnosis not present

## 2020-10-11 DIAGNOSIS — E2839 Other primary ovarian failure: Secondary | ICD-10-CM

## 2020-10-11 DIAGNOSIS — M8588 Other specified disorders of bone density and structure, other site: Secondary | ICD-10-CM | POA: Diagnosis not present

## 2020-10-16 ENCOUNTER — Other Ambulatory Visit: Payer: Self-pay | Admitting: Internal Medicine

## 2020-10-16 DIAGNOSIS — M810 Age-related osteoporosis without current pathological fracture: Secondary | ICD-10-CM

## 2020-10-16 MED ORDER — ALENDRONATE SODIUM 70 MG PO TABS
ORAL_TABLET | ORAL | 3 refills | Status: DC
Start: 2020-10-16 — End: 2021-04-01

## 2020-10-16 NOTE — Progress Notes (Signed)
============================================================ ============================================================  -    Kea ,   Osteoporosis is slightly worse  ( T score is more negative )  compared to   last Bone Density in 2017, So sent in New Rx for Fosamax (Alendronate)                     (# 13 weekly pills is a 3 month supply)    - Must be taken on an empty stomach with a glass of water and                                    no food or other meds for 30 minutes  - only 1 x /week   - Then repeat the Bone Density scan in 2 years   - bill mck

## 2020-10-18 ENCOUNTER — Other Ambulatory Visit: Payer: Self-pay | Admitting: Internal Medicine

## 2020-10-18 DIAGNOSIS — R928 Other abnormal and inconclusive findings on diagnostic imaging of breast: Secondary | ICD-10-CM

## 2020-10-21 ENCOUNTER — Other Ambulatory Visit: Payer: Self-pay | Admitting: Adult Health

## 2020-10-21 DIAGNOSIS — E039 Hypothyroidism, unspecified: Secondary | ICD-10-CM

## 2020-10-21 MED ORDER — LIOTHYRONINE SODIUM 5 MCG PO TABS: 10.0000 ug | ORAL_TABLET | Freq: Two times a day (BID) | ORAL | 3 refills | Status: DC

## 2020-11-07 ENCOUNTER — Ambulatory Visit
Admission: RE | Admit: 2020-11-07 | Discharge: 2020-11-07 | Disposition: A | Discharge status: Home / Self Care | Payer: Medicare PPO | Source: Ambulatory Visit | Attending: Internal Medicine | Admitting: Internal Medicine

## 2020-11-07 ENCOUNTER — Other Ambulatory Visit: Payer: Self-pay

## 2020-11-07 ENCOUNTER — Other Ambulatory Visit: Payer: Self-pay | Admitting: Internal Medicine

## 2020-11-07 DIAGNOSIS — R928 Other abnormal and inconclusive findings on diagnostic imaging of breast: Secondary | ICD-10-CM

## 2020-11-07 DIAGNOSIS — R922 Inconclusive mammogram: Secondary | ICD-10-CM | POA: Diagnosis not present

## 2020-11-07 DIAGNOSIS — N6489 Other specified disorders of breast: Secondary | ICD-10-CM | POA: Diagnosis not present

## 2020-11-20 ENCOUNTER — Other Ambulatory Visit: Payer: Self-pay

## 2020-11-20 ENCOUNTER — Encounter (INDEPENDENT_AMBULATORY_CARE_PROVIDER_SITE_OTHER): Payer: Medicare PPO | Admitting: Ophthalmology

## 2020-11-20 DIAGNOSIS — H35033 Hypertensive retinopathy, bilateral: Secondary | ICD-10-CM | POA: Diagnosis not present

## 2020-11-20 DIAGNOSIS — I10 Essential (primary) hypertension: Secondary | ICD-10-CM

## 2020-11-20 DIAGNOSIS — H348322 Tributary (branch) retinal vein occlusion, left eye, stable: Secondary | ICD-10-CM

## 2020-11-20 DIAGNOSIS — H43813 Vitreous degeneration, bilateral: Secondary | ICD-10-CM | POA: Diagnosis not present

## 2020-11-29 ENCOUNTER — Ambulatory Visit
Admission: RE | Admit: 2020-11-29 | Discharge: 2020-11-29 | Disposition: A | Discharge status: Home / Self Care | Payer: Medicare PPO | Source: Ambulatory Visit | Attending: Internal Medicine | Admitting: Internal Medicine

## 2020-11-29 ENCOUNTER — Other Ambulatory Visit: Payer: Self-pay

## 2020-11-29 DIAGNOSIS — R928 Other abnormal and inconclusive findings on diagnostic imaging of breast: Secondary | ICD-10-CM

## 2021-03-04 ENCOUNTER — Other Ambulatory Visit: Payer: Self-pay

## 2021-03-04 MED ORDER — OLMESARTAN MEDOXOMIL 20 MG PO TABS: 20.0000 mg | ORAL_TABLET | Freq: Every day | ORAL | 3 refills | Status: DC

## 2021-03-09 ENCOUNTER — Other Ambulatory Visit: Payer: Self-pay | Admitting: Internal Medicine

## 2021-03-09 MED ORDER — OLMESARTAN MEDOXOMIL 20 MG PO TABS: ORAL_TABLET | ORAL | 3 refills | Status: DC

## 2021-03-14 DIAGNOSIS — H43393 Other vitreous opacities, bilateral: Secondary | ICD-10-CM | POA: Diagnosis not present

## 2021-03-14 DIAGNOSIS — H524 Presbyopia: Secondary | ICD-10-CM | POA: Diagnosis not present

## 2021-03-14 DIAGNOSIS — H35033 Hypertensive retinopathy, bilateral: Secondary | ICD-10-CM | POA: Diagnosis not present

## 2021-03-14 DIAGNOSIS — H348322 Tributary (branch) retinal vein occlusion, left eye, stable: Secondary | ICD-10-CM | POA: Diagnosis not present

## 2021-03-14 DIAGNOSIS — H3562 Retinal hemorrhage, left eye: Secondary | ICD-10-CM | POA: Diagnosis not present

## 2021-03-14 LAB — HM DIABETES EYE EXAM

## 2021-03-18 DIAGNOSIS — Z85828 Personal history of other malignant neoplasm of skin: Secondary | ICD-10-CM | POA: Diagnosis not present

## 2021-03-18 DIAGNOSIS — L821 Other seborrheic keratosis: Secondary | ICD-10-CM | POA: Diagnosis not present

## 2021-03-18 DIAGNOSIS — L812 Freckles: Secondary | ICD-10-CM | POA: Diagnosis not present

## 2021-03-18 DIAGNOSIS — D225 Melanocytic nevi of trunk: Secondary | ICD-10-CM | POA: Diagnosis not present

## 2021-03-18 DIAGNOSIS — Z08 Encounter for follow-up examination after completed treatment for malignant neoplasm: Secondary | ICD-10-CM | POA: Diagnosis not present

## 2021-03-18 DIAGNOSIS — L814 Other melanin hyperpigmentation: Secondary | ICD-10-CM | POA: Diagnosis not present

## 2021-03-18 DIAGNOSIS — L508 Other urticaria: Secondary | ICD-10-CM | POA: Diagnosis not present

## 2021-03-18 DIAGNOSIS — L570 Actinic keratosis: Secondary | ICD-10-CM | POA: Diagnosis not present

## 2021-03-19 ENCOUNTER — Encounter: Payer: Self-pay | Admitting: Internal Medicine

## 2021-03-27 NOTE — Progress Notes (Signed)
AWV  Assessment:   Annual Medicare Wellness Visit Due annually  Health maintenance reviewed  Declines shingrix for now Requested she send flu vaccine date once complete  Other migraine without status migrainosus, not intractable Avoid triggers, controlled  Hypothyroidism, unspecified type Hypothyroidism-check TSH level, continue medications the same, reminded to take on an empty stomach 30-44mins before food.   Rosacea Continue follow up derm as needed  Hypertension - continue medications, DASH diet, exercise and monitor at home. Call if greater than 130/80.   Hyperlipidemia, unspecified hyperlipidemia type -continue medications, check lipids, decrease fatty foods, increase activity.   Vitamin D deficiency -     Continue supplement - recheck   CKD II Increase fluids, avoid NSAIDS, monitor sugars, will monitor  History of BCC Derm follows annually   Osteoporosis, unspecified osteoporosis type, unspecified pathological fracture presence Declines foxamax, continue vitamin D, calcium Discussed resistance/impact exercises for lower body Possible benefit with K2 supplement Repeat DEXA in 2 years, plan endocrinology referral (which she is agreeable) if progressed at that point   Orders Placed This Encounter  Procedures   CBC with Differential/Platelet   COMPLETE METABOLIC PANEL WITH GFR   Magnesium   TSH   VITAMIN D 25 Hydroxy (Vit-D Deficiency, Fractures)     Future Appointments  Date Time Provider [place]  04/23/2021  2:00 PM Hayden Pedro, MD [facility] None  09/22/2021  3:00 PM Liane Comber, NP [facility] None  04/01/2022  2:00 PM Liane Comber, NP [facility] None      Subjective:   Daisy Oliver is a 85 y.o. female who presents for AWV and follow up. She has Hypertension; Hyperlipidemia; Migraines; Rosacea; Vitamin D deficiency; Medication management; Hypothyroidism; History of basal cell cancer; CKD (chronic kidney disease) stage 2,  GFR 60-89 ml/min; and Age-related osteoporosis without current pathological fracture on their problem list.  She is married, 2 children, no grandchildren. She is a retired Pharmacist, hospital, various ages.  Husband [name] has memory issues, had significant decline over the last year and has been referred to neuro.   She follows with Derm Dr. Pearline Cables annually for history of left cheek basal cell carcinoma.  BMI is Body mass index is 25.75 kg/m., she has been working on diet and exercise, [facility]. She has reduced ETOH, quit drinking daily, occasionally only.  Wt Readings from Last 3 Encounters:  04/01/21 150 lb (68 kg)  09/19/20 144 lb 6.4 oz (65.5 kg)  04/01/20 147 lb (66.7 kg)   Her blood pressure has been controlled at home, she is on benicar 20mg , today their BP is BP: 110/66 She does workout  She denies chest pain, shortness of breath, dizziness.   She is not on cholesterol medication and denies myalgias. Her cholesterol is at goal. The cholesterol last visit was:  Lab Results  Component Value Date   CHOL 166 09/19/2020   HDL 64 09/19/2020   LDLCALC 81 09/19/2020   TRIG 113 09/19/2020   CHOLHDL 2.6 09/19/2020    Has stable CKD II; last gfr:  Lab Results  Component Value Date   GFRNONAA 66 09/19/2020   Patient is on Vitamin D supplement, she stopped for several weeks and reduced from 4000 IU to 2000 IU daily  Lab Results  Component Value Date   VD25OH 148 (H) 09/19/2020   She is on thyroid medication, cytomel 8mcg (29mcg BID). Her medication was not changed last visit.   Lab Results  Component Value Date   TSH 0.74  09/19/2020       Medication Review  Current Outpatient Medications (Endocrine & Metabolic):    liothyronine (CYTOMEL) 5 MCG tablet, Take 2 tablets (10 mcg total) by mouth 2 (two) times daily.   alendronate (FOSAMAX) 70 MG tablet, Take 1 tablet 1st thing in the morning  with a full glass of water on an empty stomach for 30 minutes once per week (Patient  not taking: Reported on 04/01/2021)  Current Outpatient Medications (Cardiovascular):    olmesartan (BENICAR) 20 MG tablet, Take  1 tablet  Daily  for BP  Current Outpatient Medications (Respiratory):    DiphenhydrAMINE HCl (ALLERGY MED PO), Take by mouth. [facility] - generic allergy med   Current Outpatient Medications (Hematological):    Cyanocobalamin (VITAMIN B 12 PO), Take by mouth daily.  Current Outpatient Medications (Other):    BIOTIN PO, Take by mouth daily.   Calcium-Magnesium-Vitamin D (CALCIUM 1200+D3 PO), Take by mouth.   clotrimazole-betamethasone (LOTRISONE) cream, Apply 1 application topically 2 (two) times daily.   Digestive Enzymes (BETAINE HCL PO), Take by mouth. Ortho Biotic   Ergocalciferol (VITAMIN D2) 2000 UNITS TABS, Take by mouth.   erythromycin with ethanol (THERAMYCIN) 2 % external solution, Apply topically daily.   L-Methylfolate-Algae-B12-B6 (METANX PO), Take by mouth daily.   Magnesium 500 MG CAPS, Take by mouth.   meclizine (ANTIVERT) 25 MG tablet, 1/2-1 pill up to 3 times daily for motion sickness/dizziness   Misc Natural Products (OSTEO BI-FLEX JOINT SHIELD PO), Take by mouth. Osteo Bi-Flex   NEOMYCIN-POLYMYXIN-HYDROCORTISONE (CORTISPORIN) 1 % SOLN OTIC solution, Place 6 drops into both ears 4 (four) times daily.   Omega-3 Fatty Acids (FISH OIL PO), Take by mouth daily. Fish Oil   OVER THE COUNTER MEDICATION, daily. P 5 P50   OVER THE COUNTER MEDICATION,    Taurine 1000 MG CAPS, Take by mouth.   tretinoin (RETIN-A) 0.05 % cream, Apply topically at bedtime.   Zinc 50 MG CAPS, Take by mouth.  Current Problems (verified) Patient Active Problem List   Diagnosis Date Noted   Age-related osteoporosis without current pathological fracture 10/10/2020   CKD (chronic kidney disease) stage 2, GFR 60-89 ml/min 03/29/2020   History of basal cell cancer 03/17/2018   Vitamin D deficiency 08/28/2014   Medication management 08/28/2014   Hypothyroidism 08/28/2014    Hypertension 08/25/2013   Hyperlipidemia 08/25/2013   Migraines 08/25/2013   Rosacea 08/25/2013   Screening Test and Preventative care:  Immunization History  Administered Date(s) Administered   DT (Pediatric) 08/28/2014   Influenza, High Dose Seasonal PF 04/11/2019   PFIZER(Purple Top)SARS-COV-2 Vaccination 05/10/2019, 05/31/2019   Pneumococcal Conjugate-13 08/28/2015   Pneumococcal Polysaccharide-23 03/08/2017    Last colonoscopy: 11/2013 DONE Last mammogram: 11/07/2020 Last pap smear/pelvic exam: 2010, DONE DEXA: 10/11/2020 + osteoporosis t -2.6 < 2.8 Dr. Melford Aase sent in alendronate but patient preference to not start, working on supplements and exercise, recheck in 2 years  Prior vaccinations: TD or Tdap: 2016  Influenza: 2021 [facility] Pneumococcal: 2018 Prevnar 13: 2017 Shingles/Zostavax: holding off for now Covid 19: 2/2, 2021, pfizer - declines booster, had SE  Names of Other Physician/Practitioners you currently use: 1. [facility]- here for primary care 2. Dr. Herbert Deaner, eye doctor, last visit - s/p bilateral cataract surgery, last 2022 Also Following with Dr. Zigmund Daniel for retinal hemorrhage x Feb 27th, ? Mild improvement at 6 month follow up, will have follow up next month  3.  Microbiologist,  last visit 2022  Patient Care Team: [name], MD as PCP - General (Internal Medicine) Izora Gala, MD as Consulting Physician (Otolaryngology) Laurence Spates, MD (Inactive) as Consulting Physician (Gastroenterology) Michel Santee, MD as Consulting Physician (Neurology) Lollie Sails, MD as Referring Physician (Internal Medicine)   Allergies Allergies  Allergen Reactions   Barbiturates    Pentothal [Thiopental]    Sulfa Antibiotics    Wheat Bran Nausea And Vomiting    SURGICAL HISTORY She  has a past surgical history that includes Radial head arthroplasty (Right, 09/16/2012) and  Cataract extraction, bilateral (2020). FAMILY HISTORY Her family history includes Brain cancer (age of onset: 81) in her father; Cancer in her paternal grandfather; Heart disease in her mother; Hypertension in her sister and sister; Osteoporosis in her sister and sister; Suicidality in her paternal grandfather. SOCIAL HISTORY She  reports that she quit smoking about 37 years ago. Her smoking use included cigarettes. She has a 44.00 pack-year smoking history. She has never used smokeless tobacco. She reports that she does not currently use alcohol. She reports that she does not use drugs.  MEDICARE WELLNESS OBJECTIVES: Physical activity: Current Exercise Habits: Home exercise routine, Type of exercise: walking;strength training/weights, Time (Minutes): 45, Frequency (Times/Week): 5, Weekly Exercise (Minutes/Week): 225, Intensity: Mild, Exercise limited by: None identified Cardiac risk factors: Cardiac Risk Factors include: advanced age (>32men, >29 women);dyslipidemia;hypertension;smoking/ tobacco exposure Depression/mood screen:   Depression screen [facility] 2/9 04/01/2021  Decreased Interest 0  Down, Depressed, Hopeless 1  PHQ - 2 Score 1    ADLs:  In your present state of health, do you have any difficulty performing the following activities: 04/01/2021 04/01/2020  Hearing? N N  Vision? N N  Difficulty concentrating or making decisions? N N  Walking or climbing stairs? N N  Dressing or bathing? N N  Doing errands, shopping? N N  Some recent data might be hidden     Cognitive Testing  Alert? Yes  Normal Appearance?Yes  Oriented to person? Yes  Place? Yes   Time? Yes  Recall of three objects?  Yes  Can perform simple calculations? Yes  Displays appropriate judgment?Yes  Can read the correct time from a watch face?Yes  EOL planning: Does Patient Have a Medical Advance Directive?: Yes Type of Advance Directive: Healthcare Power of Attorney, Living will Does patient want to make changes  to medical advance directive?: No - Patient declined Copy of [place] in Chart?: No - copy requested     Review of Systems  Constitutional:  Negative for malaise/fatigue and weight loss.  HENT:  Negative for hearing loss and tinnitus.   Eyes:  Negative for blurred vision and double vision.  Respiratory:  Negative for cough, sputum production, shortness of breath and wheezing.   Cardiovascular:  Negative for chest pain, palpitations, orthopnea, claudication, leg swelling and PND.  Gastrointestinal:  Negative for abdominal pain, blood in stool, constipation, diarrhea, heartburn, melena, nausea and vomiting.  Genitourinary: Negative.   Musculoskeletal:  Negative for falls, joint pain and myalgias.  Skin:  Negative for rash.  Neurological:  Negative for dizziness, tingling, sensory change, weakness and headaches.  Endo/Heme/Allergies:  Negative for polydipsia.  Psychiatric/Behavioral: Negative.  Negative for depression, memory loss, substance abuse and suicidal ideas. The patient is not nervous/anxious and does not have insomnia.   All other systems reviewed and are negative.   Objective:     Blood pressure 110/66, pulse 68, temperature (!) 97.5 F (36.4 C), weight 150 lb (68 kg), SpO2  97 %. Body mass index is 25.75 kg/m.  General appearance: alert, no distress, WD/WN,  female HEENT: normocephalic, sclerae anicteric, TMs pearly, nares patent, no discharge or erythema, pharynx normal Oral cavity: MMM, no lesions Neck: supple, no lymphadenopathy, no thyromegaly, no masses Heart: RRR, normal S1, S2, no murmurs Lungs: CTA bilaterally, no wheezes, rhonchi, or rales Abdomen: +bs, soft, non tender, non distended, no masses, no hepatomegaly, no splenomegaly Musculoskeletal: nontender, no swelling, no obvious deformity Extremities: no edema, no cyanosis, no clubbing Pulses: 2+ symmetric, upper and lower extremities, normal cap refill Neurological: alert, oriented x 3,  CN2-12 intact, strength normal upper extremities and lower extremities, DTRs 2+ bilateral arms and legs, no cerebellar signs, gait normal Skin: sparse eyebrows bilaterally Psychiatric: normal affect, behavior normal, pleasant     Medicare Attestation I have personally reviewed: The patient's medical and social history Their use of alcohol, tobacco or illicit drugs Their current medications and supplements The patient's functional ability including ADLs,fall risks, home safety risks, cognitive, and hearing and visual impairment Diet and physical activities Evidence for depression or mood disorders  The patient's weight, height, BMI, and visual acuity have been recorded in the chart.  I have made referrals, counseling, and provided education to the patient based on review of the above and I have provided the patient with a written personalized care plan for preventive services.      Izora Ribas, NP   04/01/2021

## 2021-04-01 ENCOUNTER — Encounter: Payer: Self-pay | Admitting: Adult Health

## 2021-04-01 ENCOUNTER — Other Ambulatory Visit: Payer: Self-pay

## 2021-04-01 ENCOUNTER — Ambulatory Visit: Payer: Medicare PPO | Admitting: Adult Health

## 2021-04-01 VITALS — BP 110/66 | HR 68 | Temp 97.5°F | Wt 150.0 lb

## 2021-04-01 DIAGNOSIS — E559 Vitamin D deficiency, unspecified: Secondary | ICD-10-CM | POA: Diagnosis not present

## 2021-04-01 DIAGNOSIS — G43809 Other migraine, not intractable, without status migrainosus: Secondary | ICD-10-CM | POA: Diagnosis not present

## 2021-04-01 DIAGNOSIS — M810 Age-related osteoporosis without current pathological fracture: Secondary | ICD-10-CM

## 2021-04-01 DIAGNOSIS — Z85828 Personal history of other malignant neoplasm of skin: Secondary | ICD-10-CM | POA: Diagnosis not present

## 2021-04-01 DIAGNOSIS — Z79899 Other long term (current) drug therapy: Secondary | ICD-10-CM

## 2021-04-01 DIAGNOSIS — I10 Essential (primary) hypertension: Secondary | ICD-10-CM | POA: Diagnosis not present

## 2021-04-01 DIAGNOSIS — L719 Rosacea, unspecified: Secondary | ICD-10-CM | POA: Diagnosis not present

## 2021-04-01 DIAGNOSIS — Z6824 Body mass index (BMI) 24.0-24.9, adult: Secondary | ICD-10-CM

## 2021-04-01 DIAGNOSIS — N182 Chronic kidney disease, stage 2 (mild): Secondary | ICD-10-CM

## 2021-04-01 DIAGNOSIS — E039 Hypothyroidism, unspecified: Secondary | ICD-10-CM | POA: Diagnosis not present

## 2021-04-01 DIAGNOSIS — R6889 Other general symptoms and signs: Secondary | ICD-10-CM | POA: Diagnosis not present

## 2021-04-01 DIAGNOSIS — Z0001 Encounter for general adult medical examination with abnormal findings: Secondary | ICD-10-CM | POA: Diagnosis not present

## 2021-04-01 DIAGNOSIS — Z0000 Encounter for general adult medical examination without abnormal findings: Secondary | ICD-10-CM

## 2021-04-01 DIAGNOSIS — E785 Hyperlipidemia, unspecified: Secondary | ICD-10-CM

## 2021-04-02 LAB — CBC WITH DIFFERENTIAL/PLATELET
Absolute Monocytes: 758 cells/uL (ref 200–950)
Basophils Absolute: 68 cells/uL (ref 0–200)
Basophils Relative: 0.9 %
Eosinophils Absolute: 188 cells/uL (ref 15–500)
Eosinophils Relative: 2.5 %
HCT: 40.7 % (ref 35.0–45.0)
Hemoglobin: 13.7 g/dL (ref 11.7–15.5)
Lymphs Abs: 1808 cells/uL (ref 850–3900)
MCH: 31.4 pg (ref 27.0–33.0)
MCHC: 33.7 g/dL (ref 32.0–36.0)
MCV: 93.1 fL (ref 80.0–100.0)
MPV: 10.1 fL (ref 7.5–12.5)
Monocytes Relative: 10.1 %
Neutro Abs: 4680 cells/uL (ref 1500–7800)
Neutrophils Relative %: 62.4 %
Platelets: 214 10*3/uL (ref 140–400)
RBC: 4.37 10*6/uL (ref 3.80–5.10)
RDW: 11.9 % (ref 11.0–15.0)
Total Lymphocyte: 24.1 %
WBC: 7.5 10*3/uL (ref 3.8–10.8)

## 2021-04-02 LAB — COMPLETE METABOLIC PANEL WITH GFR
AG Ratio: 1.4 (calc) (ref 1.0–2.5)
ALT: 16 U/L (ref 6–29)
AST: 23 U/L (ref 10–35)
Albumin: 4.6 g/dL (ref 3.6–5.1)
Alkaline phosphatase (APISO): 62 U/L (ref 37–153)
BUN/Creatinine Ratio: 43 (calc) — ABNORMAL HIGH (ref 6–22)
BUN: 31 mg/dL — ABNORMAL HIGH (ref 7–25)
CO2: 32 mmol/L (ref 20–32)
Calcium: 10.3 mg/dL (ref 8.6–10.4)
Chloride: 101 mmol/L (ref 98–110)
Creat: 0.72 mg/dL (ref 0.60–0.95)
Globulin: 3.3 g/dL (calc) (ref 1.9–3.7)
Glucose, Bld: 117 mg/dL — ABNORMAL HIGH (ref 65–99)
Potassium: 5 mmol/L (ref 3.5–5.3)
Sodium: 143 mmol/L (ref 135–146)
Total Bilirubin: 0.7 mg/dL (ref 0.2–1.2)
Total Protein: 7.9 g/dL (ref 6.1–8.1)
eGFR: 83 mL/min/{1.73_m2} (ref >=60)

## 2021-04-02 LAB — TSH: TSH: 1.01 mIU/L (ref 0.40–4.50)

## 2021-04-02 LAB — VITAMIN D 25 HYDROXY (VIT D DEFICIENCY, FRACTURES): Vit D, 25-Hydroxy: 102 ng/mL — ABNORMAL HIGH (ref 30–100)

## 2021-04-02 LAB — MAGNESIUM: Magnesium: 2.4 mg/dL (ref 1.5–2.5)

## 2021-04-23 ENCOUNTER — Other Ambulatory Visit: Payer: Self-pay

## 2021-04-23 ENCOUNTER — Encounter (INDEPENDENT_AMBULATORY_CARE_PROVIDER_SITE_OTHER): Payer: Medicare PPO | Admitting: Ophthalmology

## 2021-04-23 DIAGNOSIS — H35033 Hypertensive retinopathy, bilateral: Secondary | ICD-10-CM | POA: Diagnosis not present

## 2021-04-23 DIAGNOSIS — H34832 Tributary (branch) retinal vein occlusion, left eye, with macular edema: Secondary | ICD-10-CM

## 2021-04-23 DIAGNOSIS — H43813 Vitreous degeneration, bilateral: Secondary | ICD-10-CM

## 2021-04-23 DIAGNOSIS — I10 Essential (primary) hypertension: Secondary | ICD-10-CM | POA: Diagnosis not present

## 2021-07-22 ENCOUNTER — Ambulatory Visit: Payer: Medicare PPO | Admitting: Adult Health

## 2021-07-22 ENCOUNTER — Encounter: Payer: Self-pay | Admitting: Adult Health

## 2021-07-22 VITALS — BP 114/78 | HR 62 | Temp 97.9°F | Wt 150.0 lb

## 2021-07-22 DIAGNOSIS — I10 Essential (primary) hypertension: Secondary | ICD-10-CM

## 2021-07-22 DIAGNOSIS — R42 Dizziness and giddiness: Secondary | ICD-10-CM | POA: Diagnosis not present

## 2021-07-22 DIAGNOSIS — H6500 Acute serous otitis media, unspecified ear: Secondary | ICD-10-CM

## 2021-07-22 DIAGNOSIS — R2681 Unsteadiness on feet: Secondary | ICD-10-CM

## 2021-07-22 DIAGNOSIS — Z9109 Other allergy status, other than to drugs and biological substances: Secondary | ICD-10-CM

## 2021-07-22 MED ORDER — MECLIZINE HCL 25 MG PO TABS: ORAL_TABLET | ORAL | 0 refills | Status: DC

## 2021-07-22 MED ORDER — PREDNISONE 20 MG PO TABS: ORAL_TABLET | ORAL | 0 refills | Status: DC

## 2021-07-22 NOTE — Progress Notes (Signed)
Assessment and Plan: ? ?Daisy Oliver was seen today for dizziness. ? ?Diagnoses and all orders for this visit: ? ?Dizziness/unsteady gait ?With benign neuro exam, not clearly elicited in office ?Suspect with serous otitis/inner ear etiology, may be flaring due to allergies; switch to allegra, zyrtec or claritin ?Will give steroid taper ?Does have sig CVA risk factors; if not improving in 3-5 days with above plan or with any new neuro sx encouraged to contact office and will set up for neuroimaging/MRI ?-     meclizine (ANTIVERT) 25 MG tablet; 1/2-1 pill up to 3 times daily for motion sickness/dizziness ? ?Environmental allergies ?- Allegra/zyrtec OTC instead of benadryl, increase H20, allergy hygiene explained. ? ?Non-recurrent acute serous otitis media, unspecified laterality ?-     predniSONE (DELTASONE) 20 MG tablet; 2 tablets daily for 3 days, 1 tablet daily for 4 days. ? ?Unsteady gait ?Monitor for persistence; plan head MRI if not improving ? ? ?Discussed med's effects and SE's.   ?Over 30 minutes of exam, counseling, chart review, and critical decision making was performed.  ? ?Future Appointments  ?Date Time Provider [place]  ?09/23/2021  3:00 PM Liane Comber, NP [facility] None  ?09/24/2021  2:00 PM Hayden Pedro, MD [facility] None  ?04/01/2022  2:00 PM Liane Comber, NP [facility] None  ? ? ?------------------------------------------------------------------------------------------------------------------ ? ? ?HPI ?BP 114/78   Pulse 62   Temp 97.9 ?F (36.6 ?C)   Wt 150 lb (68 kg)   SpO2 99%   BMI 25.75 kg/m?  ?86 y.o.female remote former smoker with htn, hld presents for evaluation of dizziness/feeling unsteady x 1 week. She reports hx of similar that has responded well to meclizine (last in 2020).  ? ?She reports in the last week has had intermittent dizziness and feeling unsteady on her feet with walking. Denies any falls. "I just feel off."  ? ?Denies CP, dyspnea, palpitations, edema,  fatigue, or worse sx with sitting to standing. Denies HA, blurry vision. Denies unilateral weakness, numbness, tingling.  ? ?She also notes allergy sx, watery/itchy eyes, sneezing, some nasal congestion, ear fullness. Has started on diphenhydramine in the last week (only takes once daily), hasn't seen improvement in her dizziness. She notes bending over her sink makes her dizziness worse. Denies spinning sensation.  ? ?Her blood pressure has been controlled at home, today their BP is BP: 114/78 ? She does workout. She denies chest pain, shortness of breath, dizziness. ? ?Lab Results  ?Component Value Date  ? CHOL 166 09/19/2020  ? HDL 64 09/19/2020  ? [place] 81 09/19/2020  ? TRIG 113 09/19/2020  ? CHOLHDL 2.6 09/19/2020  ? ? ? ?Past Medical History:  ?Diagnosis Date  ? 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 COVID-19 08/26/2020  ? Fracture of radial head, right, closed 09/16/2012  ? GERD (gastroesophageal reflux disease)   ? pepcid prn  ? Hypertension   ? Hypothyroidism   ?  ? ?Allergies  ?Allergen Reactions  ? Barbiturates   ? Pentothal [Thiopental]   ? Sulfa Antibiotics   ? Wheat Bran Nausea And Vomiting  ? ? ?Current Outpatient Medications on File Prior to Visit  ?Medication Sig  ? BIOTIN PO Take by mouth daily.  ? Calcium-Magnesium-Vitamin D (CALCIUM 1200+D3 PO) Take by mouth.  ? clotrimazole-betamethasone (LOTRISONE) cream Apply 1 application topically 2 (two) times daily.  ? Cyanocobalamin (VITAMIN B 12 PO) Take by mouth daily.  ? Digestive Enzymes (BETAINE HCL PO) Take by mouth. Ortho  Biotic  ? Ergocalciferol (VITAMIN D2) 2000 UNITS TABS Take by mouth.  ? erythromycin with ethanol (THERAMYCIN) 2 % external solution Apply topically daily.  ? L-Methylfolate-Algae-B12-B6 (METANX PO) Take by mouth daily.  ? liothyronine (CYTOMEL) 5 MCG tablet Take 2 tablets (10 mcg total) by mouth 2 (two) times daily.  ? Magnesium 500 MG CAPS Take by mouth.  ? Misc Natural Products (OSTEO BI-FLEX JOINT SHIELD  PO) Take by mouth. Osteo Bi-Flex  ? NEOMYCIN-POLYMYXIN-HYDROCORTISONE (CORTISPORIN) 1 % SOLN OTIC solution Place 6 drops into both ears 4 (four) times daily.  ? olmesartan (BENICAR) 20 MG tablet Take  1 tablet  Daily  for BP  ? Omega-3 Fatty Acids (FISH OIL PO) Take by mouth daily. Fish Oil  ? OVER THE COUNTER MEDICATION daily. P 5 P50  ? OVER THE COUNTER MEDICATION   ? Taurine 1000 MG CAPS Take by mouth.  ? tretinoin (RETIN-A) 0.05 % cream Apply topically at bedtime.  ? Zinc 50 MG CAPS Take by mouth.  ? ?No current facility-administered medications on file prior to visit.  ? ? ?Allergies:  ?Allergies  ?Allergen Reactions  ? Barbiturates   ? Pentothal [Thiopental]   ? Sulfa Antibiotics   ? Wheat Bran Nausea And Vomiting  ? ?Surgical History:  ?She  has a past surgical history that includes Radial head arthroplasty (Right, 09/16/2012) and Cataract extraction, bilateral (2020). ?Family History:  ?Herfamily history includes Brain cancer (age of onset: 43) in her father; Cancer in her paternal grandfather; Heart disease in her mother; Hypertension in her sister and sister; Osteoporosis in her sister and sister; Suicidality in her paternal grandfather. ?Social History:  ? reports that she quit smoking about 37 years ago. Her smoking use included cigarettes. She has a 44.00 pack-year smoking history. She has never used smokeless tobacco. She reports that she does not currently use alcohol. She reports that she does not use drugs. ? ? ?ROS: all negative except above.  ? ?Physical Exam: ? ?BP 114/78   Pulse 62   Temp 97.9 ?F (36.6 ?C)   Wt 150 lb (68 kg)   SpO2 99%   BMI 25.75 kg/m?  ? ?General Appearance: Well nourished, in no apparent distress. ?Eyes: PERRLA, EOMs, conjunctiva no swelling or erythema ?Sinuses: No Frontal/maxillary tenderness ?ENT/Mouth: Ext aud canals clear, TMs without erythema, bulging. No erythema, swelling, or exudate on post pharynx.  Tonsils not swollen or erythematous. Hearing normal.  ?Neck:  Supple, neg carotid bruit, dizziness not elicited with neck extension  ?Respiratory: Respiratory effort normal, BS equal bilaterally without rales, rhonchi, wheezing or stridor.  ?Cardio: RRR with no MRGs. Brisk peripheral pulses without edema.  ?Lymphatics: Non tender without lymphadenopathy.  ?Musculoskeletal: Full ROM, 5/5 strength, normal gait.  ?Skin: Warm, dry without rashes, lesions, ecchymosis.  ?Neuro: Cranial nerves intact. Normal muscle tone, no cerebellar symptoms, neg pronator drift. Mildly unsteady gait, very unsteady tandem gait. Sensation intact throughout. Strength symmetrical and intact throughout UE/LE.  ?Psych: Awake and oriented X 3, normal affect, Insight and Judgment appropriate.  ?  ? ?Izora Ribas, NP ?12:56 PM ?[facility] ? ?

## 2021-07-23 ENCOUNTER — Other Ambulatory Visit: Payer: Self-pay | Admitting: Adult Health

## 2021-07-23 MED ORDER — FEXOFENADINE HCL 180 MG PO TABS: 180.0000 mg | ORAL_TABLET | Freq: Every day | ORAL | Status: DC

## 2021-07-31 ENCOUNTER — Encounter: Payer: Self-pay | Admitting: Adult Health

## 2021-08-06 DIAGNOSIS — M545 Low back pain, unspecified: Secondary | ICD-10-CM | POA: Diagnosis not present

## 2021-08-06 DIAGNOSIS — M533 Sacrococcygeal disorders, not elsewhere classified: Secondary | ICD-10-CM | POA: Diagnosis not present

## 2021-08-13 ENCOUNTER — Ambulatory Visit: Payer: Medicare PPO | Admitting: Adult Health

## 2021-08-13 ENCOUNTER — Encounter: Payer: Self-pay | Admitting: Adult Health

## 2021-08-13 VITALS — BP 112/70 | HR 69 | Temp 97.9°F | Resp 17 | Ht 64.0 in | Wt 151.4 lb

## 2021-08-13 DIAGNOSIS — W57XXXS Bitten or stung by nonvenomous insect and other nonvenomous arthropods, sequela: Secondary | ICD-10-CM | POA: Diagnosis not present

## 2021-08-13 DIAGNOSIS — M2550 Pain in unspecified joint: Secondary | ICD-10-CM | POA: Diagnosis not present

## 2021-08-13 DIAGNOSIS — R42 Dizziness and giddiness: Secondary | ICD-10-CM | POA: Diagnosis not present

## 2021-08-13 DIAGNOSIS — R2681 Unsteadiness on feet: Secondary | ICD-10-CM | POA: Diagnosis not present

## 2021-08-13 MED ORDER — OLMESARTAN MEDOXOMIL 20 MG PO TABS: ORAL_TABLET | ORAL | 3 refills | Status: DC

## 2021-08-13 NOTE — Progress Notes (Signed)
Assessment and Plan: ? ?Daisy Oliver was seen today for follow-up. ? ?Diagnoses and all orders for this visit: ? ?Polyarthralgia ?Tick bite, unspecified site, sequela ?With some vague sx, hx of several tick bites of unknown duration, also rash though unknown if bulls eye characteristic, will r/o Lyme's  ?HPI/family hx negative for sx typical for autoimmune - defer workup at this time after discussion - clarify if sister has OA vs RA ?If negative follow up with Dr. Mardelle Matte to pursue injections/PT as recommended ?-     Sedimentation rate ?-     C-reactive protein ?-     B. [place]dorfi Antibodies ? ?Unsteady gait ?Ongoing, ? R/t ortho concerns compounded by postural hypotension, vs central etiology. Unsteady gait today but otherwise normal neuro.  ?Discussed if persistent consider MRI/neuro referral. She declines at this time.  ? ?Postural dizziness ?Hypertension ?Possibly having some postural hypotension in AM, will try backing off on BP med, goal BPs around 130/80 and evaluate if persistent or improved -  ?-     olmesartan (BENICAR) 20 MG tablet; Take 1/2 tablet  Daily  for BP ? ?Further disposition pending results of labs. Discussed med's effects and SE's.   ?Over 30 minutes of exam, counseling, chart review, and critical decision making was performed.  ? ?Future Appointments  ?Date Time Provider [place]  ?09/23/2021  3:00 PM Liane Comber, NP [facility] None  ?09/24/2021  2:00 PM Hayden Pedro, MD [facility] None  ?04/01/2022  2:00 PM Liane Comber, NP [facility] None  ? ? ?------------------------------------------------------------------------------------------------------------------ ? ? ?HPI ?BP 112/70   Pulse 69   Temp 97.9 ?F (36.6 ?C)   Resp 17   Ht '5\' 4"'$  (1.626 m)   Wt 151 lb 6.4 oz (68.7 kg)   SpO2 96%   BMI 25.99 kg/m?  ?86 y.o.female presents reqeusting r/o Lyme's.  ? ?She reports intermittent ongoing unsteady gait x 1 month, also some muscle aches and arthralgias (saw ortho Dr. Mardelle Matte who  suggested injections, PT), also friends have commented that she is not quite herself, making more spelling errors in emails atypical for her, has had intermittent sense of instability. She is reporting hx of several tick bite exposures, 3 in the last month, 1 back in Jan 2023, several in 2022 requesting to r/o Lyme's. She reports husband noted a rash to her R buttock a few weeks ago, but can't clarify if was bull's eye.  ? ?She notes has some dizziness with position changes, worst first in AM.  ? ?Her blood pressure has been controlled at home (110s/70s on olmesartan 20 mg), today their BP is BP: 112/70 ? She does workout. She denies chest pain, shortness of breath, dizziness. ? ? ?Past Medical History:  ?Diagnosis Date  ? 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 diagnosed at age 72  ? COVID-19 08/26/2020  ? Fracture of radial head, right, closed 09/16/2012  ? GERD (gastroesophageal reflux disease)   ? pepcid prn  ? Hypertension   ? Hypothyroidism   ?  ? ?Allergies  ?Allergen Reactions  ? Barbiturates   ? Pentothal [Thiopental]   ? Sulfa Antibiotics   ? Wheat Bran Nausea And Vomiting  ? ? ?Current Outpatient Medications on File Prior to Visit  ?Medication Sig  ? BIOTIN PO Take by mouth daily.  ? Calcium-Magnesium-Vitamin D (CALCIUM 1200+D3 PO) Take by mouth.  ? clotrimazole-betamethasone (LOTRISONE) cream Apply 1 application topically 2 (two) times daily.  ? Cyanocobalamin (VITAMIN B 12 PO) Take by mouth daily.  ?  Digestive Enzymes (BETAINE HCL PO) Take by mouth. Ortho Biotic  ? Ergocalciferol (VITAMIN D2) 2000 UNITS TABS Take by mouth.  ? erythromycin with ethanol (THERAMYCIN) 2 % external solution Apply topically daily.  ? fexofenadine (ALLEGRA) 180 MG tablet Take 1 tablet (180 mg total) by mouth daily.  ? L-Methylfolate-Algae-B12-B6 (METANX PO) Take by mouth daily.  ? liothyronine (CYTOMEL) 5 MCG tablet Take 2 tablets (10 mcg total) by mouth 2 (two) times daily.  ? Magnesium 500 MG CAPS Take by mouth.  ? meclizine  (ANTIVERT) 25 MG tablet 1/2-1 pill up to 3 times daily for motion sickness/dizziness  ? Misc Natural Products (OSTEO BI-FLEX JOINT SHIELD PO) Take by mouth. Osteo Bi-Flex  ? NEOMYCIN-POLYMYXIN-HYDROCORTISONE (CORTISPORIN) 1 % SOLN OTIC solution Place 6 drops into both ears 4 (four) times daily.  ? olmesartan (BENICAR) 20 MG tablet Take  1 tablet  Daily  for BP  ? Omega-3 Fatty Acids (FISH OIL PO) Take by mouth daily. Fish Oil  ? OVER THE COUNTER MEDICATION daily. P 5 P50  ? OVER THE COUNTER MEDICATION   ? predniSONE (DELTASONE) 20 MG tablet 2 tablets daily for 3 days, 1 tablet daily for 4 days.  ? Taurine 1000 MG CAPS Take by mouth.  ? tretinoin (RETIN-A) 0.05 % cream Apply topically at bedtime.  ? Zinc 50 MG CAPS Take by mouth.  ? ?No current facility-administered medications on file prior to visit.  ? ?Allergies:  ?Allergies  ?Allergen Reactions  ? Barbiturates   ? Pentothal [Thiopental]   ? Sulfa Antibiotics   ? Wheat Bran Nausea And Vomiting  ? ?Surgical History:  ?She  has a past surgical history that includes Radial head arthroplasty (Right, 09/16/2012) and Cataract extraction, bilateral (2020). ?Family History:  ?Herfamily history includes Brain cancer (age of onset: 17) in her father; Cancer in her paternal grandfather; Heart disease in her mother; Hypertension in her sister and sister; Osteoporosis in her sister and sister; Suicidality in her paternal grandfather. ?Social History:  ? reports that she quit smoking about 37 years ago. Her smoking use included cigarettes. She has a 44.00 pack-year smoking history. She has never used smokeless tobacco. She reports that she does not currently use alcohol. She reports that she does not use drugs. ? ? ?ROS: all negative except above.  ? ?Physical Exam: ? ?BP 112/70   Pulse 69   Temp 97.9 ?F (36.6 ?C)   Resp 17   Ht '5\' 4"'$  (1.626 m)   Wt 151 lb 6.4 oz (68.7 kg)   SpO2 96%   BMI 25.99 kg/m?  ? ? ?General Appearance: Well nourished, in no apparent  distress. ?Eyes: PERRLA, EOMs, conjunctiva no swelling or erythema ?Sinuses: No Frontal/maxillary tenderness ?ENT/Mouth: Ext aud canals clear, TMs without erythema, bulging. No erythema, swelling, or exudate on post pharynx.  Tonsils not swollen or erythematous. Hearing normal.  ?Neck: Supple, neg carotid bruit, dizziness not elicited with neck extension  ?Respiratory: Respiratory effort normal, BS equal bilaterally without rales, rhonchi, wheezing or stridor.  ?Cardio: RRR with no MRGs. Brisk peripheral pulses without edema.  ?Lymphatics: Non tender without lymphadenopathy.  ?Musculoskeletal: Full ROM, 5/5 strength, normal gait.  ?Skin: Warm, dry without rashes, lesions, ecchymosis.  ?Neuro: Cranial nerves intact. Normal muscle tone, no cerebellar symptoms, neg pronator drift. Mildly unsteady gait, very unsteady tandem gait. Sensation intact throughout. Strength symmetrical and intact throughout UE/LE.  ?Psych: Awake and oriented X 3, normal affect, Insight and Judgment appropriate.   ? ?Izora Ribas, NP ?  11:20 AM ?[facility] ? ?

## 2021-08-14 LAB — B. BURGDORFI ANTIBODIES: B burgdorferi Ab IgG+IgM: <0.9 index

## 2021-08-14 LAB — C-REACTIVE PROTEIN: CRP: 0.5 mg/L (ref <8.0)

## 2021-08-14 LAB — SEDIMENTATION RATE: Sed Rate: 22 mm/h (ref 0–30)

## 2021-08-15 ENCOUNTER — Other Ambulatory Visit: Payer: Self-pay | Admitting: Adult Health

## 2021-08-15 DIAGNOSIS — H6500 Acute serous otitis media, unspecified ear: Secondary | ICD-10-CM

## 2021-08-15 DIAGNOSIS — M2550 Pain in unspecified joint: Secondary | ICD-10-CM

## 2021-08-15 DIAGNOSIS — W57XXXS Bitten or stung by nonvenomous insect and other nonvenomous arthropods, sequela: Secondary | ICD-10-CM

## 2021-08-15 MED ORDER — PREDNISONE 20 MG PO TABS: ORAL_TABLET | ORAL | 0 refills | Status: DC

## 2021-08-18 ENCOUNTER — Encounter: Payer: Self-pay | Admitting: Adult Health

## 2021-08-19 ENCOUNTER — Other Ambulatory Visit: Payer: Self-pay | Admitting: Adult Health

## 2021-08-19 DIAGNOSIS — R296 Repeated falls: Secondary | ICD-10-CM

## 2021-08-19 DIAGNOSIS — R42 Dizziness and giddiness: Secondary | ICD-10-CM

## 2021-08-19 DIAGNOSIS — R2681 Unsteadiness on feet: Secondary | ICD-10-CM

## 2021-08-20 ENCOUNTER — Ambulatory Visit (INDEPENDENT_AMBULATORY_CARE_PROVIDER_SITE_OTHER): Payer: Medicare PPO

## 2021-08-20 DIAGNOSIS — R2681 Unsteadiness on feet: Secondary | ICD-10-CM | POA: Diagnosis not present

## 2021-08-20 DIAGNOSIS — M545 Low back pain, unspecified: Secondary | ICD-10-CM | POA: Diagnosis not present

## 2021-08-20 DIAGNOSIS — W57XXXS Bitten or stung by nonvenomous insect and other nonvenomous arthropods, sequela: Secondary | ICD-10-CM

## 2021-08-20 DIAGNOSIS — M2550 Pain in unspecified joint: Secondary | ICD-10-CM | POA: Diagnosis not present

## 2021-08-20 DIAGNOSIS — Z9181 History of falling: Secondary | ICD-10-CM | POA: Diagnosis not present

## 2021-08-20 NOTE — Progress Notes (Signed)
The patient came in for blood work today. She did not voice any concerns today.  ?

## 2021-08-21 DIAGNOSIS — R2681 Unsteadiness on feet: Secondary | ICD-10-CM | POA: Diagnosis not present

## 2021-08-21 DIAGNOSIS — Z9181 History of falling: Secondary | ICD-10-CM | POA: Diagnosis not present

## 2021-08-21 DIAGNOSIS — M545 Low back pain, unspecified: Secondary | ICD-10-CM | POA: Diagnosis not present

## 2021-08-22 LAB — B. BURGDORFI ANTIBODIES BY WB

## 2021-08-25 ENCOUNTER — Other Ambulatory Visit: Payer: Self-pay | Admitting: Adult Health

## 2021-08-25 DIAGNOSIS — R296 Repeated falls: Secondary | ICD-10-CM

## 2021-08-25 DIAGNOSIS — R42 Dizziness and giddiness: Secondary | ICD-10-CM

## 2021-08-25 DIAGNOSIS — R2681 Unsteadiness on feet: Secondary | ICD-10-CM

## 2021-08-26 DIAGNOSIS — R2681 Unsteadiness on feet: Secondary | ICD-10-CM | POA: Diagnosis not present

## 2021-08-26 DIAGNOSIS — M545 Low back pain, unspecified: Secondary | ICD-10-CM | POA: Diagnosis not present

## 2021-08-26 DIAGNOSIS — Z9181 History of falling: Secondary | ICD-10-CM | POA: Diagnosis not present

## 2021-08-27 ENCOUNTER — Telehealth: Payer: Self-pay | Admitting: Adult Health

## 2021-08-27 ENCOUNTER — Observation Stay (HOSPITAL_COMMUNITY)
Admission: EM | Admit: 2021-08-27 | Discharge: 2021-08-28 | Disposition: A | Discharge status: Home Health | Payer: Medicare PPO | Attending: Family Medicine | Admitting: Family Medicine

## 2021-08-27 ENCOUNTER — Emergency Department (HOSPITAL_COMMUNITY): Payer: Medicare PPO

## 2021-08-27 ENCOUNTER — Other Ambulatory Visit: Payer: Self-pay | Admitting: Adult Health

## 2021-08-27 ENCOUNTER — Other Ambulatory Visit: Payer: Self-pay | Admitting: Internal Medicine

## 2021-08-27 ENCOUNTER — Other Ambulatory Visit: Payer: Self-pay

## 2021-08-27 ENCOUNTER — Ambulatory Visit
Admission: RE | Admit: 2021-08-27 | Discharge: 2021-08-27 | Disposition: A | Discharge status: Home / Self Care | Payer: Medicare PPO | Source: Ambulatory Visit | Attending: Adult Health | Admitting: Adult Health

## 2021-08-27 ENCOUNTER — Encounter (HOSPITAL_COMMUNITY): Payer: Self-pay | Admitting: Emergency Medicine

## 2021-08-27 DIAGNOSIS — Z79899 Other long term (current) drug therapy: Secondary | ICD-10-CM | POA: Insufficient documentation

## 2021-08-27 DIAGNOSIS — I10 Essential (primary) hypertension: Secondary | ICD-10-CM | POA: Diagnosis not present

## 2021-08-27 DIAGNOSIS — C719 Malignant neoplasm of brain, unspecified: Secondary | ICD-10-CM | POA: Insufficient documentation

## 2021-08-27 DIAGNOSIS — R29810 Facial weakness: Secondary | ICD-10-CM | POA: Insufficient documentation

## 2021-08-27 DIAGNOSIS — R609 Edema, unspecified: Secondary | ICD-10-CM | POA: Diagnosis not present

## 2021-08-27 DIAGNOSIS — Z20822 Contact with and (suspected) exposure to covid-19: Secondary | ICD-10-CM | POA: Diagnosis not present

## 2021-08-27 DIAGNOSIS — Z9181 History of falling: Secondary | ICD-10-CM | POA: Insufficient documentation

## 2021-08-27 DIAGNOSIS — R42 Dizziness and giddiness: Secondary | ICD-10-CM | POA: Insufficient documentation

## 2021-08-27 DIAGNOSIS — M25551 Pain in right hip: Secondary | ICD-10-CM | POA: Insufficient documentation

## 2021-08-27 DIAGNOSIS — Z8616 Personal history of COVID-19: Secondary | ICD-10-CM | POA: Insufficient documentation

## 2021-08-27 DIAGNOSIS — C8339 Primary central nervous system lymphoma: Secondary | ICD-10-CM | POA: Diagnosis present

## 2021-08-27 DIAGNOSIS — R001 Bradycardia, unspecified: Secondary | ICD-10-CM | POA: Insufficient documentation

## 2021-08-27 DIAGNOSIS — R482 Apraxia: Secondary | ICD-10-CM | POA: Insufficient documentation

## 2021-08-27 DIAGNOSIS — Z96621 Presence of right artificial elbow joint: Secondary | ICD-10-CM | POA: Insufficient documentation

## 2021-08-27 DIAGNOSIS — R531 Weakness: Secondary | ICD-10-CM | POA: Insufficient documentation

## 2021-08-27 DIAGNOSIS — Z7982 Long term (current) use of aspirin: Secondary | ICD-10-CM | POA: Insufficient documentation

## 2021-08-27 DIAGNOSIS — K219 Gastro-esophageal reflux disease without esophagitis: Secondary | ICD-10-CM | POA: Diagnosis not present

## 2021-08-27 DIAGNOSIS — Z820 Family history of epilepsy and other diseases of the nervous system: Secondary | ICD-10-CM | POA: Insufficient documentation

## 2021-08-27 DIAGNOSIS — I440 Atrioventricular block, first degree: Secondary | ICD-10-CM | POA: Diagnosis not present

## 2021-08-27 DIAGNOSIS — R1310 Dysphagia, unspecified: Secondary | ICD-10-CM | POA: Insufficient documentation

## 2021-08-27 DIAGNOSIS — M545 Low back pain, unspecified: Secondary | ICD-10-CM | POA: Insufficient documentation

## 2021-08-27 DIAGNOSIS — G9389 Other specified disorders of brain: Secondary | ICD-10-CM | POA: Insufficient documentation

## 2021-08-27 DIAGNOSIS — Z8249 Family history of ischemic heart disease and other diseases of the circulatory system: Secondary | ICD-10-CM | POA: Insufficient documentation

## 2021-08-27 DIAGNOSIS — R2681 Unsteadiness on feet: Secondary | ICD-10-CM | POA: Insufficient documentation

## 2021-08-27 DIAGNOSIS — R220 Localized swelling, mass and lump, head: Secondary | ICD-10-CM | POA: Diagnosis not present

## 2021-08-27 DIAGNOSIS — D496 Neoplasm of unspecified behavior of brain: Secondary | ICD-10-CM | POA: Diagnosis not present

## 2021-08-27 DIAGNOSIS — R519 Headache, unspecified: Secondary | ICD-10-CM | POA: Diagnosis not present

## 2021-08-27 DIAGNOSIS — Z66 Do not resuscitate: Secondary | ICD-10-CM | POA: Insufficient documentation

## 2021-08-27 DIAGNOSIS — G936 Cerebral edema: Secondary | ICD-10-CM | POA: Diagnosis not present

## 2021-08-27 DIAGNOSIS — G939 Disorder of brain, unspecified: Secondary | ICD-10-CM | POA: Insufficient documentation

## 2021-08-27 DIAGNOSIS — E039 Hypothyroidism, unspecified: Secondary | ICD-10-CM | POA: Diagnosis not present

## 2021-08-27 DIAGNOSIS — R2689 Other abnormalities of gait and mobility: Secondary | ICD-10-CM | POA: Insufficient documentation

## 2021-08-27 DIAGNOSIS — Z87891 Personal history of nicotine dependence: Secondary | ICD-10-CM | POA: Insufficient documentation

## 2021-08-27 DIAGNOSIS — D499 Neoplasm of unspecified behavior of unspecified site: Secondary | ICD-10-CM

## 2021-08-27 DIAGNOSIS — Z515 Encounter for palliative care: Secondary | ICD-10-CM | POA: Insufficient documentation

## 2021-08-27 DIAGNOSIS — R296 Repeated falls: Secondary | ICD-10-CM

## 2021-08-27 DIAGNOSIS — I129 Hypertensive chronic kidney disease with stage 1 through stage 4 chronic kidney disease, or unspecified chronic kidney disease: Secondary | ICD-10-CM | POA: Diagnosis not present

## 2021-08-27 DIAGNOSIS — Z79890 Hormone replacement therapy (postmenopausal): Secondary | ICD-10-CM | POA: Insufficient documentation

## 2021-08-27 DIAGNOSIS — N182 Chronic kidney disease, stage 2 (mild): Secondary | ICD-10-CM | POA: Diagnosis not present

## 2021-08-27 DIAGNOSIS — N63 Unspecified lump in unspecified breast: Secondary | ICD-10-CM | POA: Diagnosis not present

## 2021-08-27 DIAGNOSIS — G935 Compression of brain: Secondary | ICD-10-CM | POA: Insufficient documentation

## 2021-08-27 DIAGNOSIS — C8589 Other specified types of non-Hodgkin lymphoma, extranodal and solid organ sites: Secondary | ICD-10-CM | POA: Diagnosis present

## 2021-08-27 DIAGNOSIS — E785 Hyperlipidemia, unspecified: Secondary | ICD-10-CM | POA: Diagnosis not present

## 2021-08-27 DIAGNOSIS — R948 Abnormal results of function studies of other organs and systems: Secondary | ICD-10-CM | POA: Insufficient documentation

## 2021-08-27 LAB — COMPREHENSIVE METABOLIC PANEL
ALT: 28 U/L (ref 0–44)
AST: 38 U/L (ref 15–41)
Albumin: 3.9 g/dL (ref 3.5–5.0)
Alkaline Phosphatase: 43 U/L (ref 38–126)
Anion gap: 10 (ref 5–15)
BUN: 21 mg/dL (ref 8–23)
CO2: 28 mmol/L (ref 22–32)
Calcium: 9.7 mg/dL (ref 8.9–10.3)
Chloride: 101 mmol/L (ref 98–111)
Creatinine, Ser: 0.79 mg/dL (ref 0.44–1.00)
GFR, Estimated: >60 mL/min (ref >60)
Glucose, Bld: 105 mg/dL — ABNORMAL HIGH (ref 70–99)
Potassium: 4.2 mmol/L (ref 3.5–5.1)
Sodium: 139 mmol/L (ref 135–145)
Total Bilirubin: 1.2 mg/dL (ref 0.3–1.2)
Total Protein: 7.1 g/dL (ref 6.5–8.1)

## 2021-08-27 LAB — I-STAT CHEM 8, ED
BUN: 23 mg/dL (ref 8–23)
Calcium, Ion: 1.15 mmol/L (ref 1.15–1.40)
Chloride: 104 mmol/L (ref 98–111)
Creatinine, Ser: 0.8 mg/dL (ref 0.44–1.00)
Glucose, Bld: 103 mg/dL — ABNORMAL HIGH (ref 70–99)
HCT: 41 % (ref 36.0–46.0)
Hemoglobin: 13.9 g/dL (ref 12.0–15.0)
Potassium: 4.2 mmol/L (ref 3.5–5.1)
Sodium: 138 mmol/L (ref 135–145)
TCO2: 28 mmol/L (ref 22–32)

## 2021-08-27 LAB — URINALYSIS, ROUTINE W REFLEX MICROSCOPIC
Bilirubin Urine: NEGATIVE
Glucose, UA: NEGATIVE mg/dL
Hgb urine dipstick: NEGATIVE
Ketones, ur: NEGATIVE mg/dL
Leukocytes,Ua: NEGATIVE
Nitrite: NEGATIVE
Protein, ur: NEGATIVE mg/dL
Specific Gravity, Urine: 1.01 (ref 1.005–1.030)
pH: 8 (ref 5.0–8.0)

## 2021-08-27 LAB — CBC
HCT: 41 % (ref 36.0–46.0)
Hemoglobin: 13.6 g/dL (ref 12.0–15.0)
MCH: 31.4 pg (ref 26.0–34.0)
MCHC: 33.2 g/dL (ref 30.0–36.0)
MCV: 94.7 fL (ref 80.0–100.0)
Platelets: 189 10*3/uL (ref 150–400)
RBC: 4.33 MIL/uL (ref 3.87–5.11)
RDW: 14.2 % (ref 11.5–15.5)
WBC: 8.3 10*3/uL (ref 4.0–10.5)
nRBC: 0 % (ref 0.0–0.2)

## 2021-08-27 LAB — ETHANOL: Alcohol, Ethyl (B): <10 mg/dL (ref <10)

## 2021-08-27 LAB — DIFFERENTIAL
Abs Immature Granulocytes: 0.04 10*3/uL (ref 0.00–0.07)
Basophils Absolute: 0.1 10*3/uL (ref 0.0–0.1)
Basophils Relative: 1 %
Eosinophils Absolute: 0.2 10*3/uL (ref 0.0–0.5)
Eosinophils Relative: 2 %
Immature Granulocytes: 1 %
Lymphocytes Relative: 18 %
Lymphs Abs: 1.5 10*3/uL (ref 0.7–4.0)
Monocytes Absolute: 0.8 10*3/uL (ref 0.1–1.0)
Monocytes Relative: 9 %
Neutro Abs: 5.8 10*3/uL (ref 1.7–7.7)
Neutrophils Relative %: 69 %

## 2021-08-27 LAB — RESP PANEL BY RT-PCR (FLU A&B, COVID) ARPGX2
Influenza A by PCR: NEGATIVE
Influenza B by PCR: NEGATIVE
SARS Coronavirus 2 by RT PCR: NEGATIVE

## 2021-08-27 LAB — RAPID URINE DRUG SCREEN, HOSP PERFORMED
Amphetamines: NOT DETECTED
Barbiturates: NOT DETECTED
Benzodiazepines: NOT DETECTED
Cocaine: NOT DETECTED
Opiates: NOT DETECTED
Tetrahydrocannabinol: NOT DETECTED

## 2021-08-27 LAB — PROTIME-INR
INR: 1.1 (ref 0.8–1.2)
Prothrombin Time: 14.2 seconds (ref 11.4–15.2)

## 2021-08-27 LAB — APTT: aPTT: 25 seconds (ref 24–36)

## 2021-08-27 MED ORDER — IOHEXOL 9 MG/ML PO SOLN
ORAL | Status: AC
  Filled 2021-08-27: qty 1000

## 2021-08-27 MED ORDER — ENOXAPARIN SODIUM 40 MG/0.4ML IJ SOSY
40.0000 mg | PREFILLED_SYRINGE | INTRAMUSCULAR | Status: DC
  Administered 2021-08-27: 40 mg via SUBCUTANEOUS
  Filled 2021-08-27: qty 0.4

## 2021-08-27 MED ORDER — POLYETHYLENE GLYCOL 3350 17 G PO PACK: 17.0000 g | PACK | Freq: Every day | ORAL | Status: DC | PRN

## 2021-08-27 MED ORDER — DEXAMETHASONE SODIUM PHOSPHATE 4 MG/ML IJ SOLN
4.0000 mg | Freq: Four times a day (QID) | INTRAMUSCULAR | Status: DC
  Administered 2021-08-27 – 2021-08-28 (×3): 4 mg via INTRAVENOUS
  Filled 2021-08-27 (×4): qty 1

## 2021-08-27 MED ORDER — IRBESARTAN 150 MG PO TABS
150.0000 mg | ORAL_TABLET | Freq: Every day | ORAL | Status: DC
  Administered 2021-08-27 – 2021-08-28 (×2): 150 mg via ORAL
  Filled 2021-08-27 (×2): qty 1

## 2021-08-27 MED ORDER — TRETINOIN 0.05 % EX CREA: 1.0000 "application " | TOPICAL_CREAM | CUTANEOUS | Status: DC

## 2021-08-27 MED ORDER — KETOROLAC TROMETHAMINE 15 MG/ML IJ SOLN
7.5000 mg | Freq: Four times a day (QID) | INTRAMUSCULAR | Status: DC | PRN
  Administered 2021-08-27 – 2021-08-28 (×2): 7.5 mg via INTRAVENOUS
  Filled 2021-08-27 (×2): qty 1

## 2021-08-27 MED ORDER — DEXAMETHASONE SODIUM PHOSPHATE 10 MG/ML IJ SOLN
10.0000 mg | Freq: Once | INTRAMUSCULAR | Status: AC
  Administered 2021-08-27: 10 mg via INTRAVENOUS
  Filled 2021-08-27: qty 1

## 2021-08-27 MED ORDER — ACETAMINOPHEN 325 MG PO TABS
650.0000 mg | ORAL_TABLET | Freq: Four times a day (QID) | ORAL | Status: DC | PRN
  Administered 2021-08-28 (×2): 650 mg via ORAL
  Filled 2021-08-27 (×2): qty 2

## 2021-08-27 MED ORDER — ACETAMINOPHEN 650 MG RE SUPP: 650.0000 mg | Freq: Four times a day (QID) | RECTAL | Status: DC | PRN

## 2021-08-27 MED ORDER — LIOTHYRONINE SODIUM 5 MCG PO TABS
10.0000 ug | ORAL_TABLET | Freq: Two times a day (BID) | ORAL | Status: DC
  Administered 2021-08-28 (×2): 10 ug via ORAL
  Filled 2021-08-27 (×3): qty 2

## 2021-08-27 MED ORDER — SODIUM CHLORIDE 0.9% FLUSH
3.0000 mL | Freq: Two times a day (BID) | INTRAVENOUS | Status: DC
  Administered 2021-08-27 – 2021-08-28 (×2): 3 mL via INTRAVENOUS

## 2021-08-27 MED ORDER — GADOBUTROL 1 MMOL/ML IV SOLN
7.0000 mL | Freq: Once | INTRAVENOUS | Status: AC | PRN
  Administered 2021-08-27: 7 mL via INTRAVENOUS

## 2021-08-27 NOTE — Telephone Encounter (Signed)
Spoke with charge RN at [facility] ED advising impending arrival of patient with CT necessitating STAT MRI with contrast; she advised 3 hour wait, I placed order for STAT MRI with contrast to be completed at [facility] ED as was recommended by radiologist for edema/possible R frontal mass with 8 mm mass effect. Patient was confirmed as arrived in ED waiting room.  ?

## 2021-08-27 NOTE — Consult Note (Signed)
Reason for Consult: Brain mass ?Referring Physician: EDP ? ?Daisy Oliver is an 86 y.o. female.  ? ?HPI:  ?86 year old female who presents today for further evaluation and management of a right-sided brain mass seen on CT scan of the head done as an outpatient today.  She has been having trouble with balance and with episodes of left-sided weakness and difficulty with use of her hand this started in January.  Her friends have noticed that she has trouble sending emails because her left hand does not work as well.  She has fallen a couple times because of imbalance on the left.  She has had a little trouble with speech.  She saw her primary care physician regarding this and they did a CT scan of the brain today and this showed a large right-sided lesion with edema and she was sent to the emergency department for an MRI and consultation.  She denies headaches.  She denies seizure.  He has had a suspicious breast lesion recently.  She was once a heavy smoker for about 20 years.  She has also had low back pain with radiation of the right leg that got better with a 6-day course of prednisone. ? ?Past Medical History:  ?Diagnosis Date  ? 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 diagnosed at age 47  ? COVID-19 08/26/2020  ? Fracture of radial head, right, closed 09/16/2012  ? GERD (gastroesophageal reflux disease)   ? pepcid prn  ? Hypertension   ? Hypothyroidism   ?  ?Past Surgical History:  ?Procedure Laterality Date  ? CATARACT EXTRACTION, BILATERAL  2020  ? Dr. Herbert Deaner  ? RADIAL HEAD ARTHROPLASTY Right 09/16/2012  ? Procedure: RADIAL HEAD ARTHROPLASTY;  Surgeon: Johnny Bridge, MD;  Location: [facility];  Service: Orthopedics;  Laterality: Right;  RADIAL HEAD REPLACEMENT  ?  ?  ?Allergies  ?Allergen Reactions  ? Barbiturates   ? Pentothal [Thiopental]   ? Sulfa Antibiotics   ? Wheat Bran Nausea And Vomiting  ?  ?Social History  ? ?Tobacco Use  ? Smoking status: Former  ?  Packs/day: 2.00  ?  Years: 22.00   ?  Pack years: 44.00  ?  Types: Cigarettes  ?  Quit date: 09/17/1983  ?  Years since quitting: 37.9  ? Smokeless tobacco: Never  ?Substance Use Topics  ? Alcohol use: Not Currently  ?  Alcohol/week: 0.0 - 1.0 standard drinks  ?  Comment: 2/month  ?  ?Family History  ?Problem Relation Age of Onset  ? Heart disease Mother   ? Brain cancer Father 60  ?     Brain tumor, not cancerous  ? Hypertension Sister   ? Osteoporosis Sister   ? Arthritis Sister   ? Scoliosis Sister   ? Hypertension Sister   ? Osteoporosis Sister   ? Cancer Paternal Grandfather   ? Suicidality Paternal Grandfather   ? Breast cancer Neg Hx   ? ?  ?Review of Systems ? ?Positive ROS: As above, plus recent back pain radiating down the right leg that got better with steroids. ? ?All other systems have been reviewed and were otherwise negative with the exception of those mentioned in the HPI and as above. ? ?Objective: ?Vital signs in last 24 hours: ?Temp:  [97.9 ?F (36.6 ?C)] 97.9 ?F (36.6 ?C) (05/17 1245) ?Pulse Rate:  [56-90] 90 (05/17 1553) ?Resp:  [15-20] 15 (05/17 1630) ?BP: (143-162)/(58-76) 153/58 (05/17 1630) ?SpO2:  [99 %] 99 % (05/17 1553) ?  Weight:  [69.9 kg] 69.9 kg (05/17 1243) ? ?General Appearance: Alert, cooperative, no distress, appears stated age ?Head: Normocephalic, without obvious abnormality, atraumatic ?Eyes: PERRL, conjunctiva/corneas clear, EOM's intact     ?Throat: benign ?Neck: Supple, symmetrical, trachea midline ?Lungs: Clear to auscultation bilaterally, respirations unlabored ?Heart: Regular rate and rhythm ?Abdomen: Soft ?Extremities: Extremities normal, atraumatic, no cyanosis or edema ?Pulses: 2+ and symmetric all extremities ?Skin: Skin color, texture, turgor normal, no rashes or lesions ? ?NEUROLOGIC:  ? ?Mental status: A&O x4, no aphasia, good attention span, Memory and fund of knowledge appear to be appropriate ?Motor Exam - grossly normal, normal tone and bulk on the right but she has weakness and ataxia of the left  upper extremity ?Sensory Exam - grossly normal ?Reflexes: symmetric, no pathologic reflexes, No Hoffman's, No clonus ?Coordination -poor in the left upper extremity ?Gait -slight limp on the left ?Balance -not tested ?Cranial Nerves: ?I: smell Not tested  ?II: visual acuity  OS: na    OD: na  ?II: visual fields Full to confrontation  ?II: pupils Equal, round, reactive to light  ?III,VII: ptosis None  ?III,IV,VI: extraocular muscles  Full ROM  ?V: mastication Normal  ?V: facial light touch sensation  Normal  ?V,VII: corneal reflex  Present  ?VII: facial muscle function - upper  Normal  ?VII: facial muscle function - lower Left lower facial droop  ?VIII: hearing Not tested  ?IX: soft palate elevation  Normal  ?IX,X: gag reflex Present  ?XI: trapezius strength  5/5  ?XI: sternocleidomastoid strength 5/5  ?XI: neck flexion strength  5/5  ?XII: tongue strength  Normal  ? ? ?Data Review ?Lab Results  ?Component Value Date  ? WBC 8.3 08/27/2021  ? HGB 13.9 08/27/2021  ? HCT 41.0 08/27/2021  ? MCV 94.7 08/27/2021  ? PLT 189 08/27/2021  ? ?Lab Results  ?Component Value Date  ? NA 138 08/27/2021  ? K 4.2 08/27/2021  ? CL 104 08/27/2021  ? CO2 28 08/27/2021  ? BUN 23 08/27/2021  ? CREATININE 0.80 08/27/2021  ? GLUCOSE 103 (H) 08/27/2021  ? ?Lab Results  ?Component Value Date  ? INR 1.1 08/27/2021  ? ? ?Radiology: CT HEAD WO CONTRAST (5MM) ? ?Addendum Date: 08/27/2021   ?ADDENDUM REPORT: 08/27/2021 11:51 ADDENDUM: Findings discussed with Liane Comber, NP via telephone at 11:42 a.m. Additionally, I spoke with the technologist Marzetta Board and the patient will be transported to the emergency department for management. Electronically Signed   By: Margaretha Sheffield M.D.   On: 08/27/2021 11:51  ? ?Result Date: 08/27/2021 ?CLINICAL DATA:  Dizziness, non-specific Dizziness, non-specific; progressive unsteady gait, atypical frequent falls, difficulty focusing, now with new headaches, progresive 8 weeks, r/o NPH EXAM: CT HEAD WITHOUT CONTRAST  TECHNIQUE: Contiguous axial images were obtained from the base of the skull through the vertex without intravenous contrast. RADIATION DOSE REDUCTION: This exam was performed according to the departmental dose-optimization program which includes automated exposure control, adjustment of the mA and/or kV according to patient size and/or use of iterative reconstruction technique. COMPARISON:  None Available. FINDINGS: Brain: Extensive vasogenic edema centered in the right frontal lobe but also involving the anterior parietal lobe and temporal lobes. Probable mass lesion in the right basal ganglia region and overlying frontal lobe. Resulting mass effect with effacement the right lateral ventricle and approximately 8 mm of leftward midline shift. No evidence of hydrocephalus. No clear evidence of acute hemorrhage. Basal cisterns are patent. Vascular: No hyperdense vessel identified. Calcific intracranial atherosclerosis Skull:  No acute fracture. Sinuses/Orbits: Clear sinuses.  No acute orbital findings. Other: No mastoid effusions. IMPRESSION: Extensive vasogenic edema in the right frontal, parietal and temporal lobes with probable mass in the right basal ganglia and overlying frontal lobe, concerning for malignancy but not well evaluated. Resulting mass effect with approximately 8 mm of leftward midline shift and effacement the right lateral ventricle. Recommend MRI with contrast to further evaluate. Also, recommend urgent neurosurgical consultation. Electronically Signed: By: Margaretha Sheffield M.D. On: 08/27/2021 11:35  ? ?MR BRAIN W WO CONTRAST ? ?Result Date: 08/27/2021 ?CLINICAL DATA:  Brain mass.  Abnormal CT today. EXAM: MRI HEAD WITHOUT AND WITH CONTRAST TECHNIQUE: Multiplanar, multiecho pulse sequences of the brain and surrounding structures were obtained without and with intravenous contrast. CONTRAST:  64m GADAVIST GADOBUTROL 1 MMOL/ML IV SOLN COMPARISON:  CT head 08/27/2021.  MRI head 02/01/2007 FINDINGS:  Brain: Large infiltrating mass in the right frontal and temporal lobe. There are areas of edema within the mass and areas of enhancement within the mass. The areas of enhancement show restricted diffusio

## 2021-08-27 NOTE — Telephone Encounter (Signed)
Received STAT CT results from radiologist Dr. Nicki Reaper at [facility] imaging communicating R frontal/parietal and temporal lobe edema, possible mass, mass effect with 8 mm left midline shift recommended for STAT MRI/surgical consult by ED. They are holding her at imaging site. I called her husband and communicated emergent CT results requiring STAT MRI and ED evaluation. He states will immediately pick her up and transport to [facility].  ? ? ? ? ? ? ? ? ? ?

## 2021-08-27 NOTE — ED Provider Triage Note (Signed)
Emergency Medicine Provider Triage Evaluation Note  Daisy Oliver , a 86 y.o. female  was evaluated in triage.  Pt complains of weakness and instability worsening since January she states that has been progressively worsening since that time.  Seems that she has had some low-grade falls.  And some weakness.   Review of Systems  Positive: weakness Negative: fever  Physical Exam  BP (!) 162/76   Pulse (!) 56   Temp 97.9 F (36.6 C) (Oral)   Resp 18   Ht '5\' 4"'$  (1.626 m)   Wt 69.9 kg   SpO2 99%   BMI 26.43 kg/m  Gen:   Awake, no distress  Resp:  Normal effort  MSK:   Moves extremities without difficulty  Other:  Left arm weakness, grip weakness, L weakness  Medical Decision Making  Medically screening exam initiated at 12:46 PM.  Appropriate orders placed.  Daisy Oliver was informed that the remainder of the evaluation will be completed by another provider, this initial triage assessment does not replace that evaluation, and the importance of remaining in the ED until their evaluation is complete.  Consulted neurosurg for guidance - MRI brain and labs placed. EKG   Daisy Oliver, [place] 08/27/21 1249

## 2021-08-27 NOTE — ED Notes (Signed)
Patient transported to MRI 

## 2021-08-27 NOTE — ED Triage Notes (Signed)
Patient sent from PCP after abnormal CT scan of head today. Having left sided weakness and difficulty swallowing since January. Patient A&0x4. C/o back and hip pain.  ?

## 2021-08-27 NOTE — ED Provider Notes (Signed)
?[facility] ?Provider Note ? ? ?CSN: [id number] ?Arrival date & time: 08/27/21  1209 ? ?  ? ?History ? ?Chief Complaint  ?Patient presents with  ? Weakness  ? ? ?Daisy Oliver is a 86 y.o. female with a past medical history of hypertension, hyperlipidemia, hypothyroidism and stage II chronic kidney disease presenting today at the request of her PCP due to a mass on CT scan.  She reports that over the past 6 weeks she has been experiencing increasing difficulty with typing and writing with her left hand.  Her family also reports that she has been slurring her words and using the incorrect word and sentences.  Her husband also states that she gets confused and forgets daily plans.  Family reports that in January she had a near syncopal episode and patient reports that she got very dizzy at that time.  She has had dizzy spells and an apparent decline ever since.  She reports 3 falls in the past 2 weeks due to feeling unsteady on her feet.  Earlier today patient had an outpatient CT scheduled by her PCP.  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  They were instructed to come to the ED for an urgent MRI. ? ? ?Weakness ? ?  ? ?Home Medications ?Prior to Admission medications   ?Medication Sig Start Date End Date Taking? Authorizing Provider  ?BIOTIN PO Take by mouth daily.    [provider]  ?Calcium-Magnesium-Vitamin D (CALCIUM 1200+D3 PO) Take by mouth.    [provider]  ?clotrimazole-betamethasone (LOTRISONE) cream Apply 1 application topically 2 (two) times daily. 09/14/17   Liane Comber, NP  ?Cyanocobalamin (VITAMIN B 12 PO) Take by mouth daily.    [provider]  ?Digestive Enzymes (BETAINE HCL PO) Take by mouth. Ortho Biotic    [provider]  ?Ergocalciferol (VITAMIN D2) 2000 UNITS TABS Take by mouth.    [provider]  ?erythromycin with ethanol (THERAMYCIN) 2 % external solution Apply topically daily. 09/23/18    Vladimir Crofts, PA-C  ?fexofenadine (ALLEGRA) 180 MG tablet Take 1 tablet (180 mg total) by mouth daily. 07/23/21   Liane Comber, NP  ?L-Methylfolate-Algae-B12-B6 (METANX PO) Take by mouth daily.    [provider]  ?liothyronine (CYTOMEL) 5 MCG tablet Take 2 tablets (10 mcg total) by mouth 2 (two) times daily. 10/21/20   Liane Comber, NP  ?Magnesium 500 MG CAPS Take by mouth.    [provider]  ?meclizine (ANTIVERT) 25 MG tablet 1/2-1 pill up to 3 times daily for motion sickness/dizziness 07/22/21   Liane Comber, NP  ?Misc Natural Products (OSTEO BI-FLEX JOINT SHIELD PO) Take by mouth. Osteo Bi-Flex    [provider]  ?NEOMYCIN-POLYMYXIN-HYDROCORTISONE (CORTISPORIN) 1 % SOLN OTIC solution Place 6 drops into both ears 4 (four) times daily. 09/23/18   Vladimir Crofts, PA-C  ?olmesartan (BENICAR) 20 MG tablet Take 1/2 tablet  Daily  for BP 08/13/21   Liane Comber, NP  ?Omega-3 Fatty Acids (FISH OIL PO) Take by mouth daily. Fish Oil    [provider]  ?OVER THE COUNTER MEDICATION daily. P 5 P50    [provider]  ?[place]     [provider]  ?predniSONE (DELTASONE) 20 MG tablet 2 tablets daily for 3 days, 1 tablet daily for 4 days. 08/15/21   Liane Comber, NP  ?Taurine 1000 MG CAPS Take by mouth.    [provider]  ?  tretinoin (RETIN-A) 0.05 % cream Apply topically at bedtime. 03/17/18   Vladimir Crofts, PA-C  ?Zinc 50 MG CAPS Take by mouth.    [provider]  ?   ? ?Allergies    ?Barbiturates, Pentothal [thiopental], Sulfa antibiotics, and Wheat bran   ? ?Review of Systems   ?Review of Systems  ?Neurological:  Positive for weakness.  ? ?Physical Exam ?Updated Vital Signs ?BP (!) 159/60   Pulse 90   Temp 97.9 ?F (36.6 ?C) (Oral)   Resp 20   Ht '5\' 4"'$  (1.626 m)   Wt 69.9 kg   SpO2 99%   BMI 26.43 kg/m?  ?Physical Exam ?Vitals and nursing note reviewed.  ?Constitutional:   ?   Appearance: Normal  appearance.  ?HENT:  ?   Head: Normocephalic and atraumatic.  ?   Mouth/Throat:  ?   Comments: Left-sided mouth droop ?Eyes:  ?   General: No scleral icterus. ?   Extraocular Movements: Extraocular movements intact.  ?   Conjunctiva/sclera: Conjunctivae normal.  ?   Pupils: Pupils are equal, round, and reactive to light.  ?Cardiovascular:  ?   Rate and Rhythm: Regular rhythm. Bradycardia present.  ?Pulmonary:  ?   Effort: Pulmonary effort is normal. No respiratory distress.  ?   Breath sounds: No wheezing.  ?Abdominal:  ?   General: Abdomen is flat.  ?   Palpations: Abdomen is soft.  ?Skin: ?   General: Skin is warm and dry.  ?   Findings: No rash.  ?Neurological:  ?   General: No focal deficit present.  ?   Mental Status: She is alert and oriented to person, place, and time. Mental status is at baseline.  ?   Cranial Nerves: No cranial nerve deficit.  ?   Sensory: Sensory deficit (Reports decreased sensation in bilateral plantar surfaces) present.  ?   Motor: Weakness (4/5 strength with left sided finger grip and elbow extension.) present.  ?   Coordination: Coordination normal.  ?   Comments: Cranial nerves II through XII grossly intact.  Patient alert and oriented x3.  No problem with finger-nose.  No slurred speech  ?Psychiatric:     ?   Mood and Affect: Mood normal.  ? ? ?ED Results / Procedures / Treatments   ?Labs ?(all labs ordered are listed, but only abnormal results are displayed) ?Labs Reviewed  ?COMPREHENSIVE METABOLIC PANEL - Abnormal; Notable for the following components:  ?    Result Value  ? Glucose, Bld 105 (*)   ? All other components within normal limits  ?I-STAT CHEM 8, ED - Abnormal; Notable for the following components:  ? Glucose, Bld 103 (*)   ? All other components within normal limits  ?RESP PANEL BY RT-PCR (FLU A&B, COVID) ARPGX2  ?ETHANOL  ?PROTIME-INR  ?APTT  ?CBC  ?DIFFERENTIAL  ?RAPID URINE DRUG SCREEN, HOSP PERFORMED  ?URINALYSIS, ROUTINE W REFLEX MICROSCOPIC  ? ? ?EKG ?EKG  Interpretation ? ?Date/Time:  Wednesday Aug 27 2021 12:46:57 EDT ?Ventricular Rate:  59 ?PR Interval:  222 ?QRS Duration: 82 ?QT Interval:  424 ?QTC Calculation: 419 ?R Axis:   49 ?Text Interpretation: Sinus bradycardia with 1st degree A-V block Non-specific ST-t changes Confirmed by Lajean Saver [phone]) on 08/27/2021 4:28:44 PM ? ?Radiology ?CT HEAD WO CONTRAST (5MM) ? ?Addendum Date: 08/27/2021   ?ADDENDUM REPORT: 08/27/2021 11:51 ADDENDUM: Findings discussed with Liane Comber, NP via telephone at 11:42 a.m. Additionally, I spoke with the technologist Marzetta Board and the patient will  be transported to the emergency department for management. Electronically Signed   By: Margaretha Sheffield M.D.   On: 08/27/2021 11:51  ? ?Result Date: 08/27/2021 ?CLINICAL DATA:  Dizziness, non-specific Dizziness, non-specific; progressive unsteady gait, atypical frequent falls, difficulty focusing, now with new headaches, progresive 8 weeks, r/o NPH EXAM: CT HEAD WITHOUT CONTRAST TECHNIQUE: Contiguous axial images were obtained from the base of the skull through the vertex without intravenous contrast. RADIATION DOSE REDUCTION: This exam was performed according to the departmental dose-optimization program which includes automated exposure control, adjustment of the mA and/or kV according to patient size and/or use of iterative reconstruction technique. COMPARISON:  None Available. FINDINGS: Brain: Extensive vasogenic edema centered in the right frontal lobe but also involving the anterior parietal lobe and temporal lobes. Probable mass lesion in the right basal ganglia region and overlying frontal lobe. Resulting mass effect with effacement the right lateral ventricle and approximately 8 mm of leftward midline shift. No evidence of hydrocephalus. No clear evidence of acute hemorrhage. Basal cisterns are patent. Vascular: No hyperdense vessel identified. Calcific intracranial atherosclerosis Skull: No acute fracture. Sinuses/Orbits: Clear  sinuses.  No acute orbital findings. Other: No mastoid effusions. IMPRESSION: Extensive vasogenic edema in the right frontal, parietal and temporal lobes with probable mass in the right basal ganglia and overlying frontal lobe, c

## 2021-08-27 NOTE — Telephone Encounter (Signed)
Pt's daughter is leaving the country soon and she says she has medical POA and is very concerned about her mom. That she knows the lyme disease test came back negative but is wanting to speak to Caryl Pina about what she has observed about her mom's recent decline. She lives 5 hours away so is not able to come in with her for an appt, but they are pretty much wanting to know if they need to start looking into getting her around the clock care or not.  ?

## 2021-08-27 NOTE — ED Provider Notes (Signed)
Discussed w Dr. Ronnald Ramp - '10mg'$  IV decadron and re-consult neurosurg after MRI brain w/wo   Tedd Sias, PA 08/27/21 1305    Blanchie Dessert, MD 08/28/21 [phone]

## 2021-08-27 NOTE — ED Notes (Signed)
Pt back to the room from MRI ?

## 2021-08-27 NOTE — H&P (Addendum)
History and Physical   Daisy Oliver GNF:[id number] DOB: 1935/10/13 DOA: 08/27/2021  PCP: Lucky Cowboy, MD   Patient coming from: Home/PCP  Chief Complaint: Weakness, abnormal CT scan  HPI: Daisy Oliver is a 86 y.o. female with medical history significant of hypertension, hypothyroidism, hyperlipidemia, CKD 2, GERD presenting with abnormal CT scan outpatient.  Patient reporting history of weakness since January on the left side.  Also some issues with her balance and has had some falls.  Had outpatient CT done today after seeing her PCP which showed brain mass and edema with left word shift.  Sent to the ED for further evaluation.  Also reporting having a little trouble with her speech at times and difficulty typing with her left hand.  She denies fevers, chills, chest pain, shortness of breath, abdominal pain, constipation, diarrhea, nausea, vomit appearing  ED Course: Vital signs in the ED significant for heart rate in the 50s, blood pressure in the 150s to 160s systolic.  Lab work-up included CMP within normal limits.  CBC within normal limits, PT and INR within normal limits.  Respiratory panel for flu COVID-negative.  Ethanol level normal, UDS normal, urinalysis negative.  CT head outpatient showed vasogenic edema and right frontal, parietal, temporal lobe edema.  With suspected mass at the right basal ganglia and 8 mm left shift.  MRI of the brain showed large mass with multifocal areas of enhancing and nonenhancing edema with 8 mm left shift consistent with neoplasm.  Patient received dose of Decadron in the ED.  Neurosurgery was consulted and have discussed the poor prognosis with the patient and appears patient is leaning towards palliative measures.  It appears they will follow along they like to be notified if patient decides for pursue aggressive measures.  Review of Systems: As per HPI otherwise all other systems reviewed and are negative.  Past Medical History:   Diagnosis Date   Acute intermittent porphyria (HCC)    diagnosed at age 52   COVID-19 08/26/2020   Fracture of radial head, right, closed 09/16/2012   GERD (gastroesophageal reflux disease)    pepcid prn   Hypertension    Hypothyroidism     Past Surgical History:  Procedure Laterality Date   CATARACT EXTRACTION, BILATERAL  2020   Dr. Elmer Picker   RADIAL HEAD ARTHROPLASTY Right 09/16/2012   Procedure: RADIAL HEAD ARTHROPLASTY;  Surgeon: Eulas Post, MD;  Location: [facility];  Service: Orthopedics;  Laterality: Right;  RADIAL HEAD REPLACEMENT      Social History  reports that she quit smoking about 37 years ago. Her smoking use included cigarettes. She has a 44.00 pack-year smoking history. She has never used smokeless tobacco. She reports that she does not currently use alcohol. She reports that she does not use drugs.  Allergies  Allergen Reactions   Barbiturates Other (See Comments)    Reaction not cited   Other Other (See Comments)    Patient has AIP and is not to have hormones or red wine   Pentothal [Thiopental] Other (See Comments)    Reaction not cited   Sulfa Antibiotics Other (See Comments)    Reaction not cited   Wheat Bran Nausea And Vomiting    Family History  Problem Relation Age of Onset   Heart disease Mother    Brain cancer Father 55       Brain tumor, not cancerous   Hypertension Sister    Osteoporosis Sister    Arthritis Sister  Scoliosis Sister    Hypertension Sister    Osteoporosis Sister    Cancer Paternal Grandfather    Suicidality Paternal Grandfather    Breast cancer Neg Hx   Reviewed on admission  Prior to Admission medications   Medication Sig Start Date End Date Taking? Authorizing Provider  acetaminophen (TYLENOL) 325 MG tablet Take 650 mg by mouth every 6 (six) hours as needed (for pain).   Yes [provider]  aspirin EC 325 MG tablet Take 650 mg by mouth daily as needed (for pain).   Yes [provider]  BIOTIN PO Take 1 tablet by mouth daily.   Yes [provider]  Calcium-Magnesium-Vitamin D (CALCIUM 1200+D3 PO) Take 1 tablet by mouth daily.   Yes [provider]  Cyanocobalamin (B-12 PO) Place 1 tablet under the tongue in the morning.   Yes [provider]  Digestive Enzymes (BETAINE HCL PO) Take 1 capsule by mouth in the morning. Ortho Biotic   Yes [provider]  Ergocalciferol (VITAMIN D2) 2000 UNITS TABS Take 2,000 Units by mouth daily.   Yes [provider]  erythromycin with ethanol (THERAMYCIN) 2 % external solution Apply topically daily. Patient taking differently: Apply 1 application. topically daily as needed (to insect bites). 09/23/18  Yes Quentin Mulling R, PA-C  fluticasone (CUTIVATE) 0.05 % cream Apply 1 application. topically 2 (two) times daily as needed (for itching).   Yes [provider]  liothyronine (CYTOMEL) 5 MCG tablet Take 2 tablets (10 mcg total) by mouth 2 (two) times daily. Patient taking differently: Take 10 mcg by mouth See admin instructions. Take 10 mcg by mouth before breakfast and lunch 10/21/20  Yes Judd Gaudier, NP  Magnesium 500 MG CAPS Take 500 mg by mouth daily.   Yes [provider]  meclizine (ANTIVERT) 25 MG tablet 1/2-1 pill up to 3 times daily for motion sickness/dizziness Patient taking differently: Take 25 mg by mouth 3 (three) times daily as needed for dizziness. 07/22/21  Yes Judd Gaudier, NP  Misc Natural Products (OSTEO BI-FLEX JOINT SHIELD PO) Take 1 tablet by mouth at bedtime. Osteo Bi-Flex   Yes [provider]  naproxen sodium (ALEVE) 220 MG tablet Take 220-440 mg by mouth 2 (two) times daily as needed (for pain).   Yes [provider]  NEOMYCIN-POLYMYXIN-HYDROCORTISONE (CORTISPORIN) 1 % SOLN OTIC solution Place 6 drops into both ears 4 (four) times daily. Patient taking differently: Place 6 drops into the right ear daily as needed ("for pain"). 09/23/18   Yes Doree Albee, PA-C  NON FORMULARY Apply 1 application. topically See admin instructions. CBD cream- Apply topically as needed for pain relief   Yes [provider]  NON FORMULARY Take 1 capsule by mouth See admin instructions. Pure Encapsulations P5P 50  Vitamin B6 Supplement- Take 1 capsule by mouth once a day   Yes [provider]  olmesartan (BENICAR) 20 MG tablet Take 1/2 tablet  Daily  for BP Patient taking differently: Take 10 mg by mouth daily. 08/13/21  Yes Judd Gaudier, NP  Omega-3 Fatty Acids (FISH OIL PO) Take 1 capsule by mouth in the morning.   Yes [provider]  Taurine 1000 MG CAPS Take 1,000 mg by mouth daily.   Yes [provider]  tretinoin (RETIN-A) 0.05 % cream Apply topically at bedtime. Patient taking differently: Apply 1 application. topically every Monday, Wednesday, and Friday at 8 PM. 03/17/18  Yes Doree Albee, PA-C  Zinc 50 MG  CAPS Take 50 mg by mouth daily.   Yes [provider]  clotrimazole-betamethasone (LOTRISONE) cream Apply 1 application topically 2 (two) times daily. Patient not taking: Reported on 08/27/2021 09/14/17   Judd Gaudier, NP  fexofenadine (ALLEGRA) 180 MG tablet Take 1 tablet (180 mg total) by mouth daily. Patient not taking: Reported on 08/27/2021 07/23/21   Judd Gaudier, NP  L-Methylfolate-Algae-B12-B6 (METANX PO) Take by mouth daily.    [provider]    Physical Exam: Vitals:   08/27/21 1554 08/27/21 1600 08/27/21 1615 08/27/21 1630  BP:  (!) 157/63 (!) 143/62 (!) 153/58  Pulse:      Resp: 20 19 15 15   Temp:      TempSrc:      SpO2:      Weight:      Height:        Physical Exam Constitutional:      General: She is not in acute distress.    Appearance: Normal appearance.  HENT:     Head: Normocephalic and atraumatic.     Mouth/Throat:     Mouth: Mucous membranes are moist.     Pharynx: Oropharynx is clear.  Eyes:     Extraocular Movements: Extraocular  movements intact.     Pupils: Pupils are equal, round, and reactive to light.  Cardiovascular:     Rate and Rhythm: Normal rate and regular rhythm.     Pulses: Normal pulses.     Heart sounds: Normal heart sounds.  Pulmonary:     Effort: Pulmonary effort is normal. No respiratory distress.     Breath sounds: Normal breath sounds.  Abdominal:     General: Bowel sounds are normal. There is no distension.     Palpations: Abdomen is soft.     Tenderness: There is no abdominal tenderness.  Musculoskeletal:        General: No swelling or deformity.  Skin:    General: Skin is warm and dry.  Neurological:     Comments: Mental Status: Patient is awake, alert, oriented x3 No signs of aphasia or neglect Cranial Nerves: II: Pupils equal, round, and reactive to light.   III,IV, VI: EOMI without ptosis or diploplia.  V: Facial sensation is symmetric to light touch. VII: Facial movement is symmetric.  VIII: hearing is intact to voice X: Uvula elevates symmetrically XI: Shoulder shrug is symmetric. XII: tongue is midline without atrophy or fasciculations.  Motor: Good effort thorughout, at Least 4/5 left upper extremity, 5/5 right upper extremity, 4/5 left lower extremity, 5/5 right lower extremity.Sensory: Sensation is grossly intact bilateral UEs & LEs   Labs on Admission: I have personally reviewed following labs and imaging studies  CBC: Recent Labs  Lab 08/27/21 1251 08/27/21 1314  WBC 8.3  --   NEUTROABS 5.8  --   HGB 13.6 13.9  HCT 41.0 41.0  MCV 94.7  --   PLT 189  --     Basic Metabolic Panel: Recent Labs  Lab 08/27/21 1251 08/27/21 1314  NA 139 138  K 4.2 4.2  CL 101 104  CO2 28  --   GLUCOSE 105* 103*  BUN 21 23  CREATININE 0.79 0.80  CALCIUM 9.7  --     GFR: Estimated Creatinine Clearance: 51.1 mL/min (by C-G formula based on SCr of 0.8 mg/dL).  Liver Function Tests: Recent Labs  Lab 08/27/21 1251  AST 38  ALT 28  ALKPHOS 43  BILITOT 1.2  PROT 7.1   ALBUMIN 3.9  Urine analysis:    Component Value Date/Time   COLORURINE STRAW (A) 08/27/2021 1800   APPEARANCEUR CLEAR 08/27/2021 1800   LABSPEC 1.010 08/27/2021 1800   PHURINE 8.0 08/27/2021 1800   GLUCOSEU NEGATIVE 08/27/2021 1800   HGBUR NEGATIVE 08/27/2021 1800   BILIRUBINUR NEGATIVE 08/27/2021 1800   KETONESUR NEGATIVE 08/27/2021 1800   PROTEINUR NEGATIVE 08/27/2021 1800   UROBILINOGEN 0.2 08/28/2014 0958   NITRITE NEGATIVE 08/27/2021 1800   LEUKOCYTESUR NEGATIVE 08/27/2021 1800    Radiological Exams on Admission: CT HEAD WO CONTRAST ( )  Addendum Date: 08/27/2021   ADDENDUM REPORT: 08/27/2021 11:51 ADDENDUM: Findings discussed with Judd Gaudier, NP via telephone at 11:42 a.m. Additionally, I spoke with the technologist Kennyth Arnold and the patient will be transported to the emergency department for management. Electronically Signed   By: Feliberto Harts M.D.   On: 08/27/2021 11:51   Result Date: 08/27/2021 CLINICAL DATA:  Dizziness, non-specific Dizziness, non-specific; progressive unsteady gait, atypical frequent falls, difficulty focusing, now with new headaches, progresive 8 weeks, r/o NPH EXAM: CT HEAD WITHOUT CONTRAST TECHNIQUE: Contiguous axial images were obtained from the base of the skull through the vertex without intravenous contrast. RADIATION DOSE REDUCTION: This exam was performed according to the departmental dose-optimization program which includes automated exposure control, adjustment of the mA and/or kV according to patient size and/or use of iterative reconstruction technique. COMPARISON:  None Available. FINDINGS: Brain: Extensive vasogenic edema centered in the right frontal lobe but also involving the anterior parietal lobe and temporal lobes. Probable mass lesion in the right basal ganglia region and overlying frontal lobe. Resulting mass effect with effacement the right lateral ventricle and approximately 8 mm of leftward midline shift. No evidence of  hydrocephalus. No clear evidence of acute hemorrhage. Basal cisterns are patent. Vascular: No hyperdense vessel identified. Calcific intracranial atherosclerosis Skull: No acute fracture. Sinuses/Orbits: Clear sinuses.  No acute orbital findings. Other: No mastoid effusions. IMPRESSION: Extensive vasogenic edema in the right frontal, parietal and temporal lobes with probable mass in the right basal ganglia and overlying frontal lobe, concerning for malignancy but not well evaluated. Resulting mass effect with approximately 8 mm of leftward midline shift and effacement the right lateral ventricle. Recommend MRI with contrast to further evaluate. Also, recommend urgent neurosurgical consultation. Electronically Signed: By: Feliberto Harts M.D. On: 08/27/2021 11:35   MR BRAIN W WO CONTRAST  Result Date: 08/27/2021 CLINICAL DATA:  Brain mass.  Abnormal CT today. EXAM: MRI HEAD WITHOUT AND WITH CONTRAST TECHNIQUE: Multiplanar, multiecho pulse sequences of the brain and surrounding structures were obtained without and with intravenous contrast. CONTRAST:  7mL GADAVIST GADOBUTROL 1 MMOL/ML IV SOLN COMPARISON:  CT head 08/27/2021.  MRI head 02/01/2007 FINDINGS: Brain: Large infiltrating mass in the right frontal and temporal lobe. There are areas of edema within the mass and areas of enhancement within the mass. The areas of enhancement show restricted diffusion. There is mass-effect and 8 mm midline shift to the left. There is compression of the right lateral ventricle. No hydrocephalus. Multiple areas of enhancement including the right anterior frontal lobe, right insula, right basal ganglia. Masslike is relatively solid without areas of necrosis. The mass appears to be centered in the right basal ganglia. No associated hemorrhage. No abnormal enhancement or edema in the left hemisphere. Brainstem and cerebellum normal. Vascular: Normal arterial flow voids. Skull and upper cervical spine: No focal lesion.  Sinuses/Orbits: Paranasal sinuses clear. Bilateral cataract extraction Other: None IMPRESSION: Large infiltrating mass in the right frontal and temporal lobe  extending back towards the parietal lobe. There are areas of nonenhancing edema as well as nodular areas of enhancement. The epicenter appears to be in the right basal ganglia. 8 mm midline shift to the left. Findings most consistent with primary CNS neoplasm. CNS lymphoma is a possibility given the homogeneous enhancement and lack of necrosis. Glioblastoma also possible. Tissue sampling will be necessary for diagnosis. Electronically Signed   By: Marlan Palau M.D.   On: 08/27/2021 17:55    EKG: Independently reviewed.  Sinus bradycardia 59 bpm.  First-degree AV block.  Nonspecific ST changes.  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 > Confirmed on MRI here suspicious for glioblastoma versus lymphoma. > Neurosurgery consulted in the ED recommend Decadron 4 edema and consider CT scan to search for mets/primary as well as PT OT evaluation. > Based on neurosurgery's discussion with patient she is wanting a palliative approach which she confirms with me.  She states she is a DNR/DNI and that she would not want to have aggressive measures such as chemo or radiation at her age and so she would not like to pursue biopsy. - Monitor on telemetry - Appreciate neurosurgery recommendations - Continue Decadron every 6 hours - PT and OT eval and treat - CT of the chest abdomen pelvis - Palliative consult  Pain - Trial of Toradol for right-sided hip  pain.  Hypertension - Continue olmesartan  CKD 2 > Creatinine stable in the ED - Continue to monitor   Hypothyroidism - Continue home liothyronine  DVT prophylaxis: Lovenox Code Status:   DNR, Does not want invasive interventions  Family Communication:  Husband and son updated by phone  Disposition Plan:   Patient is from:  Home  Anticipated DC to:  Home  Anticipated DC date:  1 to 3 days  Anticipated DC barriers: None   Consults called:  Neurosurgery, consulted in the ED, appears they will continue to follow along but would like to be notified if patient decides to proceed with aggressive treatment, requiring biopsy Admission status:  Observation, progressive  Severity of Illness: The appropriate patient status for this patient is OBSERVATION. Observation status is judged to be reasonable and necessary in order to provide the required intensity of service to ensure the patient's safety. The patient's presenting symptoms, physical exam findings, and initial radiographic and laboratory data in the context of their medical condition is felt to place them at decreased risk for further clinical deterioration. Furthermore, it is anticipated that the patient will be medically stable for discharge from the hospital within 2 midnights of admission.    Synetta Fail MD [facility]  How to contact the [facility] Attending or Consulting provider 7A - 7P or covering provider during after hours 7P -7A, for this patient?   Check the care team in [facility] and look for a) attending/consulting [facility] provider listed and b) the [facility] team listed Log into [email or web address] and use [facility]'s universal password to access. If you do not have the password, please contact the hospital operator. Locate the [facility] provider you are looking for under [facility] and page to a number  that you can be directly reached. If you still have difficulty reaching the provider, please page the [facility] (Director on Call) for  the Hospitalists listed on amion for assistance.  08/27/2021, 7:18 PM

## 2021-08-28 ENCOUNTER — Other Ambulatory Visit: Payer: Self-pay | Admitting: Neurosurgery

## 2021-08-28 ENCOUNTER — Inpatient Hospital Stay (HOSPITAL_COMMUNITY): Payer: Medicare PPO

## 2021-08-28 ENCOUNTER — Observation Stay (HOSPITAL_COMMUNITY): Payer: Medicare PPO

## 2021-08-28 ENCOUNTER — Other Ambulatory Visit: Payer: Self-pay | Admitting: Radiation Therapy

## 2021-08-28 DIAGNOSIS — I2510 Atherosclerotic heart disease of native coronary artery without angina pectoris: Secondary | ICD-10-CM | POA: Diagnosis not present

## 2021-08-28 DIAGNOSIS — J8410 Pulmonary fibrosis, unspecified: Secondary | ICD-10-CM | POA: Diagnosis not present

## 2021-08-28 DIAGNOSIS — G935 Compression of brain: Secondary | ICD-10-CM | POA: Diagnosis not present

## 2021-08-28 DIAGNOSIS — N281 Cyst of kidney, acquired: Secondary | ICD-10-CM | POA: Diagnosis not present

## 2021-08-28 DIAGNOSIS — C8339 Primary central nervous system lymphoma: Secondary | ICD-10-CM | POA: Diagnosis present

## 2021-08-28 DIAGNOSIS — Z515 Encounter for palliative care: Secondary | ICD-10-CM

## 2021-08-28 DIAGNOSIS — Z7189 Other specified counseling: Secondary | ICD-10-CM

## 2021-08-28 DIAGNOSIS — D7389 Other diseases of spleen: Secondary | ICD-10-CM | POA: Diagnosis not present

## 2021-08-28 DIAGNOSIS — K5730 Diverticulosis of large intestine without perforation or abscess without bleeding: Secondary | ICD-10-CM | POA: Diagnosis not present

## 2021-08-28 DIAGNOSIS — G9389 Other specified disorders of brain: Secondary | ICD-10-CM | POA: Diagnosis not present

## 2021-08-28 DIAGNOSIS — J9811 Atelectasis: Secondary | ICD-10-CM | POA: Diagnosis not present

## 2021-08-28 DIAGNOSIS — D496 Neoplasm of unspecified behavior of brain: Secondary | ICD-10-CM | POA: Diagnosis present

## 2021-08-28 DIAGNOSIS — C8589 Other specified types of non-Hodgkin lymphoma, extranodal and solid organ sites: Secondary | ICD-10-CM | POA: Diagnosis present

## 2021-08-28 DIAGNOSIS — C729 Malignant neoplasm of central nervous system, unspecified: Secondary | ICD-10-CM | POA: Diagnosis not present

## 2021-08-28 LAB — CBC
HCT: 37.1 % (ref 36.0–46.0)
Hemoglobin: 13 g/dL (ref 12.0–15.0)
MCH: 31.9 pg (ref 26.0–34.0)
MCHC: 35 g/dL (ref 30.0–36.0)
MCV: 90.9 fL (ref 80.0–100.0)
Platelets: 189 10*3/uL (ref 150–400)
RBC: 4.08 MIL/uL (ref 3.87–5.11)
RDW: 13.9 % (ref 11.5–15.5)
WBC: 8.6 10*3/uL (ref 4.0–10.5)
nRBC: 0 % (ref 0.0–0.2)

## 2021-08-28 LAB — COMPREHENSIVE METABOLIC PANEL
ALT: 30 U/L (ref 0–44)
AST: 36 U/L (ref 15–41)
Albumin: 3.3 g/dL — ABNORMAL LOW (ref 3.5–5.0)
Alkaline Phosphatase: 42 U/L (ref 38–126)
Anion gap: 5 (ref 5–15)
BUN: 21 mg/dL (ref 8–23)
CO2: 26 mmol/L (ref 22–32)
Calcium: 9.2 mg/dL (ref 8.9–10.3)
Chloride: 104 mmol/L (ref 98–111)
Creatinine, Ser: 0.82 mg/dL (ref 0.44–1.00)
GFR, Estimated: >60 mL/min (ref >60)
Glucose, Bld: 133 mg/dL — ABNORMAL HIGH (ref 70–99)
Potassium: 3.5 mmol/L (ref 3.5–5.1)
Sodium: 135 mmol/L (ref 135–145)
Total Bilirubin: 0.7 mg/dL (ref 0.3–1.2)
Total Protein: 6.5 g/dL (ref 6.5–8.1)

## 2021-08-28 MED ORDER — ALUM & MAG HYDROXIDE-SIMETH 200-200-20 MG/5ML PO SUSP
30.0000 mL | ORAL | Status: DC | PRN
Start: 2021-08-28 — End: 2021-08-29
  Administered 2021-08-28: 30 mL via ORAL
  Filled 2021-08-28: qty 30

## 2021-08-28 MED ORDER — GADOBUTROL 1 MMOL/ML IV SOLN
7.0000 mL | Freq: Once | INTRAVENOUS | Status: AC | PRN
  Administered 2021-08-28: 7 mL via INTRAVENOUS

## 2021-08-28 MED ORDER — IOHEXOL 300 MG/ML  SOLN
100.0000 mL | Freq: Once | INTRAMUSCULAR | Status: AC | PRN
  Administered 2021-08-28: 100 mL via INTRAVENOUS

## 2021-08-28 NOTE — Progress Notes (Signed)
          This pt has been referred to our [facility] program. This is a home-based Palliative care program that is provided by [facility]. We will follow the pt at home after discharge to assist with any chronic/acute symptom management needs. We utilize her PCP as the attending for this program. She will be getting nursing visits 1-3 times a month and SW support in the home with these services. She will also have after hours nursing support as well.    She is eligible for other HH services in home with our program in place as well.  Caitlen Worth RN [phone]  

## 2021-08-28 NOTE — Hospital Course (Signed)
Daisy Oliver is an 86 y.o. F with HTN, hypothyroidism who presented with few weeks of imbalance, left sided weakness to her PCP and subsequent CT head showed brain mass so she was sent to the ER.

## 2021-08-28 NOTE — Consult Note (Addendum)
Palliative Medicine Inpatient Consult Note  Consulting Provider: Marcelyn Bruins, MD  Reason for consult:   [place] Palliative Medicine Consult  Reason for Consult? New Brain Mass   Patient with focal neurologic symptoms now presenting for new brain mass on imaging with edema.  Suspicious for lesion with poor prognosis.  Has been seen by neurosurgery.  Patient reportedly is DNR/DNI and would not like to    HPI:  Per intake H&P --> Daisy Oliver is a 86 y.o. female with medical history significant of hypertension, hypothyroidism, hyperlipidemia, CKD 2, GERD presenting with abnormal CT scan outpatient.Patient reporting history of weakness since January on the left side.  Also some issues with her balance and has had some falls.  Had outpatient CT done today after seeing her PCP which showed brain mass and edema with left word shift.  Sent to the ED for further evaluation.  Also reporting having a little trouble with her speech at times and difficulty typing with her left hand.  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 She has opted to not pursue aggressive measures therefore Palliative care has been asked to further discuss goals of care.   Clinical Assessment/Goals of Care:  *Please note that this is a verbal dictation therefore any spelling or grammatical errors are due to the "[employer] [employer]" system interpretation.  I have reviewed medical records including EPIC notes, labs and imaging, received report from bedside RN, assessed the patient.    I met with Daisy Oliver this morning to further discuss diagnosis prognosis, GOC, EOL wishes, disposition and options.   I introduced Palliative Medicine as specialized medical care for people living with serious illness. It focuses on providing relief from the symptoms and stress of a serious illness. The  goal is to improve quality of life for both the patient and the family.  Medical History Review and Understanding:  A review of patients symptoms leading to admission was had. She shares that she experienced three recent falls, difficulty with swallowing, left handed dis-coordination, and speech difficulties. She expressed that her PCP was hoping that would the result of potential lyme disease though when that testing came back negative additional workup was pursued. Daisy Oliver knows she has a brain tumor on the right side.   Daisy Oliver reviews her history of GERD and acute intermittent porphyria with me.  Social History:  Daisy Oliver is originally from [place], [place]. She is married and has been with her spouse since high school. She has two children, a son who lives in [place] and a daughter who lives in [place]. 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 She is a woman with a green thumb and shares that she presently lives on seven acres of land in the [place] area. She is identified to be within the Protestant faith.   Functional and Nutritional State:  Prior to hospitalization, Daisy Oliver was able to perform all bADLs and iADLS without assistance. She does share recent falls as above. She also reviews that she has not been able to type with her left hand therefore her emails are very disorganized.   Appetite has been fair, though Daisy Oliver shares she cannot swallow large texture foods. She shares more yogurt type consistencies are tolerable.   Palliative Symptoms:  Dysphagia, Dizziness, imbalance, L hand grip strength weakness, pain from back down posterior legs.   Advance Directives:  A detailed  discussion was had today regarding advanced directives.  [name] has all of these which she has completed. She shares that she has finances arranged for she and her spouse separately for care needs.   Code Status:  Daisy Oliver is an established DNAR/DNI.   Discussion:  I asked Daisy Oliver what she understands about  her disease process. She expresses that she has a brain tumor and she can either have the neurosurgeon go into it and retrieve tissue then go through chemo/radiation or alternatively she can do nothing and have limited time on earth. Daisy Oliver shares when she was quantifying the math in her head she does not feel that either route would result in a terribly long amount of time. She shares that as of right now she does not feel she wants to do anything. She shares that she would like the opportunity to discuss this more with her son and daughter though. She also shares that her husband had spinal surgery with Dr. Vertell Limber a while back and she feels they have an established relationship. She would be interested to gain his insights on her tumor and treatment options.   Reviewed the concerns of Daisy Oliver caring for her spouse as she has been his primary care taker. She shares that it is likely he will need to sell their home and move to an assisted living facility.   From the perspective of patients time on earth. She realizes that doing nothing would result in more limited time though she shares that she could remain functional as long as possible as opposed to the overwhelming burdens of treatments that she feels diminish time anyway. She expresses the loss of a friend who went through two years of treatments for pancreatic cancer and shares her decline over that time. She shares her journey on Hospice and how she was in [facility] which appeared to be a favorable experience.   We reviewed the differences between Palliative care and hospice care. I shared that has her disease progresses she will eventually benefit from hospice care. I described hospice as a service for patients who have a life expectancy of 6 months or less. The goal of hospice is the preservation of dignity and quality at the end phases of life. Under hospice care, the focus changes from curative to symptom relief.   For the time being, Daisy Oliver would  appreciate OP Palliative support.   Patient interested in learning more about medicinal marijuana use as she anticipates increased burden moving forward. She utilized a leg cream which was hemp infused which helped her tremendously.   Discussed the importance of continued conversation with family and their  medical providers regarding overall plan of care and treatment options, ensuring decisions are within the context of the patients values and GOCs.  Decision Maker: Patient can presently make decisions for herself. She would rely on her son and daughter otherwise is incapacitated.   SUMMARY OF RECOMMENDATIONS   DNAR/DNI  Patient does not think she would want a biopsy or radiation/chemo though would like some time to discuss this with her children  Patient would appreciate the insights of Dr. Vertell Limber if at all possible   Patient aware of poor prognosis  TOC - OP Palliative care on discharge  Would likely benefit from OP PT/OT/Speech for Palliative symptoms  Daisy Oliver does help with pain in conjunction with steroids. Hemp cream also alleviates discomfort in back of legs.  Ongoing Palliative support until discharged  Code Status/Advance Care Planning: DNAR/DNI  Palliative Prophylaxis:  Aspiration,  Bowel Regimen, Delirium Protocol, Frequent Pain Assessment, Oral Care, Palliative Wound Care, and Turn Reposition  Additional Recommendations (Limitations, Scope, Preferences): Continue current care  Psycho-social/Spiritual:  Desire for further Chaplaincy support: Not presently Additional Recommendations: Education on disease burden.    Prognosis: Very limited. Hospice would be an appropriate consideration.   Discharge Planning: Discharge will be home with OP Palliative support.    Vitals:   08/27/21 2301 08/28/21 0348  BP: (!) 144/59 (!) 133/49  Pulse: (!) 54 (!) 48  Resp: 17 17  Temp: 97.7 F (36.5 C) 98.6 F (37 C)  SpO2: 97% 95%   No intake or output data in the 24 hours  ending 08/28/21 0642 Last Weight  Most recent update: 08/28/2021  4:37 AM    Weight  71 kg (156 lb 8.4 oz)            Gen:  Elderly Caucasian F in NAD HEENT: moist mucous membranes CV: Regular rate and rhythm PULM: clear to auscultation bilaterally. No wheezes/rales/rhonchi ABD: soft/nontender/nondistended/normal bowel sounds EXT: No edema Neuro: Alert and oriented x3  PPS: 60%   This conversation/these recommendations were discussed with patient primary care team, Dr. Loleta Books  Total Time: 38  Billing based on MDM: High  Problems Addressed: One acute or chronic illness or injury that poses a threat to life or bodily function  Amount and/or Complexity of Data: Category 3:Discussion of management or test interpretation with external physician/other qualified health care professional/appropriate source (not separately reported)  Risks: Decision not to resuscitate or to de-escalate care because of poor prognosis ______________________________________________________ Addendum:  I met with Daisy Oliver, her spouse, her daughter, and her son at bedside. We reviewed that Daisy Oliver is presently in a position where without additional interventions/treatments it is anticipated that she will continue to decline over time. We discussed the additional care needs and ancillary services which would be needed as a result of that. We reviewed the differences between outpatient palliative and hospice care. Patients daughter shares that she has been coordinating with multiple agencies to set up outpatient caregivers. Patients family all share that they are in support of whatever Daisy Oliver decides. They are in agreement with no additional treatment options if this is what she wants.   I shared that often hospice care will enroll patients with brain lesions as these are considered to be more aggressive forms of cancer. Family are quite interested in hospice though they would also like to hear from Dr. Loleta Books.  Dr.  Loleta Books was able to come to the meeting and review brain imaging with patients family. Discussed that it's hard to know the type of cancer that Daisy Oliver has though it is suspected to likely be primary to the brain based upon additional full body imaging. He shared that he understands family was interested in a second opinion from Dr. Vertell Limber who is no longer on service therefore the plan will be for Daisy Oliver to be seen by another neurosurgeon while in house. She and her family all agree with this plan.  In regard to discharge planning - the goal will be for patient to transition home with OP Palliative support at this time. When ready for hospice patient and her family will take these steps.  Additional Time: 55  [place] Team Team Cell Phone: [phone] Please utilize secure chat with additional questions, if there is no response within 30 minutes please call the above phone number  Palliative Medicine Team providers are available by phone from 7am  to 7pm daily and can be reached through the team cell phone.  Should this patient require assistance outside of these hours, please call the patient's attending physician.

## 2021-08-28 NOTE — TOC Initial Note (Signed)
Transition of Care [facility]) - Initial/Assessment Note    Patient Details  Name: Daisy Oliver MRN: [id number] Date of Birth: 08/29/1935  [facility] CM/SW Contact:    Pollie Friar, RN Phone Number: 08/28/2021, 2:15 PM  Clinical Narrative:                 Patient is from home with her spouse who she provides assistance to. Family supportive and are planning to hire caregivers after discharge.  Recommendations for palliative care at home. CM met with the patient and her family and provided choice. [facility] decided on. Cheri with [facility] has accepted the referral. [facility] will transition her to hospice services at home when appropriate.  Family also interested in home therapies for the time being. Daughter asked to use [facility]. Cory with [facility] has accepted the referral.  TOC following for further d/c needs.   Expected Discharge Plan: [place] Barriers to Discharge: Continued Medical Work up   Patient Goals and CMS Choice   CMS Medicare.gov Compare Post Acute Care list provided to:: Patient Represenative (must comment) Choice offered to / list presented to : Adult Children  Expected Discharge Plan and Services Expected Discharge Plan: [place]   Discharge Planning Services: CM Consult Post Acute Care Choice: [place] arrangements for the past 2 months: [place]: PT, OT HH Agency: [place] Date [facility] Agency Contacted: 08/28/21   Representative spoke with at Center: Tommi Rumps  Prior Living Arrangements/Services Living arrangements for the past 2 months: [place] Lives with:: Spouse Patient language and need for interpreter reviewed:: Yes Do you feel safe going back to the place where you live?: Yes      Need for Family Participation in Patient Care: Yes (Comment) Care giver support system in place?: Yes (comment)   Criminal  Activity/Legal Involvement Pertinent to Current Situation/Hospitalization: No - Comment as needed  Activities of Daily Living Home Assistive Devices/Equipment: Eyeglasses ADL Screening (condition at time of admission) Patient's cognitive ability adequate to safely complete daily activities?: Yes Is the patient deaf or have difficulty hearing?: No Does the patient have difficulty seeing, even when wearing glasses/contacts?: No Does the patient have difficulty concentrating, remembering, or making decisions?: No Patient able to express need for assistance with ADLs?: Yes Does the patient have difficulty dressing or bathing?: No Independently performs ADLs?: Yes (appropriate for developmental age) Does the patient have difficulty walking or climbing stairs?: Yes Weakness of Legs: Both Weakness of Arms/Hands: Left  Permission Sought/Granted                  Emotional Assessment Appearance:: Appears stated age Attitude/Demeanor/Rapport: Engaged Affect (typically observed): Accepting Orientation: : Oriented to Self, Oriented to Place, Oriented to  Time, Oriented to Situation   Psych Involvement: No (comment)  Admission diagnosis:  Neoplasm of brain causing mass effect on adjacent structures [facility]) [D49.6] Neoplasm of brain causing mass effect and brain compression on adjacent structures ([place]) [D49.6, G93.5] Brain tumor [facility]) [D49.6] Patient Active Problem List   Diagnosis Date Noted   Brain tumor ([place]) 08/28/2021   Right frontal lobe mass 08/27/2021   Neoplasm of brain causing mass effect and brain compression on adjacent structures ([place]) 08/27/2021   Age-related osteoporosis without current pathological  fracture 10/10/2020   CKD (chronic kidney disease) stage 2, GFR 60-89 ml/min 03/29/2020   History of basal cell cancer 03/17/2018   Vitamin D deficiency 08/28/2014   Medication management 08/28/2014   Hypothyroidism 08/28/2014   Hypertension 08/25/2013   Hyperlipidemia  08/25/2013   Migraines 08/25/2013   Rosacea 08/25/2013   PCP:  [name], MD Pharmacy:   [id number] [place], [place] - [address] [place] [address]Phone: [phone]: [phone]    Social Determinants of Health (SDOH) Interventions    Readmission Risk Interventions     View : No data to display.

## 2021-08-28 NOTE — Consult Note (Signed)
CC:  brain mass 2nd opinion  HPI:     Patient is a 86 y.o. female presents with progressive dizziness, imbalance, and left sided apraxia who was found to have an infiltrative extensive right sided brain mass.  She was started on dexamethasone which helped her symptoms somewhat. Metastatic workup with CT chest/abd/pelvis was negative.  My partner Dr. Ronnald Ramp discussed with the patient possibility of biopsy with likely subsequent chemotherapy and/or radiation.  Patient declined further treatment so she was referred to hospice. However, after talking with friends and family, patient wished to discuss the possibility of treatment further.    Patient Active Problem List   Diagnosis Date Noted   Brain tumor ([place]) 08/28/2021   Right frontal lobe mass 08/27/2021   Neoplasm of brain causing mass effect and brain compression on adjacent structures ([place]) 08/27/2021   Age-related osteoporosis without current pathological fracture 10/10/2020   CKD (chronic kidney disease) stage 2, GFR 60-89 ml/min 03/29/2020   History of basal cell cancer 03/17/2018   Vitamin D deficiency 08/28/2014   Medication management 08/28/2014   Hypothyroidism 08/28/2014   Hypertension 08/25/2013   Hyperlipidemia 08/25/2013   Migraines 08/25/2013   Rosacea 08/25/2013   Past Medical History:  Diagnosis Date   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 Herbert Deaner   RADIAL HEAD ARTHROPLASTY Right 09/16/2012   Procedure: RADIAL HEAD ARTHROPLASTY;  Surgeon: Johnny Bridge, MD;  Location: [facility];  Service: Orthopedics;  Laterality: Right;  RADIAL HEAD REPLACEMENT      Medications Prior to Admission  Medication Sig Dispense Refill Last  Dose   acetaminophen (TYLENOL) 325 MG tablet Take 650 mg by mouth every 6 (six) hours as needed (for pain).   Past Week   aspirin EC 325 MG tablet Take 650 mg by mouth daily as needed (for pain).   08/26/2021 at 2200   BIOTIN PO Take 1 tablet by mouth daily.   Past Week   Calcium-Magnesium-Vitamin D (CALCIUM 1200+D3 PO) Take 1 tablet by mouth daily.   Past Week   Cyanocobalamin (B-12 PO) Place 1 tablet under the tongue in the morning.   08/26/2021 at am   Digestive Enzymes (BETAINE HCL PO) Take 1 capsule by mouth in the morning. Ortho Biotic   08/26/2021 at am   Ergocalciferol (VITAMIN D2) 2000 UNITS TABS Take 2,000 Units by mouth daily.   08/26/2021   erythromycin with ethanol (THERAMYCIN) 2 % external solution Apply topically daily. (Patient taking differently: Apply 1 application. topically daily as needed (to insect bites).) 60 mL 1 unk   fluticasone (CUTIVATE) 0.05 % cream Apply 1 application. topically 2 (two) times daily as needed (for itching).   unk   liothyronine (CYTOMEL) 5 MCG tablet Take 2 tablets (10 mcg total) by mouth 2 (two) times daily. (Patient taking differently: Take 10 mcg by mouth See admin instructions. Take 10 mcg by mouth before breakfast and lunch) 360 tablet 3 08/27/2021 at am   Magnesium 500 MG CAPS Take 500 mg by mouth daily.   08/26/2021   meclizine (ANTIVERT) 25 MG tablet 1/2-1 pill up to 3 times daily for motion sickness/dizziness (Patient taking differently: Take 25 mg by  mouth 3 (three) times daily as needed for dizziness.) 30 tablet 0 unk   Misc Natural Products (OSTEO BI-FLEX JOINT SHIELD PO) Take 1 tablet by mouth at bedtime. Osteo Bi-Flex   08/26/2021 at pm   naproxen sodium (ALEVE) 220 MG tablet Take 220-440 mg by mouth 2 (two) times daily as needed (for pain).   Past Week   NEOMYCIN-POLYMYXIN-HYDROCORTISONE (CORTISPORIN) 1 % SOLN OTIC solution Place 6 drops into both ears 4 (four) times daily. (Patient taking differently: Place 6 drops into the right ear daily as  needed ("for pain").) 10 mL 3 unk   NON FORMULARY Apply 1 application. topically See admin instructions. CBD cream- Apply topically as needed for pain relief   unk   NON FORMULARY Take 1 capsule by mouth See admin instructions. Pure Encapsulations P5P 50  Vitamin B6 Supplement- Take 1 capsule by mouth once a day   08/26/2021   olmesartan (BENICAR) 20 MG tablet Take 1/2 tablet  Daily  for BP (Patient taking differently: Take 10 mg by mouth daily.) 45 tablet 3 08/26/2021   Omega-3 Fatty Acids (FISH OIL PO) Take 1 capsule by mouth in the morning.   08/26/2021   Taurine 1000 MG CAPS Take 1,000 mg by mouth daily.   08/26/2021   tretinoin (RETIN-A) 0.05 % cream Apply topically at bedtime. (Patient taking differently: Apply 1 application. topically every Monday, Wednesday, and Friday at 8 PM.) 45 g 0 Past Week   Zinc 50 MG CAPS Take 50 mg by mouth daily.   08/26/2021   clotrimazole-betamethasone (LOTRISONE) cream Apply 1 application topically 2 (two) times daily. (Patient not taking: Reported on 08/27/2021) 15 g 2 Not Taking   fexofenadine (ALLEGRA) 180 MG tablet Take 1 tablet (180 mg total) by mouth daily. (Patient not taking: Reported on 08/27/2021)   Not Taking   L-Methylfolate-Algae-B12-B6 (METANX PO) Take by mouth daily.   unk   Allergies  Allergen Reactions   Barbiturates Other (See Comments)    Reaction not cited   Other Other (See Comments)    Patient has AIP and is not to have hormones or red wine   Pentothal [Thiopental] Other (See Comments)    Reaction not cited   Sulfa Antibiotics Other (See Comments)    Reaction not cited   Wheat Bran Nausea And Vomiting    Social History   Tobacco Use   Smoking status: Former    Packs/day: 2.00    Years: 22.00    Pack years: 44.00    Types: Cigarettes    Quit date: 09/17/1983    Years since quitting: 37.9   Smokeless tobacco: Never  Substance Use Topics   Alcohol use: Not Currently    Alcohol/week: 0.0 - 1.0 standard drinks    Comment: 2/month     Family History  Problem Relation Age of Onset   Heart disease Mother    Brain cancer Father 53       Brain tumor, not cancerous   Hypertension Sister    Osteoporosis Sister    Arthritis Sister    Scoliosis Sister    Hypertension Sister    Osteoporosis Sister    Cancer Paternal Grandfather    Suicidality Paternal Grandfather    Breast cancer Neg Hx      Review of Systems Pertinent items are noted in HPI.  Objective:   Patient Vitals for the past 8 hrs:  BP Temp Temp src Pulse Resp SpO2  08/28/21 1522 (!) 159/65 98.1 F (36.7 C) Oral Marland Kitchen)  53 18 99 %  08/28/21 1140 (!) 141/59 97.9 F (36.6 C) Oral (!) 56 18 96 %  08/28/21 0745 132/63 98.4 F (36.9 C) Oral (!) 59 -- 96 %   No intake/output data recorded. No intake/output data recorded.      General : Alert, cooperative, no distress, appears stated age   Head:  Normocephalic/atraumatic    Eyes: PERRL, conjunctiva/corneas clear, EOM's intact. Fundi could not be visualized Neck: Supple Chest:  Respirations unlabored Chest wall: no tenderness or deformity Heart: Regular rate and rhythm Abdomen: Soft, nontender and nondistended Extremities: warm and well-perfused Skin: normal turgor, color and texture Neurologic:  Alert, oriented x 3.  Eyes open spontaneously. PERRL, EOMI, VFC, no facial droop. V1-3 intact.  No dysarthria, tongue protrusion symmetric.  CNII-XII intact. L pronator drift.  + Romberg's.  Occasional inattention noted.       Data ReviewCBC:  Lab Results  Component Value Date   WBC 8.6 08/28/2021   RBC 4.08 08/28/2021   BMP:  Lab Results  Component Value Date   GLUCOSE 133 (H) 08/28/2021   CO2 26 08/28/2021   BUN 21 08/28/2021   CREATININE 0.82 08/28/2021   CREATININE 0.72 04/01/2021   CALCIUM 9.2 08/28/2021   Radiology review:   Previous CT head and MRI brain with and without contrast on 5/17 was reviewed.  There is an infiltrative homogeneously enhancing irregular mass centered mostly in the  right basal ganglia and insular region.  There is separate lesion in the right frontal pole.  There is associated vasogenic edema.  Assessment:   Principal Problem:   Neoplasm of brain causing mass effect and brain compression on adjacent structures [facility]) Active Problems:   Hypertension   CKD (chronic kidney disease) stage 2, GFR 60-89 ml/min   Brain tumor [facility])  This is a 86 year old woman with an enhancing brain mass likely representing a primary malignant brain tumor.  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  GBM would be less likely, and rare inflammatory or infectious disorder appears very unlikely.  Plan:  -I had a long discussion with the patient and her family at the bedside.  I discussed the differential diagnosis.  With my high suspicion for lymphoma, I discussed the possibility of chemotherapy with methotrexate or another agent.  I explained that especially with her age, cure is not likely and it is unclear if good control of the disease can be achieved.  If she is fairly certain she does not wish to pursue chemotherapy or radiation in the face of poor prognosis, it would not make sense to pursue biopsy.  However, patient and the family thinks they wish to try a treatment.  I will have her stop her dexamethasone to maximize the chance of successful biopsy.  We will plan for biopsy next Tuesday.  She will need a BrainLab protocol MRI preoperatively.  She is eager to go home today which would be reasonable.  She likely will need to be set up for slit-lamp exam with ophthalmology, and we can discuss her case at our brain tumor interdisciplinary conference.  The general technique of surgery, as well as risk, benefits, alternatives, and expected convalescence were discussed with her.  Risk discussed included, but were not limited to, bleeding, pain, infection, nondiagnostic sample, neurologic deficit, and death.  Informed consent was obtained.  She  will need to stop taking any aspirin prior to the procedure.  All questions and concerns were answered.  She  verbalized understanding and agreement with the plan.

## 2021-08-28 NOTE — Progress Notes (Signed)
PT Cancellation Note  Patient Details Name: Daisy Oliver MRN: [id number] DOB: 07-22-1935   Cancelled Treatment:    Reason Eval/Treat Not Completed: PT screened, no needs identified, will sign off Patient seen to initiate evaluation, however patient and family politely decline services as she was set up with OPPT prior to hospitalization for current deficits in balance and strength. Patient and family decline need for equipment. PT will sign off at this time. Patient is planning on discharging at end of this day.   Emer Onnen A. Gilford Rile PT, DPT Acute Rehabilitation Services Pager [phone] Office [phone]    Linna Hoff 08/28/2021, 4:05 PM

## 2021-08-28 NOTE — Discharge Summary (Signed)
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 F with HTN, hypothyroidism who presented with few weeks of imbalance, left sided weakness to her PCP and subsequent CT head showed brain mass so she was sent to the ER.    The patient was evaluated by Neurosurgery.  She was offered conservative approach and aggressive/biopsy approach.  In discussion with family, they have elected to pursue biopsy.  Neurosurgery recommended stopping Decadron for now.  No seizure activity observed here.  She underwent planning MRI in the hospital and will follow up for short interval biopsy next week.                 Consultants: Neurosurgery, Palliative Care Procedures performed: MRI brain  Disposition: Home health    DISCHARGE MEDICATION: Allergies as of 08/28/2021       Reactions   Barbiturates Other (See Comments)   Reaction not cited   Other Other (See Comments)   Patient has AIP and is not to have hormones or red wine   Pentothal [thiopental] Other (See Comments)   Reaction not cited   Sulfa Antibiotics Other (See Comments)   Reaction not cited   Wheat Bran Nausea And Vomiting        Medication List     STOP taking these medications    aspirin EC 325 MG tablet   naproxen sodium 220 MG tablet Commonly known as: ALEVE       TAKE these medications    acetaminophen 325 MG tablet Commonly known as: TYLENOL Take  650 mg by mouth every 6 (six) hours as needed (for pain).   B-12 PO Place 1 tablet under the tongue in the morning.   BETAINE HCL PO Take 1 capsule by mouth in the morning. Ortho Biotic   BIOTIN PO Take 1 tablet by mouth daily.   CALCIUM 1200+D3 PO Take 1 tablet by mouth daily.   clotrimazole-betamethasone cream Commonly known as: LOTRISONE Apply 1 application topically 2 (two) times daily.   erythromycin with ethanol 2 % external solution Commonly known as: THERAMYCIN Apply topically daily. What changed:  how much to take when to take this reasons to take this   fexofenadine 180 MG tablet Commonly known as: ALLEGRA Take 1 tablet (180 mg total) by mouth daily.   FISH OIL PO Take 1 capsule by mouth in the morning.   fluticasone 0.05 % cream Commonly known as: CUTIVATE Apply 1 application. topically 2 (two) times daily as needed (for itching).   liothyronine 5 MCG tablet Commonly known as: CYTOMEL Take 2 tablets (10 mcg total) by mouth 2 (two) times daily. What changed:  when to take this additional instructions   Magnesium 500 MG Caps Take 500 mg by mouth daily.   meclizine 25 MG tablet Commonly known as: ANTIVERT 1/2-1 pill up to 3 times daily for motion sickness/dizziness What changed:  how much to take how to  take this when to take this reasons to take this additional instructions   METANX PO Take by mouth daily.   NEOMYCIN-POLYMYXIN-HYDROCORTISONE 1 % Soln OTIC solution Commonly known as: CORTISPORIN Place 6 drops into both ears 4 (four) times daily. What changed:  how to take this when to take this reasons to take this   NON FORMULARY Apply 1 application. topically See admin instructions. CBD cream- Apply topically as needed for pain relief   NON FORMULARY Take 1 capsule by mouth See admin instructions. Pure Encapsulations P5P 50  Vitamin B6 Supplement- Take 1 capsule by mouth once a day   olmesartan 20 MG tablet Commonly known as:  BENICAR Take 1/2 tablet  Daily  for BP What changed:  how much to take how to take this when to take this additional instructions   OSTEO BI-FLEX JOINT SHIELD PO Take 1 tablet by mouth at bedtime. Osteo Bi-Flex   Taurine 1000 MG Caps Take 1,000 mg by mouth daily.   tretinoin 0.05 % cream Commonly known as: RETIN-A Apply topically at bedtime. What changed:  how much to take when to take this   Vitamin D2 50 MCG (2000 UT) Tabs Take 2,000 Units by mouth daily.   Zinc 50 MG Caps Take 50 mg by mouth daily.        Follow-up Information     [facility] Follow up.   Why: Palliative care services. The palliative care will contact you for the first home visit. Contact information: [address] [place] [address] [place] Follow up.   Specialty: Home Health Services Why: The home health agency will contact you for the first home visit. Contact information: [place] [address] [phone]         Vallarie Mare, MD Follow up.   Specialty: Neurosurgery Contact information: [address] [place] [address] [phone]                 Discharge Instructions     Discharge instructions   Complete by: As directed    STOP aspirin Avoid NSAIDs if possible for now (these have a small increased risk of bleeding) Follow up with Neurosurgery as directed   Increase activity slowly   Complete by: As directed        Discharge Exam: Filed Weights   08/27/21 1243 08/28/21 0437  Weight: 69.9 kg 71 kg    General: Pt is alert, awake, not in acute distress Cardiovascular: RRR, nl S1-S2, no murmurs appreciated.   No LE edema.   Respiratory: Normal respiratory rate and rhythm.  CTAB without rales or wheezes. Abdominal: Abdomen soft and non-tender.  No distension or HSM.   Neuro/Psych: Strength with mild left sided weakness, mild left  facial droop, normal speech, judgment and insight appear normal .   Condition at discharge: good  The results of significant diagnostics from this hospitalization (including imaging, microbiology, ancillary and laboratory) are listed below for reference.   Imaging Studies: CT HEAD WO CONTRAST (5MM)  Addendum Date: 08/27/2021   ADDENDUM REPORT: 08/27/2021 11:51 ADDENDUM: Findings discussed with Liane Comber, NP via telephone at 11:42 a.m. Additionally, I spoke with the technologist Marzetta Board and the patient will be transported to the emergency department for management. Electronically Signed   By: Margaretha Sheffield M.D.   On: 08/27/2021 11:51   Result Date: 08/27/2021 CLINICAL DATA:  Dizziness, non-specific  Dizziness, non-specific; progressive unsteady gait, atypical frequent falls, difficulty focusing, now with new headaches, progresive 8 weeks, r/o NPH EXAM: CT HEAD WITHOUT CONTRAST TECHNIQUE: Contiguous axial images were obtained from the base of the skull through the vertex without intravenous contrast. RADIATION DOSE REDUCTION: This exam was performed according to the departmental dose-optimization program which includes automated exposure control, adjustment of the mA and/or kV according to patient size and/or use of iterative reconstruction technique. COMPARISON:  None Available. FINDINGS: Brain: Extensive vasogenic edema centered in the right frontal lobe but also involving the anterior parietal lobe and temporal lobes. Probable mass lesion in the right basal ganglia region and overlying frontal lobe. Resulting mass effect with effacement the right lateral ventricle and approximately 8 mm of leftward midline shift. No evidence of hydrocephalus. No clear evidence of acute hemorrhage. Basal cisterns are patent. Vascular: No hyperdense vessel identified. Calcific intracranial atherosclerosis Skull: No acute fracture. Sinuses/Orbits: Clear sinuses.  No acute orbital findings. Other: No mastoid  effusions. IMPRESSION: Extensive vasogenic edema in the right frontal, parietal and temporal lobes with probable mass in the right basal ganglia and overlying frontal lobe, concerning for malignancy but not well evaluated. Resulting mass effect with approximately 8 mm of leftward midline shift and effacement the right lateral ventricle. Recommend MRI with contrast to further evaluate. Also, recommend urgent neurosurgical consultation. Electronically Signed: By: Margaretha Sheffield M.D. On: 08/27/2021 11:35   MR BRAIN W WO CONTRAST  Result Date: 08/27/2021 CLINICAL DATA:  Brain mass.  Abnormal CT today. EXAM: MRI HEAD WITHOUT AND WITH CONTRAST TECHNIQUE: Multiplanar, multiecho pulse sequences of the brain and surrounding structures were obtained without and with intravenous contrast. CONTRAST:  31m GADAVIST GADOBUTROL 1 MMOL/ML IV SOLN COMPARISON:  CT head 08/27/2021.  MRI head 02/01/2007 FINDINGS: Brain: Large infiltrating mass in the right frontal and temporal lobe. There are areas of edema within the mass and areas of enhancement within the mass. The areas of enhancement show restricted diffusion. There is mass-effect and 8 mm midline shift to the left. There is compression of the right lateral ventricle. No hydrocephalus. Multiple areas of enhancement including the right anterior frontal lobe, right insula, right basal ganglia. Masslike is relatively solid without areas of necrosis. The mass appears to be centered in the right basal ganglia. No associated hemorrhage. No abnormal enhancement or edema in the left hemisphere. Brainstem and cerebellum normal. Vascular: Normal arterial flow voids. Skull and upper cervical spine: No focal lesion. Sinuses/Orbits: Paranasal sinuses clear. Bilateral cataract extraction Other: None IMPRESSION: Large infiltrating mass in the right frontal and temporal lobe extending back towards the parietal lobe. There are areas of nonenhancing edema as well as nodular areas of  enhancement. The epicenter appears to be in the right basal ganglia. 8 mm midline shift to the left. Findings most consistent with primary CNS neoplasm. CNS lymphoma is a possibility given the homogeneous enhancement and lack of necrosis. Glioblastoma also possible. Tissue sampling will be necessary for diagnosis. Electronically Signed   By: CFranchot GalloM.D.   On: 08/27/2021 17:55   CT CHEST ABDOMEN PELVIS W CONTRAST  Result Date: 08/28/2021 CLINICAL DATA:  CNS neoplasm, evaluate for metastatic disease EXAM: CT CHEST, ABDOMEN, AND PELVIS WITH CONTRAST TECHNIQUE: Multidetector CT imaging of the chest, abdomen and pelvis was performed following the standard protocol during bolus administration of intravenous contrast. RADIATION DOSE REDUCTION: This exam was performed according to the departmental dose-optimization program which includes automated exposure control, adjustment of the mA and/or kV according to patient  size and/or use of iterative reconstruction technique. CONTRAST:  19m OMNIPAQUE IOHEXOL 300 MG/ML  SOLN COMPARISON:  None Available. FINDINGS: CT CHEST FINDINGS Cardiovascular: The heart is normal in size. No pericardial effusion. No evidence of thoracic aortic aneurysm. Mild atherosclerotic calcifications of the arch. Mild three-vessel coronary sclerosis. Mediastinum/Nodes: No suspicious thoracic lymphadenopathy. Visualized thyroid is unremarkable. Lungs/Pleura: Mild subpleural reticulation/fibrosis in the lungs bilaterally, lower lobe predominant, favoring mild chronic interstitial lung disease. Superimposed mild dependent atelectasis in the bilateral lower lobes. No suspicious pulmonary nodules. No focal consolidation. No pleural effusion or pneumothorax. Musculoskeletal: Degenerative changes of the thoracic spine. CT ABDOMEN PELVIS FINDINGS Hepatobiliary: Liver is within normal limits. Gallbladder is unremarkable. No intrahepatic or extrahepatic ductal dilatation. Pancreas: Within normal  limits. Spleen: Calcified splenic granulomata. Adrenals/Urinary Tract: Adrenal glands are within normal limits. 5 mm right upper pole renal cyst (series 3/image 61). Left kidney is within normal limits. No hydronephrosis. Bladder is within normal limits. Stomach/Bowel: Stomach is within normal limits. No evidence of bowel obstruction. Appendix is not discretely visualized. Sigmoid diverticulosis, without evidence of diverticulitis. Vascular/Lymphatic: No evidence of abdominal aortic aneurysm. Atherosclerotic calcifications of the abdominal aorta and branch vessels. No suspicious abdominopelvic lymphadenopathy. Reproductive: Uterus is within normal limits. No adnexal masses. Other: No abdominopelvic ascites. Musculoskeletal: Degenerative changes of the lumbar spine. IMPRESSION: No findings suspicious for metastatic disease in the chest, abdomen, or pelvis. Additional ancillary findings as above. Electronically Signed   By: SJulian HyM.D.   On: 08/28/2021 01:10    Microbiology: Results for orders placed or performed during the hospital encounter of 08/27/21  Resp Panel by RT-PCR (Flu A&B, Covid) Nasopharyngeal Swab     Status: None   Collection Time: 08/27/21  4:20 PM   Specimen: Nasopharyngeal Swab; Nasopharyngeal(NP) swabs in vial transport medium  Result Value Ref Range Status   SARS Coronavirus 2 by RT PCR NEGATIVE NEGATIVE Final    Comment: (NOTE) SARS-CoV-2 target nucleic acids are NOT DETECTED.  The SARS-CoV-2 RNA is generally detectable in upper respiratory specimens during the acute phase of infection. The lowest concentration of SARS-CoV-2 viral copies this assay can detect is 138 copies/mL. A negative result does not preclude SARS-Cov-2 infection and should not be used as the sole basis for treatment or other patient management decisions. A negative result may occur with  improper specimen collection/handling, submission of specimen other than nasopharyngeal swab, presence of  viral mutation(s) within the areas targeted by this assay, and inadequate number of viral copies(<138 copies/mL). A negative result must be combined with clinical observations, patient history, and epidemiological information. The expected result is Negative.  Fact Sheet for Patients:  hEntrepreneurPulse.com.au Fact Sheet for Healthcare Providers:  hIncredibleEmployment.be This test is no t yet approved or cleared by the [place] and  has been authorized for detection and/or diagnosis of SARS-CoV-2 by FDA under an Emergency Use Authorization (EUA). This EUA will remain  in effect (meaning this test can be used) for the duration of the COVID-19 declaration under Section 564(b)(1) of the Act, 21 U.S.C.section 360bbb-3(b)(1), unless the authorization is terminated  or revoked sooner.       Influenza A by PCR NEGATIVE NEGATIVE Final   Influenza B by PCR NEGATIVE NEGATIVE Final    Comment: (NOTE) The Xpert Xpress SARS-CoV-2/FLU/RSV plus assay is intended as an aid in the diagnosis of influenza from Nasopharyngeal swab specimens and should not be used as a sole basis for treatment. Nasal washings and aspirates are unacceptable for Xpert  Xpress SARS-CoV-2/FLU/RSV testing.  Fact Sheet for Patients: EntrepreneurPulse.com.au  Fact Sheet for Healthcare Providers: IncredibleEmployment.be  This test is not yet approved or cleared by the [place] FDA and has been authorized for detection and/or diagnosis of SARS-CoV-2 by FDA under an Emergency Use Authorization (EUA). This EUA will remain in effect (meaning this test can be used) for the duration of the COVID-19 declaration under Section 564(b)(1) of the Act, 21 U.S.C. section 360bbb-3(b)(1), unless the authorization is terminated or revoked.  Performed at [facility], [place] [address], [place], [place] [address]     Labs: CBC: Recent Labs   Lab 08/27/21 1251 08/27/21 1314 08/28/21 0417  WBC 8.3  --  8.6  NEUTROABS 5.8  --   --   HGB 13.6 13.9 13.0  HCT 41.0 41.0 37.1  MCV 94.7  --  90.9  PLT 189  --  491   Basic Metabolic Panel: Recent Labs  Lab 08/27/21 1251 08/27/21 1314 08/28/21 0417  NA 139 138 135  K 4.2 4.2 3.5  CL 101 104 104  CO2 28  --  26  GLUCOSE 105* 103* 133*  BUN '21 23 21  '$ CREATININE 0.79 0.80 0.82  CALCIUM 9.7  --  9.2   Liver Function Tests: Recent Labs  Lab 08/27/21 1251 08/28/21 0417  AST 38 36  ALT 28 30  ALKPHOS 43 42  BILITOT 1.2 0.7  PROT 7.1 6.5  ALBUMIN 3.9 3.3*   CBG: No results for input(s): GLUCAP in the last 168 hours.  Discharge time spent: approximately 50 minutes spent on discharge counseling, evaluation of patient on day of discharge, and coordination of discharge planning with nursing, social work, pharmacy and case management  Signed: Edwin Dada, MD [facility] 08/28/2021

## 2021-09-01 ENCOUNTER — Other Ambulatory Visit: Payer: Self-pay

## 2021-09-01 ENCOUNTER — Other Ambulatory Visit: Payer: Self-pay | Admitting: Neurosurgery

## 2021-09-01 ENCOUNTER — Inpatient Hospital Stay: Payer: Medicare PPO

## 2021-09-01 ENCOUNTER — Encounter (HOSPITAL_COMMUNITY): Payer: Self-pay | Admitting: Orthopedic Surgery

## 2021-09-01 NOTE — Pre-Procedure Instructions (Signed)
[id number] [place], [place] - [address] [place] [address]Phone: [phone]: [phone]  PCP - MUnk Pinto MD  EKG - 08/28/21  ERAS Protcol - Clears until 1OntonTEST- N  Anesthesia review: N  Patient verbally denies any shortness of breath, fever, cough and chest pain during phone call   -------------  SDW INSTRUCTIONS given:  Your procedure is scheduled on 09/02/21.  [facility].M., and check in at the Admitting office.  Call this number if you have problems the morning of surgery:  [phone]   Remember:  Do not eat after midnight the night before your surgery  You may drink clear liquids until 1100 the morning of your surgery.   Clear liquids allowed are: Water, Non-Citrus Juices (without pulp), Carbonated Beverages, Clear Tea, Black Coffee Only, and Gatorade    Take these medicines the morning of surgery with A SIP OF WATER  liothyronine (CYTOMEL) acetaminophen (TYLENOL)-if needed meclizine (ANTIVERT)-if needed    As of today, STOP taking any Aspirin (unless otherwise instructed by your surgeon) Aleve, Naproxen, Ibuprofen, Motrin, Advil, Goody's, BC's, all herbal medications, fish oil, and all vitamins.                      Do not wear jewelry, make up, or nail polish            Do not wear lotions, powders, perfumes/colognes, or deodorant.            Do not shave 48 hours prior to surgery.  Men may shave face and neck.            Do not bring valuables to the hospital.            [facility] or [facility].  Do NOT Smoke (Tobacco/Vaping) 24 hours prior to your procedure If you use a CPAP at night, you may bring all equipment for your overnight stay.   Contacts, glasses, dentures or bridgework may not be worn into surgery.      For patients admitted to the hospital, discharge time will be  determined by your treatment team.   Patients discharged the day of surgery will not be allowed to drive home, and someone needs to stay with them for 24 hours.    Special instructions:   [facility]- Preparing For Surgery  Before surgery, you can play an important role. Because skin is not sterile, your skin needs to be as free of germs as possible. You can reduce the number of germs on your skin by washing with CHG (chlorahexidine gluconate) Soap before surgery.  CHG is an antiseptic cleaner which kills germs and bonds with the skin to continue killing germs even after washing.    Oral Hygiene is also important to reduce your risk of infection.  Remember - BRUSH YOUR TEETH THE MORNING OF SURGERY WITH YOUR REGULAR TOOTHPASTE  Please do not use if you have an allergy to CHG or antibacterial soaps. If your skin becomes reddened/irritated stop using the CHG.  Do not shave (including legs and underarms) for at least 48 hours prior to first CHG shower. It is OK to shave your face.  Please follow these instructions carefully.   Shower the NIGHT BEFORE SURGERY and the MORNING OF SURGERY with DIAL Soap.   Pat yourself dry with  a CLEAN TOWEL.  Wear CLEAN PAJAMAS to bed the night before surgery  Place CLEAN SHEETS on your bed the night of your first shower and DO NOT SLEEP WITH PETS.   Day of Surgery: Please shower morning of surgery  Wear Clean/Comfortable clothing the morning of surgery Do not apply any deodorants/lotions.   Remember to brush your teeth WITH YOUR REGULAR TOOTHPASTE.   Questions were answered. Patient verbalized understanding of instructions.

## 2021-09-01 NOTE — H&P (Signed)
CC:  brain mass   HPI:        Patient is a 86 y.o. female presents with progressive dizziness, imbalance, and left sided apraxia who was found to have an infiltrative extensive right sided brain mass.  She was started on dexamethasone which helped her symptoms somewhat. Metastatic workup with CT chest/abd/pelvis was negative.  My partner Dr. Ronnald Ramp discussed with the patient possibility of biopsy with likely subsequent chemotherapy and/or radiation.  Patient declined further treatment so she was referred to hospice. However, after talking with friends and family, patient wished to discuss the possibility of treatment further.           Patient Active Problem List    Diagnosis Date Noted   Brain tumor ([place]) 08/28/2021   Right frontal lobe mass 08/27/2021   Neoplasm of brain causing mass effect and brain compression on adjacent structures ([place]) 08/27/2021   Age-related osteoporosis without current pathological fracture 10/10/2020   CKD (chronic kidney disease) stage 2, GFR 60-89 ml/min 03/29/2020   History of basal cell cancer 03/17/2018   Vitamin D deficiency 08/28/2014   Medication management 08/28/2014   Hypothyroidism 08/28/2014   Hypertension 08/25/2013   Hyperlipidemia 08/25/2013   Migraines 08/25/2013   Rosacea 08/25/2013        Past Medical History:  Diagnosis Date   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 Herbert Deaner   RADIAL HEAD ARTHROPLASTY Right 09/16/2012    Procedure: RADIAL HEAD ARTHROPLASTY;  Surgeon: Johnny Bridge, MD;  Location: [place];  Service: Orthopedics;  Laterality: Right;  RADIAL HEAD REPLACEMENT              Medications Prior to Admission   Medication Sig Dispense Refill Last Dose   acetaminophen (TYLENOL) 325 MG tablet Take 650 mg by mouth every 6 (six) hours as needed (for pain).     Past Week   aspirin EC 325 MG tablet Take 650 mg by mouth daily as needed (for pain).     08/26/2021 at 2200   BIOTIN PO Take 1 tablet by mouth daily.     Past Week   Calcium-Magnesium-Vitamin D (CALCIUM 1200+D3 PO) Take 1 tablet by mouth daily.     Past Week   Cyanocobalamin (B-12 PO) Place 1 tablet under the tongue in the morning.     08/26/2021 at am   Digestive Enzymes (BETAINE HCL PO) Take 1 capsule by mouth in the morning. Ortho Biotic     08/26/2021 at am   Ergocalciferol (VITAMIN D2) 2000 UNITS TABS Take 2,000 Units by mouth daily.     08/26/2021   erythromycin with ethanol (THERAMYCIN) 2 % external solution Apply topically daily. (Patient taking differently: Apply 1 application. topically daily as needed (to insect bites).) 60 mL 1 unk   fluticasone (CUTIVATE) 0.05 % cream Apply 1 application. topically 2 (two) times daily as needed (for itching).     unk   liothyronine (CYTOMEL) 5 MCG tablet Take 2 tablets (10 mcg total) by mouth 2 (two) times daily. (Patient taking differently: Take 10 mcg by mouth See admin instructions. Take  10 mcg by mouth before breakfast and lunch) 360 tablet 3 08/27/2021 at am   Magnesium 500 MG CAPS Take 500 mg by mouth daily.     08/26/2021   meclizine (ANTIVERT) 25 MG tablet 1/2-1 pill up to 3 times daily for motion sickness/dizziness (Patient taking differently: Take 25 mg by mouth 3 (three) times daily as needed for dizziness.) 30 tablet 0 unk   Misc Natural Products (OSTEO BI-FLEX JOINT SHIELD PO) Take 1 tablet by mouth at bedtime. Osteo Bi-Flex     08/26/2021 at pm   naproxen sodium (ALEVE) 220 MG tablet Take 220-440 mg by mouth 2 (two) times daily as needed (for pain).     Past Week   NEOMYCIN-POLYMYXIN-HYDROCORTISONE (CORTISPORIN) 1 % SOLN OTIC solution Place 6 drops into both ears 4 (four) times daily. (Patient taking  differently: Place 6 drops into the right ear daily as needed ("for pain").) 10 mL 3 unk   NON FORMULARY Apply 1 application. topically See admin instructions. CBD cream- Apply topically as needed for pain relief     unk   NON FORMULARY Take 1 capsule by mouth See admin instructions. Pure Encapsulations P5P 50  Vitamin B6 Supplement- Take 1 capsule by mouth once a day     08/26/2021   olmesartan (BENICAR) 20 MG tablet Take 1/2 tablet  Daily  for BP (Patient taking differently: Take 10 mg by mouth daily.) 45 tablet 3 08/26/2021   Omega-3 Fatty Acids (FISH OIL PO) Take 1 capsule by mouth in the morning.     08/26/2021   Taurine 1000 MG CAPS Take 1,000 mg by mouth daily.     08/26/2021   tretinoin (RETIN-A) 0.05 % cream Apply topically at bedtime. (Patient taking differently: Apply 1 application. topically every Monday, Wednesday, and Friday at 8 PM.) 45 g 0 Past Week   Zinc 50 MG CAPS Take 50 mg by mouth daily.     08/26/2021   clotrimazole-betamethasone (LOTRISONE) cream Apply 1 application topically 2 (two) times daily. (Patient not taking: Reported on 08/27/2021) 15 g 2 Not Taking   fexofenadine (ALLEGRA) 180 MG tablet Take 1 tablet (180 mg total) by mouth daily. (Patient not taking: Reported on 08/27/2021)     Not Taking   L-Methylfolate-Algae-B12-B6 (METANX PO) Take by mouth daily.     unk         Allergies  Allergen Reactions   Barbiturates Other (See Comments)      Reaction not cited   Other Other (See Comments)      Patient has AIP and is not to have hormones or red wine   Pentothal [Thiopental] Other (See Comments)      Reaction not cited   Sulfa Antibiotics Other (See Comments)      Reaction not cited   Wheat Bran Nausea And Vomiting    Social History         Tobacco Use   Smoking status: Former      Packs/day: 2.00      Years: 22.00      Pack years: 44.00      Types: Cigarettes      Quit date: 09/17/1983      Years since quitting: 37.9   Smokeless tobacco: Never  Substance Use  Topics   Alcohol use: Not Currently      Alcohol/week: 0.0 - 1.0 standard drinks      Comment: 2/month         Family History  Problem Relation Age of Onset  Heart disease Mother     Brain cancer Father 70        Brain tumor, not cancerous   Hypertension Sister     Osteoporosis Sister     Arthritis Sister     Scoliosis Sister     Hypertension Sister     Osteoporosis Sister     Cancer Paternal Grandfather     Suicidality Paternal Grandfather     Breast cancer Neg Hx        Review of Systems Pertinent items are noted in HPI.   Objective:    Patient Vitals for the past 8 hrs:   BP Temp Temp src Pulse Resp SpO2  08/28/21 1522 (!) 159/65 98.1 F (36.7 C) Oral (!) 53 18 99 %  08/28/21 1140 (!) 141/59 97.9 F (36.6 C) Oral (!) 56 18 96 %  08/28/21 0745 132/63 98.4 F (36.9 C) Oral (!) 59 -- 96 %    No intake/output data recorded. No intake/output data recorded.          General : Alert, cooperative, no distress, appears stated age   Head:  Normocephalic/atraumatic    Eyes: PERRL, conjunctiva/corneas clear, EOM's intact. Fundi could not be visualized Neck: Supple Chest:  Respirations unlabored Chest wall: no tenderness or deformity Heart: Regular rate and rhythm Abdomen: Soft, nontender and nondistended Extremities: warm and well-perfused Skin: normal turgor, color and texture Neurologic:  Alert, oriented x 3.  Eyes open spontaneously. PERRL, EOMI, VFC, no facial droop. V1-3 intact.  No dysarthria, tongue protrusion symmetric.  CNII-XII intact. L pronator drift.  + Romberg's.  Occasional inattention noted.           Data ReviewCBC:  Labs (Brief)       Lab Results  Component Value Date    WBC 8.6 08/28/2021    RBC 4.08 08/28/2021      BMP:  Labs (Brief)       Lab Results  Component Value Date    GLUCOSE 133 (H) 08/28/2021    CO2 26 08/28/2021    BUN 21 08/28/2021    CREATININE 0.82 08/28/2021    CREATININE 0.72 04/01/2021    CALCIUM 9.2 08/28/2021       Radiology review:    Previous CT head and MRI brain with and without contrast on 5/17 was reviewed.  There is an infiltrative homogeneously enhancing irregular mass centered mostly in the right basal ganglia and insular region.  There is separate lesion in the right frontal pole.  There is associated vasogenic edema.   Assessment:    Principal Problem:   Neoplasm of brain causing mass effect and brain compression on adjacent structures [facility]) Active Problems:   Hypertension   CKD (chronic kidney disease) stage 2, GFR 60-89 ml/min   Brain tumor [facility])   This is a 86 year old woman with an enhancing brain mass likely representing a primary malignant brain tumor.  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  GBM would be less likely, and rare inflammatory or infectious disorder appears very unlikely.   Plan:  -I had a long discussion with the patient and her family at the bedside.  I discussed the differential diagnosis.  With my high suspicion for lymphoma, I discussed the possibility of chemotherapy with methotrexate or another agent.  I explained that especially with her age, cure is not likely and it is unclear if good control of the disease can be achieved.  If she is fairly certain  she does not wish to pursue chemotherapy or radiation in the face of poor prognosis, it would not make sense to pursue biopsy.  However, patient and the family thinks they wish to try treatment. As such, biopsy of the lesion is indicated to determine proper treatment. The general technique of surgery, as well as risk, benefits, alternatives, and expected convalescence were discussed with her.  Risk discussed included, but were not limited to, bleeding, pain, infection, nondiagnostic sample, neurologic deficit, and death.  Informed consent was obtained.  All questions and concerns were answered.  She verbalized understanding and agreement with the plan.

## 2021-09-02 ENCOUNTER — Encounter (HOSPITAL_COMMUNITY)
Admission: RE | Disposition: A | Discharge status: Home / Self Care | Payer: Self-pay | Source: Home / Self Care | Attending: Neurosurgery

## 2021-09-02 ENCOUNTER — Inpatient Hospital Stay (HOSPITAL_COMMUNITY)
Admission: RE | Admit: 2021-09-02 | Discharge: 2021-09-03 | DRG: 820 | Disposition: A | Discharge status: Home / Self Care | Payer: Medicare PPO | Attending: Neurosurgery | Admitting: Neurosurgery

## 2021-09-02 ENCOUNTER — Inpatient Hospital Stay (HOSPITAL_COMMUNITY): Payer: Medicare PPO | Admitting: Certified Registered Nurse Anesthetist

## 2021-09-02 ENCOUNTER — Other Ambulatory Visit: Payer: Self-pay

## 2021-09-02 ENCOUNTER — Encounter (HOSPITAL_COMMUNITY): Payer: Self-pay

## 2021-09-02 DIAGNOSIS — Z87891 Personal history of nicotine dependence: Secondary | ICD-10-CM | POA: Diagnosis not present

## 2021-09-02 DIAGNOSIS — N289 Disorder of kidney and ureter, unspecified: Secondary | ICD-10-CM | POA: Diagnosis not present

## 2021-09-02 DIAGNOSIS — G936 Cerebral edema: Secondary | ICD-10-CM | POA: Diagnosis not present

## 2021-09-02 DIAGNOSIS — Z8262 Family history of osteoporosis: Secondary | ICD-10-CM | POA: Diagnosis not present

## 2021-09-02 DIAGNOSIS — Z91018 Allergy to other foods: Secondary | ICD-10-CM | POA: Diagnosis not present

## 2021-09-02 DIAGNOSIS — I619 Nontraumatic intracerebral hemorrhage, unspecified: Secondary | ICD-10-CM | POA: Diagnosis not present

## 2021-09-02 DIAGNOSIS — E785 Hyperlipidemia, unspecified: Secondary | ICD-10-CM | POA: Diagnosis present

## 2021-09-02 DIAGNOSIS — C8331 Diffuse large B-cell lymphoma, lymph nodes of head, face, and neck: Secondary | ICD-10-CM | POA: Diagnosis not present

## 2021-09-02 DIAGNOSIS — Z8616 Personal history of COVID-19: Secondary | ICD-10-CM | POA: Diagnosis not present

## 2021-09-02 DIAGNOSIS — Z96698 Presence of other orthopedic joint implants: Secondary | ICD-10-CM | POA: Diagnosis present

## 2021-09-02 DIAGNOSIS — I129 Hypertensive chronic kidney disease with stage 1 through stage 4 chronic kidney disease, or unspecified chronic kidney disease: Secondary | ICD-10-CM | POA: Diagnosis not present

## 2021-09-02 DIAGNOSIS — Z808 Family history of malignant neoplasm of other organs or systems: Secondary | ICD-10-CM

## 2021-09-02 DIAGNOSIS — K219 Gastro-esophageal reflux disease without esophagitis: Secondary | ICD-10-CM | POA: Diagnosis present

## 2021-09-02 DIAGNOSIS — C8339 Primary central nervous system lymphoma: Secondary | ICD-10-CM | POA: Diagnosis present

## 2021-09-02 DIAGNOSIS — Z882 Allergy status to sulfonamides status: Secondary | ICD-10-CM

## 2021-09-02 DIAGNOSIS — G9389 Other specified disorders of brain: Secondary | ICD-10-CM

## 2021-09-02 DIAGNOSIS — D496 Neoplasm of unspecified behavior of brain: Secondary | ICD-10-CM | POA: Diagnosis present

## 2021-09-02 DIAGNOSIS — Z7982 Long term (current) use of aspirin: Secondary | ICD-10-CM

## 2021-09-02 DIAGNOSIS — G935 Compression of brain: Secondary | ICD-10-CM | POA: Diagnosis present

## 2021-09-02 DIAGNOSIS — Z8249 Family history of ischemic heart disease and other diseases of the circulatory system: Secondary | ICD-10-CM | POA: Diagnosis not present

## 2021-09-02 DIAGNOSIS — Z8261 Family history of arthritis: Secondary | ICD-10-CM | POA: Diagnosis not present

## 2021-09-02 DIAGNOSIS — I10 Essential (primary) hypertension: Secondary | ICD-10-CM | POA: Diagnosis not present

## 2021-09-02 DIAGNOSIS — N182 Chronic kidney disease, stage 2 (mild): Secondary | ICD-10-CM | POA: Diagnosis not present

## 2021-09-02 DIAGNOSIS — Z79899 Other long term (current) drug therapy: Secondary | ICD-10-CM

## 2021-09-02 DIAGNOSIS — Z9889 Other specified postprocedural states: Principal | ICD-10-CM

## 2021-09-02 DIAGNOSIS — G939 Disorder of brain, unspecified: Secondary | ICD-10-CM | POA: Diagnosis not present

## 2021-09-02 DIAGNOSIS — C8589 Other specified types of non-Hodgkin lymphoma, extranodal and solid organ sites: Secondary | ICD-10-CM | POA: Diagnosis present

## 2021-09-02 DIAGNOSIS — E039 Hypothyroidism, unspecified: Secondary | ICD-10-CM | POA: Diagnosis not present

## 2021-09-02 DIAGNOSIS — Z85828 Personal history of other malignant neoplasm of skin: Secondary | ICD-10-CM

## 2021-09-02 DIAGNOSIS — R220 Localized swelling, mass and lump, head: Secondary | ICD-10-CM | POA: Diagnosis not present

## 2021-09-02 DIAGNOSIS — C719 Malignant neoplasm of brain, unspecified: Secondary | ICD-10-CM | POA: Diagnosis not present

## 2021-09-02 HISTORY — DX: Nausea with vomiting, unspecified: R11.2

## 2021-09-02 HISTORY — PX: APPLICATION OF CRANIAL NAVIGATION: SHX6578

## 2021-09-02 HISTORY — PX: FRAMELESS  BIOPSY WITH BRAINLAB: SHX6879

## 2021-09-02 HISTORY — DX: Other specified postprocedural states: Z98.890

## 2021-09-02 LAB — MRSA NEXT GEN BY PCR, NASAL: MRSA by PCR Next Gen: NOT DETECTED

## 2021-09-02 LAB — TYPE AND SCREEN
ABO/RH(D): A POS
Antibody Screen: NEGATIVE

## 2021-09-02 LAB — ABO/RH: ABO/RH(D): A POS

## 2021-09-02 SURGERY — FRAMELESS BIOPSY WITH BRAINLAB
Anesthesia: General | Laterality: Right

## 2021-09-02 MED ORDER — ONDANSETRON HCL 4 MG/2ML IJ SOLN
INTRAMUSCULAR | Status: DC | PRN
Start: 2021-09-02 — End: 2021-09-02
  Administered 2021-09-02: 4 mg via INTRAVENOUS

## 2021-09-02 MED ORDER — ONDANSETRON HCL 4 MG/2ML IJ SOLN: 4.0000 mg | INTRAMUSCULAR | Status: DC | PRN

## 2021-09-02 MED ORDER — LIDOCAINE-EPINEPHRINE 1 %-1:100000 IJ SOLN
INTRAMUSCULAR | Status: AC
  Filled 2021-09-02: qty 1

## 2021-09-02 MED ORDER — METANX 3-90.314-2-35 MG PO CAPS
ORAL_CAPSULE | Freq: Every day | ORAL | Status: DC
Start: 2021-09-02 — End: 2021-09-02

## 2021-09-02 MED ORDER — BACITRACIN ZINC 500 UNIT/GM EX OINT
TOPICAL_OINTMENT | CUTANEOUS | Status: DC | PRN
  Administered 2021-09-02: 1 via TOPICAL

## 2021-09-02 MED ORDER — LEVETIRACETAM 500 MG PO TABS
500.0000 mg | ORAL_TABLET | Freq: Two times a day (BID) | ORAL | Status: DC
  Administered 2021-09-02 – 2021-09-03 (×2): 500 mg via ORAL
  Filled 2021-09-02 (×2): qty 1

## 2021-09-02 MED ORDER — IRBESARTAN 150 MG PO TABS
150.0000 mg | ORAL_TABLET | Freq: Every day | ORAL | Status: DC
  Administered 2021-09-03: 150 mg via ORAL
  Filled 2021-09-02: qty 1

## 2021-09-02 MED ORDER — CEFAZOLIN SODIUM-DEXTROSE 2-4 GM/100ML-% IV SOLN
INTRAVENOUS | Status: AC
  Filled 2021-09-02: qty 100

## 2021-09-02 MED ORDER — ACETAMINOPHEN 650 MG RE SUPP: 650.0000 mg | RECTAL | Status: DC | PRN

## 2021-09-02 MED ORDER — ENALAPRILAT 1.25 MG/ML IV SOLN: 1.2500 mg | Freq: Four times a day (QID) | INTRAVENOUS | Status: DC | PRN

## 2021-09-02 MED ORDER — CLOTRIMAZOLE 1 % EX CREA
TOPICAL_CREAM | Freq: Two times a day (BID) | CUTANEOUS | Status: DC
Start: 2021-09-02 — End: 2021-09-02

## 2021-09-02 MED ORDER — SODIUM CHLORIDE 0.9 % IV SOLN: INTRAVENOUS | Status: DC | PRN

## 2021-09-02 MED ORDER — ZINC 50 MG PO CAPS: 50.0000 mg | ORAL_CAPSULE | Freq: Every day | ORAL | Status: DC

## 2021-09-02 MED ORDER — THROMBIN 5000 UNITS EX SOLR
CUTANEOUS | Status: DC | PRN
  Administered 2021-09-02: 5000 [IU] via TOPICAL

## 2021-09-02 MED ORDER — POLYETHYLENE GLYCOL 3350 17 G PO PACK: 17.0000 g | PACK | Freq: Every day | ORAL | Status: DC | PRN

## 2021-09-02 MED ORDER — ONDANSETRON HCL 4 MG PO TABS: 4.0000 mg | ORAL_TABLET | ORAL | Status: DC | PRN

## 2021-09-02 MED ORDER — CHLORHEXIDINE GLUCONATE CLOTH 2 % EX PADS: 6.0000 | MEDICATED_PAD | Freq: Once | CUTANEOUS | Status: DC

## 2021-09-02 MED ORDER — CHLORHEXIDINE GLUCONATE CLOTH 2 % EX PADS
6.0000 | MEDICATED_PAD | Freq: Once | CUTANEOUS | Status: DC
  Administered 2021-09-02: 6 via TOPICAL

## 2021-09-02 MED ORDER — THROMBIN 5000 UNITS EX SOLR
CUTANEOUS | Status: AC
  Filled 2021-09-02: qty 15000

## 2021-09-02 MED ORDER — LABETALOL HCL 5 MG/ML IV SOLN: 10.0000 mg | INTRAVENOUS | Status: DC | PRN

## 2021-09-02 MED ORDER — ORAL CARE MOUTH RINSE: 15.0000 mL | Freq: Once | OROMUCOSAL | Status: AC

## 2021-09-02 MED ORDER — ROCURONIUM BROMIDE 10 MG/ML (PF) SYRINGE
PREFILLED_SYRINGE | INTRAVENOUS | Status: DC | PRN
Start: 2021-09-02 — End: 2021-09-02
  Administered 2021-09-02: 30 mg via INTRAVENOUS
  Administered 2021-09-02: 50 mg via INTRAVENOUS
  Administered 2021-09-02: 20 mg via INTRAVENOUS
  Administered 2021-09-02: 10 mg via INTRAVENOUS

## 2021-09-02 MED ORDER — CHLORHEXIDINE GLUCONATE 0.12 % MT SOLN: 15.0000 mL | Freq: Once | OROMUCOSAL | Status: AC

## 2021-09-02 MED ORDER — HYDRALAZINE HCL 20 MG/ML IJ SOLN
10.0000 mg | Freq: Once | INTRAMUSCULAR | Status: AC
  Administered 2021-09-02: 10 mg via INTRAVENOUS

## 2021-09-02 MED ORDER — FENTANYL CITRATE (PF) 100 MCG/2ML IJ SOLN: 25.0000 ug | INTRAMUSCULAR | Status: DC | PRN

## 2021-09-02 MED ORDER — LIDOCAINE 2% (20 MG/ML) 5 ML SYRINGE
INTRAMUSCULAR | Status: AC
  Filled 2021-09-02: qty 5

## 2021-09-02 MED ORDER — CHLORHEXIDINE GLUCONATE 0.12 % MT SOLN
OROMUCOSAL | Status: AC
  Administered 2021-09-02: 15 mL via OROMUCOSAL
  Filled 2021-09-02: qty 15

## 2021-09-02 MED ORDER — ACETAMINOPHEN 500 MG PO TABS: 1000.0000 mg | ORAL_TABLET | Freq: Once | ORAL | Status: AC

## 2021-09-02 MED ORDER — CEFAZOLIN SODIUM-DEXTROSE 2-3 GM-%(50ML) IV SOLR
INTRAVENOUS | Status: DC | PRN
  Administered 2021-09-02: 2 g via INTRAVENOUS

## 2021-09-02 MED ORDER — VITAMIN D2 50 MCG (2000 UT) PO TABS: 2000.0000 [IU] | ORAL_TABLET | Freq: Every day | ORAL | Status: DC

## 2021-09-02 MED ORDER — 0.9 % SODIUM CHLORIDE (POUR BTL) OPTIME
TOPICAL | Status: DC | PRN
  Administered 2021-09-02: 1000 mL

## 2021-09-02 MED ORDER — FENTANYL CITRATE (PF) 100 MCG/2ML IJ SOLN
INTRAMUSCULAR | Status: AC
  Filled 2021-09-02: qty 2

## 2021-09-02 MED ORDER — FENTANYL CITRATE (PF) 250 MCG/5ML IJ SOLN
INTRAMUSCULAR | Status: DC | PRN
  Administered 2021-09-02: 100 ug via INTRAVENOUS
  Administered 2021-09-02: 50 ug via INTRAVENOUS

## 2021-09-02 MED ORDER — MORPHINE SULFATE (PF) 2 MG/ML IV SOLN: 1.0000 mg | INTRAVENOUS | Status: DC | PRN

## 2021-09-02 MED ORDER — DEXAMETHASONE 0.5 MG PO TABS: 1.0000 mg | ORAL_TABLET | Freq: Two times a day (BID) | ORAL | Status: DC

## 2021-09-02 MED ORDER — DEXAMETHASONE 0.5 MG PO TABS: 2.0000 mg | ORAL_TABLET | Freq: Two times a day (BID) | ORAL | Status: DC

## 2021-09-02 MED ORDER — ACETAMINOPHEN 325 MG PO TABS
650.0000 mg | ORAL_TABLET | ORAL | Status: DC | PRN
  Administered 2021-09-02 – 2021-09-03 (×2): 650 mg via ORAL
  Filled 2021-09-02 (×2): qty 2

## 2021-09-02 MED ORDER — LIDOCAINE 2% (20 MG/ML) 5 ML SYRINGE
INTRAMUSCULAR | Status: DC | PRN
  Administered 2021-09-02: 80 mg via INTRAVENOUS

## 2021-09-02 MED ORDER — CEFAZOLIN SODIUM-DEXTROSE 2-4 GM/100ML-% IV SOLN: 2.0000 g | INTRAVENOUS | Status: DC

## 2021-09-02 MED ORDER — LIOTHYRONINE SODIUM 5 MCG PO TABS
10.0000 ug | ORAL_TABLET | Freq: Two times a day (BID) | ORAL | Status: DC
  Administered 2021-09-03: 10 ug via ORAL
  Filled 2021-09-02 (×2): qty 2

## 2021-09-02 MED ORDER — FLUTICASONE PROPIONATE 0.05 % EX CREA: 1.0000 "application " | TOPICAL_CREAM | Freq: Two times a day (BID) | CUTANEOUS | Status: DC | PRN

## 2021-09-02 MED ORDER — ROCURONIUM BROMIDE 10 MG/ML (PF) SYRINGE
PREFILLED_SYRINGE | INTRAVENOUS | Status: AC
  Filled 2021-09-02: qty 10

## 2021-09-02 MED ORDER — LORATADINE 10 MG PO TABS
10.0000 mg | ORAL_TABLET | Freq: Every day | ORAL | Status: DC
  Administered 2021-09-03: 10 mg via ORAL
  Filled 2021-09-02: qty 1

## 2021-09-02 MED ORDER — DEXAMETHASONE 4 MG PO TABS: 4.0000 mg | ORAL_TABLET | Freq: Three times a day (TID) | ORAL | Status: DC

## 2021-09-02 MED ORDER — HYDROCODONE-ACETAMINOPHEN 5-325 MG PO TABS
1.0000 | ORAL_TABLET | ORAL | Status: DC | PRN
  Administered 2021-09-02 – 2021-09-03 (×2): 1 via ORAL
  Filled 2021-09-02 (×2): qty 1

## 2021-09-02 MED ORDER — PROMETHAZINE HCL 25 MG PO TABS: 12.5000 mg | ORAL_TABLET | ORAL | Status: DC | PRN

## 2021-09-02 MED ORDER — PHENYLEPHRINE HCL-NACL 20-0.9 MG/250ML-% IV SOLN
INTRAVENOUS | Status: DC | PRN
  Administered 2021-09-02: 20 ug/min via INTRAVENOUS

## 2021-09-02 MED ORDER — DOCUSATE SODIUM 100 MG PO CAPS
100.0000 mg | ORAL_CAPSULE | Freq: Two times a day (BID) | ORAL | Status: DC
  Administered 2021-09-03: 100 mg via ORAL
  Filled 2021-09-02 (×2): qty 1

## 2021-09-02 MED ORDER — NEOMYCIN-POLYMYXIN-HC 1 % OT SOLN: 6.0000 [drp] | Freq: Every day | OTIC | Status: DC | PRN

## 2021-09-02 MED ORDER — B-12 500 MCG PO TABS
ORAL_TABLET | Freq: Every morning | ORAL | Status: DC
Start: 2021-09-03 — End: 2021-09-02

## 2021-09-02 MED ORDER — OXYCODONE HCL 5 MG PO TABS: 5.0000 mg | ORAL_TABLET | Freq: Once | ORAL | Status: DC | PRN

## 2021-09-02 MED ORDER — SUGAMMADEX SODIUM 200 MG/2ML IV SOLN
INTRAVENOUS | Status: DC | PRN
  Administered 2021-09-02: 200 mg via INTRAVENOUS

## 2021-09-02 MED ORDER — DEXAMETHASONE SODIUM PHOSPHATE 10 MG/ML IJ SOLN
INTRAMUSCULAR | Status: DC | PRN
Start: 2021-09-02 — End: 2021-09-02
  Administered 2021-09-02: 10 mg via INTRAVENOUS

## 2021-09-02 MED ORDER — MECLIZINE HCL 25 MG PO TABS: 25.0000 mg | ORAL_TABLET | Freq: Three times a day (TID) | ORAL | Status: DC | PRN

## 2021-09-02 MED ORDER — ACETAMINOPHEN 500 MG PO TABS
ORAL_TABLET | ORAL | Status: AC
  Administered 2021-09-02: 1000 mg via ORAL
  Filled 2021-09-02: qty 2

## 2021-09-02 MED ORDER — OXYCODONE HCL 5 MG/5ML PO SOLN: 5.0000 mg | Freq: Once | ORAL | Status: DC | PRN

## 2021-09-02 MED ORDER — TRETINOIN 0.05 % EX CREA: 1.0000 "application " | TOPICAL_CREAM | CUTANEOUS | Status: DC

## 2021-09-02 MED ORDER — HEMOSTATIC AGENTS (NO CHARGE) OPTIME
TOPICAL | Status: DC | PRN
Start: 2021-09-02 — End: 2021-09-02
  Administered 2021-09-02: 1 via TOPICAL

## 2021-09-02 MED ORDER — PROPOFOL 10 MG/ML IV BOLUS
INTRAVENOUS | Status: AC
  Filled 2021-09-02: qty 20

## 2021-09-02 MED ORDER — DEXAMETHASONE SODIUM PHOSPHATE 4 MG/ML IJ SOLN
4.0000 mg | Freq: Four times a day (QID) | INTRAMUSCULAR | Status: AC
  Administered 2021-09-02 – 2021-09-03 (×3): 4 mg via INTRAVENOUS
  Filled 2021-09-02 (×3): qty 1

## 2021-09-02 MED ORDER — CHLORHEXIDINE GLUCONATE CLOTH 2 % EX PADS: 6.0000 | MEDICATED_PAD | Freq: Every day | CUTANEOUS | Status: DC

## 2021-09-02 MED ORDER — SODIUM CHLORIDE 0.9 % IV SOLN: INTRAVENOUS | Status: DC

## 2021-09-02 MED ORDER — DEXAMETHASONE 0.5 MG PO TABS: 2.0000 mg | ORAL_TABLET | Freq: Three times a day (TID) | ORAL | Status: DC

## 2021-09-02 MED ORDER — BACITRACIN ZINC 500 UNIT/GM EX OINT
TOPICAL_OINTMENT | CUTANEOUS | Status: AC
  Filled 2021-09-02: qty 28.35

## 2021-09-02 MED ORDER — FLEET ENEMA 7-19 GM/118ML RE ENEM: 1.0000 | ENEMA | Freq: Once | RECTAL | Status: DC | PRN

## 2021-09-02 MED ORDER — HYDRALAZINE HCL 20 MG/ML IJ SOLN
INTRAMUSCULAR | Status: AC
  Filled 2021-09-02: qty 1

## 2021-09-02 MED ORDER — PANTOPRAZOLE SODIUM 40 MG PO TBEC
40.0000 mg | DELAYED_RELEASE_TABLET | Freq: Every day | ORAL | Status: DC
Start: 2021-09-02 — End: 2021-09-03
  Administered 2021-09-02 – 2021-09-03 (×2): 40 mg via ORAL
  Filled 2021-09-02 (×2): qty 1

## 2021-09-02 MED ORDER — NICARDIPINE HCL IN NACL 20-0.86 MG/200ML-% IV SOLN
3.0000 mg/h | INTRAVENOUS | Status: DC
  Administered 2021-09-02 (×2): 5 mg/h via INTRAVENOUS
  Filled 2021-09-02 (×2): qty 200

## 2021-09-02 MED ORDER — ONDANSETRON HCL 4 MG/2ML IJ SOLN
INTRAMUSCULAR | Status: AC
  Filled 2021-09-02: qty 2

## 2021-09-02 MED ORDER — CEFAZOLIN SODIUM-DEXTROSE 1-4 GM/50ML-% IV SOLN
1.0000 g | Freq: Three times a day (TID) | INTRAVENOUS | Status: DC
  Administered 2021-09-02 – 2021-09-03 (×2): 1 g via INTRAVENOUS
  Filled 2021-09-02 (×3): qty 50

## 2021-09-02 MED ORDER — TAURINE 1000 MG PO CAPS: 1000.0000 mg | ORAL_CAPSULE | Freq: Every day | ORAL | Status: DC

## 2021-09-02 MED ORDER — LIDOCAINE-EPINEPHRINE 1 %-1:100000 IJ SOLN
INTRAMUSCULAR | Status: DC | PRN
  Administered 2021-09-02: 3 mL

## 2021-09-02 MED ORDER — PROPOFOL 500 MG/50ML IV EMUL
INTRAVENOUS | Status: DC | PRN
  Administered 2021-09-02: 150 ug/kg/min via INTRAVENOUS

## 2021-09-02 MED ORDER — FENTANYL CITRATE (PF) 250 MCG/5ML IJ SOLN
INTRAMUSCULAR | Status: AC
  Filled 2021-09-02: qty 5

## 2021-09-02 MED ORDER — MAGNESIUM OXIDE -MG SUPPLEMENT 400 (240 MG) MG PO TABS
400.0000 mg | ORAL_TABLET | Freq: Every day | ORAL | Status: DC
  Administered 2021-09-03: 400 mg via ORAL
  Filled 2021-09-02: qty 1

## 2021-09-02 MED ORDER — PROPOFOL 10 MG/ML IV BOLUS
INTRAVENOUS | Status: DC | PRN
Start: 2021-09-02 — End: 2021-09-02
  Administered 2021-09-02: 20 mg via INTRAVENOUS
  Administered 2021-09-02: 110 mg via INTRAVENOUS
  Administered 2021-09-02: 30 mg via INTRAVENOUS
  Administered 2021-09-02 (×2): 20 mg via INTRAVENOUS

## 2021-09-02 MED ORDER — DEXAMETHASONE SODIUM PHOSPHATE 10 MG/ML IJ SOLN
INTRAMUSCULAR | Status: AC
  Filled 2021-09-02: qty 1

## 2021-09-02 MED ORDER — ENOXAPARIN SODIUM 40 MG/0.4ML IJ SOSY: 40.0000 mg | PREFILLED_SYRINGE | INTRAMUSCULAR | Status: DC

## 2021-09-02 MED ORDER — DEXAMETHASONE 0.5 MG PO TABS
1.0000 mg | ORAL_TABLET | Freq: Every day | ORAL | Status: DC
Start: 2021-09-15 — End: 2021-09-03

## 2021-09-02 SURGICAL SUPPLY — 80 items
APL SKNCLS STERI-STRIP NONHPOA (GAUZE/BANDAGES/DRESSINGS) ×1
BAG COUNTER SPONGE SURGICOUNT (BAG) ×2 IMPLANT
BAG SPNG CNTER NS LX DISP (BAG) ×1
BAND INSRT 18 STRL LF DISP RB (MISCELLANEOUS) ×2
BAND RUBBER #18 3X1/16 STRL (MISCELLANEOUS) ×4 IMPLANT
BENZOIN TINCTURE PRP APPL 2/3 (GAUZE/BANDAGES/DRESSINGS) ×1 IMPLANT
BIT DRILL LONG 3.0X30 (BIT) IMPLANT
BLADE CLIPPER SURG (BLADE) ×2 IMPLANT
BNDG CMPR 75X41 PLY HI ABS (GAUZE/BANDAGES/DRESSINGS)
BNDG GAUZE ELAST 4 BULKY (GAUZE/BANDAGES/DRESSINGS) IMPLANT
BNDG STRETCH 4X75 STRL LF (GAUZE/BANDAGES/DRESSINGS) IMPLANT
BUR ACORN 9.0 PRECISION (BURR) IMPLANT
BUR ROUND FLUTED 4 SOFT TCH (BURR) IMPLANT
CANISTER SUCT 3000ML PPV (MISCELLANEOUS) ×3 IMPLANT
CNTNR URN SCR LID CUP LEK RST (MISCELLANEOUS) ×1 IMPLANT
CONT SPEC 4OZ STRL OR WHT (MISCELLANEOUS) ×2
COVER MAYO STAND STRL (DRAPES) IMPLANT
DECANTER SPIKE VIAL GLASS SM (MISCELLANEOUS) ×1 IMPLANT
DRAPE HALF SHEET 40X57 (DRAPES) ×2 IMPLANT
DRAPE ORTHO SPLIT 77X108 STRL (DRAPES)
DRAPE SHEET LG 3/4 BI-LAMINATE (DRAPES) ×2 IMPLANT
DRAPE STERI IOBAN 125X83 (DRAPES) IMPLANT
DRAPE SURG 17X23 STRL (DRAPES) IMPLANT
DRAPE SURG ORHT 6 SPLT 77X108 (DRAPES) ×1 IMPLANT
DRAPE WARM FLUID 44X44 (DRAPES) ×2 IMPLANT
DRSG ADAPTIC 3X8 NADH LF (GAUZE/BANDAGES/DRESSINGS) IMPLANT
DRSG OPSITE POSTOP 3X4 (GAUZE/BANDAGES/DRESSINGS) ×1 IMPLANT
DRSG TELFA 3X8 NADH (GAUZE/BANDAGES/DRESSINGS) ×2 IMPLANT
DURAPREP 6ML APPLICATOR 50/CS (WOUND CARE) ×2 IMPLANT
ELECT COATED BLADE 2.86 ST (ELECTRODE) ×2 IMPLANT
ELECT REM PT RETURN 9FT ADLT (ELECTROSURGICAL) ×2
ELECTRODE REM PT RTRN 9FT ADLT (ELECTROSURGICAL) ×1 IMPLANT
GAUZE 4X4 16PLY ~~LOC~~+RFID DBL (SPONGE) ×1 IMPLANT
GAUZE SPONGE 4X4 12PLY STRL (GAUZE/BANDAGES/DRESSINGS) IMPLANT
GLOVE BIO SURGEON STRL SZ7.5 (GLOVE) ×4 IMPLANT
GLOVE BIOGEL PI IND STRL 7.0 (GLOVE) IMPLANT
GLOVE BIOGEL PI IND STRL 7.5 (GLOVE) ×1 IMPLANT
GLOVE BIOGEL PI INDICATOR 7.0 (GLOVE) ×1
GLOVE BIOGEL PI INDICATOR 7.5 (GLOVE) ×2
GLOVE ECLIPSE 7.5 STRL STRAW (GLOVE) ×4 IMPLANT
GLOVE EXAM NITRILE LRG STRL (GLOVE) IMPLANT
GLOVE EXAM NITRILE XS STR PU (GLOVE) IMPLANT
GLOVE SURG ENC MOIS LTX SZ8 (GLOVE) ×4 IMPLANT
GLOVE SURG UNDER POLY LF SZ8.5 (GLOVE) ×4 IMPLANT
GOWN STRL REUS W/ TWL LRG LVL3 (GOWN DISPOSABLE) ×2 IMPLANT
GOWN STRL REUS W/ TWL XL LVL3 (GOWN DISPOSABLE) ×2 IMPLANT
GOWN STRL REUS W/TWL 2XL LVL3 (GOWN DISPOSABLE) IMPLANT
GOWN STRL REUS W/TWL LRG LVL3 (GOWN DISPOSABLE) ×4
GOWN STRL REUS W/TWL XL LVL3 (GOWN DISPOSABLE) ×4
HEMOSTAT SURGICEL 2X14 (HEMOSTASIS) IMPLANT
KIT BASIN OR (CUSTOM PROCEDURE TRAY) ×2 IMPLANT
KIT NDL BIOPSY 1.8X235 CRAN (NEEDLE) ×1 IMPLANT
KIT NEEDLE BIOPSY 1.8X235 CRAN (NEEDLE) ×2 IMPLANT
KIT TURNOVER KIT B (KITS) ×2 IMPLANT
KIT VARIOGUIDE DRILL 1.9 (KITS) ×2 IMPLANT
MARKER SPHERE PSV REFLC 13MM (MARKER) ×5 IMPLANT
NDL SPNL 18GX3.5 QUINCKE PK (NEEDLE) ×1 IMPLANT
NEEDLE HYPO 22GX1.5 SAFETY (NEEDLE) ×2 IMPLANT
NEEDLE SPNL 18GX3.5 QUINCKE PK (NEEDLE) ×2 IMPLANT
NS IRRIG 1000ML POUR BTL (IV SOLUTION) ×6 IMPLANT
PACK CRANIOTOMY CUSTOM (CUSTOM PROCEDURE TRAY) ×2 IMPLANT
PAD DRESSING TELFA 3X8 NADH (GAUZE/BANDAGES/DRESSINGS) ×1 IMPLANT
PIN MAYFIELD SKULL DISP (PIN) ×2 IMPLANT
SPONGE SURGIFOAM ABS GEL SZ50 (HEMOSTASIS) ×2 IMPLANT
STAPLER VISISTAT 35W (STAPLE) ×2 IMPLANT
STRIP CLOSURE SKIN 1/2X4 (GAUZE/BANDAGES/DRESSINGS) ×1 IMPLANT
SUT ETHILON 3 0 FSL (SUTURE) IMPLANT
SUT ETHILON 3 0 PS 1 (SUTURE) IMPLANT
SUT MON AB 3-0 SH 27 (SUTURE)
SUT MON AB 3-0 SH27 (SUTURE) IMPLANT
SUT NURALON 4 0 TR CR/8 (SUTURE) IMPLANT
SUT SILK 0 TIES 10X30 (SUTURE) IMPLANT
SUT VIC AB 2-0 CP2 18 (SUTURE) IMPLANT
SUT VICRYL RAPIDE 3/0 (SUTURE) IMPLANT
SYR 3ML LL SCALE MARK (SYRINGE) ×6 IMPLANT
TOWEL GREEN STERILE (TOWEL DISPOSABLE) ×2 IMPLANT
TOWEL GREEN STERILE FF (TOWEL DISPOSABLE) ×2 IMPLANT
TRAY FOLEY MTR SLVR 16FR STAT (SET/KITS/TRAYS/PACK) ×2 IMPLANT
UNDERPAD 30X36 HEAVY ABSORB (UNDERPADS AND DIAPERS) ×2 IMPLANT
WATER STERILE IRR 1000ML POUR (IV SOLUTION) ×2 IMPLANT

## 2021-09-02 NOTE — Anesthesia Procedure Notes (Signed)
Procedure Name: Intubation Date/Time: 09/02/2021 1:25 PM Performed by: Lavell Luster, CRNA Pre-anesthesia Checklist: Patient identified, Emergency Drugs available, Suction available, Patient being monitored and Timeout performed Patient Re-evaluated:Patient Re-evaluated prior to induction Oxygen Delivery Method: Circle system utilized Preoxygenation: Pre-oxygenation with 100% oxygen Induction Type: IV induction Ventilation: Mask ventilation without difficulty Laryngoscope Size: Mac and 3 Grade View: Grade I Tube type: Oral Tube size: 7.0 mm Number of attempts: 1 Airway Equipment and Method: Stylet Placement Confirmation: ETT inserted through vocal cords under direct vision, positive ETCO2 and breath sounds checked- equal and bilateral Secured at: 22 cm Tube secured with: Tape Dental Injury: Teeth and Oropharynx as per pre-operative assessment

## 2021-09-02 NOTE — Transfer of Care (Signed)
Immediate Anesthesia Transfer of Care Note  Patient: Daisy Oliver  Procedure(s) Performed: FRONTAL STEREOTACTIC BRAIN BIOPSY (Right) APPLICATION OF CRANIAL NAVIGATION (Right)  Patient Location: PACU  Anesthesia Type:General  Level of Consciousness: awake, drowsy and responds to stimulation  Airway & Oxygen Therapy: Patient Spontanous Breathing and Patient connected to face mask oxygen  Post-op Assessment: Report given to RN, Post -op Vital signs reviewed and stable and Patient moving all extremities X 4  Post vital signs: Reviewed and stable  Last Vitals:  Vitals Value Taken Time  BP 153/83 09/02/21 1536  Temp 36.2 C 09/02/21 1535  Pulse 58 09/02/21 1542  Resp 12 09/02/21 1542  SpO2 100 % 09/02/21 1542  Vitals shown include unvalidated device data.  Last Pain:  Vitals:   09/02/21 1205  TempSrc:   PainSc: 4          Complications: No notable events documented.

## 2021-09-02 NOTE — Plan of Care (Signed)
  Problem: Education: Goal: Knowledge of the prescribed therapeutic regimen will improve Outcome: Progressing   Problem: Clinical Measurements: Goal: Usual level of consciousness will be regained or maintained. Outcome: Progressing Goal: Neurologic status will improve Outcome: Progressing Goal: Ability to maintain intracranial pressure will improve Outcome: Progressing   Problem: Skin Integrity: Goal: Demonstration of wound healing without infection will improve Outcome: Progressing   

## 2021-09-02 NOTE — Anesthesia Procedure Notes (Signed)
Arterial Line Insertion Start/End5/23/2023 12:50 PM, 09/02/2021 12:55 PM Performed by: CRNA  Patient location: Pre-op. Preanesthetic checklist: patient identified, IV checked, site marked, risks and benefits discussed, surgical consent, monitors and equipment checked, pre-op evaluation, timeout performed and anesthesia consent Lidocaine 1% used for infiltration Left, radial was placed Catheter size: 20 G Hand hygiene performed  and maximum sterile barriers used   Attempts: 1 Procedure performed without using ultrasound guided technique. Following insertion, Biopatch and dressing applied. Post procedure assessment: normal  Patient tolerated the procedure well with no immediate complications.

## 2021-09-02 NOTE — Anesthesia Preprocedure Evaluation (Addendum)
Anesthesia Evaluation  Patient identified by MRN, date of birth, ID band Patient awake    Reviewed: Allergy & Precautions, NPO status , Patient's Chart, lab work & pertinent test results  History of Anesthesia Complications (+) PONV and history of anesthetic complications  Airway Mallampati: II  TM Distance: >3 FB Neck ROM: Full    Dental no notable dental hx.    Pulmonary former smoker,    Pulmonary exam normal        Cardiovascular hypertension, Pt. on medications Normal cardiovascular exam     Neuro/Psych  Headaches, Large infiltrating mass in the right frontal and temporal lobe extending back towards the parietal lobe. There are areas of nonenhancing edema as well as nodular areas of enhancement. The epicenter appears to be in the right basal ganglia. 8 mm midline shift to the left. Findings most consistent with primary CNS neoplasm. negative psych ROS   GI/Hepatic Neg liver ROS, GERD  Controlled,  Endo/Other  Hypothyroidism   Renal/GU Renal InsufficiencyRenal disease  negative genitourinary   Musculoskeletal negative musculoskeletal ROS (+)   Abdominal   Peds  Hematology Acute intermittent porphyria    Anesthesia Other Findings   Reproductive/Obstetrics negative OB ROS                            Anesthesia Physical Anesthesia Plan  ASA: 4  Anesthesia Plan: General   Post-op Pain Management: Tylenol PO (pre-op)*   Induction: Intravenous  PONV Risk Score and Plan: 4 or greater and Treatment may vary due to age or medical condition, Ondansetron, Propofol infusion, Dexamethasone and TIVA  Airway Management Planned: Oral ETT  Additional Equipment: Arterial line  Intra-op Plan:   Post-operative Plan: Extubation in OR  Informed Consent: I have reviewed the patients History and Physical, chart, labs and discussed the procedure including the risks, benefits and alternatives for the  proposed anesthesia with the patient or authorized representative who has indicated his/her understanding and acceptance.       Plan Discussed with: CRNA  Anesthesia Plan Comments:        Anesthesia Quick Evaluation

## 2021-09-02 NOTE — Op Note (Signed)
Procedure(s): FRONTAL STEREOTACTIC BRAIN BIOPSY APPLICATION OF CRANIAL NAVIGATION Procedure Note  Daisy Oliver female 86 y.o. 09/02/2021  Procedure(s) and Anesthesia Type:    * FRONTAL STEREOTACTIC BRAIN BIOPSY - General    * APPLICATION OF CRANIAL NAVIGATION - General  Surgeon(s) and Role:    Daisy Oliver, Dorcas Carrow, MD - Primary   Indications: This is a 86 year old woman with an enhancing brain mass likely representing a primary malignant brain tumor.  She wished to proceed with biopsy for diagnosis     Surgeon: Vallarie Mare   Anesthesia: General endotracheal anesthesia  ASA Class: none    Procedure Detail  FRONTAL STEREOTACTIC BRAIN BIOPSY, APPLICATION OF CRANIAL NAVIGATION  The patient was brought to the operating room.  A timeout was performed.  General anesthesia was induced and patient was intubated by the anesthesia service.  After appropriate lines and monitors were placed, patient's head was positioned in [place] head holder and affixed to the bed.  Preoperative thin cut MRI was used to construct a 3D image of the scalp and face.  This was used to perform registration with the [place] system.  Trajectory was planned to biopsy the right anterior frontal lesion.  The forehead area was preprepped with alcohol and prepped and draped in sterile fashion.  A timeout was performed and preoperative antibiotics were administered.  A small incision was made with a 15 blade.  The Vario guide arm was locked in the appropriate trajectory.  High-speed drill was used to perform a twist drill hole.  Navigated brain needle was then passed into the lesion and samples were taken for frozen and permanent section.  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  The needle was withdrawn.  No significant bleeding was noted.  The wound was irrigated.  The skin was closed with 3-0 Vicryl in buried interrupted fashion followed by Steri-Strips.  Patient was then  removed from [place] head holder and turned over the anesthesia service for extubation.  All counts are correct at the end of surgery.  No complications were noted.  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         Condition: stable

## 2021-09-02 NOTE — Anesthesia Postprocedure Evaluation (Signed)
Anesthesia Post Note  Patient: Daisy Oliver  Procedure(s) Performed: FRONTAL STEREOTACTIC BRAIN BIOPSY (Right) APPLICATION OF CRANIAL NAVIGATION (Right)     Patient location during evaluation: PACU Anesthesia Type: General Level of consciousness: awake and alert Pain management: pain level controlled Vital Signs Assessment: post-procedure vital signs reviewed and stable Respiratory status: spontaneous breathing, nonlabored ventilation and respiratory function stable Cardiovascular status: blood pressure returned to baseline Postop Assessment: no apparent nausea or vomiting Anesthetic complications: no   No notable events documented.  Last Vitals:  Vitals:   09/02/21 1605 09/02/21 1645  BP: 133/60 134/77  Pulse: 63 76  Resp: 17 (!) 23  Temp:  (!) 36 C  SpO2: 97% 97%    Last Pain:  Vitals:   09/02/21 1645  TempSrc: Oral  PainSc:                  Marthenia Rolling

## 2021-09-03 ENCOUNTER — Inpatient Hospital Stay (HOSPITAL_COMMUNITY): Payer: Medicare PPO

## 2021-09-03 ENCOUNTER — Encounter (HOSPITAL_COMMUNITY): Payer: Self-pay | Admitting: Neurosurgery

## 2021-09-03 MED ORDER — DEXAMETHASONE 2 MG PO TABS: ORAL_TABLET | ORAL | 0 refills | Status: AC

## 2021-09-03 MED ORDER — LEVETIRACETAM 500 MG PO TABS: 500.0000 mg | ORAL_TABLET | Freq: Two times a day (BID) | ORAL | 0 refills | Status: DC

## 2021-09-03 MED ORDER — SODIUM CHLORIDE 0.9 % IV BOLUS
250.0000 mL | Freq: Once | INTRAVENOUS | Status: AC
  Administered 2021-09-03: 250 mL via INTRAVENOUS

## 2021-09-03 NOTE — Evaluation (Signed)
Physical Therapy Evaluation Patient Details Name: Daisy Oliver MRN: [id number] DOB: 1935/10/26 Today's Date: 09/03/2021  History of Present Illness  Pt is an 86 y/o female s/p R frontal brain tumor biopsy. PMH includes CKD and HTN.  Clinical Impression  Pt admitted secondary to problem above with deficits below. Pt requiring min guard A for mobility tasks this session. Mild unsteadiness and tended to drift to the L during mobility tasks, however, son reports balance had significantly improved. Recommending continuation of HHPT at d/c. Will continue to follow acutely.      Recommendations for follow up therapy are one component of a multi-disciplinary discharge planning process, led by the attending physician.  Recommendations may be updated based on patient status, additional functional criteria and insurance authorization.  Follow Up Recommendations Home health PT    Assistance Recommended at Discharge Frequent or constant Supervision/Assistance  Patient can return home with the following  A little help with bathing/dressing/bathroom;Assistance with cooking/housework;Help with stairs or ramp for entrance;Assist for transportation    Equipment Recommendations None recommended by PT  Recommendations for Other Services       Functional Status Assessment Patient has had a recent decline in their functional status and demonstrates the ability to make significant improvements in function in a reasonable and predictable amount of time.     Precautions / Restrictions Precautions Precautions: Fall Restrictions Weight Bearing Restrictions: No      Mobility  Bed Mobility Overal bed mobility: Needs Assistance Bed Mobility: Supine to Sit, Sit to Supine     Supine to sit: Supervision Sit to supine: Supervision   General bed mobility comments: supervision for safety.    Transfers Overall transfer level: Needs assistance Equipment used: Straight cane Transfers: Sit to/from  Stand Sit to Stand: Min guard           General transfer comment: Min guard for safety.    Ambulation/Gait Ambulation/Gait assistance: Min guard Gait Distance (Feet): 120 Feet Assistive device: Straight cane Gait Pattern/deviations: Step-through pattern, Decreased stride length, Drifts right/left Gait velocity: Decreased     General Gait Details: tended to drift to the L during ambulation, but no overt LOB. Pt's son reports balance is much improved from 2 days ago  Financial trader Rankin (Stroke Patients Only)       Balance Overall balance assessment: Needs assistance Sitting-balance support: No upper extremity supported, Feet supported Sitting balance-Leahy Scale: Good     Standing balance support: No upper extremity supported, Single extremity supported Standing balance-Leahy Scale: Fair                               Pertinent Vitals/Pain Pain Assessment Pain Assessment: No/denies pain    Home Living Family/patient expects to be discharged to:: Private residence Living Arrangements: Spouse/significant other;Children Available Help at Discharge: Family;Available 24 hours/day Type of Home: House Home Access: Stairs to enter Entrance Stairs-Rails: Right Entrance Stairs-Number of Steps: 6   Home Layout: Other (Comment) (2 steps down into bed room) Home Equipment: Cane - single point      Prior Function Prior Level of Function : Independent/Modified Independent             Mobility Comments: uses cane for ambulation ADLs Comments: independent with ADLs     Hand Dominance        Extremity/Trunk Assessment   Upper Extremity Assessment  Upper Extremity Assessment: Defer to OT evaluation    Lower Extremity Assessment Lower Extremity Assessment: Generalized weakness    Cervical / Trunk Assessment Cervical / Trunk Assessment: Normal  Communication   Communication: No difficulties  Cognition  Arousal/Alertness: Lethargic Behavior During Therapy: WFL for tasks assessed/performed Overall Cognitive Status: Impaired/Different from baseline Area of Impairment: Safety/judgement, Problem solving                         Safety/Judgement: Decreased awareness of deficits, Decreased awareness of safety   Problem Solving: Slow processing          General Comments      Exercises     Assessment/Plan    PT Assessment Patient needs continued PT services  PT Problem List Decreased strength;Decreased activity tolerance;Decreased balance;Decreased mobility;Decreased knowledge of use of DME;Decreased knowledge of precautions;Decreased safety awareness;Decreased cognition       PT Treatment Interventions DME instruction;Gait training;Stair training;Therapeutic activities;Therapeutic exercise;Balance training;Patient/family education;Functional mobility training    PT Goals (Current goals can be found in the Care Plan section)  Acute Rehab PT Goals Patient Stated Goal: to go home PT Goal Formulation: With patient Time For Goal Achievement: 09/17/21 Potential to Achieve Goals: Good    Frequency Min 3X/week     Co-evaluation               AM-PAC PT "6 Clicks" Mobility  Outcome Measure Help needed turning from your back to your side while in a flat bed without using bedrails?: None Help needed moving from lying on your back to sitting on the side of a flat bed without using bedrails?: A Little Help needed moving to and from a bed to a chair (including a wheelchair)?: A Little Help needed standing up from a chair using your arms (e.g., wheelchair or bedside chair)?: A Little Help needed to walk in hospital room?: A Little Help needed climbing 3-5 steps with a railing? : A Little 6 Click Score: 19    End of Session Equipment Utilized During Treatment: Gait belt Activity Tolerance: Patient tolerated treatment well Patient left: in bed;with call bell/phone within  reach;with family/visitor present Nurse Communication: Mobility status PT Visit Diagnosis: Unsteadiness on feet (R26.81);Muscle weakness (generalized) (M62.81)    Time: 0160-1093 PT Time Calculation (min) (ACUTE ONLY): 13 min   Charges:   PT Evaluation $PT Eval Low Complexity: 1 Low          Lou Miner, DPT  Acute Rehabilitation Services  Pager: [phone] Office: [phone]   Rudean Hitt 09/03/2021, 12:35 PM

## 2021-09-03 NOTE — Progress Notes (Signed)
Pt seen and examined.  Neuro exam stable from preop- left pronator drift, facial droop.  No complaints of headache.  - await CT head.  If satisfactory, likely discharge at noontime with Keppra, dex taper

## 2021-09-03 NOTE — Progress Notes (Signed)
Pt w/ BP 102/45, MAP 62. Dr. Ellene Route informed. V.O., to "give 250 cc bolus of NS". RBR

## 2021-09-03 NOTE — Discharge Summary (Signed)
Physician Discharge Summary  Patient ID: Daisy Oliver MRN: [id number] DOB/AGE: November 03, 1935 86 y.o.  Admit date: 09/02/2021 Discharge date: 09/03/2021  Admission Diagnoses:  Brain mass  Discharge Diagnoses:  Same Principal Problem:   Status post stereotactic brain biopsy Active Problems:   Brain tumor [facility])   Discharged Condition: Stable  Hospital Course:  Daisy Oliver is a 86 y.o. female who underwent stereotactic biopsy of a multifocal brain mass.  Postop, she was admitted to the neuro ICU where she was monitored.  A CT head performed POD#1 showed expected postoperative changes with no worrisome findings.  She was eating well, with neuro exam at baseline, and was deemed ready for discharge home.   Treatments: Surgery - stereotactic brain biopsy, right frontal  Discharge Exam: Blood pressure (!) 120/49, pulse (!) 56, temperature 97.8 F (36.6 C), temperature source Oral, resp. rate 18, height '5\' 4"'$  (1.626 m), weight 68 kg, SpO2 95 %. Awake, alert, oriented Speech fluent, appropriate CN grossly intact Left facial droop, left hand/arm apraxia, left pronator drift.  Age-appropriate cognitive slowing Steri strips c/d/i  Disposition: Discharge disposition: 01-Home or Self Care        Allergies as of 09/03/2021       Reactions   Barbiturates Other (See Comments)   Reaction not cited   Other Other (See Comments)   Patient has AIP and is not to have hormones or red wine   Pentothal [thiopental] Other (See Comments)   Reaction not cited   Sulfa Antibiotics Other (See Comments)   Reaction not cited   Wheat Bran Nausea And Vomiting        Medication List     TAKE these medications    acetaminophen 325 MG tablet Commonly known as: TYLENOL Take 650 mg by mouth every 6 (six) hours as needed (for pain).   B-12 PO Place 1 tablet under the tongue in the morning.   BETAINE HCL PO Take 1 capsule by mouth in the morning. Ortho Biotic   BIOTIN  PO Take 1 tablet by mouth daily.   CALCIUM 1200+D3 PO Take 1 tablet by mouth daily.   clotrimazole-betamethasone cream Commonly known as: LOTRISONE Apply 1 application topically 2 (two) times daily.   dexamethasone 2 MG tablet Commonly known as: DECADRON Take 2 tablets (4 mg total) by mouth every 8 (eight) hours for 3 days, THEN 1 tablet (2 mg total) every 8 (eight) hours for 3 days, THEN 1 tablet (2 mg total) 2 (two) times daily for 3 days, THEN 0.5 tablets (1 mg total) 2 (two) times daily for 4 days, THEN 0.5 tablets (1 mg total) daily for 4 days. Start taking on: Sep 03, 2021   erythromycin with ethanol 2 % external solution Commonly known as: THERAMYCIN Apply topically daily. What changed:  how much to take when to take this reasons to take this   fexofenadine 180 MG tablet Commonly known as: ALLEGRA Take 1 tablet (180 mg total) by mouth daily.   FISH OIL PO Take 1 capsule by mouth in the morning.   fluticasone 0.05 % cream Commonly known as: CUTIVATE Apply 1 application. topically 2 (two) times daily as needed (for itching).   levETIRAcetam 500 MG tablet Commonly known as: KEPPRA Take 1 tablet (500 mg total) by mouth 2 (two) times daily for 7 days.   liothyronine 5 MCG tablet Commonly known as: CYTOMEL Take 2 tablets (10 mcg total) by mouth 2 (two) times daily. What changed:  when to take this  additional instructions   Magnesium 500 MG Caps Take 500 mg by mouth daily.   meclizine 25 MG tablet Commonly known as: ANTIVERT 1/2-1 pill up to 3 times daily for motion sickness/dizziness What changed:  how much to take how to take this when to take this reasons to take this additional instructions   METANX PO Take by mouth daily.   NEOMYCIN-POLYMYXIN-HYDROCORTISONE 1 % Soln OTIC solution Commonly known as: CORTISPORIN Place 6 drops into both ears 4 (four) times daily. What changed:  how to take this when to take this reasons to take this   NON  FORMULARY Apply 1 application. topically See admin instructions. CBD cream- Apply topically as needed for pain relief   NON FORMULARY Take 1 capsule by mouth See admin instructions. Pure Encapsulations P5P 50  Vitamin B6 Supplement- Take 1 capsule by mouth once a day   olmesartan 20 MG tablet Commonly known as: BENICAR Take 1/2 tablet  Daily  for BP What changed:  how much to take how to take this when to take this additional instructions   OSTEO BI-FLEX JOINT SHIELD PO Take 1 tablet by mouth at bedtime. Osteo Bi-Flex   Taurine 1000 MG Caps Take 1,000 mg by mouth daily.   tretinoin 0.05 % cream Commonly known as: RETIN-A Apply topically at bedtime. What changed:  how much to take when to take this   Vitamin D2 50 MCG (2000 UT) Tabs Take 2,000 Units by mouth daily.   Zinc 50 MG Caps Take 50 mg by mouth daily.        Follow-up Information     Vallarie Mare, MD. Schedule an appointment as soon as possible for a visit in 2 week(s).   Specialty: Neurosurgery Contact information: [address] [place] [address] [phone]         [name], MD. Schedule an appointment as soon as possible for a visit in 1 week(s).   Specialties: Psychiatry, Neurology, Oncology Contact information: [place] [address] [phone]                 Signed: Vallarie Mare 09/03/2021, 9:47 AM

## 2021-09-03 NOTE — Progress Notes (Signed)
  Transition of Care [facility]) Screening Note   Patient Details  Name: Daisy Oliver Date of Birth: Jul 08, 1935   Transition [facility] [place] CM/SW Contact:    Benard Halsted, LCSW Phone Number: 09/03/2021, 9:27 AM    Transition of Care Department [facility]) has reviewed patient. Patient was set up last admission with [facility] for therapies and [facility] for Palliative care. We will continue to monitor patient advancement through interdisciplinary progression rounds. If new patient transition needs arise, please place a TOC consult.

## 2021-09-03 NOTE — Discharge Instructions (Signed)
Ok to shower on wound 5/25.  Steri-strip bandages will fall off on their own.  No submerging wound in bath or swimming pool for 2 weeks.  No strenuous activity, no lifting more than 15 pounds for 2 weeks.

## 2021-09-04 ENCOUNTER — Telehealth: Payer: Self-pay | Admitting: Internal Medicine

## 2021-09-04 DIAGNOSIS — M545 Low back pain, unspecified: Secondary | ICD-10-CM | POA: Diagnosis not present

## 2021-09-04 NOTE — Telephone Encounter (Signed)
Scheduled appt per 5/25 staff msg with RN Manuela Schwartz. Pt's son is aware of appt date and time. Pt's son is aware to arrive 15 mins prior to appt time and to bring and updated insurance card. Pt's son is aware of appt location.

## 2021-09-09 LAB — SURGICAL PATHOLOGY

## 2021-09-12 DIAGNOSIS — M1711 Unilateral primary osteoarthritis, right knee: Secondary | ICD-10-CM | POA: Diagnosis not present

## 2021-09-12 DIAGNOSIS — G9389 Other specified disorders of brain: Secondary | ICD-10-CM | POA: Diagnosis not present

## 2021-09-15 ENCOUNTER — Inpatient Hospital Stay: Payer: Medicare PPO | Attending: Neurosurgery

## 2021-09-15 ENCOUNTER — Other Ambulatory Visit: Payer: Medicare PPO

## 2021-09-15 DIAGNOSIS — C8339 Diffuse large B-cell lymphoma, extranodal and solid organ sites: Secondary | ICD-10-CM | POA: Insufficient documentation

## 2021-09-15 DIAGNOSIS — R2689 Other abnormalities of gait and mobility: Secondary | ICD-10-CM | POA: Insufficient documentation

## 2021-09-15 DIAGNOSIS — N281 Cyst of kidney, acquired: Secondary | ICD-10-CM | POA: Insufficient documentation

## 2021-09-15 DIAGNOSIS — Z818 Family history of other mental and behavioral disorders: Secondary | ICD-10-CM | POA: Insufficient documentation

## 2021-09-15 DIAGNOSIS — G939 Disorder of brain, unspecified: Secondary | ICD-10-CM | POA: Insufficient documentation

## 2021-09-15 DIAGNOSIS — Z87891 Personal history of nicotine dependence: Secondary | ICD-10-CM | POA: Insufficient documentation

## 2021-09-15 DIAGNOSIS — R2681 Unsteadiness on feet: Secondary | ICD-10-CM | POA: Insufficient documentation

## 2021-09-15 DIAGNOSIS — E039 Hypothyroidism, unspecified: Secondary | ICD-10-CM | POA: Insufficient documentation

## 2021-09-15 DIAGNOSIS — Z79899 Other long term (current) drug therapy: Secondary | ICD-10-CM | POA: Insufficient documentation

## 2021-09-15 DIAGNOSIS — I672 Cerebral atherosclerosis: Secondary | ICD-10-CM | POA: Insufficient documentation

## 2021-09-15 DIAGNOSIS — R42 Dizziness and giddiness: Secondary | ICD-10-CM | POA: Insufficient documentation

## 2021-09-15 DIAGNOSIS — Z8261 Family history of arthritis: Secondary | ICD-10-CM | POA: Insufficient documentation

## 2021-09-15 DIAGNOSIS — Z882 Allergy status to sulfonamides status: Secondary | ICD-10-CM | POA: Insufficient documentation

## 2021-09-15 DIAGNOSIS — Z8616 Personal history of COVID-19: Secondary | ICD-10-CM | POA: Insufficient documentation

## 2021-09-15 DIAGNOSIS — Z8262 Family history of osteoporosis: Secondary | ICD-10-CM | POA: Insufficient documentation

## 2021-09-15 DIAGNOSIS — K5730 Diverticulosis of large intestine without perforation or abscess without bleeding: Secondary | ICD-10-CM | POA: Insufficient documentation

## 2021-09-15 DIAGNOSIS — J849 Interstitial pulmonary disease, unspecified: Secondary | ICD-10-CM | POA: Insufficient documentation

## 2021-09-15 DIAGNOSIS — E785 Hyperlipidemia, unspecified: Secondary | ICD-10-CM | POA: Insufficient documentation

## 2021-09-15 DIAGNOSIS — Z8269 Family history of other diseases of the musculoskeletal system and connective tissue: Secondary | ICD-10-CM | POA: Insufficient documentation

## 2021-09-15 DIAGNOSIS — I700 Atherosclerosis of aorta: Secondary | ICD-10-CM | POA: Insufficient documentation

## 2021-09-15 DIAGNOSIS — Z809 Family history of malignant neoplasm, unspecified: Secondary | ICD-10-CM | POA: Insufficient documentation

## 2021-09-15 DIAGNOSIS — I10 Essential (primary) hypertension: Secondary | ICD-10-CM | POA: Insufficient documentation

## 2021-09-15 DIAGNOSIS — Z808 Family history of malignant neoplasm of other organs or systems: Secondary | ICD-10-CM | POA: Insufficient documentation

## 2021-09-15 DIAGNOSIS — M47814 Spondylosis without myelopathy or radiculopathy, thoracic region: Secondary | ICD-10-CM | POA: Insufficient documentation

## 2021-09-15 DIAGNOSIS — I2510 Atherosclerotic heart disease of native coronary artery without angina pectoris: Secondary | ICD-10-CM | POA: Insufficient documentation

## 2021-09-15 DIAGNOSIS — R609 Edema, unspecified: Secondary | ICD-10-CM | POA: Insufficient documentation

## 2021-09-15 DIAGNOSIS — M25569 Pain in unspecified knee: Secondary | ICD-10-CM | POA: Insufficient documentation

## 2021-09-15 DIAGNOSIS — Z8249 Family history of ischemic heart disease and other diseases of the circulatory system: Secondary | ICD-10-CM | POA: Insufficient documentation

## 2021-09-15 DIAGNOSIS — M47816 Spondylosis without myelopathy or radiculopathy, lumbar region: Secondary | ICD-10-CM | POA: Insufficient documentation

## 2021-09-16 ENCOUNTER — Other Ambulatory Visit: Payer: Self-pay

## 2021-09-16 ENCOUNTER — Inpatient Hospital Stay: Payer: Medicare PPO | Admitting: Internal Medicine

## 2021-09-16 VITALS — BP 130/56 | HR 61 | Temp 97.8°F | Resp 18 | Ht 64.0 in | Wt 155.9 lb

## 2021-09-16 DIAGNOSIS — I672 Cerebral atherosclerosis: Secondary | ICD-10-CM

## 2021-09-16 DIAGNOSIS — R2681 Unsteadiness on feet: Secondary | ICD-10-CM

## 2021-09-16 DIAGNOSIS — Z8269 Family history of other diseases of the musculoskeletal system and connective tissue: Secondary | ICD-10-CM

## 2021-09-16 DIAGNOSIS — R609 Edema, unspecified: Secondary | ICD-10-CM

## 2021-09-16 DIAGNOSIS — I10 Essential (primary) hypertension: Secondary | ICD-10-CM

## 2021-09-16 DIAGNOSIS — N281 Cyst of kidney, acquired: Secondary | ICD-10-CM | POA: Diagnosis not present

## 2021-09-16 DIAGNOSIS — I700 Atherosclerosis of aorta: Secondary | ICD-10-CM | POA: Diagnosis not present

## 2021-09-16 DIAGNOSIS — R2689 Other abnormalities of gait and mobility: Secondary | ICD-10-CM | POA: Diagnosis not present

## 2021-09-16 DIAGNOSIS — M25569 Pain in unspecified knee: Secondary | ICD-10-CM | POA: Diagnosis not present

## 2021-09-16 DIAGNOSIS — G939 Disorder of brain, unspecified: Secondary | ICD-10-CM | POA: Diagnosis not present

## 2021-09-16 DIAGNOSIS — E785 Hyperlipidemia, unspecified: Secondary | ICD-10-CM | POA: Diagnosis not present

## 2021-09-16 DIAGNOSIS — M47814 Spondylosis without myelopathy or radiculopathy, thoracic region: Secondary | ICD-10-CM

## 2021-09-16 DIAGNOSIS — Z87891 Personal history of nicotine dependence: Secondary | ICD-10-CM

## 2021-09-16 DIAGNOSIS — Z809 Family history of malignant neoplasm, unspecified: Secondary | ICD-10-CM

## 2021-09-16 DIAGNOSIS — E039 Hypothyroidism, unspecified: Secondary | ICD-10-CM | POA: Diagnosis not present

## 2021-09-16 DIAGNOSIS — R42 Dizziness and giddiness: Secondary | ICD-10-CM

## 2021-09-16 DIAGNOSIS — Z8249 Family history of ischemic heart disease and other diseases of the circulatory system: Secondary | ICD-10-CM | POA: Diagnosis not present

## 2021-09-16 DIAGNOSIS — K5730 Diverticulosis of large intestine without perforation or abscess without bleeding: Secondary | ICD-10-CM

## 2021-09-16 DIAGNOSIS — M47816 Spondylosis without myelopathy or radiculopathy, lumbar region: Secondary | ICD-10-CM | POA: Diagnosis not present

## 2021-09-16 DIAGNOSIS — Z882 Allergy status to sulfonamides status: Secondary | ICD-10-CM

## 2021-09-16 DIAGNOSIS — Z8616 Personal history of COVID-19: Secondary | ICD-10-CM | POA: Diagnosis not present

## 2021-09-16 DIAGNOSIS — Z8262 Family history of osteoporosis: Secondary | ICD-10-CM

## 2021-09-16 DIAGNOSIS — C8589 Other specified types of non-Hodgkin lymphoma, extranodal and solid organ sites: Secondary | ICD-10-CM | POA: Diagnosis not present

## 2021-09-16 DIAGNOSIS — J849 Interstitial pulmonary disease, unspecified: Secondary | ICD-10-CM | POA: Diagnosis not present

## 2021-09-16 DIAGNOSIS — C8339 Diffuse large B-cell lymphoma, extranodal and solid organ sites: Secondary | ICD-10-CM | POA: Diagnosis not present

## 2021-09-16 DIAGNOSIS — Z8261 Family history of arthritis: Secondary | ICD-10-CM

## 2021-09-16 DIAGNOSIS — I2510 Atherosclerotic heart disease of native coronary artery without angina pectoris: Secondary | ICD-10-CM | POA: Diagnosis not present

## 2021-09-16 DIAGNOSIS — Z818 Family history of other mental and behavioral disorders: Secondary | ICD-10-CM

## 2021-09-16 DIAGNOSIS — Z79899 Other long term (current) drug therapy: Secondary | ICD-10-CM | POA: Diagnosis not present

## 2021-09-16 DIAGNOSIS — Z808 Family history of malignant neoplasm of other organs or systems: Secondary | ICD-10-CM

## 2021-09-16 NOTE — Progress Notes (Signed)
[facility] [place], [place] [address] [phone]   New Patient Evaluation  Date of Service: 09/16/21 Patient Name: Daisy Oliver Patient MRN: [id number] Patient DOB: Jan 22, 1936 Provider: [name], MD  Identifying Statement:  Daisy Oliver is a 86 y.o. female with right frontal  CNS lymphoma  who presents for initial consultation and evaluation.    Referring Provider: No referring provider defined for this encounter.  Oncologic History: Oncology History  Primary CNS lymphoma ([place])  09/02/2021 Surgery   Stereotactic biopsy by Dr. Marcello Moores; path is B-cell lymphoma CD20+     Biomarkers:  CD20 positive .  Ki-67 50% .   Unknown   Unknown   History of Present Illness: The patient's records from the referring physician were obtained and reviewed and the patient interviewed to confirm this HPI.  Daisy Oliver presented to medical attention in May 2023 with several weeks of progressive gait instability.  She describes feeling unsteady on her feet, even falling "a couple of times".  After one of the falls, ED visit led to CNS imaging which demonstrated multifocal masses in the right hemisphere.  She underwent stereotactic biopsy with Dr. Marcello Moores on 09/02/21; path demonstrated diffuse large B-cell lymphoma, CD20+.  Since surgery, she has tapered off the steroids without issue.  She feels "fairly normal" aside from pain in her knee and lower back.  She uses a cane to walk, mainly because of the knee pain.  No issues with frank weakness, no seizures.  She lives at home with her husband who has dementia.  Daughter is nearby in [place].  Medications: Current Outpatient Medications on File Prior to Visit  Medication Sig Dispense Refill   acetaminophen (TYLENOL) 325 MG tablet Take 650 mg by mouth every 6 (six) hours as needed (for pain).     BIOTIN PO Take 1 tablet by mouth daily.     Calcium-Magnesium-Vitamin D  (CALCIUM 1200+D3 PO) Take 1 tablet by mouth daily.     clotrimazole-betamethasone (LOTRISONE) cream Apply 1 application topically 2 (two) times daily. (Patient not taking: Reported on 08/27/2021) 15 g 2   Cyanocobalamin (B-12 PO) Place 1 tablet under the tongue in the morning.     dexamethasone (DECADRON) 2 MG tablet Take 2 tablets (4 mg total) by mouth every 8 (eight) hours for 3 days, THEN 1 tablet (2 mg total) every 8 (eight) hours for 3 days, THEN 1 tablet (2 mg total) 2 (two) times daily for 3 days, THEN 0.5 tablets (1 mg total) 2 (two) times daily for 4 days, THEN 0.5 tablets (1 mg total) daily for 4 days. 39 tablet 0   Digestive Enzymes (BETAINE HCL PO) Take 1 capsule by mouth in the morning. Ortho Biotic     Ergocalciferol (VITAMIN D2) 2000 UNITS TABS Take 2,000 Units by mouth daily.     erythromycin with ethanol (THERAMYCIN) 2 % external solution Apply topically daily. (Patient taking differently: Apply 1 application. topically daily as needed (to insect bites).) 60 mL 1   fexofenadine (ALLEGRA) 180 MG tablet Take 1 tablet (180 mg total) by mouth daily. (Patient not taking: Reported on 08/27/2021)     fluticasone (CUTIVATE) 0.05 % cream Apply 1 application. topically 2 (two) times daily as needed (for itching).     L-Methylfolate-Algae-B12-B6 (METANX PO) Take by mouth daily.     levETIRAcetam (KEPPRA) 500 MG tablet Take 1 tablet (500 mg total) by mouth 2 (two) times daily for  7 days. 14 tablet 0   liothyronine (CYTOMEL) 5 MCG tablet Take 2 tablets (10 mcg total) by mouth 2 (two) times daily. (Patient taking differently: Take 10 mcg by mouth See admin instructions. Take 10 mcg by mouth before breakfast and lunch) 360 tablet 3   Magnesium 500 MG CAPS Take 500 mg by mouth daily.     meclizine (ANTIVERT) 25 MG tablet 1/2-1 pill up to 3 times daily for motion sickness/dizziness (Patient taking differently: Take 25 mg by mouth 3 (three) times daily as needed for dizziness.) 30 tablet 0   Misc Natural  Products (OSTEO BI-FLEX JOINT SHIELD PO) Take 1 tablet by mouth at bedtime. Osteo Bi-Flex     NEOMYCIN-POLYMYXIN-HYDROCORTISONE (CORTISPORIN) 1 % SOLN OTIC solution Place 6 drops into both ears 4 (four) times daily. (Patient taking differently: Place 6 drops into the right ear daily as needed ("for pain").) 10 mL 3   NON FORMULARY Apply 1 application. topically See admin instructions. CBD cream- Apply topically as needed for pain relief     NON FORMULARY Take 1 capsule by mouth See admin instructions. Pure Encapsulations P5P 50  Vitamin B6 Supplement- Take 1 capsule by mouth once a day     olmesartan (BENICAR) 20 MG tablet Take 1/2 tablet  Daily  for BP (Patient taking differently: Take 10 mg by mouth daily.) 45 tablet 3   Omega-3 Fatty Acids (FISH OIL PO) Take 1 capsule by mouth in the morning.     Taurine 1000 MG CAPS Take 1,000 mg by mouth daily.     tretinoin (RETIN-A) 0.05 % cream Apply topically at bedtime. (Patient taking differently: Apply 1 application. topically every Monday, Wednesday, and Friday at 8 PM.) 45 g 0   Zinc 50 MG CAPS Take 50 mg by mouth daily.     No current facility-administered medications on file prior to visit.    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 Herbert Deaner   FRAMELESS  BIOPSY WITH BRAINLAB Right 09/02/2021   Procedure: FRONTAL STEREOTACTIC BRAIN BIOPSY;  Surgeon: Vallarie Mare, MD;  Location: [facility];  Service: Neurosurgery;  Laterality: Right;   RADIAL HEAD ARTHROPLASTY Right 09/16/2012   Procedure: RADIAL HEAD ARTHROPLASTY;  Surgeon: Johnny Bridge, MD;  Location: [facility];  Service: Orthopedics;  Laterality: Right;  RADIAL HEAD REPLACEMENT     Social History:  Social History   Socioeconomic History   Marital  status: Married    Spouse name: Not on file   Number of children: 2   Years of education: Not on file   Highest education level: Not on file  Occupational History   Not on file  Tobacco Use   Smoking status: Former    Packs/day: 2.00    Years: 22.00    Pack years: 44.00    Types: Cigarettes    Quit date: 09/17/1983    Years since quitting: 38.0   Smokeless tobacco: Never  Vaping Use   Vaping Use: Never used  Substance and Sexual Activity   Alcohol use: Not Currently    Alcohol/week: 0.0 - 1.0 standard drinks    Comment: 2/month   Drug use: No   Sexual activity: Not Currently  Other Topics Concern   Not on file  Social History Narrative   Not on file   Social Determinants of Health   Financial Resource Strain: Not on file  Food Insecurity: Not on file  Transportation Needs: Not on file  Physical Activity: Not on file  Stress: Not on file  Social Connections: Not on file  Intimate Partner Violence: Not on file   Family History:  Family History  Problem Relation Age of Onset   Heart disease Mother    Brain cancer Father 67       Brain tumor, not cancerous   Hypertension Sister    Osteoporosis Sister    Arthritis Sister     Scoliosis Sister    Hypertension Sister    Osteoporosis Sister    Cancer Paternal Grandfather    Suicidality Paternal Grandfather    Breast cancer Neg Hx     Review of Systems: Constitutional: Doesn't report fevers, chills or abnormal weight loss Eyes: Doesn't report blurriness of vision Ears, nose, mouth, throat, and face: Doesn't report sore throat Respiratory: Doesn't report cough, dyspnea or wheezes Cardiovascular: Doesn't report palpitation, chest discomfort  Gastrointestinal:  Doesn't report nausea, constipation, diarrhea GU: Doesn't report incontinence Skin: Doesn't report skin rashes Neurological: Per HPI Musculoskeletal: Doesn't report joint pain Behavioral/Psych: Doesn't report anxiety  Physical Exam: Vitals:   09/16/21 1410  BP: (!) 130/56  Pulse: 61  Resp: 18  Temp: 97.8 F (36.6 C)  SpO2: 96%   KPS: 80. General: Alert, cooperative, pleasant, in no acute distress Head: Normal EENT: No conjunctival injection or scleral icterus.  Lungs: Resp effort normal Cardiac: Regular rate Abdomen: Non-distended abdomen Skin: No rashes cyanosis or petechiae. Extremities: No clubbing or edema  Neurologic Exam: Mental Status: Awake, alert, attentive to examiner. Oriented to self and environment. Language is fluent with intact comprehension.  Cranial Nerves: Visual acuity is grossly normal. Visual fields are full. Extra-ocular movements intact. No ptosis. Face is symmetric Motor: Tone and bulk are normal. Power is full in both arms and legs. Reflexes are symmetric, no pathologic reflexes present.  Sensory: Intact to light touch Gait: Orthopedic limitation, cane assisted   Labs: I have reviewed the data as listed    Component Value Date/Time   NA 135 08/28/2021 0417   K 3.5 08/28/2021 0417   CL 104 08/28/2021 0417   CO2 26 08/28/2021 0417   GLUCOSE 133 (H) 08/28/2021 0417   BUN 21 08/28/2021 0417   CREATININE 0.82 08/28/2021 0417   CREATININE 0.72 04/01/2021 1443    CALCIUM 9.2 08/28/2021 0417   PROT 6.5 08/28/2021 0417   ALBUMIN 3.3 (L) 08/28/2021 0417   AST 36 08/28/2021 0417   ALT 30 08/28/2021 0417  ALKPHOS 42 08/28/2021 0417   BILITOT 0.7 08/28/2021 0417   GFRNONAA >60 08/28/2021 0417   GFRNONAA 66 09/19/2020 0000   GFRAA 76 09/19/2020 0000   Lab Results  Component Value Date   WBC 8.6 08/28/2021   NEUTROABS 5.8 08/27/2021   HGB 13.0 08/28/2021   HCT 37.1 08/28/2021   MCV 90.9 08/28/2021   PLT 189 08/28/2021    Imaging: CT HEAD WO CONTRAST  Result Date: 09/03/2021 CLINICAL DATA:  Brain/CNS neoplasm, assess treatment response EXAM: CT HEAD WITHOUT CONTRAST TECHNIQUE: Contiguous axial images were obtained from the base of the skull through the vertex without intravenous contrast. RADIATION DOSE REDUCTION: This exam was performed according to the departmental dose-optimization program which includes automated exposure control, adjustment of the mA and/or kV according to patient size and/or use of iterative reconstruction technique. COMPARISON:  CT head May 17, 23. MRI head Aug 28, 2021 and Aug 27, 2021. FINDINGS: Brain: Postsurgical changes of right frontal burr hole and biopsy with small volume of intraparenchymal and extra-axial hemorrhage and gas in this region. Redemonstrated multiple masses, largest in the right basal ganglia and better characterized on prior MRI. Similar extensive surrounding vasogenic edema. Mass effect is similar versus slightly increased with 9 mm of leftward midline shift. No evidence of acute large vascular territory infarct or hydrocephalus. Vascular: No hyperdense vessel identified. Skull: Right frontal burr hole. Little bit of overlying gas/edema in the scalp. No acute fracture. Sinuses/Orbits: Clear sinuses. Other: No mastoid effusions. IMPRESSION: 1. Postsurgical changes of right frontal biopsy with small volume of intraparenchymal and extra-axial hemorrhage and gas in this region. 2. Redemonstrated multiple masses,  largest in the right basal ganglia and better characterized on recent MRI. Similar extensive surrounding vasogenic edema. Mass effect is similar versus slightly increased with 9 mm of leftward midline shift. Electronically Signed   By: Margaretha Sheffield M.D.   On: 09/03/2021 09:38   CT HEAD WO CONTRAST (5MM)  Addendum Date: 08/27/2021   ADDENDUM REPORT: 08/27/2021 11:51 ADDENDUM: Findings discussed with Liane Comber, NP via telephone at 11:42 a.m. Additionally, I spoke with the technologist Marzetta Board and the patient will be transported to the emergency department for management. Electronically Signed   By: Margaretha Sheffield M.D.   On: 08/27/2021 11:51   Result Date: 08/27/2021 CLINICAL DATA:  Dizziness, non-specific Dizziness, non-specific; progressive unsteady gait, atypical frequent falls, difficulty focusing, now with new headaches, progresive 8 weeks, r/o NPH EXAM: CT HEAD WITHOUT CONTRAST TECHNIQUE: Contiguous axial images were obtained from the base of the skull through the vertex without intravenous contrast. RADIATION DOSE REDUCTION: This exam was performed according to the departmental dose-optimization program which includes automated exposure control, adjustment of the mA and/or kV according to patient size and/or use of iterative reconstruction technique. COMPARISON:  None Available. FINDINGS: Brain: Extensive vasogenic edema centered in the right frontal lobe but also involving the anterior parietal lobe and temporal lobes. Probable mass lesion in the right basal ganglia region and overlying frontal lobe. Resulting mass effect with effacement the right lateral ventricle and approximately 8 mm of leftward midline shift. No evidence of hydrocephalus. No clear evidence of acute hemorrhage. Basal cisterns are patent. Vascular: No hyperdense vessel identified. Calcific intracranial atherosclerosis Skull: No acute fracture. Sinuses/Orbits: Clear sinuses.  No acute orbital findings. Other: No mastoid  effusions. IMPRESSION: Extensive vasogenic edema in the right frontal, parietal and temporal lobes with probable mass in the right basal ganglia and overlying frontal lobe, concerning for malignancy but not well evaluated. Resulting  mass effect with approximately 8 mm of leftward midline shift and effacement the right lateral ventricle. Recommend MRI with contrast to further evaluate. Also, recommend urgent neurosurgical consultation. Electronically Signed: By: Margaretha Sheffield M.D. On: 08/27/2021 11:35   MR BRAIN W WO CONTRAST  Result Date: 08/28/2021 CLINICAL DATA:  Intracranial neoplasm planning for biopsy EXAM: MRI HEAD WITHOUT AND WITH CONTRAST TECHNIQUE: Multiplanar, multiecho pulse sequences of the brain and surrounding structures were obtained without and with intravenous contrast. CONTRAST:  25m GADAVIST GADOBUTROL 1 MMOL/ML IV SOLN COMPARISON:  08/27/2021 FINDINGS: Brain: Dominant contrast enhancing mass centered in the right basal ganglia is unchanged. A satellite lesion at the anterior right frontal lobe is also unchanged. Unchanged large amount of surrounding edema causing 8 mm of leftward midline shift. Vascular: Major flow voids are preserved. Skull and upper cervical spine: Normal calvarium and skull base. Visualized upper cervical spine and soft tissues are normal. Sinuses/Orbits:No paranasal sinus fluid levels or advanced mucosal thickening. No mastoid or middle ear effusion. Normal orbits. IMPRESSION: 1. Unchanged size and appearance of dominant mass centered in the right basal ganglia with large amount of surrounding edema and 8 mm of leftward midline shift. 2. Satellite lesion at the anterior right frontal lobe is also unchanged. Lymphoma is most likely. Electronically Signed   By: KUlyses JarredM.D.   On: 08/28/2021 22:03   MR BRAIN W WO CONTRAST  Result Date: 08/27/2021 CLINICAL DATA:  Brain mass.  Abnormal CT today. EXAM: MRI HEAD WITHOUT AND WITH CONTRAST TECHNIQUE: Multiplanar,  multiecho pulse sequences of the brain and surrounding structures were obtained without and with intravenous contrast. CONTRAST:  715mGADAVIST GADOBUTROL 1 MMOL/ML IV SOLN COMPARISON:  CT head 08/27/2021.  MRI head 02/01/2007 FINDINGS: Brain: Large infiltrating mass in the right frontal and temporal lobe. There are areas of edema within the mass and areas of enhancement within the mass. The areas of enhancement show restricted diffusion. There is mass-effect and 8 mm midline shift to the left. There is compression of the right lateral ventricle. No hydrocephalus. Multiple areas of enhancement including the right anterior frontal lobe, right insula, right basal ganglia. Masslike is relatively solid without areas of necrosis. The mass appears to be centered in the right basal ganglia. No associated hemorrhage. No abnormal enhancement or edema in the left hemisphere. Brainstem and cerebellum normal. Vascular: Normal arterial flow voids. Skull and upper cervical spine: No focal lesion. Sinuses/Orbits: Paranasal sinuses clear. Bilateral cataract extraction Other: None IMPRESSION: Large infiltrating mass in the right frontal and temporal lobe extending back towards the parietal lobe. There are areas of nonenhancing edema as well as nodular areas of enhancement. The epicenter appears to be in the right basal ganglia. 8 mm midline shift to the left. Findings most consistent with primary CNS neoplasm. CNS lymphoma is a possibility given the homogeneous enhancement and lack of necrosis. Glioblastoma also possible. Tissue sampling will be necessary for diagnosis. Electronically Signed   By: ChFranchot Gallo.D.   On: 08/27/2021 17:55   CT CHEST ABDOMEN PELVIS W CONTRAST  Result Date: 08/28/2021 CLINICAL DATA:  CNS neoplasm, evaluate for metastatic disease EXAM: CT CHEST, ABDOMEN, AND PELVIS WITH CONTRAST TECHNIQUE: Multidetector CT imaging of the chest, abdomen and pelvis was performed following the standard protocol during  bolus administration of intravenous contrast. RADIATION DOSE REDUCTION: This exam was performed according to the departmental dose-optimization program which includes automated exposure control, adjustment of the mA and/or kV according to patient size and/or use of iterative reconstruction  technique. CONTRAST:  175m OMNIPAQUE IOHEXOL 300 MG/ML  SOLN COMPARISON:  None Available. FINDINGS: CT CHEST FINDINGS Cardiovascular: The heart is normal in size. No pericardial effusion. No evidence of thoracic aortic aneurysm. Mild atherosclerotic calcifications of the arch. Mild three-vessel coronary sclerosis. Mediastinum/Nodes: No suspicious thoracic lymphadenopathy. Visualized thyroid is unremarkable. Lungs/Pleura: Mild subpleural reticulation/fibrosis in the lungs bilaterally, lower lobe predominant, favoring mild chronic interstitial lung disease. Superimposed mild dependent atelectasis in the bilateral lower lobes. No suspicious pulmonary nodules. No focal consolidation. No pleural effusion or pneumothorax. Musculoskeletal: Degenerative changes of the thoracic spine. CT ABDOMEN PELVIS FINDINGS Hepatobiliary: Liver is within normal limits. Gallbladder is unremarkable. No intrahepatic or extrahepatic ductal dilatation. Pancreas: Within normal limits. Spleen: Calcified splenic granulomata. Adrenals/Urinary Tract: Adrenal glands are within normal limits. 5 mm right upper pole renal cyst (series 3/image 61). Left kidney is within normal limits. No hydronephrosis. Bladder is within normal limits. Stomach/Bowel: Stomach is within normal limits. No evidence of bowel obstruction. Appendix is not discretely visualized. Sigmoid diverticulosis, without evidence of diverticulitis. Vascular/Lymphatic: No evidence of abdominal aortic aneurysm. Atherosclerotic calcifications of the abdominal aorta and branch vessels. No suspicious abdominopelvic lymphadenopathy. Reproductive: Uterus is within normal limits. No adnexal masses. Other: No  abdominopelvic ascites. Musculoskeletal: Degenerative changes of the lumbar spine. IMPRESSION: No findings suspicious for metastatic disease in the chest, abdomen, or pelvis. Additional ancillary findings as above. Electronically Signed   By: SJulian HyM.D.   On: 08/28/2021 01:10     Pathology: SURGICAL PATHOLOGY  CASE: [id number] [facility]   Clinical History: brain tumor (cm)   FINAL MICROSCOPIC DIAGNOSIS:   A. BRAIN TUMOR, RIGHT FRONTAL LOBE, BIOPSY:  -  Diffuse large B-cell lymphoma.  B. BRAIN TUMOR, RIGHT FRONTAL LOBE, BIOPSY:  -  Diffuse large B-cell lymphoma.   Note: The cells of interest consist of large cells with prominent small  nuclear membrane bound nucleoli and apparent scant cytoplasm.  Immunohistochemical stains with appropriate controls were performed on  both parts A and B given the morphologic differences (likely frozen  section artifact).  The cells of interest are positive for CD20 and PAX5  consistent with B-cell origin.  GFAP highlights background brain  parenchyma.  These same cells coexpress MUM1 (greater than 50%) BCL6 and  CD5.  CD10 is essentially negative.  Bcl-2 is equivocal given high  background but interpreted as likely positive.  CD3 highlights  background small T cells.  The proliferation rate by Ki-67 is  approximately 50%.  CD30 is negative.  EBER ISH is negative.  P53 shows  scattered positive cells of uncertain clinical significance.  Overall  the findings are consistent with a diffuse large B-cell lymphoma; if a  systemic work-up is negative for lymphoma, the findings would be  consistent with a primary CNS lymphoma.  The immunophenotype is most  consistent with a post germinal center/activated immunophenotype.  There  is sufficient material present for further FISH/molecular work-up if  clinically indicated (please call office for add-on testing if  necessary).   Dr. SGari Crownhas peer  reviewed the case and agrees with the interpretation.   INTRAOPERATIVE DIAGNOSIS:   A. Brain Tumor, Right Frontal Lobe, Biopsy: "High grade astrocytoma."  Intraoperative diagnosis rendered by Dr. NThornell Sartoriusat 14:55 on 02 Sep 2021.    Assessment/Plan Primary CNS lymphoma ([place] - Plan: NM PET Image Initial (PI) Whole Body, MR TOTAL SPINE METS SCREENING, IR IMAGING GUIDED PORT INSERTION  We appreciate the opportunity to participate  in the care of Daisy Oliver.  She presents with clinical and radiographic syndrome consistent with primary CNS lymphoma, with disease mainly within right frontal lobe.  She maintains a good independent functional status despite being off steroids at this time.   We had an extensive conversation with her family regarding pathology, prognosis, and available treatment pathways.     Further needed workup, including FDG-PET, MRI spine, slit lamp exam, were reviewed.  We did discuss interventions including high dose methotrexate, rituximab, whole brain radiotherapy, or some combination.  We also discussed hospice referral if focus on quality of life is preferred.  After discussion, she would like to proceed with chemotherapy.  While staging is in process, we will plan for induction therapy with high dose methotrexate with rituximab, as discussed with the patient.  The goal of therapy will be complete response.  We discussed the method of delivery which will be inpatient administration for 3.5g/m2 MTX every 2-3 weeks for 6 cycles, with leucovorin rescue q6 hours after 24 hours.  Rituximab will be administered on day 3 of each cycle. We will target initiation of therapy by the end of the month.    Screening for potential clinical trials was performed and discussed using eligibility criteria for active protocols at [facility], loco-regional tertiary centers, as well as national database available on [email or web address].    The patient is not a candidate for a research  protocol at this time due to no suitable study identified.   She will remain off decadron while asymptomatic.    We spent twenty additional minutes teaching regarding the natural history, biology, and historical experience in the treatment of brain tumors. We then discussed in detail the current recommendations for therapy focusing on the mode of administration, mechanism of action, anticipated toxicities, and quality of life issues associated with this plan. We also provided teaching sheets for the patient to take home as an additional resource.  All questions were answered. The patient knows to call the clinic with any problems, questions or concerns. No barriers to learning were detected.  The total time spent in the encounter was 60 minutes and more than 50% was on counseling and review of test results   [name], MD Medical Director of [facility] at [place] 09/16/21 2:05 PM

## 2021-09-17 ENCOUNTER — Telehealth: Payer: Self-pay

## 2021-09-17 NOTE — Telephone Encounter (Signed)
Daisy Oliver stated that Daisy Oliver daughter called to set up an appointment but she did not have the office notes from Dr. Renda Rolls visit on 09-16-21. She requested that they be faxed to her at [phone]. Faxed office note which does mention the slit lamp exam.

## 2021-09-17 NOTE — Telephone Encounter (Signed)
Called pt's cell phone # [phone] and her daughter answered for her, this LPN gave her the # to Centralized scheduling for the patient to schedule her PET scan and Total Spine MRI within the week. She verbalized understanding. No further questions or concerns.

## 2021-09-17 NOTE — Telephone Encounter (Signed)
Called Tiffany in IR.  LM to have her call  Dr. Renda Rolls nurse tomorrow to let her know the status of the Port insertion.  Stated that it needed to be placed prior to 09-29-21 admission.

## 2021-09-17 NOTE — Telephone Encounter (Signed)
-----   Message from Doreen Salvage, LPN sent at 09/19/6293 12:50 PM EDT ----- Regarding: FW: Please schedule, urgent!  ----- Message ----- From: Donita Brooks D Sent: 09/16/2021   4:33 PM EDT To: Doreen Salvage, LPN Subject: RE: Please schedule, urgent!                   I've already LM for her to call back to schedule  ----- Message ----- From: Doreen Salvage, LPN Sent: 05/21/4130   4:23 PM EDT To: Riley Lam Subject: RE: Please schedule, urgent!                   Yes that is fine, thank you! ----- Message ----- From: Donita Brooks D Sent: 09/16/2021   4:07 PM EDT To: Doreen Salvage, LPN Subject: RE: Please schedule, urgent!                   I can schedule her on 6/14 if that's ok with Dr Mickeal Skinner??  That's the first available to do both same day  Tonya  ----- Message ----- From: Doreen Salvage, LPN Sent: 07/15/100   3:57 PM EDT To: Centralized Scheduling Subject: Please schedule, urgent!                       Please schedule PET scan and Total spine MRI within the week please. Thank you!

## 2021-09-17 NOTE — Telephone Encounter (Signed)
Reviewed Appointments and the PET is set up for 09-22-21 at 1100 and the MRI is set up for 09-25-21 at 0800.

## 2021-09-18 ENCOUNTER — Telehealth: Payer: Self-pay | Admitting: *Deleted

## 2021-09-18 ENCOUNTER — Other Ambulatory Visit: Payer: Self-pay | Admitting: Internal Medicine

## 2021-09-18 DIAGNOSIS — N182 Chronic kidney disease, stage 2 (mild): Secondary | ICD-10-CM | POA: Diagnosis not present

## 2021-09-18 DIAGNOSIS — Z515 Encounter for palliative care: Secondary | ICD-10-CM | POA: Diagnosis not present

## 2021-09-18 DIAGNOSIS — I129 Hypertensive chronic kidney disease with stage 1 through stage 4 chronic kidney disease, or unspecified chronic kidney disease: Secondary | ICD-10-CM | POA: Diagnosis not present

## 2021-09-18 DIAGNOSIS — H02055 Trichiasis without entropian left lower eyelid: Secondary | ICD-10-CM | POA: Diagnosis not present

## 2021-09-18 DIAGNOSIS — Z79899 Other long term (current) drug therapy: Secondary | ICD-10-CM | POA: Diagnosis not present

## 2021-09-18 DIAGNOSIS — C719 Malignant neoplasm of brain, unspecified: Secondary | ICD-10-CM | POA: Diagnosis not present

## 2021-09-18 DIAGNOSIS — H348322 Tributary (branch) retinal vein occlusion, left eye, stable: Secondary | ICD-10-CM | POA: Diagnosis not present

## 2021-09-18 LAB — HM DIABETES EYE EXAM

## 2021-09-18 NOTE — Telephone Encounter (Signed)
Spoke with patients daughter and provided timeline for CNS Lymphoma work up.  Also provided details for planned admission.  She advised that patient will be out of town between 11/08/2021 thru 11/16/2021 which will impact the treatment schedule.  They saw Dr Herbert Deaner and was referred to see Dr Zigmund Daniel on 09/23/2021 due to the retinal bleeding for final clearance.

## 2021-09-18 NOTE — Progress Notes (Signed)
START ON PATHWAY REGIMEN - Neuro     Cycle 1: A cycle is 28 days:     Methotrexate      Leucovorin      Rituximab-xxxx      Temozolomide    Cycle 2: A cycle is 28 days:     Methotrexate      Leucovorin      Rituximab-xxxx      Temozolomide    Cycles 3 and 4: A cycle is every 28 days:     Methotrexate      Leucovorin      Temozolomide   **Always confirm dose/schedule in your pharmacy ordering system**  Patient Characteristics: Primary Large B-Cell Lymphoma of the CNS, Newly Diagnosed, Induction, Candidate for High-Dose Methotrexate Disease Classification: Primary Large B-Cell Lymphoma of the CNS Disease Status: Newly Diagnosed Intent of Therapy: Non-Curative / Palliative Intent, Discussed with Patient

## 2021-09-21 ENCOUNTER — Encounter: Payer: Self-pay | Admitting: Adult Health

## 2021-09-22 ENCOUNTER — Encounter: Payer: Medicare PPO | Admitting: Adult Health

## 2021-09-22 ENCOUNTER — Ambulatory Visit (HOSPITAL_COMMUNITY)
Admission: RE | Admit: 2021-09-22 | Discharge: 2021-09-22 | Disposition: A | Discharge status: Home / Self Care | Payer: Medicare PPO | Source: Ambulatory Visit | Attending: Internal Medicine | Admitting: Internal Medicine

## 2021-09-22 DIAGNOSIS — C8589 Other specified types of non-Hodgkin lymphoma, extranodal and solid organ sites: Secondary | ICD-10-CM | POA: Insufficient documentation

## 2021-09-22 LAB — GLUCOSE, CAPILLARY: Glucose-Capillary: 92 mg/dL (ref 70–99)

## 2021-09-22 MED ORDER — FLUDEOXYGLUCOSE F - 18 (FDG) INJECTION
8.0000 | Freq: Once | INTRAVENOUS | Status: AC | PRN
  Administered 2021-09-22: 7.76 via INTRAVENOUS

## 2021-09-23 ENCOUNTER — Encounter (INDEPENDENT_AMBULATORY_CARE_PROVIDER_SITE_OTHER): Payer: Medicare PPO | Admitting: Ophthalmology

## 2021-09-23 ENCOUNTER — Other Ambulatory Visit: Payer: Self-pay | Admitting: Radiology

## 2021-09-23 ENCOUNTER — Other Ambulatory Visit: Payer: Self-pay | Admitting: Radiation Therapy

## 2021-09-23 ENCOUNTER — Encounter: Payer: Self-pay | Admitting: Adult Health

## 2021-09-23 ENCOUNTER — Ambulatory Visit: Payer: Medicare PPO | Admitting: Adult Health

## 2021-09-23 VITALS — BP 122/72 | HR 70 | Temp 97.1°F | Ht 64.0 in | Wt 150.6 lb

## 2021-09-23 DIAGNOSIS — E785 Hyperlipidemia, unspecified: Secondary | ICD-10-CM

## 2021-09-23 DIAGNOSIS — N182 Chronic kidney disease, stage 2 (mild): Secondary | ICD-10-CM

## 2021-09-23 DIAGNOSIS — H348322 Tributary (branch) retinal vein occlusion, left eye, stable: Secondary | ICD-10-CM

## 2021-09-23 DIAGNOSIS — R32 Unspecified urinary incontinence: Secondary | ICD-10-CM

## 2021-09-23 DIAGNOSIS — E559 Vitamin D deficiency, unspecified: Secondary | ICD-10-CM | POA: Diagnosis not present

## 2021-09-23 DIAGNOSIS — Z0001 Encounter for general adult medical examination with abnormal findings: Secondary | ICD-10-CM | POA: Diagnosis not present

## 2021-09-23 DIAGNOSIS — I10 Essential (primary) hypertension: Secondary | ICD-10-CM | POA: Diagnosis not present

## 2021-09-23 DIAGNOSIS — E538 Deficiency of other specified B group vitamins: Secondary | ICD-10-CM | POA: Diagnosis not present

## 2021-09-23 DIAGNOSIS — Z1389 Encounter for screening for other disorder: Secondary | ICD-10-CM

## 2021-09-23 DIAGNOSIS — C8339 Primary central nervous system lymphoma: Secondary | ICD-10-CM

## 2021-09-23 DIAGNOSIS — Z131 Encounter for screening for diabetes mellitus: Secondary | ICD-10-CM | POA: Diagnosis not present

## 2021-09-23 DIAGNOSIS — E039 Hypothyroidism, unspecified: Secondary | ICD-10-CM | POA: Diagnosis not present

## 2021-09-23 DIAGNOSIS — Z79899 Other long term (current) drug therapy: Secondary | ICD-10-CM | POA: Diagnosis not present

## 2021-09-23 DIAGNOSIS — Z0000 Encounter for general adult medical examination without abnormal findings: Secondary | ICD-10-CM

## 2021-09-23 DIAGNOSIS — R159 Full incontinence of feces: Secondary | ICD-10-CM

## 2021-09-23 DIAGNOSIS — H35033 Hypertensive retinopathy, bilateral: Secondary | ICD-10-CM | POA: Diagnosis not present

## 2021-09-23 DIAGNOSIS — Z6826 Body mass index (BMI) 26.0-26.9, adult: Secondary | ICD-10-CM

## 2021-09-23 DIAGNOSIS — H43813 Vitreous degeneration, bilateral: Secondary | ICD-10-CM | POA: Diagnosis not present

## 2021-09-23 DIAGNOSIS — M545 Low back pain, unspecified: Secondary | ICD-10-CM

## 2021-09-23 DIAGNOSIS — Z85828 Personal history of other malignant neoplasm of skin: Secondary | ICD-10-CM

## 2021-09-23 DIAGNOSIS — Z136 Encounter for screening for cardiovascular disorders: Secondary | ICD-10-CM

## 2021-09-23 DIAGNOSIS — M810 Age-related osteoporosis without current pathological fracture: Secondary | ICD-10-CM

## 2021-09-23 DIAGNOSIS — C8589 Other specified types of non-Hodgkin lymphoma, extranodal and solid organ sites: Secondary | ICD-10-CM

## 2021-09-23 NOTE — Progress Notes (Signed)
Encounter for Annual Physical Exam   Assessment:   Encounter for Annual Physical Exam with abnormal findings Due annually  Health Maintenance reviewed Healthy lifestyle reviewed and goals set  - schedule mammogram at breast center - Defer shingrix for now  Other migraine without status migrainosus, not intractable None recent;   Hypothyroidism, unspecified type -check TSH level, continue medications the same, reminded to take on an empty stomach 30-41mns before food.   Rosacea Continue follow up derm as needed  Hypertension - continue medications, DASH diet, exercise and monitor at home. Call if greater than 130/80.   Hyperlipidemia, unspecified hyperlipidemia type - Controlled by lifestyle - check lipids, decrease fatty foods, increase activity.   Vitamin D deficiency -     Continue supplement - recheck   CKD II Increase fluids, avoid NSAIDS, monitor sugars, will monitor  History of BCC Derm follows annually   Osteoporosis, unspecified osteoporosis type, unspecified pathological fracture presence Declines foxamax, continue vitamin D, high calcium diet encouraged Repeat DEXA in 2 years, plan endocrinology referral (which she is agreeable) if progressed at that point  B12 def On supplement - B12  Primary CNS lymphoma ([place] Newly dx 08/2021; planning to start chemotherapy Dr. VMickeal Skinneris following  Lumbar back pain Pending MRI tomorrow by ortho Dr. WPerley JainContinue tylenol PRN, heat Pending PET results   New incontinence of bladder/bowel  X 1-2 weeks, wearing depends, pending lumbar MRI by ortho  Alternately ? R/t R frontal brain tumor  Hold off on any treatment decisions pending imaging results I recommended for her/family to discuss these new sx with her neuro/oncologist - Dr. VMickeal Oliver   Orders Placed This Encounter  Procedures   CBC with Differential/Platelet   COMPLETE METABOLIC PANEL WITH GFR   Magnesium   Lipid panel   TSH   Hemoglobin A1c    VITAMIN D 25 Hydroxy (Vit-D Deficiency, Fractures)   Microalbumin / creatinine urine ratio   Urinalysis, Routine w reflex microscopic   Vitamin B12     Future Appointments  Date Time [place] 09/24/2021 12:30 PM [facility] None  09/24/2021  2:30 PM WL-IR 1 [facility]-IR [place]  09/25/2021  8:00 AM WL-MR 1 WL-MRI   09/29/2021  7:00 AM [facility] BO[facility] CONFER[facility] None  04/01/2022  2:00 PM CDarrol Jump NP [facility] None  09/24/2022  3:00 PM WAlycia Rossetti NP [facility] None      Subjective:   Daisy Oliver a 86y.o. female who presents for CPE and follow up. She has Hypertension; Hyperlipidemia; Migraines; Rosacea; Vitamin D deficiency; Medication management; Hypothyroidism; History of basal cell cancer; CKD (chronic kidney disease) stage 2, GFR 60-89 ml/min; Age-related osteoporosis without current pathological fracture; Neoplasm of brain causing mass effect and brain compression on adjacent structures (Daisy Oliver; Primary CNS lymphoma (Daisy Oliver; Status post stereotactic brain biopsy; B12 deficiency; Bowel and bladder incontinence; and Lumbar back pain on their problem list.  She is married, 2 children, no grandchildren. She is a retired tPharmacist, hospital various ages.  Husband Daisy Beardhas memory issues.   She is accompanied by her daughter Daisy Oliver Commenttoday.   Patient was recently diagnosed with CNS lymphoma. She presented with unsteady gait, dizziness; also progressive left hand coordination problems; she underwent CT head 08/27/2021 that showed large R frontal mass with mass effect, was referred to ER where MRI showed multifocal masses of R hemisphere. She underwent stereotactic biopsy with Daisy Oliver 09/02/21; path demonstrated diffuse large B-cell lymphoma, CD20+.  After surgery,  she was tapered off the steroids/keppra without issue. She is following with Daisy Oliver, planning induction therapy with high dose methotrexate with rituximab x 6 cycles.  Scheduled for port placement tomorrow. Pending PET scan results from 09/22/2021. Daughter notes patient continues to show poor judgement, when typing messages doesn't recognize severe spelling errors.   Reports new bladder/bowel urgency/incontinence, without saddle anesthesia, has some mild lumbar pain, reports ortho Dr. Perley Oliver lumbar xrays showed some disc space narrowing, pending MRI tomorrow. Denies extremity weakness or radicular sx. Describes burning R hip/lateral leg discomfort. Has been taking tylenol for back with benefit. Notes bil toe tingling sensation.   Wearing pull ups, some perineal/cleft irritation, started on barrier cream that is helping.   She follows with Derm Daisy Oliver annually for history of left cheek basal cell carcinoma.  BMI is Body mass index is 25.85 kg/m., she has not been working on diet/exercise Wt Readings from Last 3 Encounters:  09/23/21 150 lb 9.6 oz (68.3 kg)  09/16/21 155 lb 14.4 oz (70.7 kg)  09/02/21 149 lb 14.6 oz (68 kg)   Her blood pressure has been controlled at home, she is on benicar 77m, today their BP is BP: 122/72 She does workout  She denies chest pain, shortness of breath, dizziness.   She is not on cholesterol medication and denies myalgias. Her cholesterol is at goal. The cholesterol last visit was:  Lab Results  Component Value Date   CHOL 166 09/19/2020   HDL 64 09/19/2020   LDLCALC 81 09/19/2020   TRIG 113 09/19/2020   CHOLHDL 2.6 09/19/2020    Has stable CKD II; last gfr:  Lab Results  Component Value Date   EGFR 83 04/01/2021   Patient is on Vitamin D supplement, she stopped for several weeks and reduced from 4000 IU to 2000 IU daily  Lab Results  Component Value Date   VD25OH 102 (H) 04/01/2021   She is on thyroid medication, cytomel 520m (1065mBID). Her medication was not changed last visit.   Lab Results  Component Value Date   TSH 1.01 04/01/2021   She continues on daily supplement, known dose -  Lab Results   Component Value Date   VITAMINB12 >2,000 (H) 09/15/2018      Medication Review  Current Outpatient Medications (Endocrine & Metabolic):    liothyronine (CYTOMEL) 5 MCG tablet, Take 2 tablets (10 mcg total) by mouth 2 (two) times daily. (Patient taking differently: Take 10 mcg by mouth See admin instructions. Take 10 mcg by mouth before breakfast and lunch)  Current Outpatient Medications (Cardiovascular):    olmesartan (BENICAR) 20 MG tablet, Take 1/2 tablet  Daily  for BP  Current Outpatient Medications (Respiratory):    fexofenadine (ALLEGRA) 180 MG tablet, Take 1 tablet (180 mg total) by mouth daily.  Current Outpatient Medications (Analgesics):    acetaminophen (TYLENOL) 325 MG tablet, Take 650 mg by mouth every 6 (six) hours as needed (for pain).   traMADol (ULTRAM) 50 MG tablet, Take by mouth every 6 (six) hours as needed.  Current Outpatient Medications (Hematological):    Cyanocobalamin (B-12 PO), Place 1 tablet under the tongue in the morning.  Current Outpatient Medications (Other):    BIOTIN PO, Take 1 tablet by mouth daily.   Calcium-Magnesium-Vitamin D (CALCIUM 1200+D3 PO), Take 1 tablet by mouth daily.   clotrimazole-betamethasone (LOTRISONE) cream, Apply 1 application topically 2 (two) times daily.   Digestive Enzymes (BETAINE HCL PO), Take 1 capsule by mouth in the morning. Ortho Biotic  Ergocalciferol (VITAMIN D2) 2000 UNITS TABS, Take 2,000 Units by mouth daily.   erythromycin with ethanol (THERAMYCIN) 2 % external solution, Apply topically daily. (Patient taking differently: Apply 1 application  topically daily as needed (to insect bites).)   L-Methylfolate-Algae-B12-B6 (METANX PO), Take by mouth daily.   Magnesium 500 MG CAPS, Take 500 mg by mouth daily.   Misc Natural Products (OSTEO BI-FLEX JOINT SHIELD PO), Take 1 tablet by mouth at bedtime. Osteo Bi-Flex   NEOMYCIN-POLYMYXIN-HYDROCORTISONE (CORTISPORIN) 1 % SOLN OTIC solution, Place 6 drops into both ears  4 (four) times daily. (Patient taking differently: Place 6 drops into the right ear daily as needed ("for pain").)   NON FORMULARY, Apply 1 application  topically See admin instructions. CBD cream- Apply topically as needed for pain relief   NON FORMULARY, Take 1 capsule by mouth See admin instructions. Pure Encapsulations P5P 50  Vitamin B6 Supplement- Take 1 capsule by mouth once a day   Taurine 1000 MG CAPS, Take 1,000 mg by mouth daily.   tretinoin (RETIN-A) 0.05 % cream, Apply topically at bedtime. (Patient taking differently: Apply 1 application  topically every Monday, Wednesday, and Friday at 8 PM.)  Current Problems (verified) Patient Active Problem List   Diagnosis Date Noted   B12 deficiency 09/23/2021   Bowel and bladder incontinence 09/23/2021   Lumbar back pain 09/23/2021   Status post stereotactic brain biopsy 09/02/2021   Primary CNS lymphoma ([place]) 08/28/2021   Neoplasm of brain causing mass effect and brain compression on adjacent structures ([place]) 08/27/2021   Age-related osteoporosis without current pathological fracture 10/10/2020   CKD (chronic kidney disease) stage 2, GFR 60-89 ml/min 03/29/2020   History of basal cell cancer 03/17/2018   Vitamin D deficiency 08/28/2014   Medication management 08/28/2014   Hypothyroidism 08/28/2014   Hypertension 08/25/2013   Hyperlipidemia 08/25/2013   Migraines 08/25/2013   Rosacea 08/25/2013   Screening Test and Preventative care:  Immunization History  Administered Date(s) Administered   DT (Pediatric) 08/28/2014   Fluad Quad(high Dose 65+) 04/02/2021   Influenza, High Dose Seasonal PF 04/11/2019   PFIZER(Purple Top)SARS-COV-2 Vaccination 05/10/2019, 05/31/2019   Pneumococcal Conjugate-13 08/28/2015   Pneumococcal Polysaccharide-23 03/08/2017   Last colonoscopy: 11/2013 DONE Last mammogram: 11/07/2020, has phone number to schedule but plans to post-pone pending chemo Last pap smear/pelvic exam: 2010, DONE DEXA:  10/11/2020 + osteoporosis t -2.6 < 2.8 Dr. Melford Aase sent in alendronate but patient preference to not start, working on supplements and exercise, recheck in 2 years  Names of Other Physician/Practitioners you currently use: 1. [facility]- here for primary care 2. Dr. Herbert Deaner, eye doctor, last visit TODAY 3.  Microbiologist, last visit 2022  Patient Care Team: [name], MD as PCP - General (Internal Medicine) Izora Gala, MD as Consulting Physician (Otolaryngology) Michel Santee, MD as Consulting Physician (Neurology) [place] as Nurse Navigator [facility] and Palliative Medicine)   Allergies Allergies  Allergen Reactions   Barbiturates Other (See Comments)    Reaction not cited   Other Other (See Comments)    Patient has AIP and is not to have hormones or red wine   Pentothal [Thiopental] Other (See Comments)    Reaction not cited   Sulfa Antibiotics Other (See Comments)    Reaction not cited   Wheat Bran Nausea And Vomiting    SURGICAL HISTORY She  has a past surgical history that includes Radial head arthroplasty (Right, 09/16/2012); Cataract extraction, bilateral (2020);  Frameless  biopsy with brainlab (Right, 07/04/5571); and Application of cranial navigation (Right, 09/02/2021). FAMILY HISTORY Her family history includes Arthritis in her sister; Brain cancer (age of onset: 71) in her father; Cancer in her paternal grandfather; Heart disease in her mother; Hypertension in her sister and sister; Osteoporosis in her sister and sister; Scoliosis in her sister; Suicidality in her paternal grandfather. SOCIAL HISTORY She  reports that she quit smoking about 38 years ago. Her smoking use included cigarettes. She has a 44.00 pack-year smoking history. She has never used smokeless tobacco. She reports that she does not currently use alcohol. She reports that she does not use drugs.   Review of Systems   Constitutional:  Negative for chills, fever, malaise/fatigue and weight loss.  HENT:  Negative for hearing loss and tinnitus.   Eyes:  Negative for blurred vision and double vision.  Respiratory:  Negative for cough, sputum production, shortness of breath and wheezing.   Cardiovascular:  Negative for chest pain, palpitations, orthopnea, claudication, leg swelling and PND.  Gastrointestinal:  Negative for abdominal pain, blood in stool, constipation, diarrhea, heartburn, melena, nausea and vomiting.       Fecal incontinence, new  Genitourinary:  Negative for dysuria, flank pain, frequency, hematuria and urgency.       Urinary incontinence, new  Musculoskeletal:  Positive for back pain and joint pain (knee, ortho following). Negative for falls and myalgias.  Skin:  Negative for rash.  Neurological:  Positive for tingling (bil toes). Negative for dizziness, tremors, sensory change, speech change, focal weakness, seizures, loss of consciousness, weakness and headaches.       Poor judgement,   Endo/Heme/Allergies:  Negative for polydipsia.  Psychiatric/Behavioral: Negative.  Negative for depression, memory loss, substance abuse and suicidal ideas. The patient is not nervous/anxious and does not have insomnia.   All other systems reviewed and are negative.    Objective:     Blood pressure 122/72, pulse 70, temperature (!) 97.1 F (36.2 C), height '5\' 4"'  (1.626 m), weight 150 lb 9.6 oz (68.3 kg), SpO2 97 %. Body mass index is 25.85 kg/m.  General appearance: alert, no distress, WD/WN,  female HEENT: Pupils round, symmetrical (currently dilated by ophth); EOMs symmetrical and intact; normocephalic, sclerae anicteric, TMs pearly, nares patent, no discharge or erythema, pharynx normal Oral cavity: MMM, no lesions Neck: supple, no lymphadenopathy, no thyromegaly, no masses Heart: RRR, normal S1, S2, no murmurs Lungs: CTA bilaterally, no wheezes, rhonchi, or rales Abdomen: +bs, soft, non tender,  non distended, no masses, no hepatomegaly, no splenomegaly Musculoskeletal: nontender, no swelling, no obvious deformity Extremities: no edema, no cyanosis, no clubbing Pulses: 2+ symmetric, upper and lower extremities, normal cap refill Neurological: alert, oriented x 3, CN2-12 intact, strength essentially symmetrical/normal upper extremities and lower extremities, no cerebellar signs, gait slow with cane, sensation mildly diminished right toes/dorsum to ankle, otherwise intact Skin: sparse eyebrows bilaterally Psychiatric: normal affect, behavior normal, pleasant  Rectal: external hemorrhoids with tags; mild intertriginous rash to gluteal cleft without breakdown   EKG: defer - has had several recently   Izora Ribas, NP   09/23/2021

## 2021-09-24 ENCOUNTER — Other Ambulatory Visit: Payer: Self-pay | Admitting: Adult Health

## 2021-09-24 ENCOUNTER — Ambulatory Visit (HOSPITAL_COMMUNITY)
Admission: RE | Admit: 2021-09-24 | Discharge: 2021-09-24 | Disposition: A | Discharge status: Home / Self Care | Payer: Medicare PPO | Source: Ambulatory Visit | Attending: Internal Medicine | Admitting: Internal Medicine

## 2021-09-24 ENCOUNTER — Encounter: Payer: Self-pay | Admitting: Adult Health

## 2021-09-24 ENCOUNTER — Encounter (INDEPENDENT_AMBULATORY_CARE_PROVIDER_SITE_OTHER): Payer: Medicare PPO | Admitting: Ophthalmology

## 2021-09-24 ENCOUNTER — Encounter: Payer: Self-pay | Admitting: Internal Medicine

## 2021-09-24 DIAGNOSIS — N189 Chronic kidney disease, unspecified: Secondary | ICD-10-CM | POA: Insufficient documentation

## 2021-09-24 DIAGNOSIS — Z87891 Personal history of nicotine dependence: Secondary | ICD-10-CM | POA: Diagnosis not present

## 2021-09-24 DIAGNOSIS — K219 Gastro-esophageal reflux disease without esophagitis: Secondary | ICD-10-CM | POA: Insufficient documentation

## 2021-09-24 DIAGNOSIS — I129 Hypertensive chronic kidney disease with stage 1 through stage 4 chronic kidney disease, or unspecified chronic kidney disease: Secondary | ICD-10-CM | POA: Insufficient documentation

## 2021-09-24 DIAGNOSIS — E039 Hypothyroidism, unspecified: Secondary | ICD-10-CM | POA: Insufficient documentation

## 2021-09-24 DIAGNOSIS — Z452 Encounter for adjustment and management of vascular access device: Secondary | ICD-10-CM | POA: Diagnosis not present

## 2021-09-24 DIAGNOSIS — C8589 Other specified types of non-Hodgkin lymphoma, extranodal and solid organ sites: Secondary | ICD-10-CM | POA: Insufficient documentation

## 2021-09-24 DIAGNOSIS — E538 Deficiency of other specified B group vitamins: Secondary | ICD-10-CM | POA: Diagnosis not present

## 2021-09-24 DIAGNOSIS — M810 Age-related osteoporosis without current pathological fracture: Secondary | ICD-10-CM | POA: Insufficient documentation

## 2021-09-24 DIAGNOSIS — I700 Atherosclerosis of aorta: Secondary | ICD-10-CM | POA: Insufficient documentation

## 2021-09-24 HISTORY — PX: IR IMAGING GUIDED PORT INSERTION: IMG5740

## 2021-09-24 LAB — CBC WITH DIFFERENTIAL/PLATELET
Absolute Monocytes: 660 cells/uL (ref 200–950)
Basophils Absolute: 57 cells/uL (ref 0–200)
Basophils Relative: 0.8 %
Eosinophils Absolute: 149 cells/uL (ref 15–500)
Eosinophils Relative: 2.1 %
HCT: 41.3 % (ref 35.0–45.0)
Hemoglobin: 13.9 g/dL (ref 11.7–15.5)
Lymphs Abs: 1470 cells/uL (ref 850–3900)
MCH: 31.7 pg (ref 27.0–33.0)
MCHC: 33.7 g/dL (ref 32.0–36.0)
MCV: 94.3 fL (ref 80.0–100.0)
MPV: 10.1 fL (ref 7.5–12.5)
Monocytes Relative: 9.3 %
Neutro Abs: 4764 cells/uL (ref 1500–7800)
Neutrophils Relative %: 67.1 %
Platelets: 156 10*3/uL (ref 140–400)
RBC: 4.38 10*6/uL (ref 3.80–5.10)
RDW: 13.1 % (ref 11.0–15.0)
Total Lymphocyte: 20.7 %
WBC: 7.1 10*3/uL (ref 3.8–10.8)

## 2021-09-24 LAB — COMPLETE METABOLIC PANEL WITH GFR
AG Ratio: 1.4 (calc) (ref 1.0–2.5)
ALT: 18 U/L (ref 6–29)
AST: 22 U/L (ref 10–35)
Albumin: 4.1 g/dL (ref 3.6–5.1)
Alkaline phosphatase (APISO): 46 U/L (ref 37–153)
BUN: 22 mg/dL (ref 7–25)
CO2: 28 mmol/L (ref 20–32)
Calcium: 9.6 mg/dL (ref 8.6–10.4)
Chloride: 102 mmol/L (ref 98–110)
Creat: 0.69 mg/dL (ref 0.60–0.95)
Globulin: 2.9 g/dL (calc) (ref 1.9–3.7)
Glucose, Bld: 84 mg/dL (ref 65–99)
Potassium: 3.9 mmol/L (ref 3.5–5.3)
Sodium: 140 mmol/L (ref 135–146)
Total Bilirubin: 0.8 mg/dL (ref 0.2–1.2)
Total Protein: 7 g/dL (ref 6.1–8.1)
eGFR: 86 mL/min/{1.73_m2} (ref >=60)

## 2021-09-24 LAB — LIPID PANEL
Cholesterol: 182 mg/dL (ref <200)
HDL: 83 mg/dL (ref >=50)
LDL Cholesterol (Calc): 83 mg/dL (calc)
Non-HDL Cholesterol (Calc): 99 mg/dL (calc) (ref <130)
Total CHOL/HDL Ratio: 2.2 (calc) (ref <5.0)
Triglycerides: 82 mg/dL (ref <150)

## 2021-09-24 LAB — HEMOGLOBIN A1C
Hgb A1c MFr Bld: 5.3 % of total Hgb (ref <5.7)
Mean Plasma Glucose: 105 mg/dL
eAG (mmol/L): 5.8 mmol/L

## 2021-09-24 LAB — VITAMIN D 25 HYDROXY (VIT D DEFICIENCY, FRACTURES): Vit D, 25-Hydroxy: >150 ng/mL — ABNORMAL HIGH (ref 30–100)

## 2021-09-24 LAB — MAGNESIUM: Magnesium: 2.1 mg/dL (ref 1.5–2.5)

## 2021-09-24 LAB — VITAMIN B12: Vitamin B-12: >2000 pg/mL — ABNORMAL HIGH (ref 200–1100)

## 2021-09-24 LAB — TSH: TSH: 0.18 mIU/L — ABNORMAL LOW (ref 0.40–4.50)

## 2021-09-24 MED ORDER — LIDOCAINE-EPINEPHRINE 1 %-1:100000 IJ SOLN
INTRAMUSCULAR | Status: AC
  Filled 2021-09-24: qty 1

## 2021-09-24 MED ORDER — MIDAZOLAM HCL 2 MG/2ML IJ SOLN
INTRAMUSCULAR | Status: AC | PRN
  Administered 2021-09-24: .5 mg via INTRAVENOUS

## 2021-09-24 MED ORDER — LIDOCAINE-EPINEPHRINE 1 %-1:100000 IJ SOLN
INTRAMUSCULAR | Status: AC | PRN
  Administered 2021-09-24: 10 mL via INTRADERMAL

## 2021-09-24 MED ORDER — FENTANYL CITRATE (PF) 100 MCG/2ML IJ SOLN
INTRAMUSCULAR | Status: AC | PRN
  Administered 2021-09-24: 25 ug via INTRAVENOUS

## 2021-09-24 MED ORDER — SODIUM CHLORIDE 0.9 % IV SOLN: INTRAVENOUS | Status: DC

## 2021-09-24 MED ORDER — FENTANYL CITRATE (PF) 100 MCG/2ML IJ SOLN
INTRAMUSCULAR | Status: AC
  Filled 2021-09-24: qty 2

## 2021-09-24 MED ORDER — MIDAZOLAM HCL 2 MG/2ML IJ SOLN
INTRAMUSCULAR | Status: AC
  Filled 2021-09-24: qty 2

## 2021-09-24 MED ORDER — HEPARIN SOD (PORK) LOCK FLUSH 100 UNIT/ML IV SOLN
INTRAVENOUS | Status: AC | PRN
  Administered 2021-09-24: 500 [IU] via INTRAVENOUS

## 2021-09-24 MED ORDER — HEPARIN SOD (PORK) LOCK FLUSH 100 UNIT/ML IV SOLN
INTRAVENOUS | Status: AC
  Filled 2021-09-24: qty 5

## 2021-09-24 MED ORDER — LIOTHYRONINE SODIUM 5 MCG PO TABS: 10.0000 ug | ORAL_TABLET | ORAL | 3 refills | Status: DC

## 2021-09-24 NOTE — Consult Note (Signed)
Chief Complaint: Patient was seen in consultation today for  port a cath placement  Referring Physician(s): Vaslow,Zachary K  Supervising Physician: Ruthann Cancer  Patient Status: [facility] - Out-pt  History of Present Illness: Daisy Oliver is an 86 y.o. female ex smoker with PMH sig for acute intermittent porphyria, GERD, HTN, basal cell skin cancer, B12 deficiency, chronic kidney disease, osteoporosis, hypothyroidism and now with newly diagnosed right frontal  CNS lymphoma. She presents today for port a cath placement to assist with treatment.  Past Medical History:  Diagnosis Date   Acute intermittent porphyria ([place])    diagnosed at age 37   COVID-19 08/26/2020   Fracture of radial head, right, closed 09/16/2012   GERD (gastroesophageal reflux disease)    pepcid prn   Hypertension    Hypothyroidism    PONV (postoperative nausea and vomiting)     Past Surgical History:  Procedure Laterality Date   APPLICATION OF CRANIAL NAVIGATION Right 09/02/2021   Procedure: APPLICATION OF CRANIAL NAVIGATION;  Surgeon: Vallarie Mare, MD;  Location: [place];  Service: Neurosurgery;  Laterality: Right;   CATARACT EXTRACTION, BILATERAL  2020   Dr. Herbert Deaner   FRAMELESS  BIOPSY WITH BRAINLAB Right 09/02/2021   Procedure: FRONTAL STEREOTACTIC BRAIN BIOPSY;  Surgeon: Vallarie Mare, MD;  Location: [place];  Service: Neurosurgery;  Laterality: Right;   RADIAL HEAD ARTHROPLASTY Right 09/16/2012   Procedure: RADIAL HEAD ARTHROPLASTY;  Surgeon: Johnny Bridge, MD;  Location: [place];  Service: Orthopedics;  Laterality: Right;  RADIAL HEAD REPLACEMENT      Allergies: Barbiturates, Other, Pentothal [thiopental], Sulfa antibiotics, and Wheat bran  Medications: Prior to Admission medications   Medication Sig Start Date End Date Taking? Authorizing Provider  acetaminophen (TYLENOL) 325 MG tablet Take 650 mg by mouth every 6 (six) hours as needed (for pain).   Yes [provider]  BIOTIN PO Take 1 tablet by mouth daily.   Yes [provider]  Calcium-Magnesium-Vitamin D (CALCIUM 1200+D3 PO) Take 1 tablet by mouth daily.   Yes [provider]  Digestive Enzymes (BETAINE HCL PO) Take 1 capsule by mouth in the morning. Ortho Biotic   Yes [provider]  L-Methylfolate-Algae-B12-B6 (METANX PO) Take by mouth daily.   Yes [provider]  liothyronine (CYTOMEL) 5 MCG tablet Take 2 tablets (10 mcg total) by mouth See admin instructions. Take 10 mcg before breakfast. 09/24/21  Yes Corbett, Caryl Pina, NP  Magnesium 500 MG CAPS Take 500 mg by mouth daily.   Yes [provider]  Misc Natural Products (OSTEO BI-FLEX JOINT SHIELD PO) Take 1 tablet by mouth at bedtime. Osteo Bi-Flex   Yes [provider]  NON FORMULARY Apply 1 application  topically See admin instructions. CBD cream- Apply topically as needed for pain relief   Yes [provider]  NON FORMULARY Take 1 capsule by mouth See admin instructions. Pure Encapsulations P5P 50  Vitamin B6 Supplement- Take 1 capsule by mouth once a day   Yes [provider]  olmesartan (BENICAR) 20 MG tablet Take 1/2 tablet  Daily  for BP 08/13/21  Yes Liane Comber, NP  Taurine 1000 MG CAPS Take 1,000 mg by mouth daily.   Yes [provider]  traMADol (ULTRAM) 50 MG tablet Take by mouth every 6 (six) hours as needed.   Yes [provider]  tretinoin (RETIN-A) 0.05 % cream Apply topically at bedtime. Patient taking differently: Apply 1 application  topically every Monday,  Wednesday, and Friday at 8 PM. 03/17/18  Yes Vladimir Crofts, PA-C  clotrimazole-betamethasone (LOTRISONE) cream Apply 1 application topically 2 (two) times daily. 09/14/17   Liane Comber, NP  erythromycin with ethanol [facility]) 2 % external solution Apply topically daily. Patient taking differently: Apply 1 application  topically daily as needed (to insect bites). 09/23/18    Vladimir Crofts, PA-C  fexofenadine (ALLEGRA) 180 MG tablet Take 1 tablet (180 mg total) by mouth daily. 07/23/21   Liane Comber, NP  NEOMYCIN-POLYMYXIN-HYDROCORTISONE (CORTISPORIN) 1 % SOLN OTIC solution Place 6 drops into both ears 4 (four) times daily. Patient taking differently: Place 6 drops into the right ear daily as needed ("for pain"). 09/23/18   Vladimir Crofts, PA-C     Family History  Problem Relation Age of Onset   Heart disease Mother    Brain cancer Father 24       Brain tumor, not cancerous   Hypertension Sister    Osteoporosis Sister    Arthritis Sister    Scoliosis Sister    Hypertension Sister    Osteoporosis Sister    Cancer Paternal Grandfather    Suicidality Paternal Grandfather    Breast cancer Neg Hx     Social History   Socioeconomic History   Marital status: Married    Spouse name: Not on file   Number of children: 2   Years of education: Not on file   Highest education level: Not on file  Occupational History   Not on file  Tobacco Use   Smoking status: Former    Packs/day: 2.00    Years: 22.00    Total pack years: 44.00    Types: Cigarettes    Quit date: 09/17/1983    Years since quitting: 38.0   Smokeless tobacco: Never  Vaping Use   Vaping Use: Never used  Substance and Sexual Activity   Alcohol use: Not Currently    Alcohol/week: 0.0 - 1.0 standard drinks of alcohol    Comment: 2/month   Drug use: No   Sexual activity: Not Currently  Other Topics Concern   Not on file  Social History Narrative   Not on file   Social Determinants of Health   Financial Resource Strain: Not on file  Food Insecurity: Not on file  Transportation Needs: Not on file  Physical Activity: Sufficiently Active (09/14/2017)   Exercise Vital Sign    Days of Exercise per Week: 5 days    Minutes of Exercise per Session: 60 min  Stress: No Stress Concern Present (09/14/2017)   [place]     Feeling of Stress : Only a little  Social Connections: Not on file      Review of Systems currently denies fever, chest pain, worsening dyspnea, cough, abdominal pain, nausea, vomiting or bleeding.  She does have intermittent headaches, back pain and bilateral lateral leg pain; also with known history of gait instability and uses cane to assist with ambulation  Vital Signs: BP 130/62 (BP Location: Right Arm)   Pulse (!) 55   Temp 97.9 F (36.6 C) (Oral)   Resp 18   SpO2 100%   Physical Exam awake, alert.  Chest clear to auscultation bilaterally.  Heart with regular rate and rhythm.  Abdomen soft, positive bowel sounds, nontender.  No lower extremity edema.  Imaging: NM PET Image Initial (PI) Whole Body  Result Date: 09/24/2021 CLINICAL DATA:  Initial treatment strategy for lymphoma. EXAM: NUCLEAR MEDICINE  PET WHOLE BODY TECHNIQUE: 7.8 mCi F-18 FDG was injected intravenously. Full-ring PET imaging was performed from the head to foot after the radiotracer. CT data was obtained and used for attenuation correction and anatomic localization. Fasting blood glucose: 92 mg/dl COMPARISON:  CT chest, abdomen and pelvis dated Aug 28, 2021. FINDINGS: Mediastinal blood pool activity: SUV max 2.2 HEAD/NECK: Focal hypermetabolic activity seen in the right frontal lobe and right basal ganglia. No FDG avid cervical lymph nodes. Incidental CT findings: none CHEST: Hypermetabolic left lower lobe pulmonary nodule measuring 1.4 x 1.4 cm on series 7, image 49 with an SUV max of 5.6, finding is new when compared with Aug 28, 2021 exam. No hypermetabolic lymph nodes seen in the chest. Incidental CT findings: Mild atherosclerotic disease of the thoracic aorta. ABDOMEN/PELVIS: No abnormal hypermetabolic activity within the liver, pancreas, adrenal glands, or spleen. Mildly hypermetabolic subcentimeter right inguinal lymph node measuring mm in short axis on series 4, image 200 with SUV max of 3.6, unchanged in size when  compared to prior exam. Incidental CT findings: Splenic calcifications, likely sequela of prior granulomatous infection. Diverticulosis. Atherosclerotic disease of the abdominal aorta. SKELETON: No focal hypermetabolic activity to suggest skeletal metastasis. Incidental CT findings: none EXTREMITIES: No abnormal hypermetabolic activity in the lower extremities. Incidental CT findings: none IMPRESSION: 1. Focal hypermetabolic activity seen in the right frontal lobe and right basal ganglia, compatible with known CNS lesions. 2. Hypermetabolic left lower lobe pulmonary nodule which is new when compared with Aug 28, 2021 prior and potentially infectious. Recommend short-term follow-up CT in 3 months for further evaluation. 3. Mildly hypermetabolic subcentimeter right inguinal lymph node, potentially reactive. Recommend attention on follow-up. Electronically Signed   By: Yetta Glassman M.D.   On: 09/24/2021 08:40   CT HEAD WO CONTRAST  Result Date: 09/03/2021 CLINICAL DATA:  Brain/CNS neoplasm, assess treatment response EXAM: CT HEAD WITHOUT CONTRAST TECHNIQUE: Contiguous axial images were obtained from the base of the skull through the vertex without intravenous contrast. RADIATION DOSE REDUCTION: This exam was performed according to the departmental dose-optimization program which includes automated exposure control, adjustment of the mA and/or kV according to patient size and/or use of iterative reconstruction technique. COMPARISON:  CT head May 17, 23. MRI head Aug 28, 2021 and Aug 27, 2021. FINDINGS: Brain: Postsurgical changes of right frontal burr hole and biopsy with small volume of intraparenchymal and extra-axial hemorrhage and gas in this region. Redemonstrated multiple masses, largest in the right basal ganglia and better characterized on prior MRI. Similar extensive surrounding vasogenic edema. Mass effect is similar versus slightly increased with 9 mm of leftward midline shift. No evidence of acute  large vascular territory infarct or hydrocephalus. Vascular: No hyperdense vessel identified. Skull: Right frontal burr hole. Little bit of overlying gas/edema in the scalp. No acute fracture. Sinuses/Orbits: Clear sinuses. Other: No mastoid effusions. IMPRESSION: 1. Postsurgical changes of right frontal biopsy with small volume of intraparenchymal and extra-axial hemorrhage and gas in this region. 2. Redemonstrated multiple masses, largest in the right basal ganglia and better characterized on recent MRI. Similar extensive surrounding vasogenic edema. Mass effect is similar versus slightly increased with 9 mm of leftward midline shift. Electronically Signed   By: Margaretha Sheffield M.D.   On: 09/03/2021 09:38   MR BRAIN W WO CONTRAST  Result Date: 08/28/2021 CLINICAL DATA:  Intracranial neoplasm planning for biopsy EXAM: MRI HEAD WITHOUT AND WITH CONTRAST TECHNIQUE: Multiplanar, multiecho pulse sequences of the brain and surrounding structures were obtained without and  with intravenous contrast. CONTRAST:  75m GADAVIST GADOBUTROL 1 MMOL/ML IV SOLN COMPARISON:  08/27/2021 FINDINGS: Brain: Dominant contrast enhancing mass centered in the right basal ganglia is unchanged. A satellite lesion at the anterior right frontal lobe is also unchanged. Unchanged large amount of surrounding edema causing 8 mm of leftward midline shift. Vascular: Major flow voids are preserved. Skull and upper cervical spine: Normal calvarium and skull base. Visualized upper cervical spine and soft tissues are normal. Sinuses/Orbits:No paranasal sinus fluid levels or advanced mucosal thickening. No mastoid or middle ear effusion. Normal orbits. IMPRESSION: 1. Unchanged size and appearance of dominant mass centered in the right basal ganglia with large amount of surrounding edema and 8 mm of leftward midline shift. 2. Satellite lesion at the anterior right frontal lobe is also unchanged. Lymphoma is most likely. Electronically Signed   By:  KUlyses JarredM.D.   On: 08/28/2021 22:03   CT CHEST ABDOMEN PELVIS W CONTRAST  Result Date: 08/28/2021 CLINICAL DATA:  CNS neoplasm, evaluate for metastatic disease EXAM: CT CHEST, ABDOMEN, AND PELVIS WITH CONTRAST TECHNIQUE: Multidetector CT imaging of the chest, abdomen and pelvis was performed following the standard protocol during bolus administration of intravenous contrast. RADIATION DOSE REDUCTION: This exam was performed according to the departmental dose-optimization program which includes automated exposure control, adjustment of the mA and/or kV according to patient size and/or use of iterative reconstruction technique. CONTRAST:  1059mOMNIPAQUE IOHEXOL 300 MG/ML  SOLN COMPARISON:  None Available. FINDINGS: CT CHEST FINDINGS Cardiovascular: The heart is normal in size. No pericardial effusion. No evidence of thoracic aortic aneurysm. Mild atherosclerotic calcifications of the arch. Mild three-vessel coronary sclerosis. Mediastinum/Nodes: No suspicious thoracic lymphadenopathy. Visualized thyroid is unremarkable. Lungs/Pleura: Mild subpleural reticulation/fibrosis in the lungs bilaterally, lower lobe predominant, favoring mild chronic interstitial lung disease. Superimposed mild dependent atelectasis in the bilateral lower lobes. No suspicious pulmonary nodules. No focal consolidation. No pleural effusion or pneumothorax. Musculoskeletal: Degenerative changes of the thoracic spine. CT ABDOMEN PELVIS FINDINGS Hepatobiliary: Liver is within normal limits. Gallbladder is unremarkable. No intrahepatic or extrahepatic ductal dilatation. Pancreas: Within normal limits. Spleen: Calcified splenic granulomata. Adrenals/Urinary Tract: Adrenal glands are within normal limits. 5 mm right upper pole renal cyst (series 3/image 61). Left kidney is within normal limits. No hydronephrosis. Bladder is within normal limits. Stomach/Bowel: Stomach is within normal limits. No evidence of bowel obstruction. Appendix is  not discretely visualized. Sigmoid diverticulosis, without evidence of diverticulitis. Vascular/Lymphatic: No evidence of abdominal aortic aneurysm. Atherosclerotic calcifications of the abdominal aorta and branch vessels. No suspicious abdominopelvic lymphadenopathy. Reproductive: Uterus is within normal limits. No adnexal masses. Other: No abdominopelvic ascites. Musculoskeletal: Degenerative changes of the lumbar spine. IMPRESSION: No findings suspicious for metastatic disease in the chest, abdomen, or pelvis. Additional ancillary findings as above. Electronically Signed   By: SrJulian Hy.D.   On: 08/28/2021 01:10   MR BRAIN W WO CONTRAST  Result Date: 08/27/2021 CLINICAL DATA:  Brain mass.  Abnormal CT today. EXAM: MRI HEAD WITHOUT AND WITH CONTRAST TECHNIQUE: Multiplanar, multiecho pulse sequences of the brain and surrounding structures were obtained without and with intravenous contrast. CONTRAST:  32m25mADAVIST GADOBUTROL 1 MMOL/ML IV SOLN COMPARISON:  CT head 08/27/2021.  MRI head 02/01/2007 FINDINGS: Brain: Large infiltrating mass in the right frontal and temporal lobe. There are areas of edema within the mass and areas of enhancement within the mass. The areas of enhancement show restricted diffusion. There is mass-effect and 8 mm midline shift to the left. There is  compression of the right lateral ventricle. No hydrocephalus. Multiple areas of enhancement including the right anterior frontal lobe, right insula, right basal ganglia. Masslike is relatively solid without areas of necrosis. The mass appears to be centered in the right basal ganglia. No associated hemorrhage. No abnormal enhancement or edema in the left hemisphere. Brainstem and cerebellum normal. Vascular: Normal arterial flow voids. Skull and upper cervical spine: No focal lesion. Sinuses/Orbits: Paranasal sinuses clear. Bilateral cataract extraction Other: None IMPRESSION: Large infiltrating mass in the right frontal and temporal  lobe extending back towards the parietal lobe. There are areas of nonenhancing edema as well as nodular areas of enhancement. The epicenter appears to be in the right basal ganglia. 8 mm midline shift to the left. Findings most consistent with primary CNS neoplasm. CNS lymphoma is a possibility given the homogeneous enhancement and lack of necrosis. Glioblastoma also possible. Tissue sampling will be necessary for diagnosis. Electronically Signed   By: Franchot Gallo M.D.   On: 08/27/2021 17:55   CT HEAD WO CONTRAST (5MM)  Addendum Date: 08/27/2021   ADDENDUM REPORT: 08/27/2021 11:51 ADDENDUM: Findings discussed with Liane Comber, NP via telephone at 11:42 a.m. Additionally, I spoke with the technologist Marzetta Board and the patient will be transported to the emergency department for management. Electronically Signed   By: Margaretha Sheffield M.D.   On: 08/27/2021 11:51   Result Date: 08/27/2021 CLINICAL DATA:  Dizziness, non-specific Dizziness, non-specific; progressive unsteady gait, atypical frequent falls, difficulty focusing, now with new headaches, progresive 8 weeks, r/o NPH EXAM: CT HEAD WITHOUT CONTRAST TECHNIQUE: Contiguous axial images were obtained from the base of the skull through the vertex without intravenous contrast. RADIATION DOSE REDUCTION: This exam was performed according to the departmental dose-optimization program which includes automated exposure control, adjustment of the mA and/or kV according to patient size and/or use of iterative reconstruction technique. COMPARISON:  None Available. FINDINGS: Brain: Extensive vasogenic edema centered in the right frontal lobe but also involving the anterior parietal lobe and temporal lobes. Probable mass lesion in the right basal ganglia region and overlying frontal lobe. Resulting mass effect with effacement the right lateral ventricle and approximately 8 mm of leftward midline shift. No evidence of hydrocephalus. No clear evidence of acute  hemorrhage. Basal cisterns are patent. Vascular: No hyperdense vessel identified. Calcific intracranial atherosclerosis Skull: No acute fracture. Sinuses/Orbits: Clear sinuses.  No acute orbital findings. Other: No mastoid effusions. IMPRESSION: Extensive vasogenic edema in the right frontal, parietal and temporal lobes with probable mass in the right basal ganglia and overlying frontal lobe, concerning for malignancy but not well evaluated. Resulting mass effect with approximately 8 mm of leftward midline shift and effacement the right lateral ventricle. Recommend MRI with contrast to further evaluate. Also, recommend urgent neurosurgical consultation. Electronically Signed: By: Margaretha Sheffield M.D. On: 08/27/2021 11:35    Labs:  CBC: Recent Labs    04/01/21 1443 08/27/21 1251 08/27/21 1314 08/28/21 0417 09/23/21 1611  WBC 7.5 8.3  --  8.6 7.1  HGB 13.7 13.6 13.9 13.0 13.9  HCT 40.7 41.0 41.0 37.1 41.3  PLT 214 189  --  189 156    COAGS: Recent Labs    08/27/21 1251  INR 1.1  APTT 25    BMP: Recent Labs    04/01/21 1443 08/27/21 1251 08/27/21 1314 08/28/21 0417 09/23/21 1611  NA 143 139 138 135 140  K 5.0 4.2 4.2 3.5 3.9  CL 101 101 104 104 102  CO2 32 28  --  26 28  GLUCOSE 117* 105* 103* 133* 84  BUN 31* '21 23 21 22  '$ CALCIUM 10.3 9.7  --  9.2 9.6  CREATININE 0.72 0.79 0.80 0.82 0.69  GFRNONAA  --  >60  --  >60  --     LIVER FUNCTION TESTS: Recent Labs    04/01/21 1443 08/27/21 1251 08/28/21 0417 09/23/21 1611  BILITOT 0.7 1.2 0.7 0.8  AST 23 38 36 22  ALT '16 28 30 18  '$ ALKPHOS  --  43 42  --   PROT 7.9 7.1 6.5 7.0  ALBUMIN  --  3.9 3.3*  --     TUMOR MARKERS: No results for input(s): "AFPTM", "CEA", "CA199", "CHROMGRNA" in the last 8760 hours.  Assessment and Plan: 86 y.o. female ex smoker with PMH sig for acute intermittent porphyria, GERD, HTN, basal cell skin cancer, B12 deficiency, chronic kidney disease, osteoporosis, hypothyroidism and now  with newly diagnosed right frontal  CNS lymphoma. She presents today for port a cath placement to assist with treatment.Risks and benefits of image guided port-a-catheter placement was discussed with the patient including, but not limited to bleeding, infection, pneumothorax, or fibrin sheath development and need for additional procedures.  All of the patient's questions were answered, patient is agreeable to proceed. Consent signed and in chart.    Thank you for this interesting consult.  I greatly enjoyed meeting Daisy Oliver and look forward to participating in their care.  A copy of this report was sent to the requesting provider on this date.  Electronically Signed: D. Rowe Robert, PA-C 09/24/2021, 1:43 PM   I spent a total of   25 minutes  in face to face in clinical consultation, greater than 50% of which was counseling/coordinating care for Port-A-Cath placement

## 2021-09-24 NOTE — Procedures (Signed)
Interventional Radiology Procedure Note ° °Procedure: Single Lumen Power Port Placement   ° °Access:  Right internal jugular vein ° °Findings: Catheter tip positioned at cavoatrial junction. Port is ready for immediate use.  ° °Complications: None ° °EBL: < 10 mL ° °Recommendations:  °- Ok to shower in 24 hours °- Do not submerge for 7 days °- Routine line care  ° ° °Taniaya Rudder, MD ° ° ° °

## 2021-09-25 ENCOUNTER — Ambulatory Visit (HOSPITAL_COMMUNITY)
Admission: RE | Admit: 2021-09-25 | Discharge: 2021-09-25 | Disposition: A | Discharge status: Home / Self Care | Payer: Medicare PPO | Source: Ambulatory Visit | Attending: Internal Medicine | Admitting: Internal Medicine

## 2021-09-25 DIAGNOSIS — C8589 Other specified types of non-Hodgkin lymphoma, extranodal and solid organ sites: Secondary | ICD-10-CM | POA: Diagnosis not present

## 2021-09-25 DIAGNOSIS — M50222 Other cervical disc displacement at C5-C6 level: Secondary | ICD-10-CM | POA: Diagnosis not present

## 2021-09-25 DIAGNOSIS — M4802 Spinal stenosis, cervical region: Secondary | ICD-10-CM | POA: Diagnosis not present

## 2021-09-25 MED ORDER — GADOBUTROL 1 MMOL/ML IV SOLN
6.0000 mL | Freq: Once | INTRAVENOUS | Status: AC | PRN
  Administered 2021-09-25: 6 mL via INTRAVENOUS

## 2021-09-26 ENCOUNTER — Encounter: Payer: Self-pay | Admitting: Internal Medicine

## 2021-09-29 ENCOUNTER — Telehealth: Payer: Self-pay

## 2021-09-29 ENCOUNTER — Inpatient Hospital Stay (HOSPITAL_COMMUNITY)
Admission: AD | Admit: 2021-09-29 | Discharge: 2021-10-03 | DRG: 847 | Disposition: A | Discharge status: Home / Self Care | Payer: Medicare PPO | Source: Ambulatory Visit | Attending: Internal Medicine | Admitting: Internal Medicine

## 2021-09-29 ENCOUNTER — Other Ambulatory Visit: Payer: Self-pay | Admitting: Adult Health

## 2021-09-29 ENCOUNTER — Inpatient Hospital Stay: Payer: Medicare PPO

## 2021-09-29 DIAGNOSIS — Z9842 Cataract extraction status, left eye: Secondary | ICD-10-CM

## 2021-09-29 DIAGNOSIS — C8339 Diffuse large B-cell lymphoma, extranodal and solid organ sites: Secondary | ICD-10-CM | POA: Diagnosis present

## 2021-09-29 DIAGNOSIS — Z808 Family history of malignant neoplasm of other organs or systems: Secondary | ICD-10-CM | POA: Diagnosis not present

## 2021-09-29 DIAGNOSIS — Z882 Allergy status to sulfonamides status: Secondary | ICD-10-CM

## 2021-09-29 DIAGNOSIS — Z91048 Other nonmedicinal substance allergy status: Secondary | ICD-10-CM | POA: Diagnosis not present

## 2021-09-29 DIAGNOSIS — Z8261 Family history of arthritis: Secondary | ICD-10-CM | POA: Diagnosis not present

## 2021-09-29 DIAGNOSIS — Z9841 Cataract extraction status, right eye: Secondary | ICD-10-CM

## 2021-09-29 DIAGNOSIS — Z79899 Other long term (current) drug therapy: Secondary | ICD-10-CM

## 2021-09-29 DIAGNOSIS — Z131 Encounter for screening for diabetes mellitus: Secondary | ICD-10-CM | POA: Diagnosis not present

## 2021-09-29 DIAGNOSIS — M25562 Pain in left knee: Secondary | ICD-10-CM | POA: Diagnosis not present

## 2021-09-29 DIAGNOSIS — Z5111 Encounter for antineoplastic chemotherapy: Principal | ICD-10-CM

## 2021-09-29 DIAGNOSIS — E538 Deficiency of other specified B group vitamins: Secondary | ICD-10-CM | POA: Diagnosis not present

## 2021-09-29 DIAGNOSIS — E559 Vitamin D deficiency, unspecified: Secondary | ICD-10-CM | POA: Diagnosis not present

## 2021-09-29 DIAGNOSIS — C8589 Other specified types of non-Hodgkin lymphoma, extranodal and solid organ sites: Secondary | ICD-10-CM | POA: Diagnosis not present

## 2021-09-29 DIAGNOSIS — Z888 Allergy status to other drugs, medicaments and biological substances status: Secondary | ICD-10-CM | POA: Diagnosis not present

## 2021-09-29 DIAGNOSIS — Z8262 Family history of osteoporosis: Secondary | ICD-10-CM | POA: Diagnosis not present

## 2021-09-29 DIAGNOSIS — I10 Essential (primary) hypertension: Secondary | ICD-10-CM | POA: Diagnosis not present

## 2021-09-29 DIAGNOSIS — Z8616 Personal history of COVID-19: Secondary | ICD-10-CM

## 2021-09-29 DIAGNOSIS — N182 Chronic kidney disease, stage 2 (mild): Secondary | ICD-10-CM | POA: Diagnosis not present

## 2021-09-29 DIAGNOSIS — Z0001 Encounter for general adult medical examination with abnormal findings: Secondary | ICD-10-CM | POA: Diagnosis not present

## 2021-09-29 DIAGNOSIS — E785 Hyperlipidemia, unspecified: Secondary | ICD-10-CM | POA: Diagnosis not present

## 2021-09-29 DIAGNOSIS — E039 Hypothyroidism, unspecified: Secondary | ICD-10-CM | POA: Diagnosis not present

## 2021-09-29 DIAGNOSIS — Z87891 Personal history of nicotine dependence: Secondary | ICD-10-CM | POA: Diagnosis not present

## 2021-09-29 DIAGNOSIS — M545 Low back pain, unspecified: Secondary | ICD-10-CM

## 2021-09-29 DIAGNOSIS — Z8249 Family history of ischemic heart disease and other diseases of the circulatory system: Secondary | ICD-10-CM | POA: Diagnosis not present

## 2021-09-29 LAB — HEPATITIS PANEL, ACUTE
HCV Ab: NONREACTIVE
Hep A IgM: NONREACTIVE
Hep B C IgM: NONREACTIVE
Hepatitis B Surface Ag: NONREACTIVE

## 2021-09-29 LAB — COMPREHENSIVE METABOLIC PANEL
ALT: 28 U/L (ref 0–44)
AST: 32 U/L (ref 15–41)
Albumin: 3.6 g/dL (ref 3.5–5.0)
Alkaline Phosphatase: 39 U/L (ref 38–126)
Anion gap: 9 (ref 5–15)
BUN: 25 mg/dL — ABNORMAL HIGH (ref 8–23)
CO2: 27 mmol/L (ref 22–32)
Calcium: 9.3 mg/dL (ref 8.9–10.3)
Chloride: 103 mmol/L (ref 98–111)
Creatinine, Ser: 0.63 mg/dL (ref 0.44–1.00)
GFR, Estimated: >60 mL/min (ref >60)
Glucose, Bld: 106 mg/dL — ABNORMAL HIGH (ref 70–99)
Potassium: 3.4 mmol/L — ABNORMAL LOW (ref 3.5–5.1)
Sodium: 139 mmol/L (ref 135–145)
Total Bilirubin: 0.7 mg/dL (ref 0.3–1.2)
Total Protein: 6.7 g/dL (ref 6.5–8.1)

## 2021-09-29 LAB — URINALYSIS, ROUTINE W REFLEX MICROSCOPIC
Bilirubin Urine: NEGATIVE
Glucose, UA: NEGATIVE mg/dL
Hgb urine dipstick: NEGATIVE
Ketones, ur: NEGATIVE mg/dL
Leukocytes,Ua: NEGATIVE
Nitrite: NEGATIVE
Protein, ur: NEGATIVE mg/dL
Specific Gravity, Urine: 1.013 (ref 1.005–1.030)
pH: 6 (ref 5.0–8.0)

## 2021-09-29 LAB — CBC WITH DIFFERENTIAL/PLATELET
Abs Immature Granulocytes: 0.05 10*3/uL (ref 0.00–0.07)
Basophils Absolute: 0 10*3/uL (ref 0.0–0.1)
Basophils Relative: 0 %
Eosinophils Absolute: 0 10*3/uL (ref 0.0–0.5)
Eosinophils Relative: 0 %
HCT: 35.3 % — ABNORMAL LOW (ref 36.0–46.0)
Hemoglobin: 11.9 g/dL — ABNORMAL LOW (ref 12.0–15.0)
Immature Granulocytes: 1 %
Lymphocytes Relative: 15 %
Lymphs Abs: 1.1 10*3/uL (ref 0.7–4.0)
MCH: 32 pg (ref 26.0–34.0)
MCHC: 33.7 g/dL (ref 30.0–36.0)
MCV: 94.9 fL (ref 80.0–100.0)
Monocytes Absolute: 0.4 10*3/uL (ref 0.1–1.0)
Monocytes Relative: 6 %
Neutro Abs: 5.6 10*3/uL (ref 1.7–7.7)
Neutrophils Relative %: 78 %
Platelets: 181 10*3/uL (ref 150–400)
RBC: 3.72 MIL/uL — ABNORMAL LOW (ref 3.87–5.11)
RDW: 14.4 % (ref 11.5–15.5)
WBC: 7.1 10*3/uL (ref 4.0–10.5)
nRBC: 0 % (ref 0.0–0.2)

## 2021-09-29 MED ORDER — ONDANSETRON HCL 4 MG PO TABS: 4.0000 mg | ORAL_TABLET | Freq: Three times a day (TID) | ORAL | Status: DC | PRN

## 2021-09-29 MED ORDER — GUAIFENESIN-DM 100-10 MG/5ML PO SYRP: 10.0000 mL | ORAL_SOLUTION | ORAL | Status: DC | PRN

## 2021-09-29 MED ORDER — ALUM & MAG HYDROXIDE-SIMETH 200-200-20 MG/5ML PO SUSP
60.0000 mL | ORAL | Status: DC | PRN
  Administered 2021-10-03 (×2): 60 mL via ORAL
  Filled 2021-09-29 (×2): qty 60

## 2021-09-29 MED ORDER — ACETAMINOPHEN 325 MG PO TABS: 650.0000 mg | ORAL_TABLET | ORAL | Status: DC | PRN

## 2021-09-29 MED ORDER — SENNOSIDES-DOCUSATE SODIUM 8.6-50 MG PO TABS: 1.0000 | ORAL_TABLET | Freq: Every evening | ORAL | Status: DC | PRN

## 2021-09-29 MED ORDER — SODIUM CHLORIDE 0.9 % IV SOLN
8.0000 mg | Freq: Three times a day (TID) | INTRAVENOUS | Status: DC | PRN
  Filled 2021-09-29: qty 4

## 2021-09-29 MED ORDER — ONDANSETRON HCL 4 MG/2ML IJ SOLN: 4.0000 mg | Freq: Three times a day (TID) | INTRAMUSCULAR | Status: DC | PRN

## 2021-09-29 MED ORDER — DEXTROSE 5 % IV SOLN: INTRAVENOUS | Status: DC

## 2021-09-29 MED ORDER — ENOXAPARIN SODIUM 40 MG/0.4ML IJ SOSY
40.0000 mg | PREFILLED_SYRINGE | Freq: Every day | INTRAMUSCULAR | Status: DC
  Administered 2021-09-29 – 2021-10-03 (×5): 40 mg via SUBCUTANEOUS
  Filled 2021-09-29 (×5): qty 0.4

## 2021-09-29 MED ORDER — HYDROCORTISONE (PERIANAL) 2.5 % EX CREA: 1.0000 | TOPICAL_CREAM | Freq: Two times a day (BID) | CUTANEOUS | Status: DC | PRN

## 2021-09-29 MED ORDER — DEXAMETHASONE 4 MG PO TABS
4.0000 mg | ORAL_TABLET | Freq: Every day | ORAL | Status: DC
  Administered 2021-09-30 – 2021-10-03 (×4): 4 mg via ORAL
  Filled 2021-09-29 (×4): qty 1

## 2021-09-29 MED ORDER — IRBESARTAN 150 MG PO TABS
150.0000 mg | ORAL_TABLET | Freq: Every day | ORAL | Status: DC
  Administered 2021-09-29 – 2021-09-30 (×2): 150 mg via ORAL
  Filled 2021-09-29 (×2): qty 1

## 2021-09-29 MED ORDER — ONDANSETRON 4 MG PO TBDP: 4.0000 mg | ORAL_TABLET | Freq: Three times a day (TID) | ORAL | Status: DC | PRN

## 2021-09-29 MED ORDER — SODIUM BICARBONATE 8.4 % IV SOLN
INTRAVENOUS | Status: DC
  Filled 2021-09-29: qty 150
  Filled 2021-09-29 (×2): qty 1000
  Filled 2021-09-29 (×2): qty 150
  Filled 2021-09-29 (×5): qty 1000
  Filled 2021-09-29: qty 150
  Filled 2021-09-29: qty 1000

## 2021-09-29 MED ORDER — ACETAMINOPHEN 325 MG PO TABS
650.0000 mg | ORAL_TABLET | Freq: Four times a day (QID) | ORAL | Status: DC | PRN
  Administered 2021-09-29 – 2021-10-03 (×5): 650 mg via ORAL
  Filled 2021-09-29 (×5): qty 2

## 2021-09-29 NOTE — Telephone Encounter (Signed)
Patient's daughter brought in a urine specimen. Only had enough for a UA. Please advise.

## 2021-09-29 NOTE — H&P (Signed)
[facility] [place], [place] [address] [phone]   Admission Note  Date of Service: 09/16/21 Patient Name: Daisy Oliver Patient MRN: [id number] Patient DOB: 1935/11/11 Provider: [name], MD  Identifying Statement:  Daisy Oliver is a 86 y.o. female with right frontal  CNS lymphoma  who presents for first cycle of high dose methotrexate, rituximab.  Referring Provider: No referring provider defined for this encounter.  Oncologic History: Oncology History  Primary CNS lymphoma ([place])  09/02/2021 Surgery   Stereotactic biopsy by Dr. Marcello Moores; path is B-cell lymphoma CD20+   09/29/2021 -  Chemotherapy   Patient is on Treatment Plan : IP NON-HODGKINS LYMPHOMA High Dose Methotrexate + Leucovorin Rescue     09/30/2021 -  Chemotherapy   Patient is on Treatment Plan : NON-HODGKINS LYMPHOMA Rituximab q21d       Biomarkers:  CD20 positive .  Ki-67 50% .   Unknown   Unknown   History of Present Illness: Daisy Oliver presented to medical attention in May 2023 with several weeks of progressive gait instability.  She describes feeling unsteady on her feet, even falling "a couple of times".  After one of the falls, ED visit led to CNS imaging which demonstrated multifocal masses in the right hemisphere.  She underwent stereotactic biopsy with Dr. Marcello Moores on 09/02/21; path demonstrated diffuse large B-cell lymphoma, CD20+.  Since surgery, she has tapered off the steroids without issue.  She feels "fairly normal" aside from pain in her knee and lower back.  She uses a cane to walk, mainly because of the knee pain.  No issues with frank weakness, no seizures.  She lives at home with her husband who has dementia.  Daughter is nearby in [place].  Today she presents for first cycle of methotrexate, following staging evaluation.  She was started on decadron 68m twice per day over the weekend because of increased  confusion reported by daughter.  This has led to improvement per patient.  Continues to have pain in left knee and leg as prior.  Medications: No current facility-administered medications on file prior to encounter.   Current Outpatient Medications on File Prior to Encounter  Medication Sig Dispense Refill   acetaminophen (TYLENOL) 650 MG CR tablet Take 1,300 mg by mouth See admin instructions. Take 2 tablets (1300 mg) by mouth daily at bedtime, may also take 2 tablets (1300 mg) during the night as needed for pain     dexamethasone (DECADRON) 2 MG tablet Take 2 mg by mouth 2 (two) times daily.     Melatonin 10 MG TABS Take 10 mg by mouth at bedtime.     olmesartan (BENICAR) 20 MG tablet Take 1/2 tablet  Daily  for BP (Patient taking differently: Take 10 mg by mouth every morning.   for BP) 45 tablet 3   Polyethyl Glycol-Propyl Glycol (SYSTANE) 0.4-0.3 % GEL ophthalmic gel Place 1 Application into both eyes at bedtime.     Polyethyl Glycol-Propyl Glycol (SYSTANE) 0.4-0.3 % SOLN Place 1 drop into both eyes every morning.     tretinoin (RETIN-A) 0.05 % cream Apply topically at bedtime. (Patient taking differently: Apply 1 application  topically every Monday, Wednesday, and Friday at 8 PM.) 45 g 0   acetaminophen (TYLENOL) 325 MG tablet Take 650 mg by mouth every 6 (six) hours as needed (for pain). (Patient not taking: Reported on 09/29/2021)     BIOTIN PO Take 1 tablet by  mouth daily.     Magnesium 500 MG CAPS Take 500 mg by mouth daily.     Misc Natural Products (OSTEO BI-FLEX JOINT SHIELD PO) Take 1 tablet by mouth at bedtime. Osteo Bi-Flex     NEOMYCIN-POLYMYXIN-HYDROCORTISONE (CORTISPORIN) 1 % SOLN OTIC solution Place 6 drops into both ears 4 (four) times daily. (Patient not taking: Reported on 09/29/2021) 10 mL 3   Taurine 1000 MG CAPS Take 1,000 mg by mouth daily.      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 Herbert Deaner   FRAMELESS  BIOPSY WITH BRAINLAB Right 09/02/2021   Procedure: FRONTAL STEREOTACTIC BRAIN BIOPSY;  Surgeon: Vallarie Mare, MD;  Location: [facility];  Service: Neurosurgery;  Laterality: Right;   IR IMAGING GUIDED PORT INSERTION  09/24/2021   RADIAL HEAD ARTHROPLASTY Right 09/16/2012   Procedure: RADIAL HEAD ARTHROPLASTY;  Surgeon: Johnny Bridge, MD;  Location: [facility];  Service: Orthopedics;  Laterality: Right;  RADIAL HEAD REPLACEMENT     Social History:  Social History   Socioeconomic History   Marital status: Married    Spouse name: Not on file   Number of children: 2   Years of education: Not on file   Highest education level: Not on file  Occupational History   Not on file  Tobacco Use   Smoking status: Former    Packs/day: 2.00    Years: 22.00    Total pack years: 44.00    Types: Cigarettes    Quit date: 09/17/1983    Years since quitting: 38.0   Smokeless tobacco: Never  Vaping Use   Vaping Use: Never used  Substance and Sexual Activity   Alcohol use: Not Currently     Alcohol/week: 0.0 - 1.0 standard drinks of alcohol    Comment: 2/month   Drug use: No   Sexual activity: Not Currently  Other Topics Concern   Not on file  Social History Narrative   Not on file   Social Determinants of Health   Financial Resource Strain: Not on file  Food Insecurity: Not on file  Transportation Needs: Not on file  Physical Activity: Sufficiently Active (09/14/2017)   Exercise Vital Sign    Days of Exercise per Week: 5 days    Minutes of Exercise per Session: 60 min  Stress: No Stress Concern Present (09/14/2017)   [place]    Feeling of  Stress : Only a little  Social Connections: Not on file  Intimate Partner Violence: Not on file   Family History:  Family History  Problem Relation Age of Onset   Heart disease Mother    Brain cancer Father 81       Brain tumor, not cancerous   Hypertension Sister    Osteoporosis Sister    Arthritis Sister    Scoliosis Sister    Hypertension Sister    Osteoporosis Sister    Cancer Paternal Grandfather    Suicidality Paternal Grandfather    Breast cancer Neg Hx     Review of Systems: Constitutional: Doesn't report fevers, chills or abnormal weight loss Eyes: Doesn't report blurriness of vision Ears, nose, mouth, throat, and face: Doesn't report sore throat Respiratory: Doesn't report cough, dyspnea or wheezes Cardiovascular: Doesn't report palpitation, chest discomfort  Gastrointestinal:  Doesn't report nausea, constipation, diarrhea GU: Doesn't report incontinence Skin: Doesn't report skin rashes Neurological: Per HPI Musculoskeletal: Doesn't report joint pain Behavioral/Psych: Doesn't report anxiety  Physical Exam: Vitals:   09/29/21 1220  BP: (!) 153/62  Pulse: (!) 46  Resp: 16  Temp: 98.2 F (36.8 C)  SpO2: 97%   KPS: 80. General: Alert, cooperative, pleasant, in no acute distress Head: Normal EENT: No conjunctival injection or  scleral icterus.  Lungs: Resp effort normal Cardiac: Regular rate Abdomen: Non-distended abdomen Skin: No rashes cyanosis or petechiae. Extremities: No clubbing or edema  Neurologic Exam: Mental Status: Awake, alert, attentive to examiner. Oriented to self and environment. Language is fluent with intact comprehension.  Cranial Nerves: Visual acuity is grossly normal. Visual fields are full. Extra-ocular movements intact. No ptosis. Face is symmetric Motor: Tone and bulk are normal. Power is full in both arms and legs. Reflexes are symmetric, no pathologic reflexes present.  Sensory: Intact to light touch Gait: Orthopedic limitation, cane assisted   Labs: I have reviewed the data as listed    Component Value Date/Time   NA 139 09/29/2021 1300   K 3.4 (L) 09/29/2021 1300   CL 103 09/29/2021 1300   CO2 27 09/29/2021 1300   GLUCOSE 106 (H) 09/29/2021 1300   BUN 25 (H) 09/29/2021 1300   CREATININE 0.63 09/29/2021 1300   CREATININE 0.69 09/23/2021 1611   CALCIUM 9.3 09/29/2021 1300   PROT 6.7 09/29/2021 1300   ALBUMIN 3.6 09/29/2021 1300   AST 32 09/29/2021 1300   ALT 28 09/29/2021 1300   ALKPHOS 39 09/29/2021 1300   BILITOT 0.7 09/29/2021 1300   GFRNONAA >60 09/29/2021 1300   GFRNONAA 66 09/19/2020 0000   GFRAA 76 09/19/2020 0000   Lab Results  Component Value Date   WBC 7.1 09/29/2021   NEUTROABS 5.6 09/29/2021   HGB 11.9 (L) 09/29/2021   HCT 35.3 (L) 09/29/2021   MCV 94.9 09/29/2021   PLT 181 09/29/2021    Imaging: MR TOTAL SPINE METS SCREENING  Result Date: 09/26/2021 CLINICAL DATA:  Lymphoma staging EXAM: MRI TOTAL SPINE WITHOUT AND WITH CONTRAST TECHNIQUE: Multisequence MR imaging of the spine from the cervical spine to the sacrum was performed prior to and following IV contrast administration for evaluation of spinal metastatic disease. CONTRAST:  41m GADAVIST GADOBUTROL 1 MMOL/ML IV SOLN COMPARISON:  Head CT from 3 days ago FINDINGS: MRI CERVICAL SPINE FINDINGS  Alignment: Straightening of cervical lordosis. Mild C3-4 anterolisthesis Vertebrae: No evidence of metastatic disease. Cord: No evidence of intrathecal metastasis. Accounting for volume averaging, normal cord signal. Posterior Fossa, vertebral arteries, paraspinal tissues: Brain MRI  reported separately. No perispinal mass or inflammation seen. Disc levels: Study is not optimized for degenerative assessment; briefly: C2-3: Disc narrowing and bulging with mild endplate ridging. C3-4: Degenerative facet spurring asymmetric to the left. Asymmetric left uncovertebral ridging and probable left foraminal impingement C4-5: Incomplete segmentation C5-6: Disc narrowing and bulging with circumferential endplate and uncovertebral spurring. No neural impingement C6-7: Disc narrowing and bulging with endplate and uncovertebral ridging. Negative facets. No neural impingement C7-T1:Mild facet spurring. MRI THORACIC SPINE FINDINGS Alignment:  Unremarkable Vertebrae: No evidence of metastatic disease. Cord: Normal signal and morphology when allowing for volume averaging on STIR imaging. No evidence of intrathecal metastasis. Paraspinal and other soft tissues: Negative for perispinal mass or inflammation. Disc levels: Generalized disc desiccation and narrowing. Small disc herniations are seen at T5-6 to T8-9. Negative facets. MRI LUMBAR SPINE FINDINGS Segmentation: 5 lumbar type vertebrae. There is incomplete segmentation at L5-S1. Alignment:  Physiologic. Vertebrae:  No evidence of bone lesion or incidental fracture Conus medullaris: Extends to the L1-2 level and appears normal. On axial postcontrast imaging there is smooth accentuated enhancement of lower nerve roots seen at L3-4 and below. Paraspinal and other soft tissues: Negative for perispinal mass or adenopathy. Disc levels: T12- L1: Mild disc bulging L1-L2: Mild disc bulging L2-L3: Mild disc space narrowing and bulging.  Mild facet spurring L3-L4: Central herniation combines  with ligamentum flavum thickening and facet hypertrophy to cause advanced spinal stenosis. Patent foramina L4-L5: Disc narrowing and bulging. Degenerative facet spurring asymmetric to the left. Patent canal and foramina. L5-S1:Incomplete segmentation with no degeneration or impingement IMPRESSION: 1. Accentuated nerve root enhancement at the lower lumbar spine, smoothly contoured and only seen below severe spinal stenosis at L3-4, likely reactive/compressive enhancement. If drop metastases are a clinical possibility, lumbar follow-up or CSF cytology could be considered. 2. No osseous metastatic disease. Electronically Signed   By: Jorje Guild M.D.   On: 09/26/2021 07:24   IR IMAGING GUIDED PORT INSERTION  Result Date: 09/24/2021 INDICATION: 86 year old female with history of CNS lymphoma requiring central venous access for chemotherapy. EXAM: IMPLANTED PORT A CATH PLACEMENT WITH ULTRASOUND AND FLUOROSCOPIC GUIDANCE COMPARISON:  None Available. MEDICATIONS: None ANESTHESIA/SEDATION: Moderate (conscious) sedation was employed during this procedure. A total of Versed 1 mg and Fentanyl 50 mcg was administered intravenously. Moderate Sedation Time: 16 minutes. The patient's level of consciousness and vital signs were monitored continuously by radiology nursing throughout the procedure under my direct supervision. CONTRAST:  None FLUOROSCOPY TIME:  0 minutes (0 mGy) COMPLICATIONS: None immediate. PROCEDURE: The procedure, risks, benefits, and alternatives were explained to the patient. Questions regarding the procedure were encouraged and answered. The patient understands and consents to the procedure. The right neck and chest were prepped with chlorhexidine in a sterile fashion, and a sterile drape was applied covering the operative field. Maximum barrier sterile technique with sterile gowns and gloves were used for the procedure. A timeout was performed prior to the initiation of the procedure. Ultrasound was  used to examine the jugular vein which was compressible and free of internal echoes. A skin marker was used to demarcate the planned venotomy and port pocket incision sites. Local anesthesia was provided to these sites and the subcutaneous tunnel track with 1% lidocaine with 1:100,000 epinephrine. A small incision was created at the jugular access site and blunt dissection was performed of the subcutaneous tissues. Under ultrasound guidance, the jugular vein was accessed with a 21 ga micropuncture needle and an 0.018" wire was inserted to  the superior vena cava. Real-time ultrasound guidance was utilized for vascular access including the acquisition of a permanent ultrasound image documenting patency of the accessed vessel. A 5 Fr micopuncture set was then used, through which a 0.035" Rosen wire was passed under fluoroscopic guidance into the inferior vena cava. An 8 Fr dilator was then placed over the wire. A subcutaneous port pocket was then created along the upper chest wall utilizing a combination of sharp and blunt dissection. The pocket was irrigated with sterile saline, packed with gauze, and observed for hemorrhage. A single lumen slim sized power injectable port was chosen for placement. The 8 Fr catheter was tunneled from the port pocket site to the venotomy incision. The port was placed in the pocket. The external catheter was trimmed to appropriate length. The dilator was exchanged for an 8 Fr peel-away sheath under fluoroscopic guidance. The catheter was then placed through the sheath and the sheath was removed. Final catheter positioning was confirmed and documented with a fluoroscopic spot radiograph. The port was accessed with a Huber needle, aspirated, and flushed with heparinized saline. The deep dermal layer of the port pocket incision was closed with interrupted 3-0 Vicryl suture. The skin was opposed with a running subcuticular 4-0 Monocryl suture. Dermabond was then placed over the port pocket  and neck incisions. The patient tolerated the procedure well without immediate post procedural complication. FINDINGS: After catheter placement, the tip lies within the superior cavoatrial junction. The catheter aspirates and flushes normally and is ready for immediate use. IMPRESSION: Successful placement of a power injectable Port-A-Cath via the right internal jugular vein. The catheter is ready for immediate use. Ruthann Cancer, MD [facility] Electronically Signed   By: Ruthann Cancer M.D.   On: 09/24/2021 16:50   NM PET Image Initial (PI) Whole Body  Result Date: 09/24/2021 CLINICAL DATA:  Initial treatment strategy for lymphoma. EXAM: NUCLEAR MEDICINE PET WHOLE BODY TECHNIQUE: 7.8 mCi F-18 FDG was injected intravenously. Full-ring PET imaging was performed from the head to foot after the radiotracer. CT data was obtained and used for attenuation correction and anatomic localization. Fasting blood glucose: 92 mg/dl COMPARISON:  CT chest, abdomen and pelvis dated Aug 28, 2021. FINDINGS: Mediastinal blood pool activity: SUV max 2.2 HEAD/NECK: Focal hypermetabolic activity seen in the right frontal lobe and right basal ganglia. No FDG avid cervical lymph nodes. Incidental CT findings: none CHEST: Hypermetabolic left lower lobe pulmonary nodule measuring 1.4 x 1.4 cm on series 7, image 49 with an SUV max of 5.6, finding is new when compared with Aug 28, 2021 exam. No hypermetabolic lymph nodes seen in the chest. Incidental CT findings: Mild atherosclerotic disease of the thoracic aorta. ABDOMEN/PELVIS: No abnormal hypermetabolic activity within the liver, pancreas, adrenal glands, or spleen. Mildly hypermetabolic subcentimeter right inguinal lymph node measuring mm in short axis on series 4, image 200 with SUV max of 3.6, unchanged in size when compared to prior exam. Incidental CT findings: Splenic calcifications, likely sequela of prior granulomatous  infection. Diverticulosis. Atherosclerotic disease of the abdominal aorta. SKELETON: No focal hypermetabolic activity to suggest skeletal metastasis. Incidental CT findings: none EXTREMITIES: No abnormal hypermetabolic activity in the lower extremities. Incidental CT findings: none IMPRESSION: 1. Focal hypermetabolic activity seen in the right frontal lobe and right basal ganglia, compatible with known CNS lesions. 2. Hypermetabolic left lower lobe pulmonary nodule which is new when compared with Aug 28, 2021 prior and potentially infectious. Recommend short-term follow-up CT in 3  months for further evaluation. 3. Mildly hypermetabolic subcentimeter right inguinal lymph node, potentially reactive. Recommend attention on follow-up. Electronically Signed   By: Yetta Glassman M.D.   On: 09/24/2021 08:40   CT HEAD WO CONTRAST  Result Date: 09/03/2021 CLINICAL DATA:  Brain/CNS neoplasm, assess treatment response EXAM: CT HEAD WITHOUT CONTRAST TECHNIQUE: Contiguous axial images were obtained from the base of the skull through the vertex without intravenous contrast. RADIATION DOSE REDUCTION: This exam was performed according to the departmental dose-optimization program which includes automated exposure control, adjustment of the mA and/or kV according to patient size and/or use of iterative reconstruction technique. COMPARISON:  CT head May 17, 23. MRI head Aug 28, 2021 and Aug 27, 2021. FINDINGS: Brain: Postsurgical changes of right frontal burr hole and biopsy with small volume of intraparenchymal and extra-axial hemorrhage and gas in this region. Redemonstrated multiple masses, largest in the right basal ganglia and better characterized on prior MRI. Similar extensive surrounding vasogenic edema. Mass effect is similar versus slightly increased with 9 mm of leftward midline shift. No evidence of acute large vascular territory infarct or hydrocephalus. Vascular: No hyperdense vessel identified. Skull: Right  frontal burr hole. Little bit of overlying gas/edema in the scalp. No acute fracture. Sinuses/Orbits: Clear sinuses. Other: No mastoid effusions. IMPRESSION: 1. Postsurgical changes of right frontal biopsy with small volume of intraparenchymal and extra-axial hemorrhage and gas in this region. 2. Redemonstrated multiple masses, largest in the right basal ganglia and better characterized on recent MRI. Similar extensive surrounding vasogenic edema. Mass effect is similar versus slightly increased with 9 mm of leftward midline shift. Electronically Signed   By: Margaretha Sheffield M.D.   On: 09/03/2021 09:38     Pathology: SURGICAL PATHOLOGY  CASE: [id number]  PATIENT: [facility]  Surgical Pathology Report   Clinical History: brain tumor (cm)   FINAL MICROSCOPIC DIAGNOSIS:   A. BRAIN TUMOR, RIGHT FRONTAL LOBE, BIOPSY:  -  Diffuse large B-cell lymphoma.  B. BRAIN TUMOR, RIGHT FRONTAL LOBE, BIOPSY:  -  Diffuse large B-cell lymphoma.   Note: The cells of interest consist of large cells with prominent small  nuclear membrane bound nucleoli and apparent scant cytoplasm.  Immunohistochemical stains with appropriate controls were performed on  both parts A and B given the morphologic differences (likely frozen  section artifact).  The cells of interest are positive for CD20 and PAX5  consistent with B-cell origin.  GFAP highlights background brain  parenchyma.  These same cells coexpress MUM1 (greater than 50%) BCL6 and  CD5.  CD10 is essentially negative.  Bcl-2 is equivocal given high  background but interpreted as likely positive.  CD3 highlights  background small T cells.  The proliferation rate by Ki-67 is  approximately 50%.  CD30 is negative.  EBER ISH is negative.  P53 shows  scattered positive cells of uncertain clinical significance.  Overall  the findings are consistent with a diffuse large B-cell lymphoma; if a  systemic work-up is negative for lymphoma, the findings would be   consistent with a primary CNS lymphoma.  The immunophenotype is most  consistent with a post germinal center/activated immunophenotype.  There  is sufficient material present for further FISH/molecular work-up if  clinically indicated (please call office for add-on testing if  necessary).   Dr. Gari Crown has peer reviewed the case and agrees with the interpretation.   INTRAOPERATIVE DIAGNOSIS:   A. Brain Tumor, Right Frontal Lobe, Biopsy: "High grade astrocytoma."  Intraoperative diagnosis rendered by Dr. Thornell Sartorius at 14:55  on 02 Sep 2021.    Assessment/Plan Primary CNS Lymphoma  -Once urine alkalinized and labs reviewed, appropriate to proceed with C1 of high dose MTX with leucovorin rescue inpatient -MTX-R therapy C1D1 hopefully tomorrow 09/30/21: Methotrexate 3.5g/m2 given over 4 hours once urine pH >= 7.0 -Leucovorin 31m q6 hours 24 hours after MTX infusion   -Rituxan can be given 24-48 hours after MTX infusion -To start once electrolytes stabilized: IV fluids, dextrose with 1563m sodium bicarbonate at 12571mr.  Need to adjust rate to urine pH >= 7 -daily labs with cbc, cmp -daily urine pH once it reaches target pH of >7 -daily MTX levels following MTX infusion until MTX levels <0.10 -continue same supportive medications with exception of protonix which interferes with clearance of MTX  -decrease decadron to 4mg106mily -LFTs and renal function both normal at this time   May continue home meds as prior, we will hold vitamins and supplements during the admission.  The total time spent in the encounter was 60 minutes and more than 50% was on counseling and review of test results  Please call/text/page with any clinical concerns, updates.  ZachVentura Sellers Medical Director of Neuro-Oncology [facility] CenterWeslCoamo06/23 3:15 PM

## 2021-09-30 DIAGNOSIS — C8589 Other specified types of non-Hodgkin lymphoma, extranodal and solid organ sites: Secondary | ICD-10-CM | POA: Diagnosis not present

## 2021-09-30 LAB — URINALYSIS, ROUTINE W REFLEX MICROSCOPIC
Bilirubin Urine: NEGATIVE
Bilirubin Urine: NEGATIVE
Glucose, UA: NEGATIVE
Glucose, UA: NEGATIVE mg/dL
Hgb urine dipstick: NEGATIVE
Hgb urine dipstick: NEGATIVE
Hyaline Cast: NONE SEEN /LPF
Ketones, ur: NEGATIVE
Ketones, ur: NEGATIVE mg/dL
Leukocytes,Ua: NEGATIVE
Nitrite: POSITIVE — AB
Nitrite: NEGATIVE
Protein, ur: NEGATIVE mg/dL
RBC / HPF: NONE SEEN /HPF (ref 0–2)
Specific Gravity, Urine: 1.022 (ref 1.001–1.035)
Specific Gravity, Urine: 1.005 (ref 1.005–1.030)
pH: >=8.5 — AB (ref 5.0–8.0)
pH: 9 — ABNORMAL HIGH (ref 5.0–8.0)

## 2021-09-30 LAB — COMPREHENSIVE METABOLIC PANEL
ALT: 24 U/L (ref 0–44)
AST: 27 U/L (ref 15–41)
Albumin: 3 g/dL — ABNORMAL LOW (ref 3.5–5.0)
Alkaline Phosphatase: 33 U/L — ABNORMAL LOW (ref 38–126)
Anion gap: 8 (ref 5–15)
BUN: 17 mg/dL (ref 8–23)
CO2: 34 mmol/L — ABNORMAL HIGH (ref 22–32)
Calcium: 8.7 mg/dL — ABNORMAL LOW (ref 8.9–10.3)
Chloride: 101 mmol/L (ref 98–111)
Creatinine, Ser: 0.63 mg/dL (ref 0.44–1.00)
GFR, Estimated: >60 mL/min (ref >60)
Glucose, Bld: 91 mg/dL (ref 70–99)
Potassium: 3 mmol/L — ABNORMAL LOW (ref 3.5–5.1)
Sodium: 143 mmol/L (ref 135–145)
Total Bilirubin: 1 mg/dL (ref 0.3–1.2)
Total Protein: 5.9 g/dL — ABNORMAL LOW (ref 6.5–8.1)

## 2021-09-30 LAB — CBC WITH DIFFERENTIAL/PLATELET
Abs Immature Granulocytes: 0.03 10*3/uL (ref 0.00–0.07)
Basophils Absolute: 0 10*3/uL (ref 0.0–0.1)
Basophils Relative: 1 %
Eosinophils Absolute: 0.1 10*3/uL (ref 0.0–0.5)
Eosinophils Relative: 2 %
HCT: 33.7 % — ABNORMAL LOW (ref 36.0–46.0)
Hemoglobin: 11.5 g/dL — ABNORMAL LOW (ref 12.0–15.0)
Immature Granulocytes: 1 %
Lymphocytes Relative: 46 %
Lymphs Abs: 3.1 10*3/uL (ref 0.7–4.0)
MCH: 32.3 pg (ref 26.0–34.0)
MCHC: 34.1 g/dL (ref 30.0–36.0)
MCV: 94.7 fL (ref 80.0–100.0)
Monocytes Absolute: 0.6 10*3/uL (ref 0.1–1.0)
Monocytes Relative: 9 %
Neutro Abs: 2.6 10*3/uL (ref 1.7–7.7)
Neutrophils Relative %: 41 %
Platelets: 165 10*3/uL (ref 150–400)
RBC: 3.56 MIL/uL — ABNORMAL LOW (ref 3.87–5.11)
RDW: 14.2 % (ref 11.5–15.5)
WBC: 6.5 10*3/uL (ref 4.0–10.5)
nRBC: 0 % (ref 0.0–0.2)

## 2021-09-30 LAB — HEPATITIS B SURFACE ANTIGEN: Hepatitis B Surface Ag: NONREACTIVE

## 2021-09-30 LAB — HEPATITIS B CORE ANTIBODY, TOTAL: Hep B Core Total Ab: NONREACTIVE

## 2021-09-30 MED ORDER — METHOTREXATE SODIUM (PF) CHEMO INJECTION 1 GM/40ML
3.5000 g/m2 | Freq: Once | INTRAVENOUS | Status: AC
  Administered 2021-09-30: 6.265 g via INTRAVENOUS
  Filled 2021-09-30: qty 250.6

## 2021-09-30 MED ORDER — LIOTHYRONINE SODIUM 5 MCG PO TABS
10.0000 ug | ORAL_TABLET | Freq: Two times a day (BID) | ORAL | Status: DC
  Administered 2021-09-30: 10 ug via ORAL
  Filled 2021-09-30: qty 2

## 2021-09-30 MED ORDER — SODIUM CHLORIDE 0.9% FLUSH
10.0000 mL | Freq: Two times a day (BID) | INTRAVENOUS | Status: DC
  Administered 2021-09-30 – 2021-10-03 (×4): 10 mL

## 2021-09-30 MED ORDER — CHLORHEXIDINE GLUCONATE CLOTH 2 % EX PADS
6.0000 | MEDICATED_PAD | Freq: Every day | CUTANEOUS | Status: DC
Start: 2021-09-30 — End: 2021-10-03
  Administered 2021-09-30 – 2021-10-03 (×4): 6 via TOPICAL

## 2021-09-30 MED ORDER — IRBESARTAN 75 MG PO TABS
75.0000 mg | ORAL_TABLET | Freq: Every day | ORAL | Status: DC
  Administered 2021-10-01 – 2021-10-03 (×3): 75 mg via ORAL
  Filled 2021-09-30 (×3): qty 1

## 2021-09-30 MED ORDER — SODIUM CHLORIDE 0.9% FLUSH: 10.0000 mL | INTRAVENOUS | Status: DC | PRN

## 2021-09-30 MED ORDER — POTASSIUM CHLORIDE 10 MEQ/100ML IV SOLN
10.0000 meq | INTRAVENOUS | Status: AC
  Administered 2021-09-30 (×4): 10 meq via INTRAVENOUS
  Filled 2021-09-30 (×2): qty 100

## 2021-09-30 MED ORDER — SODIUM CHLORIDE 0.9 % IV SOLN: INTRAVENOUS | Status: DC

## 2021-09-30 MED ORDER — SODIUM CHLORIDE 0.9 % IV SOLN
Freq: Once | INTRAVENOUS | Status: AC
  Administered 2021-09-30: 16 mg via INTRAVENOUS
  Filled 2021-09-30: qty 8

## 2021-09-30 MED ORDER — LIOTHYRONINE SODIUM 5 MCG PO TABS
5.0000 ug | ORAL_TABLET | Freq: Two times a day (BID) | ORAL | Status: DC
  Administered 2021-10-01 – 2021-10-03 (×6): 5 ug via ORAL
  Filled 2021-09-30 (×6): qty 1

## 2021-09-30 NOTE — Progress Notes (Signed)
[facility] [place], [place] [address] [phone]   Progress Note  Date of Service: 09/16/21 Patient Name: Daisy Oliver Patient MRN: [id number] Patient DOB: September 19, 1935 Provider: [name], MD  Identifying Statement:  Daisy Oliver is a 86 y.o. female with right frontal  CNS lymphoma  who presents for first cycle of high dose methotrexate, rituximab.  Referring Provider: [name], MD [place],  [place] [address]  Oncologic History: Oncology History  Primary CNS lymphoma [facility])  09/02/2021 Surgery   Stereotactic biopsy by Dr. Marcello Moores; path is B-cell lymphoma CD20+   09/30/2021 -  Chemotherapy   Patient is on Treatment Plan : IP NON-HODGKINS LYMPHOMA High Dose Methotrexate + Leucovorin Rescue     09/30/2021 -  Chemotherapy   Patient is on Treatment Plan : NON-HODGKINS LYMPHOMA Rituximab q21d       Biomarkers:  CD20 positive .  Ki-67 50% .   Unknown   Unknown   Interval History: Daisy Oliver has no complaints today.  Getting methotrexate, tolerating it well.  Didn't have the best sleep but is in better spirits.  Medications: No current facility-administered medications on file prior to encounter.   Current Outpatient Medications on File Prior to Encounter  Medication Sig Dispense Refill   acetaminophen (TYLENOL) 650 MG CR tablet Take 1,300 mg by mouth See admin instructions. Take 2 tablets (1300 mg) by mouth daily at bedtime, may also take 2 tablets (1300 mg) during the night as needed for pain     Ascorbic Acid (VITAMIN C PO) Take 1 tablet by mouth 2 (two) times daily.     BIOTIN PO Take 1 tablet by mouth 2 (two) times daily.     dexamethasone (DECADRON) 2 MG tablet Take 2 mg by mouth 2 (two) times daily.     Emollient (CERAVE HEALING) OINT Apply 1 Application topically 2 (two) times daily as needed (wound care/dry skin).     MAGNESIUM PO Take 1 capsule by mouth 2  (two) times daily.     Melatonin 10 MG TABS Take 10 mg by mouth at bedtime.     Menthol, Topical Analgesic, (BIOFREEZE EX) Apply 1 Application topically 2 (two) times daily as needed (leg pain).     Misc Natural Products (OSTEO BI-FLEX JOINT SHIELD PO) Take 1 tablet by mouth 2 (two) times daily.     Multiple Vitamin (MULTIVITAMIN WITH MINERALS) TABS tablet Take 1 tablet by mouth 2 (two) times daily.     Multiple Vitamins-Minerals (ZINC PO) Take 1 tablet by mouth 2 (two) times daily.     olmesartan (BENICAR) 20 MG tablet Take 1/2 tablet  Daily  for BP (Patient taking differently: Take 10 mg by mouth every morning.   for BP) 45 tablet 3   Polyethyl Glycol-Propyl Glycol (SYSTANE) 0.4-0.3 % GEL ophthalmic gel Place 1 Application into both eyes at bedtime.     Polyethyl Glycol-Propyl Glycol (SYSTANE) 0.4-0.3 % SOLN Place 1 drop into both eyes every morning.     Pramox-PE-Glycerin-Petrolatum (PREPARATION H) 1-0.25-14.4-15 % CREA Apply 1 Application topically 2 (two) times daily as needed (hemorrhoid pain).     TAURINE PO Take 1 capsule by mouth 2 (two) times daily.     tretinoin (RETIN-A) 0.05 % cream Apply topically at bedtime. (Patient taking differently: Apply 1 application  topically every Monday, Wednesday, and Friday at 8 PM.) 45 g 0   Zinc Oxide 40 % PSTE  Apply 1 Application topically 2 (two) times daily as needed (wound care/barrier cream).     acetaminophen (TYLENOL) 325 MG tablet Take 650 mg by mouth every 6 (six) hours as needed (for pain). 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 Herbert Deaner   FRAMELESS  BIOPSY WITH BRAINLAB Right 09/02/2021   Procedure: FRONTAL STEREOTACTIC BRAIN BIOPSY;  Surgeon: Vallarie Mare, MD;  Location: [place];  Service: Neurosurgery;  Laterality: Right;   IR IMAGING GUIDED PORT INSERTION  09/24/2021   RADIAL HEAD ARTHROPLASTY Right 09/16/2012   Procedure: RADIAL HEAD ARTHROPLASTY;  Surgeon: Johnny Bridge, MD;  Location: [place];  Service: Orthopedics;  Laterality: Right;  RADIAL HEAD REPLACEMENT     Social History:  Social History   Socioeconomic History   Marital status: Married    Spouse name: Not on file   Number of children: 2   Years of education: Not on file   Highest education level: Not on file  Occupational History   Not on file  Tobacco Use   Smoking status: Former    Packs/day: 2.00    Years: 22.00    Total pack years: 44.00    Types: Cigarettes    Quit date: 09/17/1983    Years since quitting: 38.0   Smokeless tobacco: Never  Vaping Use   Vaping Use: Never used  Substance and Sexual Activity   Alcohol use: Not Currently    Alcohol/week: 0.0 - 1.0 standard drinks of alcohol    Comment: 2/month   Drug use: No   Sexual activity: Not Currently  Other Topics Concern   Not on file  Social History Narrative   Not on file   Social Determinants of Health   Financial Resource Strain: Not on file  Food  Insecurity: Not on file  Transportation Needs: Not on file  Physical Activity: Sufficiently Active (09/14/2017)   Exercise Vital Sign    Days of Exercise per Week: 5 days    Minutes of Exercise per Session: 60 min  Stress: No Stress Concern Present (09/14/2017)   [place]    Feeling of Stress : Only a little  Social Connections: Not on file  Intimate Partner Violence: Not on file   Family History:  Family History  Problem Relation Age of Onset   Heart disease Mother  Brain cancer Father 32       Brain tumor, not cancerous   Hypertension Sister    Osteoporosis Sister    Arthritis Sister    Scoliosis Sister    Hypertension Sister    Osteoporosis Sister    Cancer Paternal Grandfather    Suicidality Paternal Grandfather    Breast cancer Neg Hx     Review of Systems: Constitutional: Doesn't report fevers, chills or abnormal weight loss Eyes: Doesn't report blurriness of vision Ears, nose, mouth, throat, and face: Doesn't report sore throat Respiratory: Doesn't report cough, dyspnea or wheezes Cardiovascular: Doesn't report palpitation, chest discomfort  Gastrointestinal:  Doesn't report nausea, constipation, diarrhea GU: Doesn't report incontinence Skin: Doesn't report skin rashes Neurological: Per HPI Musculoskeletal: Doesn't report joint pain Behavioral/Psych: Doesn't report anxiety  Physical Exam: Vitals:   09/30/21 0550 09/30/21 1557  BP: (!) 125/40 (!) 141/67  Pulse: (!) 49 (!) 49  Resp: 16 16  Temp: 98.1 F (36.7 C) (!) 97.4 F (36.3 C)  SpO2: 97% 94%   KPS: 80. General: Alert, cooperative, pleasant, in no acute distress Head: Normal EENT: No conjunctival injection or scleral icterus.  Lungs: Resp effort normal Cardiac: Regular rate Abdomen: Non-distended abdomen Skin: No rashes cyanosis or petechiae. Extremities: No clubbing or edema  Neurologic Exam: Mental Status: Awake, alert,  attentive to examiner. Oriented to self and environment. Language is fluent with intact comprehension.  Cranial Nerves: Visual acuity is grossly normal. Visual fields are full. Extra-ocular movements intact. No ptosis. Face is symmetric Motor: Tone and bulk are normal. Power is full in both arms and legs. Reflexes are symmetric, no pathologic reflexes present.  Sensory: Intact to light touch Gait: Orthopedic limitation, cane assisted   Labs: I have reviewed the data as listed    Component Value Date/Time   NA 143 09/30/2021 0613   K 3.0 (L) 09/30/2021 0613   CL 101 09/30/2021 0613   CO2 34 (H) 09/30/2021 0613   GLUCOSE 91 09/30/2021 0613   BUN 17 09/30/2021 0613   CREATININE 0.63 09/30/2021 0613   CREATININE 0.69 09/23/2021 1611   CALCIUM 8.7 (L) 09/30/2021 0613   PROT 5.9 (L) 09/30/2021 0613   ALBUMIN 3.0 (L) 09/30/2021 0613   AST 27 09/30/2021 0613   ALT 24 09/30/2021 0613   ALKPHOS 33 (L) 09/30/2021 0613   BILITOT 1.0 09/30/2021 0613   GFRNONAA >60 09/30/2021 0613   GFRNONAA 66 09/19/2020 0000   GFRAA 76 09/19/2020 0000   Lab Results  Component Value Date   WBC 6.5 09/30/2021   NEUTROABS 2.6 09/30/2021   HGB 11.5 (L) 09/30/2021   HCT 33.7 (L) 09/30/2021   MCV 94.7 09/30/2021   PLT 165 09/30/2021    Imaging: MR TOTAL SPINE METS SCREENING  Result Date: 09/26/2021 CLINICAL DATA:  Lymphoma staging EXAM: MRI TOTAL SPINE WITHOUT AND WITH CONTRAST TECHNIQUE: Multisequence MR imaging of the spine from the cervical spine to the sacrum was performed prior to and following IV contrast administration for evaluation of spinal metastatic disease. CONTRAST:  67m GADAVIST GADOBUTROL 1 MMOL/ML IV SOLN COMPARISON:  Head CT from 3 days ago FINDINGS: MRI CERVICAL SPINE FINDINGS Alignment: Straightening of cervical lordosis. Mild C3-4 anterolisthesis Vertebrae: No evidence of metastatic disease. Cord: No evidence of intrathecal metastasis. Accounting for volume averaging, normal cord signal.  Posterior Fossa, vertebral arteries, paraspinal tissues: Brain MRI reported separately. No perispinal mass or inflammation seen. Disc levels: Study is not optimized for degenerative assessment; briefly: C2-3: Disc narrowing and bulging  with mild endplate ridging. C3-4: Degenerative facet spurring asymmetric to the left. Asymmetric left uncovertebral ridging and probable left foraminal impingement C4-5: Incomplete segmentation C5-6: Disc narrowing and bulging with circumferential endplate and uncovertebral spurring. No neural impingement C6-7: Disc narrowing and bulging with endplate and uncovertebral ridging. Negative facets. No neural impingement C7-T1:Mild facet spurring. MRI THORACIC SPINE FINDINGS Alignment:  Unremarkable Vertebrae: No evidence of metastatic disease. Cord: Normal signal and morphology when allowing for volume averaging on STIR imaging. No evidence of intrathecal metastasis. Paraspinal and other soft tissues: Negative for perispinal mass or inflammation. Disc levels: Generalized disc desiccation and narrowing. Small disc herniations are seen at T5-6 to T8-9. Negative facets. MRI LUMBAR SPINE FINDINGS Segmentation: 5 lumbar type vertebrae. There is incomplete segmentation at L5-S1. Alignment:  Physiologic. Vertebrae:  No evidence of bone lesion or incidental fracture Conus medullaris: Extends to the L1-2 level and appears normal. On axial postcontrast imaging there is smooth accentuated enhancement of lower nerve roots seen at L3-4 and below. Paraspinal and other soft tissues: Negative for perispinal mass or adenopathy. Disc levels: T12- L1: Mild disc bulging L1-L2: Mild disc bulging L2-L3: Mild disc space narrowing and bulging.  Mild facet spurring L3-L4: Central herniation combines with ligamentum flavum thickening and facet hypertrophy to cause advanced spinal stenosis. Patent foramina L4-L5: Disc narrowing and bulging. Degenerative facet spurring asymmetric to the left. Patent canal and  foramina. L5-S1:Incomplete segmentation with no degeneration or impingement IMPRESSION: 1. Accentuated nerve root enhancement at the lower lumbar spine, smoothly contoured and only seen below severe spinal stenosis at L3-4, likely reactive/compressive enhancement. If drop metastases are a clinical possibility, lumbar follow-up or CSF cytology could be considered. 2. No osseous metastatic disease. Electronically Signed   By: Jorje Guild M.D.   On: 09/26/2021 07:24   IR IMAGING GUIDED PORT INSERTION  Result Date: 09/24/2021 INDICATION: 86 year old female with history of CNS lymphoma requiring central venous access for chemotherapy. EXAM: IMPLANTED PORT A CATH PLACEMENT WITH ULTRASOUND AND FLUOROSCOPIC GUIDANCE COMPARISON:  None Available. MEDICATIONS: None ANESTHESIA/SEDATION: Moderate (conscious) sedation was employed during this procedure. A total of Versed 1 mg and Fentanyl 50 mcg was administered intravenously. Moderate Sedation Time: 16 minutes. The patient's level of consciousness and vital signs were monitored continuously by radiology nursing throughout the procedure under my direct supervision. CONTRAST:  None FLUOROSCOPY TIME:  0 minutes (0 mGy) COMPLICATIONS: None immediate. PROCEDURE: The procedure, risks, benefits, and alternatives were explained to the patient. Questions regarding the procedure were encouraged and answered. The patient understands and consents to the procedure. The right neck and chest were prepped with chlorhexidine in a sterile fashion, and a sterile drape was applied covering the operative field. Maximum barrier sterile technique with sterile gowns and gloves were used for the procedure. A timeout was performed prior to the initiation of the procedure. Ultrasound was used to examine the jugular vein which was compressible and free of internal echoes. A skin marker was used to demarcate the planned venotomy and port pocket incision sites. Local anesthesia was provided to these  sites and the subcutaneous tunnel track with 1% lidocaine with 1:100,000 epinephrine. A small incision was created at the jugular access site and blunt dissection was performed of the subcutaneous tissues. Under ultrasound guidance, the jugular vein was accessed with a 21 ga micropuncture needle and an 0.018" wire was inserted to the superior vena cava. Real-time ultrasound guidance was utilized for vascular access including the acquisition of a permanent ultrasound image documenting patency of  the accessed vessel. A 5 Fr micopuncture set was then used, through which a 0.035" Rosen wire was passed under fluoroscopic guidance into the inferior vena cava. An 8 Fr dilator was then placed over the wire. A subcutaneous port pocket was then created along the upper chest wall utilizing a combination of sharp and blunt dissection. The pocket was irrigated with sterile saline, packed with gauze, and observed for hemorrhage. A single lumen slim sized power injectable port was chosen for placement. The 8 Fr catheter was tunneled from the port pocket site to the venotomy incision. The port was placed in the pocket. The external catheter was trimmed to appropriate length. The dilator was exchanged for an 8 Fr peel-away sheath under fluoroscopic guidance. The catheter was then placed through the sheath and the sheath was removed. Final catheter positioning was confirmed and documented with a fluoroscopic spot radiograph. The port was accessed with a Huber needle, aspirated, and flushed with heparinized saline. The deep dermal layer of the port pocket incision was closed with interrupted 3-0 Vicryl suture. The skin was opposed with a running subcuticular 4-0 Monocryl suture. Dermabond was then placed over the port pocket and neck incisions. The patient tolerated the procedure well without immediate post procedural complication. FINDINGS: After catheter placement, the tip lies within the superior cavoatrial junction. The catheter  aspirates and flushes normally and is ready for immediate use. IMPRESSION: Successful placement of a power injectable Port-A-Cath via the right internal jugular vein. The catheter is ready for immediate use. Ruthann Cancer, MD [facility] Radiology Electronically Signed   By: Ruthann Cancer M.D.   On: 09/24/2021 16:50   NM PET Image Initial (PI) Whole Body  Result Date: 09/24/2021 CLINICAL DATA:  Initial treatment strategy for lymphoma. EXAM: NUCLEAR MEDICINE PET WHOLE BODY TECHNIQUE: 7.8 mCi F-18 FDG was injected intravenously. Full-ring PET imaging was performed from the head to foot after the radiotracer. CT data was obtained and used for attenuation correction and anatomic localization. Fasting blood glucose: 92 mg/dl COMPARISON:  CT chest, abdomen and pelvis dated Aug 28, 2021. FINDINGS: Mediastinal blood pool activity: SUV max 2.2 HEAD/NECK: Focal hypermetabolic activity seen in the right frontal lobe and right basal ganglia. No FDG avid cervical lymph nodes. Incidental CT findings: none CHEST: Hypermetabolic left lower lobe pulmonary nodule measuring 1.4 x 1.4 cm on series 7, image 49 with an SUV max of 5.6, finding is new when compared with Aug 28, 2021 exam. No hypermetabolic lymph nodes seen in the chest. Incidental CT findings: Mild atherosclerotic disease of the thoracic aorta. ABDOMEN/PELVIS: No abnormal hypermetabolic activity within the liver, pancreas, adrenal glands, or spleen. Mildly hypermetabolic subcentimeter right inguinal lymph node measuring mm in short axis on series 4, image 200 with SUV max of 3.6, unchanged in size when compared to prior exam. Incidental CT findings: Splenic calcifications, likely sequela of prior granulomatous infection. Diverticulosis. Atherosclerotic disease of the abdominal aorta. SKELETON: No focal hypermetabolic activity to suggest skeletal metastasis. Incidental CT findings: none EXTREMITIES: No abnormal  hypermetabolic activity in the lower extremities. Incidental CT findings: none IMPRESSION: 1. Focal hypermetabolic activity seen in the right frontal lobe and right basal ganglia, compatible with known CNS lesions. 2. Hypermetabolic left lower lobe pulmonary nodule which is new when compared with Aug 28, 2021 prior and potentially infectious. Recommend short-term follow-up CT in 3 months for further evaluation. 3. Mildly hypermetabolic subcentimeter right inguinal lymph node, potentially reactive. Recommend attention on follow-up. Electronically Signed   By:  Yetta Glassman M.D.   On: 09/24/2021 08:40   CT HEAD WO CONTRAST  Result Date: 09/03/2021 CLINICAL DATA:  Brain/CNS neoplasm, assess treatment response EXAM: CT HEAD WITHOUT CONTRAST TECHNIQUE: Contiguous axial images were obtained from the base of the skull through the vertex without intravenous contrast. RADIATION DOSE REDUCTION: This exam was performed according to the departmental dose-optimization program which includes automated exposure control, adjustment of the mA and/or kV according to patient size and/or use of iterative reconstruction technique. COMPARISON:  CT head May 17, 23. MRI head Aug 28, 2021 and Aug 27, 2021. FINDINGS: Brain: Postsurgical changes of right frontal burr hole and biopsy with small volume of intraparenchymal and extra-axial hemorrhage and gas in this region. Redemonstrated multiple masses, largest in the right basal ganglia and better characterized on prior MRI. Similar extensive surrounding vasogenic edema. Mass effect is similar versus slightly increased with 9 mm of leftward midline shift. No evidence of acute large vascular territory infarct or hydrocephalus. Vascular: No hyperdense vessel identified. Skull: Right frontal burr hole. Little bit of overlying gas/edema in the scalp. No acute fracture. Sinuses/Orbits: Clear sinuses. Other: No mastoid effusions. IMPRESSION: 1. Postsurgical changes of right frontal biopsy  with small volume of intraparenchymal and extra-axial hemorrhage and gas in this region. 2. Redemonstrated multiple masses, largest in the right basal ganglia and better characterized on recent MRI. Similar extensive surrounding vasogenic edema. Mass effect is similar versus slightly increased with 9 mm of leftward midline shift. Electronically Signed   By: Margaretha Sheffield M.D.   On: 09/03/2021 09:38     Pathology: SURGICAL PATHOLOGY  CASE: [id number]  PATIENT: [facility]  Surgical Pathology Report   Clinical History: brain tumor (cm)   FINAL MICROSCOPIC DIAGNOSIS:   A. BRAIN TUMOR, RIGHT FRONTAL LOBE, BIOPSY:  -  Diffuse large B-cell lymphoma.  B. BRAIN TUMOR, RIGHT FRONTAL LOBE, BIOPSY:  -  Diffuse large B-cell lymphoma.   Note: The cells of interest consist of large cells with prominent small  nuclear membrane bound nucleoli and apparent scant cytoplasm.  Immunohistochemical stains with appropriate controls were performed on  both parts A and B given the morphologic differences (likely frozen  section artifact).  The cells of interest are positive for CD20 and PAX5  consistent with B-cell origin.  GFAP highlights background brain  parenchyma.  These same cells coexpress MUM1 (greater than 50%) BCL6 and  CD5.  CD10 is essentially negative.  Bcl-2 is equivocal given high  background but interpreted as likely positive.  CD3 highlights  background small T cells.  The proliferation rate by Ki-67 is  approximately 50%.  CD30 is negative.  EBER ISH is negative.  P53 shows  scattered positive cells of uncertain clinical significance.  Overall  the findings are consistent with a diffuse large B-cell lymphoma; if a  systemic work-up is negative for lymphoma, the findings would be  consistent with a primary CNS lymphoma.  The immunophenotype is most  consistent with a post germinal center/activated immunophenotype.  There  is sufficient material present for further FISH/molecular  work-up if  clinically indicated (please call office for add-on testing if  necessary).   Dr. Gari Crown has peer reviewed the case and agrees with the interpretation.   INTRAOPERATIVE DIAGNOSIS:   A. Brain Tumor, Right Frontal Lobe, Biopsy: "High grade astrocytoma."  Intraoperative diagnosis rendered by Dr. Thornell Sartorius at 14:55 on 02 Sep 2021.    Assessment/Plan Primary CNS Lymphoma  09/30/21: -Methotrexate 3.5gm/m2 today -Keep IVF at 125/hr -Supplemental K+  ordered -Resumed home cytomel -Leucovorin to start tomorrow, rituximab 6/22  ----------------------------------------------------------------------  -Once urine alkalinized and labs reviewed, appropriate to proceed with C1 of high dose MTX with leucovorin rescue inpatient -MTX-R therapy C1D1 hopefully tomorrow 09/30/21: Methotrexate 3.5g/m2 given over 4 hours once urine pH >= 7.0 -Leucovorin 35m q6 hours 24 hours after MTX infusion   -Rituxan can be given 24-48 hours after MTX infusion -To start once electrolytes stabilized: IV fluids, dextrose with 1536m sodium bicarbonate at 12536mr.  Need to adjust rate to urine pH >= 7 -daily labs with cbc, cmp -daily urine pH once it reaches target pH of >7 -daily MTX levels following MTX infusion until MTX levels <0.10 -continue same supportive medications with exception of protonix which interferes with clearance of MTX  -decrease decadron to 4mg73mily -LFTs and renal function both normal at this time   May continue home meds as prior, we will hold vitamins and supplements during the admission.  The total time spent in the encounter was  35 minutes  and more than 50% was on counseling and review of test results  Please call/text/page with any clinical concerns, updates.  ZachVentura Sellers Medical Director of Neuro-Oncology [facility]es[facility] Creek06/23 5:02 PM

## 2021-09-30 NOTE — Progress Notes (Signed)
Methotrexate completed at 1850.  Pt did well.

## 2021-09-30 NOTE — Progress Notes (Signed)
Calculated CrCl 72 ml/min using actual Scr = 0.63. Ok to proceed with full dose Methotrexate today per MD.  Acquanetta Belling, RPH, BCPS, BCOP 09/30/2021 10:39 AM

## 2021-09-30 NOTE — Telephone Encounter (Signed)
Not enough for the Microalbumin, only enough for a UA. Patient is in the hospital currently

## 2021-09-30 NOTE — Progress Notes (Signed)
Premeds administered.  Methotrexate IV infusing initial dose 6.265g  started at 1423 via port to run over 4 hours.   Will continue to monitor patient.

## 2021-09-30 NOTE — Progress Notes (Signed)
  Transition of Care [facility]) Screening Note   Patient Details  Name: Berda Shelvin Date of Birth: 06-11-1935   Transition of Care [facility]) CM/SW Contact:    Clemon Devaul, Marjie Skiff, RN Phone Number: 09/30/2021, 2:31 PM    Transition of Care Department [facility]) has reviewed patient and no TOC needs have been identified at this time. We will continue to monitor patient advancement through interdisciplinary progression rounds. If new patient transition needs arise, please place a TOC consult.

## 2021-10-01 DIAGNOSIS — C8589 Other specified types of non-Hodgkin lymphoma, extranodal and solid organ sites: Secondary | ICD-10-CM

## 2021-10-01 LAB — COMPREHENSIVE METABOLIC PANEL
ALT: 63 U/L — ABNORMAL HIGH (ref 0–44)
AST: 55 U/L — ABNORMAL HIGH (ref 15–41)
Albumin: 3 g/dL — ABNORMAL LOW (ref 3.5–5.0)
Alkaline Phosphatase: 34 U/L — ABNORMAL LOW (ref 38–126)
Anion gap: 8 (ref 5–15)
BUN: 18 mg/dL (ref 8–23)
CO2: 33 mmol/L — ABNORMAL HIGH (ref 22–32)
Calcium: 8.3 mg/dL — ABNORMAL LOW (ref 8.9–10.3)
Chloride: 100 mmol/L (ref 98–111)
Creatinine, Ser: 0.68 mg/dL (ref 0.44–1.00)
GFR, Estimated: >60 mL/min (ref >60)
Glucose, Bld: 120 mg/dL — ABNORMAL HIGH (ref 70–99)
Potassium: 3 mmol/L — ABNORMAL LOW (ref 3.5–5.1)
Sodium: 141 mmol/L (ref 135–145)
Total Bilirubin: 1.3 mg/dL — ABNORMAL HIGH (ref 0.3–1.2)
Total Protein: 5.8 g/dL — ABNORMAL LOW (ref 6.5–8.1)

## 2021-10-01 LAB — CBC WITH DIFFERENTIAL/PLATELET
Abs Immature Granulocytes: 0.05 10*3/uL (ref 0.00–0.07)
Basophils Absolute: 0 10*3/uL (ref 0.0–0.1)
Basophils Relative: 0 %
Eosinophils Absolute: 0 10*3/uL (ref 0.0–0.5)
Eosinophils Relative: 0 %
HCT: 31.6 % — ABNORMAL LOW (ref 36.0–46.0)
Hemoglobin: 10.9 g/dL — ABNORMAL LOW (ref 12.0–15.0)
Immature Granulocytes: 1 %
Lymphocytes Relative: 13 %
Lymphs Abs: 1.1 10*3/uL (ref 0.7–4.0)
MCH: 32.1 pg (ref 26.0–34.0)
MCHC: 34.5 g/dL (ref 30.0–36.0)
MCV: 92.9 fL (ref 80.0–100.0)
Monocytes Absolute: 0.6 10*3/uL (ref 0.1–1.0)
Monocytes Relative: 7 %
Neutro Abs: 6.7 10*3/uL (ref 1.7–7.7)
Neutrophils Relative %: 79 %
Platelets: 163 10*3/uL (ref 150–400)
RBC: 3.4 MIL/uL — ABNORMAL LOW (ref 3.87–5.11)
RDW: 13.9 % (ref 11.5–15.5)
WBC: 8.5 10*3/uL (ref 4.0–10.5)
nRBC: 0 % (ref 0.0–0.2)

## 2021-10-01 LAB — URINALYSIS, ROUTINE W REFLEX MICROSCOPIC
Bilirubin Urine: NEGATIVE
Glucose, UA: NEGATIVE mg/dL
Hgb urine dipstick: NEGATIVE
Ketones, ur: NEGATIVE mg/dL
Leukocytes,Ua: NEGATIVE
Nitrite: NEGATIVE
Protein, ur: NEGATIVE mg/dL
Specific Gravity, Urine: 1.009 (ref 1.005–1.030)
pH: 8 (ref 5.0–8.0)

## 2021-10-01 LAB — METHOTREXATE: Methotrexate: 29.4

## 2021-10-01 MED ORDER — POTASSIUM CHLORIDE 10 MEQ/100ML IV SOLN
10.0000 meq | INTRAVENOUS | Status: AC
  Administered 2021-10-01 (×4): 10 meq via INTRAVENOUS
  Filled 2021-10-01 (×4): qty 100

## 2021-10-01 MED ORDER — SODIUM CHLORIDE 0.9 % IV SOLN
Freq: Once | INTRAVENOUS | Status: AC
  Administered 2021-10-01: 16 mg via INTRAVENOUS
  Filled 2021-10-01: qty 8

## 2021-10-01 MED ORDER — POTASSIUM CHLORIDE CRYS ER 20 MEQ PO TBCR
20.0000 meq | EXTENDED_RELEASE_TABLET | Freq: Two times a day (BID) | ORAL | Status: DC
  Administered 2021-10-01 – 2021-10-03 (×5): 20 meq via ORAL
  Filled 2021-10-01 (×5): qty 1

## 2021-10-01 MED ORDER — LEUCOVORIN CALCIUM INJECTION 100 MG
15.0000 mg | Freq: Four times a day (QID) | INTRAMUSCULAR | Status: AC
  Administered 2021-10-01 – 2021-10-02 (×4): 16 mg via INTRAVENOUS
  Filled 2021-10-01 (×10): qty 0.8

## 2021-10-01 NOTE — Progress Notes (Signed)
[place] [facility] [place], [place] [address] [phone]   Progress Note  Date of Service: 09/16/21 Patient Name: Daisy Oliver Patient MRN: [id number] Patient DOB: 1936-03-05 Provider: [name], MD  Identifying Statement:  Toriana Sponsel is a 86 y.o. female with right frontal  CNS lymphoma  who presents for first cycle of high dose methotrexate, rituximab.  Referring Provider: [name], MD [place],  [place] [address]  Oncologic History: Oncology History  Primary CNS lymphoma [facility])  09/02/2021 Surgery   Stereotactic biopsy by Dr. Marcello Moores; path is B-cell lymphoma CD20+   09/30/2021 -  Chemotherapy   Patient is on Treatment Plan : IP NON-HODGKINS LYMPHOMA High Dose Methotrexate + Leucovorin Rescue     09/30/2021 -  Chemotherapy   Patient is on Treatment Plan : NON-HODGKINS LYMPHOMA Rituximab q21d       Biomarkers:  CD20 positive .  Ki-67 50% .   Unknown   Unknown   Interval History: Mixtli Reno has no complaints today.  Going to urinate frequently because of all the fluids.  Has some gluten free food from family members.  Medications: No current facility-administered medications on file prior to encounter.   Current Outpatient Medications on File Prior to Encounter  Medication Sig Dispense Refill   acetaminophen (TYLENOL) 650 MG CR tablet Take 1,300 mg by mouth See admin instructions. Take 2 tablets (1300 mg) by mouth daily at bedtime, may also take 2 tablets (1300 mg) during the night as needed for pain     Ascorbic Acid (VITAMIN C PO) Take 1 tablet by mouth 2 (two) times daily.     BIOTIN PO Take 1 tablet by mouth 2 (two) times daily.     dexamethasone (DECADRON) 2 MG tablet Take 2 mg by mouth 2 (two) times daily.     Emollient (CERAVE HEALING) OINT Apply 1 Application topically 2 (two) times daily as needed (wound care/dry skin).     MAGNESIUM PO Take 1 capsule by  mouth 2 (two) times daily.     Melatonin 10 MG TABS Take 10 mg by mouth at bedtime.     Menthol, Topical Analgesic, (BIOFREEZE EX) Apply 1 Application topically 2 (two) times daily as needed (leg pain).     Misc Natural Products (OSTEO BI-FLEX JOINT SHIELD PO) Take 1 tablet by mouth 2 (two) times daily.     Multiple Vitamin (MULTIVITAMIN WITH MINERALS) TABS tablet Take 1 tablet by mouth 2 (two) times daily.     Multiple Vitamins-Minerals (ZINC PO) Take 1 tablet by mouth 2 (two) times daily.     olmesartan (BENICAR) 20 MG tablet Take 1/2 tablet  Daily  for BP (Patient taking differently: Take 10 mg by mouth every morning.   for BP) 45 tablet 3   Polyethyl Glycol-Propyl Glycol (SYSTANE) 0.4-0.3 % GEL ophthalmic gel Place 1 Application into both eyes at bedtime.     Polyethyl Glycol-Propyl Glycol (SYSTANE) 0.4-0.3 % SOLN Place 1 drop into both eyes every morning.     Pramox-PE-Glycerin-Petrolatum (PREPARATION H) 1-0.25-14.4-15 % CREA Apply 1 Application topically 2 (two) times daily as needed (hemorrhoid pain).     TAURINE PO Take 1 capsule by mouth 2 (two) times daily.     tretinoin (RETIN-A) 0.05 % cream Apply topically at bedtime. (Patient taking differently: Apply 1 application  topically every Monday, Wednesday, and Friday at 8 PM.) 45 g 0   Zinc Oxide 40 %  PSTE Apply 1 Application topically 2 (two) times daily as needed (wound care/barrier cream).     acetaminophen (TYLENOL) 325 MG tablet Take 650 mg by mouth every 6 (six) hours as needed (for pain). 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 Herbert Deaner   FRAMELESS  BIOPSY WITH BRAINLAB Right 09/02/2021   Procedure: FRONTAL STEREOTACTIC BRAIN BIOPSY;  Surgeon: Vallarie Mare, MD;  Location: [facility];  Service: Neurosurgery;  Laterality: Right;   IR IMAGING GUIDED PORT INSERTION  09/24/2021   RADIAL HEAD ARTHROPLASTY Right 09/16/2012   Procedure: RADIAL HEAD ARTHROPLASTY;  Surgeon: Johnny Bridge, MD;  Location: [facility];  Service: Orthopedics;  Laterality: Right;  RADIAL HEAD REPLACEMENT     Social History:  Social History   Socioeconomic History   Marital status: Married    Spouse name: Not on file   Number of children: 2   Years of education: Not on file   Highest education level: Not on file  Occupational History   Not on file  Tobacco Use   Smoking status: Former    Packs/day: 2.00    Years: 22.00    Total pack years: 44.00    Types: Cigarettes    Quit date: 09/17/1983    Years since quitting: 38.0   Smokeless tobacco: Never  Vaping Use   Vaping Use: Never used  Substance and Sexual Activity   Alcohol use: Not Currently    Alcohol/week: 0.0 - 1.0 standard drinks of alcohol    Comment: 2/month   Drug use: No   Sexual activity: Not Currently  Other Topics Concern   Not on file  Social History Narrative   Not on file   Social Determinants of Health   Financial Resource Strain: Not on file  Food  Insecurity: Not on file  Transportation Needs: Not on file  Physical Activity: Sufficiently Active (09/14/2017)   Exercise Vital Sign    Days of Exercise per Week: 5 days    Minutes of Exercise per Session: 60 min  Stress: No Stress Concern Present (09/14/2017)   [place]    Feeling of Stress : Only a little  Social Connections: Not on file  Intimate Partner Violence: Not on file   Family History:  Family History  Problem Relation Age of Onset   Heart disease Mother  Brain cancer Father 56       Brain tumor, not cancerous   Hypertension Sister    Osteoporosis Sister    Arthritis Sister    Scoliosis Sister    Hypertension Sister    Osteoporosis Sister    Cancer Paternal Grandfather    Suicidality Paternal Grandfather    Breast cancer Neg Hx     Review of Systems: Constitutional: Doesn't report fevers, chills or abnormal weight loss Eyes: Doesn't report blurriness of vision Ears, nose, mouth, throat, and face: Doesn't report sore throat Respiratory: Doesn't report cough, dyspnea or wheezes Cardiovascular: Doesn't report palpitation, chest discomfort  Gastrointestinal:  Doesn't report nausea, constipation, diarrhea GU: Doesn't report incontinence Skin: Doesn't report skin rashes Neurological: Per HPI Musculoskeletal: Doesn't report joint pain Behavioral/Psych: Doesn't report anxiety  Physical Exam: Vitals:   09/30/21 2202 10/01/21 0548  BP: (!) 140/46 120/76  Pulse: (!) 40 (!) 42  Resp: 18 16  Temp: 98.1 F (36.7 C) 97.9 F (36.6 C)  SpO2: 93% 95%   KPS: 80. General: Alert, cooperative, pleasant, in no acute distress Head: Normal EENT: No conjunctival injection or scleral icterus.  Lungs: Resp effort normal Cardiac: Regular rate Abdomen: Non-distended abdomen Skin: No rashes cyanosis or petechiae. Extremities: No clubbing or edema  Neurologic Exam: Mental Status: Awake, alert, attentive to  examiner. Oriented to self and environment. Language is fluent with intact comprehension.  Cranial Nerves: Visual acuity is grossly normal. Visual fields are full. Extra-ocular movements intact. No ptosis. Face is symmetric Motor: Tone and bulk are normal. Power is full in both arms and legs. Reflexes are symmetric, no pathologic reflexes present.  Sensory: Intact to light touch Gait: Orthopedic limitation, cane assisted   Labs: I have reviewed the data as listed    Component Value Date/Time   NA 141 10/01/2021 0500   K 3.0 (L) 10/01/2021 0500   CL 100 10/01/2021 0500   CO2 33 (H) 10/01/2021 0500   GLUCOSE 120 (H) 10/01/2021 0500   BUN 18 10/01/2021 0500   CREATININE 0.68 10/01/2021 0500   CREATININE 0.69 09/23/2021 1611   CALCIUM 8.3 (L) 10/01/2021 0500   PROT 5.8 (L) 10/01/2021 0500   ALBUMIN 3.0 (L) 10/01/2021 0500   AST 55 (H) 10/01/2021 0500   ALT 63 (H) 10/01/2021 0500   ALKPHOS 34 (L) 10/01/2021 0500   BILITOT 1.3 (H) 10/01/2021 0500   GFRNONAA >60 10/01/2021 0500   GFRNONAA 66 09/19/2020 0000   GFRAA 76 09/19/2020 0000   Lab Results  Component Value Date   WBC 8.5 10/01/2021   NEUTROABS 6.7 10/01/2021   HGB 10.9 (L) 10/01/2021   HCT 31.6 (L) 10/01/2021   MCV 92.9 10/01/2021   PLT 163 10/01/2021    Imaging: MR TOTAL SPINE METS SCREENING  Result Date: 09/26/2021 CLINICAL DATA:  Lymphoma staging EXAM: MRI TOTAL SPINE WITHOUT AND WITH CONTRAST TECHNIQUE: Multisequence MR imaging of the spine from the cervical spine to the sacrum was performed prior to and following IV contrast administration for evaluation of spinal metastatic disease. CONTRAST:  1m GADAVIST GADOBUTROL 1 MMOL/ML IV SOLN COMPARISON:  Head CT from 3 days ago FINDINGS: MRI CERVICAL SPINE FINDINGS Alignment: Straightening of cervical lordosis. Mild C3-4 anterolisthesis Vertebrae: No evidence of metastatic disease. Cord: No evidence of intrathecal metastasis. Accounting for volume averaging, normal cord  signal. Posterior Fossa, vertebral arteries, paraspinal tissues: Brain MRI reported separately. No perispinal mass or inflammation seen. Disc levels: Study is not optimized for degenerative assessment; briefly: C2-3: Disc narrowing  and bulging with mild endplate ridging. C3-4: Degenerative facet spurring asymmetric to the left. Asymmetric left uncovertebral ridging and probable left foraminal impingement C4-5: Incomplete segmentation C5-6: Disc narrowing and bulging with circumferential endplate and uncovertebral spurring. No neural impingement C6-7: Disc narrowing and bulging with endplate and uncovertebral ridging. Negative facets. No neural impingement C7-T1:Mild facet spurring. MRI THORACIC SPINE FINDINGS Alignment:  Unremarkable Vertebrae: No evidence of metastatic disease. Cord: Normal signal and morphology when allowing for volume averaging on STIR imaging. No evidence of intrathecal metastasis. Paraspinal and other soft tissues: Negative for perispinal mass or inflammation. Disc levels: Generalized disc desiccation and narrowing. Small disc herniations are seen at T5-6 to T8-9. Negative facets. MRI LUMBAR SPINE FINDINGS Segmentation: 5 lumbar type vertebrae. There is incomplete segmentation at L5-S1. Alignment:  Physiologic. Vertebrae:  No evidence of bone lesion or incidental fracture Conus medullaris: Extends to the L1-2 level and appears normal. On axial postcontrast imaging there is smooth accentuated enhancement of lower nerve roots seen at L3-4 and below. Paraspinal and other soft tissues: Negative for perispinal mass or adenopathy. Disc levels: T12- L1: Mild disc bulging L1-L2: Mild disc bulging L2-L3: Mild disc space narrowing and bulging.  Mild facet spurring L3-L4: Central herniation combines with ligamentum flavum thickening and facet hypertrophy to cause advanced spinal stenosis. Patent foramina L4-L5: Disc narrowing and bulging. Degenerative facet spurring asymmetric to the left. Patent canal  and foramina. L5-S1:Incomplete segmentation with no degeneration or impingement IMPRESSION: 1. Accentuated nerve root enhancement at the lower lumbar spine, smoothly contoured and only seen below severe spinal stenosis at L3-4, likely reactive/compressive enhancement. If drop metastases are a clinical possibility, lumbar follow-up or CSF cytology could be considered. 2. No osseous metastatic disease. Electronically Signed   By: Jorje Guild M.D.   On: 09/26/2021 07:24   IR IMAGING GUIDED PORT INSERTION  Result Date: 09/24/2021 INDICATION: 86 year old female with history of CNS lymphoma requiring central venous access for chemotherapy. EXAM: IMPLANTED PORT A CATH PLACEMENT WITH ULTRASOUND AND FLUOROSCOPIC GUIDANCE COMPARISON:  None Available. MEDICATIONS: None ANESTHESIA/SEDATION: Moderate (conscious) sedation was employed during this procedure. A total of Versed 1 mg and Fentanyl 50 mcg was administered intravenously. Moderate Sedation Time: 16 minutes. The patient's level of consciousness and vital signs were monitored continuously by radiology nursing throughout the procedure under my direct supervision. CONTRAST:  None FLUOROSCOPY TIME:  0 minutes (0 mGy) COMPLICATIONS: None immediate. PROCEDURE: The procedure, risks, benefits, and alternatives were explained to the patient. Questions regarding the procedure were encouraged and answered. The patient understands and consents to the procedure. The right neck and chest were prepped with chlorhexidine in a sterile fashion, and a sterile drape was applied covering the operative field. Maximum barrier sterile technique with sterile gowns and gloves were used for the procedure. A timeout was performed prior to the initiation of the procedure. Ultrasound was used to examine the jugular vein which was compressible and free of internal echoes. A skin marker was used to demarcate the planned venotomy and port pocket incision sites. Local anesthesia was provided to  these sites and the subcutaneous tunnel track with 1% lidocaine with 1:100,000 epinephrine. A small incision was created at the jugular access site and blunt dissection was performed of the subcutaneous tissues. Under ultrasound guidance, the jugular vein was accessed with a 21 ga micropuncture needle and an 0.018" wire was inserted to the superior vena cava. Real-time ultrasound guidance was utilized for vascular access including the acquisition of a permanent ultrasound image documenting  patency of the accessed vessel. A 5 Fr micopuncture set was then used, through which a 0.035" Rosen wire was passed under fluoroscopic guidance into the inferior vena cava. An 8 Fr dilator was then placed over the wire. A subcutaneous port pocket was then created along the upper chest wall utilizing a combination of sharp and blunt dissection. The pocket was irrigated with sterile saline, packed with gauze, and observed for hemorrhage. A single lumen slim sized power injectable port was chosen for placement. The 8 Fr catheter was tunneled from the port pocket site to the venotomy incision. The port was placed in the pocket. The external catheter was trimmed to appropriate length. The dilator was exchanged for an 8 Fr peel-away sheath under fluoroscopic guidance. The catheter was then placed through the sheath and the sheath was removed. Final catheter positioning was confirmed and documented with a fluoroscopic spot radiograph. The port was accessed with a Huber needle, aspirated, and flushed with heparinized saline. The deep dermal layer of the port pocket incision was closed with interrupted 3-0 Vicryl suture. The skin was opposed with a running subcuticular 4-0 Monocryl suture. Dermabond was then placed over the port pocket and neck incisions. The patient tolerated the procedure well without immediate post procedural complication. FINDINGS: After catheter placement, the tip lies within the superior cavoatrial junction. The  catheter aspirates and flushes normally and is ready for immediate use. IMPRESSION: Successful placement of a power injectable Port-A-Cath via the right internal jugular vein. The catheter is ready for immediate use. Ruthann Cancer, MD [facility] Electronically Signed   By: Ruthann Cancer M.D.   On: 09/24/2021 16:50   NM PET Image Initial (PI) Whole Body  Result Date: 09/24/2021 CLINICAL DATA:  Initial treatment strategy for lymphoma. EXAM: NUCLEAR MEDICINE PET WHOLE BODY TECHNIQUE: 7.8 mCi F-18 FDG was injected intravenously. Full-ring PET imaging was performed from the head to foot after the radiotracer. CT data was obtained and used for attenuation correction and anatomic localization. Fasting blood glucose: 92 mg/dl COMPARISON:  CT chest, abdomen and pelvis dated Aug 28, 2021. FINDINGS: Mediastinal blood pool activity: SUV max 2.2 HEAD/NECK: Focal hypermetabolic activity seen in the right frontal lobe and right basal ganglia. No FDG avid cervical lymph nodes. Incidental CT findings: none CHEST: Hypermetabolic left lower lobe pulmonary nodule measuring 1.4 x 1.4 cm on series 7, image 49 with an SUV max of 5.6, finding is new when compared with Aug 28, 2021 exam. No hypermetabolic lymph nodes seen in the chest. Incidental CT findings: Mild atherosclerotic disease of the thoracic aorta. ABDOMEN/PELVIS: No abnormal hypermetabolic activity within the liver, pancreas, adrenal glands, or spleen. Mildly hypermetabolic subcentimeter right inguinal lymph node measuring mm in short axis on series 4, image 200 with SUV max of 3.6, unchanged in size when compared to prior exam. Incidental CT findings: Splenic calcifications, likely sequela of prior granulomatous infection. Diverticulosis. Atherosclerotic disease of the abdominal aorta. SKELETON: No focal hypermetabolic activity to suggest skeletal metastasis. Incidental CT findings: none EXTREMITIES: No abnormal  hypermetabolic activity in the lower extremities. Incidental CT findings: none IMPRESSION: 1. Focal hypermetabolic activity seen in the right frontal lobe and right basal ganglia, compatible with known CNS lesions. 2. Hypermetabolic left lower lobe pulmonary nodule which is new when compared with Aug 28, 2021 prior and potentially infectious. Recommend short-term follow-up CT in 3 months for further evaluation. 3. Mildly hypermetabolic subcentimeter right inguinal lymph node, potentially reactive. Recommend attention on follow-up. Electronically Signed  By: Yetta Glassman M.D.   On: 09/24/2021 08:40   CT HEAD WO CONTRAST  Result Date: 09/03/2021 CLINICAL DATA:  Brain/CNS neoplasm, assess treatment response EXAM: CT HEAD WITHOUT CONTRAST TECHNIQUE: Contiguous axial images were obtained from the base of the skull through the vertex without intravenous contrast. RADIATION DOSE REDUCTION: This exam was performed according to the departmental dose-optimization program which includes automated exposure control, adjustment of the mA and/or kV according to patient size and/or use of iterative reconstruction technique. COMPARISON:  CT head May 17, 23. MRI head Aug 28, 2021 and Aug 27, 2021. FINDINGS: Brain: Postsurgical changes of right frontal burr hole and biopsy with small volume of intraparenchymal and extra-axial hemorrhage and gas in this region. Redemonstrated multiple masses, largest in the right basal ganglia and better characterized on prior MRI. Similar extensive surrounding vasogenic edema. Mass effect is similar versus slightly increased with 9 mm of leftward midline shift. No evidence of acute large vascular territory infarct or hydrocephalus. Vascular: No hyperdense vessel identified. Skull: Right frontal burr hole. Little bit of overlying gas/edema in the scalp. No acute fracture. Sinuses/Orbits: Clear sinuses. Other: No mastoid effusions. IMPRESSION: 1. Postsurgical changes of right frontal biopsy  with small volume of intraparenchymal and extra-axial hemorrhage and gas in this region. 2. Redemonstrated multiple masses, largest in the right basal ganglia and better characterized on recent MRI. Similar extensive surrounding vasogenic edema. Mass effect is similar versus slightly increased with 9 mm of leftward midline shift. Electronically Signed   By: Margaretha Sheffield M.D.   On: 09/03/2021 09:38     Pathology: SURGICAL PATHOLOGY  CASE: [id number]  PATIENT: [facility]  Surgical Pathology Report   Clinical History: brain tumor (cm)   FINAL MICROSCOPIC DIAGNOSIS:   A. BRAIN TUMOR, RIGHT FRONTAL LOBE, BIOPSY:  -  Diffuse large B-cell lymphoma.  B. BRAIN TUMOR, RIGHT FRONTAL LOBE, BIOPSY:  -  Diffuse large B-cell lymphoma.   Note: The cells of interest consist of large cells with prominent small  nuclear membrane bound nucleoli and apparent scant cytoplasm.  Immunohistochemical stains with appropriate controls were performed on  both parts A and B given the morphologic differences (likely frozen  section artifact).  The cells of interest are positive for CD20 and PAX5  consistent with B-cell origin.  GFAP highlights background brain  parenchyma.  These same cells coexpress MUM1 (greater than 50%) BCL6 and  CD5.  CD10 is essentially negative.  Bcl-2 is equivocal given high  background but interpreted as likely positive.  CD3 highlights  background small T cells.  The proliferation rate by Ki-67 is  approximately 50%.  CD30 is negative.  EBER ISH is negative.  P53 shows  scattered positive cells of uncertain clinical significance.  Overall  the findings are consistent with a diffuse large B-cell lymphoma; if a  systemic work-up is negative for lymphoma, the findings would be  consistent with a primary CNS lymphoma.  The immunophenotype is most  consistent with a post germinal center/activated immunophenotype.  There  is sufficient material present for further FISH/molecular  work-up if  clinically indicated (please call office for add-on testing if  necessary).   Dr. Gari Crown has peer reviewed the case and agrees with the interpretation.   INTRAOPERATIVE DIAGNOSIS:   A. Brain Tumor, Right Frontal Lobe, Biopsy: "High grade astrocytoma."  Intraoperative diagnosis rendered by Dr. Thornell Sartorius at 14:55 on 02 Sep 2021.    Assessment/Plan Primary CNS Lymphoma  10/01/21: -Now s/p MTX infusion, tolerated well -Leucovorin started  this afternoon -Keep IVF at 125/hr -IV and oral K+ today -Rituximab tmrw -Trend MTX level tmrw  09/30/21: -Methotrexate 3.5gm/m2 today -Keep IVF at 125/hr -Supplemental K+ ordered -Resumed home cytomel -Leucovorin to start tomorrow, rituximab 6/22  ----------------------------------------------------------------------  -Once urine alkalinized and labs reviewed, appropriate to proceed with C1 of high dose MTX with leucovorin rescue inpatient -MTX-R therapy C1D1 hopefully tomorrow 09/30/21: Methotrexate 3.5g/m2 given over 4 hours once urine pH >= 7.0 -Leucovorin 23m q6 hours 24 hours after MTX infusion   -Rituxan can be given 24-48 hours after MTX infusion -To start once electrolytes stabilized: IV fluids, dextrose with 159m sodium bicarbonate at 12578mr.  Need to adjust rate to urine pH >= 7 -daily labs with cbc, cmp -daily urine pH once it reaches target pH of >7 -daily MTX levels following MTX infusion until MTX levels <0.10 -continue same supportive medications with exception of protonix which interferes with clearance of MTX  -decrease decadron to 4mg64mily -LFTs and renal function both normal at this time   May continue home meds as prior, we will hold vitamins and supplements during the admission.  The total time spent in the encounter was  35 minutes  and more than 50% was on counseling and review of test results  Please call/text/page with any clinical concerns, updates.  ZachVentura Sellers Medical Director of  Neuro-Oncology [facility] [place]06/23 3:14 PM

## 2021-10-02 DIAGNOSIS — C8589 Other specified types of non-Hodgkin lymphoma, extranodal and solid organ sites: Secondary | ICD-10-CM | POA: Diagnosis not present

## 2021-10-02 LAB — CBC WITH DIFFERENTIAL/PLATELET
Abs Immature Granulocytes: 0.03 10*3/uL (ref 0.00–0.07)
Basophils Absolute: 0 10*3/uL (ref 0.0–0.1)
Basophils Relative: 0 %
Eosinophils Absolute: 0 10*3/uL (ref 0.0–0.5)
Eosinophils Relative: 0 %
HCT: 35.2 % — ABNORMAL LOW (ref 36.0–46.0)
Hemoglobin: 11.9 g/dL — ABNORMAL LOW (ref 12.0–15.0)
Immature Granulocytes: 0 %
Lymphocytes Relative: 14 %
Lymphs Abs: 1.3 10*3/uL (ref 0.7–4.0)
MCH: 31.6 pg (ref 26.0–34.0)
MCHC: 33.8 g/dL (ref 30.0–36.0)
MCV: 93.4 fL (ref 80.0–100.0)
Monocytes Absolute: 0.5 10*3/uL (ref 0.1–1.0)
Monocytes Relative: 5 %
Neutro Abs: 7.8 10*3/uL — ABNORMAL HIGH (ref 1.7–7.7)
Neutrophils Relative %: 81 %
Platelets: 184 10*3/uL (ref 150–400)
RBC: 3.77 MIL/uL — ABNORMAL LOW (ref 3.87–5.11)
RDW: 14.3 % (ref 11.5–15.5)
WBC: 9.7 10*3/uL (ref 4.0–10.5)
nRBC: 0 % (ref 0.0–0.2)

## 2021-10-02 LAB — URINALYSIS, ROUTINE W REFLEX MICROSCOPIC
Bilirubin Urine: NEGATIVE
Glucose, UA: NEGATIVE mg/dL
Hgb urine dipstick: NEGATIVE
Ketones, ur: NEGATIVE mg/dL
Leukocytes,Ua: NEGATIVE
Nitrite: NEGATIVE
Protein, ur: NEGATIVE mg/dL
Specific Gravity, Urine: 1.01 (ref 1.005–1.030)
pH: 9 — ABNORMAL HIGH (ref 5.0–8.0)

## 2021-10-02 LAB — COMPREHENSIVE METABOLIC PANEL
ALT: 62 U/L — ABNORMAL HIGH (ref 0–44)
AST: 45 U/L — ABNORMAL HIGH (ref 15–41)
Albumin: 3 g/dL — ABNORMAL LOW (ref 3.5–5.0)
Alkaline Phosphatase: 36 U/L — ABNORMAL LOW (ref 38–126)
Anion gap: 7 (ref 5–15)
BUN: 17 mg/dL (ref 8–23)
CO2: 32 mmol/L (ref 22–32)
Calcium: 8.6 mg/dL — ABNORMAL LOW (ref 8.9–10.3)
Chloride: 101 mmol/L (ref 98–111)
Creatinine, Ser: 0.6 mg/dL (ref 0.44–1.00)
GFR, Estimated: >60 mL/min (ref >60)
Glucose, Bld: 117 mg/dL — ABNORMAL HIGH (ref 70–99)
Potassium: 3.5 mmol/L (ref 3.5–5.1)
Sodium: 140 mmol/L (ref 135–145)
Total Bilirubin: 0.9 mg/dL (ref 0.3–1.2)
Total Protein: 6 g/dL — ABNORMAL LOW (ref 6.5–8.1)

## 2021-10-02 LAB — METHOTREXATE: Methotrexate: 0.53

## 2021-10-02 MED ORDER — LEUCOVORIN CALCIUM INJECTION 100 MG
15.0000 mg | Freq: Four times a day (QID) | INTRAMUSCULAR | Status: AC
  Administered 2021-10-02 – 2021-10-03 (×4): 16 mg via INTRAVENOUS
  Filled 2021-10-02 (×12): qty 0.8

## 2021-10-02 MED ORDER — SODIUM CHLORIDE 0.9 % IV SOLN
375.0000 mg/m2 | Freq: Once | INTRAVENOUS | Status: AC
  Administered 2021-10-02: 700 mg via INTRAVENOUS
  Filled 2021-10-02: qty 50

## 2021-10-02 MED ORDER — DIPHENHYDRAMINE HCL 25 MG PO CAPS
50.0000 mg | ORAL_CAPSULE | Freq: Once | ORAL | Status: AC
  Administered 2021-10-02: 50 mg via ORAL
  Filled 2021-10-02: qty 2

## 2021-10-02 MED ORDER — SODIUM CHLORIDE 0.9 % IV SOLN: Freq: Once | INTRAVENOUS | Status: AC

## 2021-10-02 MED ORDER — ACETAMINOPHEN 325 MG PO TABS
650.0000 mg | ORAL_TABLET | Freq: Once | ORAL | Status: AC
  Administered 2021-10-02: 650 mg via ORAL
  Filled 2021-10-02: qty 2

## 2021-10-02 NOTE — Care Management Important Message (Signed)
Important Message  Patient Details IM Letter placed in Patients room. Name: Daisy Oliver MRN: [id number] Date of Birth: 1935-11-24   Medicare Important Message Given:  Yes     Kerin Salen 10/02/2021, 10:36 AM

## 2021-10-02 NOTE — Progress Notes (Signed)
[facility] at [address], [place] [address] [phone]   Progress Note  Date of Service: 09/16/21 Patient Name: Daisy Oliver Patient MRN: [id number] Patient DOB: 01/31/1936 Provider: [name], MD  Identifying Statement:  Daisy Oliver is a 86 y.o. female with right frontal  CNS lymphoma  who presents for first cycle of high dose methotrexate, rituximab.  Referring Provider: [name], MD [place],  [place] [address]  Oncologic History: Oncology History  Primary CNS lymphoma [facility])  09/02/2021 Surgery   Stereotactic biopsy by Dr. Marcello Moores; path is B-cell lymphoma CD20+   09/30/2021 -  Chemotherapy   Patient is on Treatment Plan : IP NON-HODGKINS LYMPHOMA High Dose Methotrexate + Leucovorin Rescue     10/02/2021 -  Chemotherapy   Patient is on Treatment Plan : NON-HODGKINS LYMPHOMA Rituximab q21d       Biomarkers:  CD20 positive .  Ki-67 50% .   Unknown   Unknown   Interval History: Daisy Oliver has no complaints today.  She received the rituximab this morning.  Denies headaches.  Medications: No current facility-administered medications on file prior to encounter.   Current Outpatient Medications on File Prior to Encounter  Medication Sig Dispense Refill   acetaminophen (TYLENOL) 650 MG CR tablet Take 1,300 mg by mouth See admin instructions. Take 2 tablets (1300 mg) by mouth daily at bedtime, may also take 2 tablets (1300 mg) during the night as needed for pain     Ascorbic Acid (VITAMIN C PO) Take 1 tablet by mouth 2 (two) times daily.     BIOTIN PO Take 1 tablet by mouth 2 (two) times daily.     dexamethasone (DECADRON) 2 MG tablet Take 2 mg by mouth 2 (two) times daily.     Emollient (CERAVE HEALING) OINT Apply 1 Application topically 2 (two) times daily as needed (wound care/dry skin).     MAGNESIUM PO Take 1 capsule by mouth 2 (two) times daily.     Melatonin 10 MG  TABS Take 10 mg by mouth at bedtime.     Menthol, Topical Analgesic, (BIOFREEZE EX) Apply 1 Application topically 2 (two) times daily as needed (leg pain).     Misc Natural Products (OSTEO BI-FLEX JOINT SHIELD PO) Take 1 tablet by mouth 2 (two) times daily.     Multiple Vitamin (MULTIVITAMIN WITH MINERALS) TABS tablet Take 1 tablet by mouth 2 (two) times daily.     Multiple Vitamins-Minerals (ZINC PO) Take 1 tablet by mouth 2 (two) times daily.     olmesartan (BENICAR) 20 MG tablet Take 1/2 tablet  Daily  for BP (Patient taking differently: Take 10 mg by mouth every morning.   for BP) 45 tablet 3   Polyethyl Glycol-Propyl Glycol (SYSTANE) 0.4-0.3 % GEL ophthalmic gel Place 1 Application into both eyes at bedtime.     Polyethyl Glycol-Propyl Glycol (SYSTANE) 0.4-0.3 % SOLN Place 1 drop into both eyes every morning.     Pramox-PE-Glycerin-Petrolatum (PREPARATION H) 1-0.25-14.4-15 % CREA Apply 1 Application topically 2 (two) times daily as needed (hemorrhoid pain).     TAURINE PO Take 1 capsule by mouth 2 (two) times daily.     tretinoin (RETIN-A) 0.05 % cream Apply topically at bedtime. (Patient taking differently: Apply 1 application  topically every Monday, Wednesday, and Friday at 8 PM.) 45 g 0   Zinc Oxide 40 % PSTE Apply 1 Application topically 2 (two) times  daily as needed (wound care/barrier cream).     acetaminophen (TYLENOL) 325 MG tablet Take 650 mg by mouth every 6 (six) hours as needed (for pain). (Patient not taking: Reported on 09/29/2021)      Allergies:  Allergies  Allergen Reactions   Barbiturates Other (See Comments)    Unknown reaction   Other Other (See Comments)    Patient has AIP and is not to have hormones or red wine   Pentothal [Thiopental] Other (See Comments)    Unknown reaction   Sulfa Antibiotics Other (See Comments)    Unknown reaction   Wheat Bran Nausea And Vomiting   Past Medical History:  Past Medical History:  Diagnosis Date   Acute intermittent  porphyria ([place])    diagnosed at age 46   COVID-19 08/26/2020   Fracture of radial head, right, closed 09/16/2012   GERD (gastroesophageal reflux disease)    pepcid prn   Hypertension    Hypothyroidism    PONV (postoperative nausea and vomiting)    Past Surgical History:  Past Surgical History:  Procedure Laterality Date   APPLICATION OF CRANIAL NAVIGATION Right 09/02/2021   Procedure: APPLICATION OF CRANIAL NAVIGATION;  Surgeon: Vallarie Mare, MD;  Location: [facility];  Service: Neurosurgery;  Laterality: Right;   CATARACT EXTRACTION, BILATERAL  2020   Dr. Herbert Deaner   FRAMELESS  BIOPSY WITH BRAINLAB Right 09/02/2021   Procedure: FRONTAL STEREOTACTIC BRAIN BIOPSY;  Surgeon: Vallarie Mare, MD;  Location: [facility];  Service: Neurosurgery;  Laterality: Right;   IR IMAGING GUIDED PORT INSERTION  09/24/2021   RADIAL HEAD ARTHROPLASTY Right 09/16/2012   Procedure: RADIAL HEAD ARTHROPLASTY;  Surgeon: Johnny Bridge, MD;  Location: [facility];  Service: Orthopedics;  Laterality: Right;  RADIAL HEAD REPLACEMENT     Social History:  Social History   Socioeconomic History   Marital status: Married    Spouse name: Not on file   Number of children: 2   Years of education: Not on file   Highest education level: Not on file  Occupational History   Not on file  Tobacco Use   Smoking status: Former    Packs/day: 2.00    Years: 22.00    Total pack years: 44.00    Types: Cigarettes    Quit date: 09/17/1983    Years since quitting: 38.0   Smokeless tobacco: Never  Vaping Use   Vaping Use: Never used  Substance and Sexual Activity   Alcohol use: Not Currently    Alcohol/week: 0.0 - 1.0 standard drinks of alcohol    Comment: 2/month   Drug use: No   Sexual activity: Not Currently  Other Topics Concern   Not on file  Social History Narrative   Not on file   Social Determinants of Health   Financial Resource Strain: Not on file  Food Insecurity: Not on file   Transportation Needs: Not on file  Physical Activity: Sufficiently Active (09/14/2017)   Exercise Vital Sign    Days of Exercise per Week: 5 days    Minutes of Exercise per Session: 60 min  Stress: No Stress Concern Present (09/14/2017)   [place]    Feeling of Stress : Only a little  Social Connections: Not on file  Intimate Partner Violence: Not on file   Family History:  Family History  Problem Relation Age of Onset   Heart disease Mother    Brain cancer Father 53  Brain tumor, not cancerous   Hypertension Sister    Osteoporosis Sister    Arthritis Sister    Scoliosis Sister    Hypertension Sister    Osteoporosis Sister    Cancer Paternal Grandfather    Suicidality Paternal Grandfather    Breast cancer Neg Hx     Review of Systems: Constitutional: Doesn't report fevers, chills or abnormal weight loss Eyes: Doesn't report blurriness of vision Ears, nose, mouth, throat, and face: Doesn't report sore throat Respiratory: Doesn't report cough, dyspnea or wheezes Cardiovascular: Doesn't report palpitation, chest discomfort  Gastrointestinal:  Doesn't report nausea, constipation, diarrhea GU: Doesn't report incontinence Skin: Doesn't report skin rashes Neurological: Per HPI Musculoskeletal: Doesn't report joint pain Behavioral/Psych: Doesn't report anxiety  Physical Exam: Vitals:   10/02/21 0524 10/02/21 1214  BP: (!) 147/57 128/61  Pulse: (!) 52 62  Resp: 14 16  Temp: 98.2 F (36.8 C) 98.4 F (36.9 C)  SpO2: 95% 96%   KPS: 80. General: Alert, cooperative, pleasant, in no acute distress Head: Normal EENT: No conjunctival injection or scleral icterus.  Lungs: Resp effort normal Cardiac: Regular rate Abdomen: Non-distended abdomen Skin: No rashes cyanosis or petechiae. Extremities: No clubbing or edema  Neurologic Exam: Mental Status: Awake, alert, attentive to examiner. Oriented to self  and environment. Language is fluent with intact comprehension.  Cranial Nerves: Visual acuity is grossly normal. Visual fields are full. Extra-ocular movements intact. No ptosis. Face is symmetric Motor: Tone and bulk are normal. Power is full in both arms and legs. Reflexes are symmetric, no pathologic reflexes present.  Sensory: Intact to light touch Gait: Orthopedic limitation, cane assisted   Labs: I have reviewed the data as listed    Component Value Date/Time   NA 140 10/02/2021 0516   K 3.5 10/02/2021 0516   CL 101 10/02/2021 0516   CO2 32 10/02/2021 0516   GLUCOSE 117 (H) 10/02/2021 0516   BUN 17 10/02/2021 0516   CREATININE 0.60 10/02/2021 0516   CREATININE 0.69 09/23/2021 1611   CALCIUM 8.6 (L) 10/02/2021 0516   PROT 6.0 (L) 10/02/2021 0516   ALBUMIN 3.0 (L) 10/02/2021 0516   AST 45 (H) 10/02/2021 0516   ALT 62 (H) 10/02/2021 0516   ALKPHOS 36 (L) 10/02/2021 0516   BILITOT 0.9 10/02/2021 0516   GFRNONAA >60 10/02/2021 0516   GFRNONAA 66 09/19/2020 0000   GFRAA 76 09/19/2020 0000   Lab Results  Component Value Date   WBC 9.7 10/02/2021   NEUTROABS 7.8 (H) 10/02/2021   HGB 11.9 (L) 10/02/2021   HCT 35.2 (L) 10/02/2021   MCV 93.4 10/02/2021   PLT 184 10/02/2021    Imaging: MR TOTAL SPINE METS SCREENING  Result Date: 09/26/2021 CLINICAL DATA:  Lymphoma staging EXAM: MRI TOTAL SPINE WITHOUT AND WITH CONTRAST TECHNIQUE: Multisequence MR imaging of the spine from the cervical spine to the sacrum was performed prior to and following IV contrast administration for evaluation of spinal metastatic disease. CONTRAST:  32m GADAVIST GADOBUTROL 1 MMOL/ML IV SOLN COMPARISON:  Head CT from 3 days ago FINDINGS: MRI CERVICAL SPINE FINDINGS Alignment: Straightening of cervical lordosis. Mild C3-4 anterolisthesis Vertebrae: No evidence of metastatic disease. Cord: No evidence of intrathecal metastasis. Accounting for volume averaging, normal cord signal. Posterior Fossa, vertebral  arteries, paraspinal tissues: Brain MRI reported separately. No perispinal mass or inflammation seen. Disc levels: Study is not optimized for degenerative assessment; briefly: C2-3: Disc narrowing and bulging with mild endplate ridging. C3-4: Degenerative facet spurring asymmetric to the  left. Asymmetric left uncovertebral ridging and probable left foraminal impingement C4-5: Incomplete segmentation C5-6: Disc narrowing and bulging with circumferential endplate and uncovertebral spurring. No neural impingement C6-7: Disc narrowing and bulging with endplate and uncovertebral ridging. Negative facets. No neural impingement C7-T1:Mild facet spurring. MRI THORACIC SPINE FINDINGS Alignment:  Unremarkable Vertebrae: No evidence of metastatic disease. Cord: Normal signal and morphology when allowing for volume averaging on STIR imaging. No evidence of intrathecal metastasis. Paraspinal and other soft tissues: Negative for perispinal mass or inflammation. Disc levels: Generalized disc desiccation and narrowing. Small disc herniations are seen at T5-6 to T8-9. Negative facets. MRI LUMBAR SPINE FINDINGS Segmentation: 5 lumbar type vertebrae. There is incomplete segmentation at L5-S1. Alignment:  Physiologic. Vertebrae:  No evidence of bone lesion or incidental fracture Conus medullaris: Extends to the L1-2 level and appears normal. On axial postcontrast imaging there is smooth accentuated enhancement of lower nerve roots seen at L3-4 and below. Paraspinal and other soft tissues: Negative for perispinal mass or adenopathy. Disc levels: T12- L1: Mild disc bulging L1-L2: Mild disc bulging L2-L3: Mild disc space narrowing and bulging.  Mild facet spurring L3-L4: Central herniation combines with ligamentum flavum thickening and facet hypertrophy to cause advanced spinal stenosis. Patent foramina L4-L5: Disc narrowing and bulging. Degenerative facet spurring asymmetric to the left. Patent canal and foramina. L5-S1:Incomplete  segmentation with no degeneration or impingement IMPRESSION: 1. Accentuated nerve root enhancement at the lower lumbar spine, smoothly contoured and only seen below severe spinal stenosis at L3-4, likely reactive/compressive enhancement. If drop metastases are a clinical possibility, lumbar follow-up or CSF cytology could be considered. 2. No osseous metastatic disease. Electronically Signed   By: Jorje Guild M.D.   On: 09/26/2021 07:24   IR IMAGING GUIDED PORT INSERTION  Result Date: 09/24/2021 INDICATION: 86 year old female with history of CNS lymphoma requiring central venous access for chemotherapy. EXAM: IMPLANTED PORT A CATH PLACEMENT WITH ULTRASOUND AND FLUOROSCOPIC GUIDANCE COMPARISON:  None Available. MEDICATIONS: None ANESTHESIA/SEDATION: Moderate (conscious) sedation was employed during this procedure. A total of Versed 1 mg and Fentanyl 50 mcg was administered intravenously. Moderate Sedation Time: 16 minutes. The patient's level of consciousness and vital signs were monitored continuously by radiology nursing throughout the procedure under my direct supervision. CONTRAST:  None FLUOROSCOPY TIME:  0 minutes (0 mGy) COMPLICATIONS: None immediate. PROCEDURE: The procedure, risks, benefits, and alternatives were explained to the patient. Questions regarding the procedure were encouraged and answered. The patient understands and consents to the procedure. The right neck and chest were prepped with chlorhexidine in a sterile fashion, and a sterile drape was applied covering the operative field. Maximum barrier sterile technique with sterile gowns and gloves were used for the procedure. A timeout was performed prior to the initiation of the procedure. Ultrasound was used to examine the jugular vein which was compressible and free of internal echoes. A skin marker was used to demarcate the planned venotomy and port pocket incision sites. Local anesthesia was provided to these sites and the subcutaneous  tunnel track with 1% lidocaine with 1:100,000 epinephrine. A small incision was created at the jugular access site and blunt dissection was performed of the subcutaneous tissues. Under ultrasound guidance, the jugular vein was accessed with a 21 ga micropuncture needle and an 0.018" wire was inserted to the superior vena cava. Real-time ultrasound guidance was utilized for vascular access including the acquisition of a permanent ultrasound image documenting patency of the accessed vessel. A 5 Fr micopuncture set was then used,  through which a 0.035" Rosen wire was passed under fluoroscopic guidance into the inferior vena cava. An 8 Fr dilator was then placed over the wire. A subcutaneous port pocket was then created along the upper chest wall utilizing a combination of sharp and blunt dissection. The pocket was irrigated with sterile saline, packed with gauze, and observed for hemorrhage. A single lumen slim sized power injectable port was chosen for placement. The 8 Fr catheter was tunneled from the port pocket site to the venotomy incision. The port was placed in the pocket. The external catheter was trimmed to appropriate length. The dilator was exchanged for an 8 Fr peel-away sheath under fluoroscopic guidance. The catheter was then placed through the sheath and the sheath was removed. Final catheter positioning was confirmed and documented with a fluoroscopic spot radiograph. The port was accessed with a Huber needle, aspirated, and flushed with heparinized saline. The deep dermal layer of the port pocket incision was closed with interrupted 3-0 Vicryl suture. The skin was opposed with a running subcuticular 4-0 Monocryl suture. Dermabond was then placed over the port pocket and neck incisions. The patient tolerated the procedure well without immediate post procedural complication. FINDINGS: After catheter placement, the tip lies within the superior cavoatrial junction. The catheter aspirates and flushes  normally and is ready for immediate use. IMPRESSION: Successful placement of a power injectable Port-A-Cath via the right internal jugular vein. The catheter is ready for immediate use. Ruthann Cancer, MD [facility] Electronically Signed   By: Ruthann Cancer M.D.   On: 09/24/2021 16:50   NM PET Image Initial (PI) Whole Body  Result Date: 09/24/2021 CLINICAL DATA:  Initial treatment strategy for lymphoma. EXAM: NUCLEAR MEDICINE PET WHOLE BODY TECHNIQUE: 7.8 mCi F-18 FDG was injected intravenously. Full-ring PET imaging was performed from the head to foot after the radiotracer. CT data was obtained and used for attenuation correction and anatomic localization. Fasting blood glucose: 92 mg/dl COMPARISON:  CT chest, abdomen and pelvis dated Aug 28, 2021. FINDINGS: Mediastinal blood pool activity: SUV max 2.2 HEAD/NECK: Focal hypermetabolic activity seen in the right frontal lobe and right basal ganglia. No FDG avid cervical lymph nodes. Incidental CT findings: none CHEST: Hypermetabolic left lower lobe pulmonary nodule measuring 1.4 x 1.4 cm on series 7, image 49 with an SUV max of 5.6, finding is new when compared with Aug 28, 2021 exam. No hypermetabolic lymph nodes seen in the chest. Incidental CT findings: Mild atherosclerotic disease of the thoracic aorta. ABDOMEN/PELVIS: No abnormal hypermetabolic activity within the liver, pancreas, adrenal glands, or spleen. Mildly hypermetabolic subcentimeter right inguinal lymph node measuring mm in short axis on series 4, image 200 with SUV max of 3.6, unchanged in size when compared to prior exam. Incidental CT findings: Splenic calcifications, likely sequela of prior granulomatous infection. Diverticulosis. Atherosclerotic disease of the abdominal aorta. SKELETON: No focal hypermetabolic activity to suggest skeletal metastasis. Incidental CT findings: none EXTREMITIES: No abnormal hypermetabolic activity in the  lower extremities. Incidental CT findings: none IMPRESSION: 1. Focal hypermetabolic activity seen in the right frontal lobe and right basal ganglia, compatible with known CNS lesions. 2. Hypermetabolic left lower lobe pulmonary nodule which is new when compared with Aug 28, 2021 prior and potentially infectious. Recommend short-term follow-up CT in 3 months for further evaluation. 3. Mildly hypermetabolic subcentimeter right inguinal lymph node, potentially reactive. Recommend attention on follow-up. Electronically Signed   By: Yetta Glassman M.D.   On: 09/24/2021 08:40  CT HEAD WO CONTRAST  Result Date: 09/03/2021 CLINICAL DATA:  Brain/CNS neoplasm, assess treatment response EXAM: CT HEAD WITHOUT CONTRAST TECHNIQUE: Contiguous axial images were obtained from the base of the skull through the vertex without intravenous contrast. RADIATION DOSE REDUCTION: This exam was performed according to the departmental dose-optimization program which includes automated exposure control, adjustment of the mA and/or kV according to patient size and/or use of iterative reconstruction technique. COMPARISON:  CT head May 17, 23. MRI head Aug 28, 2021 and Aug 27, 2021. FINDINGS: Brain: Postsurgical changes of right frontal burr hole and biopsy with small volume of intraparenchymal and extra-axial hemorrhage and gas in this region. Redemonstrated multiple masses, largest in the right basal ganglia and better characterized on prior MRI. Similar extensive surrounding vasogenic edema. Mass effect is similar versus slightly increased with 9 mm of leftward midline shift. No evidence of acute large vascular territory infarct or hydrocephalus. Vascular: No hyperdense vessel identified. Skull: Right frontal burr hole. Little bit of overlying gas/edema in the scalp. No acute fracture. Sinuses/Orbits: Clear sinuses. Other: No mastoid effusions. IMPRESSION: 1. Postsurgical changes of right frontal biopsy with small volume of  intraparenchymal and extra-axial hemorrhage and gas in this region. 2. Redemonstrated multiple masses, largest in the right basal ganglia and better characterized on recent MRI. Similar extensive surrounding vasogenic edema. Mass effect is similar versus slightly increased with 9 mm of leftward midline shift. Electronically Signed   By: Margaretha Sheffield M.D.   On: 09/03/2021 09:38     Pathology: SURGICAL PATHOLOGY  CASE: [id number]  PATIENT: [facility]  Surgical Pathology Report   Clinical History: brain tumor (cm)   FINAL MICROSCOPIC DIAGNOSIS:   A. BRAIN TUMOR, RIGHT FRONTAL LOBE, BIOPSY:  -  Diffuse large B-cell lymphoma.  B. BRAIN TUMOR, RIGHT FRONTAL LOBE, BIOPSY:  -  Diffuse large B-cell lymphoma.   Note: The cells of interest consist of large cells with prominent small  nuclear membrane bound nucleoli and apparent scant cytoplasm.  Immunohistochemical stains with appropriate controls were performed on  both parts A and B given the morphologic differences (likely frozen  section artifact).  The cells of interest are positive for CD20 and PAX5  consistent with B-cell origin.  GFAP highlights background brain  parenchyma.  These same cells coexpress MUM1 (greater than 50%) BCL6 and  CD5.  CD10 is essentially negative.  Bcl-2 is equivocal given high  background but interpreted as likely positive.  CD3 highlights  background small T cells.  The proliferation rate by Ki-67 is  approximately 50%.  CD30 is negative.  EBER ISH is negative.  P53 shows  scattered positive cells of uncertain clinical significance.  Overall  the findings are consistent with a diffuse large B-cell lymphoma; if a  systemic work-up is negative for lymphoma, the findings would be  consistent with a primary CNS lymphoma.  The immunophenotype is most  consistent with a post germinal center/activated immunophenotype.  There  is sufficient material present for further FISH/molecular work-up if  clinically  indicated (please call office for add-on testing if  necessary).   Dr. Gari Crown has peer reviewed the case and agrees with the interpretation.   INTRAOPERATIVE DIAGNOSIS:   A. Brain Tumor, Right Frontal Lobe, Biopsy: "High grade astrocytoma."  Intraoperative diagnosis rendered by Dr. Thornell Sartorius at 14:55 on 02 Sep 2021.    Assessment/Plan Primary CNS Lymphoma  10/02/21: -Rituximab today, tolerated well -MTX level 0.53 -Con't leucovorin q6 hours -Keep IVF at 125/hr -D/C tomorrow if MTX <0.10  10/01/21: -Now s/p MTX infusion, tolerated well -Leucovorin started this afternoon -Keep IVF at 125/hr -IV and oral K+ today -Rituximab tmrw -Trend MTX level tmrw  09/30/21: -Methotrexate 3.5gm/m2 today -Keep IVF at 125/hr -Supplemental K+ ordered -Resumed home cytomel -Leucovorin to start tomorrow, rituximab 6/22  ----------------------------------------------------------------------  -Once urine alkalinized and labs reviewed, appropriate to proceed with C1 of high dose MTX with leucovorin rescue inpatient -MTX-R therapy C1D1 hopefully tomorrow 09/30/21: Methotrexate 3.5g/m2 given over 4 hours once urine pH >= 7.0 -Leucovorin 52m q6 hours 24 hours after MTX infusion   -Rituxan can be given 24-48 hours after MTX infusion -To start once electrolytes stabilized: IV fluids, dextrose with 1532m sodium bicarbonate at 12548mr.  Need to adjust rate to urine pH >= 7 -daily labs with cbc, cmp -daily urine pH once it reaches target pH of >7 -daily MTX levels following MTX infusion until MTX levels <0.10 -continue same supportive medications with exception of protonix which interferes with clearance of MTX  -decrease decadron to 4mg59mily -LFTs and renal function both normal at this time   May continue home meds as prior, we will hold vitamins and supplements during the admission.  The total time spent in the encounter was  35 minutes  and more than 50% was on counseling and review of test  results  Please call/text/page with any clinical concerns, updates.  ZachVentura Sellers Medical Director of Neuro-Oncology [facility] AntonioWeslHandley06/23 3:49 PM

## 2021-10-03 ENCOUNTER — Other Ambulatory Visit: Payer: Self-pay

## 2021-10-03 ENCOUNTER — Encounter: Payer: Self-pay | Admitting: Internal Medicine

## 2021-10-03 ENCOUNTER — Encounter (HOSPITAL_COMMUNITY): Payer: Self-pay | Admitting: Internal Medicine

## 2021-10-03 DIAGNOSIS — C8589 Other specified types of non-Hodgkin lymphoma, extranodal and solid organ sites: Secondary | ICD-10-CM | POA: Diagnosis not present

## 2021-10-03 LAB — CBC WITH DIFFERENTIAL/PLATELET
Abs Immature Granulocytes: 0.03 10*3/uL (ref 0.00–0.07)
Basophils Absolute: 0 10*3/uL (ref 0.0–0.1)
Basophils Relative: 0 %
Eosinophils Absolute: 0.1 10*3/uL (ref 0.0–0.5)
Eosinophils Relative: 2 %
HCT: 33.5 % — ABNORMAL LOW (ref 36.0–46.0)
Hemoglobin: 11.4 g/dL — ABNORMAL LOW (ref 12.0–15.0)
Immature Granulocytes: 0 %
Lymphocytes Relative: 15 %
Lymphs Abs: 1.1 10*3/uL (ref 0.7–4.0)
MCH: 32.3 pg (ref 26.0–34.0)
MCHC: 34 g/dL (ref 30.0–36.0)
MCV: 94.9 fL (ref 80.0–100.0)
Monocytes Absolute: 0 10*3/uL — ABNORMAL LOW (ref 0.1–1.0)
Monocytes Relative: 0 %
Neutro Abs: 6 10*3/uL (ref 1.7–7.7)
Neutrophils Relative %: 83 %
Platelets: 139 10*3/uL — ABNORMAL LOW (ref 150–400)
RBC: 3.53 MIL/uL — ABNORMAL LOW (ref 3.87–5.11)
RDW: 14.7 % (ref 11.5–15.5)
WBC: 7.2 10*3/uL (ref 4.0–10.5)
nRBC: 0 % (ref 0.0–0.2)

## 2021-10-03 LAB — COMPREHENSIVE METABOLIC PANEL
ALT: 59 U/L — ABNORMAL HIGH (ref 0–44)
AST: 46 U/L — ABNORMAL HIGH (ref 15–41)
Albumin: 2.6 g/dL — ABNORMAL LOW (ref 3.5–5.0)
Alkaline Phosphatase: 35 U/L — ABNORMAL LOW (ref 38–126)
Anion gap: 3 — ABNORMAL LOW (ref 5–15)
BUN: 14 mg/dL (ref 8–23)
CO2: 34 mmol/L — ABNORMAL HIGH (ref 22–32)
Calcium: 8.3 mg/dL — ABNORMAL LOW (ref 8.9–10.3)
Chloride: 101 mmol/L (ref 98–111)
Creatinine, Ser: 0.65 mg/dL (ref 0.44–1.00)
GFR, Estimated: >60 mL/min (ref >60)
Glucose, Bld: 102 mg/dL — ABNORMAL HIGH (ref 70–99)
Potassium: 3.2 mmol/L — ABNORMAL LOW (ref 3.5–5.1)
Sodium: 138 mmol/L (ref 135–145)
Total Bilirubin: 1.3 mg/dL — ABNORMAL HIGH (ref 0.3–1.2)
Total Protein: 5.2 g/dL — ABNORMAL LOW (ref 6.5–8.1)

## 2021-10-03 LAB — URINALYSIS, ROUTINE W REFLEX MICROSCOPIC
Bacteria, UA: NONE SEEN
Bilirubin Urine: NEGATIVE
Glucose, UA: NEGATIVE mg/dL
Hgb urine dipstick: NEGATIVE
Ketones, ur: NEGATIVE mg/dL
Leukocytes,Ua: NEGATIVE
Nitrite: NEGATIVE
Protein, ur: NEGATIVE mg/dL
Specific Gravity, Urine: 1.008 (ref 1.005–1.030)
pH: 9 — ABNORMAL HIGH (ref 5.0–8.0)

## 2021-10-03 LAB — METHOTREXATE: Methotrexate: 0.09

## 2021-10-03 MED ORDER — DEXAMETHASONE 2 MG PO TABS: 2.0000 mg | ORAL_TABLET | Freq: Every day | ORAL | 0 refills | Status: DC

## 2021-10-03 MED ORDER — HEPARIN SOD (PORK) LOCK FLUSH 100 UNIT/ML IV SOLN
500.0000 [IU] | Freq: Once | INTRAVENOUS | Status: AC
  Administered 2021-10-03: 500 [IU] via INTRAVENOUS
  Filled 2021-10-03: qty 5

## 2021-10-04 ENCOUNTER — Encounter: Payer: Self-pay | Admitting: Adult Health

## 2021-10-06 ENCOUNTER — Encounter: Payer: Self-pay | Admitting: Internal Medicine

## 2021-10-07 ENCOUNTER — Telehealth: Payer: Self-pay | Admitting: *Deleted

## 2021-10-07 MED ORDER — FAMOTIDINE 20 MG PO TABS: 20.0000 mg | ORAL_TABLET | Freq: Every day | ORAL | 0 refills | Status: DC

## 2021-10-09 ENCOUNTER — Encounter: Payer: Self-pay | Admitting: Internal Medicine

## 2021-10-10 DIAGNOSIS — N182 Chronic kidney disease, stage 2 (mild): Secondary | ICD-10-CM | POA: Diagnosis not present

## 2021-10-10 DIAGNOSIS — Z515 Encounter for palliative care: Secondary | ICD-10-CM | POA: Diagnosis not present

## 2021-10-10 DIAGNOSIS — I129 Hypertensive chronic kidney disease with stage 1 through stage 4 chronic kidney disease, or unspecified chronic kidney disease: Secondary | ICD-10-CM | POA: Diagnosis not present

## 2021-10-10 DIAGNOSIS — C719 Malignant neoplasm of brain, unspecified: Secondary | ICD-10-CM | POA: Diagnosis not present

## 2021-10-13 ENCOUNTER — Encounter: Payer: Self-pay | Admitting: Internal Medicine

## 2021-10-16 ENCOUNTER — Encounter: Payer: Self-pay | Admitting: Adult Health

## 2021-10-22 DIAGNOSIS — R269 Unspecified abnormalities of gait and mobility: Secondary | ICD-10-CM | POA: Diagnosis not present

## 2021-10-22 DIAGNOSIS — M6281 Muscle weakness (generalized): Secondary | ICD-10-CM | POA: Diagnosis not present

## 2021-10-24 DIAGNOSIS — Z808 Family history of malignant neoplasm of other organs or systems: Secondary | ICD-10-CM | POA: Diagnosis not present

## 2021-10-24 DIAGNOSIS — C719 Malignant neoplasm of brain, unspecified: Secondary | ICD-10-CM | POA: Diagnosis not present

## 2021-10-24 DIAGNOSIS — L568 Other specified acute skin changes due to ultraviolet radiation: Secondary | ICD-10-CM | POA: Diagnosis not present

## 2021-10-24 DIAGNOSIS — R32 Unspecified urinary incontinence: Secondary | ICD-10-CM | POA: Diagnosis not present

## 2021-10-24 DIAGNOSIS — M545 Low back pain, unspecified: Secondary | ICD-10-CM | POA: Diagnosis not present

## 2021-10-24 DIAGNOSIS — Z79891 Long term (current) use of opiate analgesic: Secondary | ICD-10-CM | POA: Diagnosis not present

## 2021-10-24 DIAGNOSIS — E039 Hypothyroidism, unspecified: Secondary | ICD-10-CM | POA: Diagnosis not present

## 2021-10-24 DIAGNOSIS — R030 Elevated blood-pressure reading, without diagnosis of hypertension: Secondary | ICD-10-CM | POA: Diagnosis not present

## 2021-10-24 DIAGNOSIS — Z7409 Other reduced mobility: Secondary | ICD-10-CM | POA: Diagnosis not present

## 2021-10-27 ENCOUNTER — Encounter (HOSPITAL_COMMUNITY): Payer: Self-pay | Admitting: Internal Medicine

## 2021-10-27 ENCOUNTER — Inpatient Hospital Stay (HOSPITAL_COMMUNITY)
Admission: AD | Admit: 2021-10-27 | Discharge: 2021-10-31 | DRG: 847 | Disposition: A | Discharge status: Home / Self Care | Payer: Medicare PPO | Source: Ambulatory Visit | Attending: Internal Medicine | Admitting: Internal Medicine

## 2021-10-27 DIAGNOSIS — M25562 Pain in left knee: Secondary | ICD-10-CM | POA: Diagnosis present

## 2021-10-27 DIAGNOSIS — C8339 Diffuse large B-cell lymphoma, extranodal and solid organ sites: Secondary | ICD-10-CM | POA: Diagnosis present

## 2021-10-27 DIAGNOSIS — Z79899 Other long term (current) drug therapy: Secondary | ICD-10-CM | POA: Diagnosis not present

## 2021-10-27 DIAGNOSIS — I10 Essential (primary) hypertension: Secondary | ICD-10-CM | POA: Diagnosis present

## 2021-10-27 DIAGNOSIS — Z808 Family history of malignant neoplasm of other organs or systems: Secondary | ICD-10-CM

## 2021-10-27 DIAGNOSIS — Z888 Allergy status to other drugs, medicaments and biological substances status: Secondary | ICD-10-CM

## 2021-10-27 DIAGNOSIS — Z91018 Allergy to other foods: Secondary | ICD-10-CM | POA: Diagnosis not present

## 2021-10-27 DIAGNOSIS — Z8249 Family history of ischemic heart disease and other diseases of the circulatory system: Secondary | ICD-10-CM | POA: Diagnosis not present

## 2021-10-27 DIAGNOSIS — Z9842 Cataract extraction status, left eye: Secondary | ICD-10-CM | POA: Diagnosis not present

## 2021-10-27 DIAGNOSIS — C8589 Other specified types of non-Hodgkin lymphoma, extranodal and solid organ sites: Principal | ICD-10-CM | POA: Diagnosis present

## 2021-10-27 DIAGNOSIS — Z5111 Encounter for antineoplastic chemotherapy: Principal | ICD-10-CM

## 2021-10-27 DIAGNOSIS — Z882 Allergy status to sulfonamides status: Secondary | ICD-10-CM

## 2021-10-27 DIAGNOSIS — E876 Hypokalemia: Secondary | ICD-10-CM | POA: Diagnosis not present

## 2021-10-27 DIAGNOSIS — Z9841 Cataract extraction status, right eye: Secondary | ICD-10-CM

## 2021-10-27 DIAGNOSIS — Z87891 Personal history of nicotine dependence: Secondary | ICD-10-CM

## 2021-10-27 DIAGNOSIS — Z8616 Personal history of COVID-19: Secondary | ICD-10-CM | POA: Diagnosis not present

## 2021-10-27 DIAGNOSIS — E039 Hypothyroidism, unspecified: Secondary | ICD-10-CM | POA: Diagnosis present

## 2021-10-27 LAB — CBC WITH DIFFERENTIAL/PLATELET
Abs Immature Granulocytes: 0.06 10*3/uL (ref 0.00–0.07)
Basophils Absolute: 0.1 10*3/uL (ref 0.0–0.1)
Basophils Relative: 1 %
Eosinophils Absolute: 0.1 10*3/uL (ref 0.0–0.5)
Eosinophils Relative: 1 %
HCT: 35.8 % — ABNORMAL LOW (ref 36.0–46.0)
Hemoglobin: 12.2 g/dL (ref 12.0–15.0)
Immature Granulocytes: 1 %
Lymphocytes Relative: 12 %
Lymphs Abs: 0.9 10*3/uL (ref 0.7–4.0)
MCH: 33.1 pg (ref 26.0–34.0)
MCHC: 34.1 g/dL (ref 30.0–36.0)
MCV: 97 fL (ref 80.0–100.0)
Monocytes Absolute: 0.7 10*3/uL (ref 0.1–1.0)
Monocytes Relative: 10 %
Neutro Abs: 5.4 10*3/uL (ref 1.7–7.7)
Neutrophils Relative %: 75 %
Platelets: 169 10*3/uL (ref 150–400)
RBC: 3.69 MIL/uL — ABNORMAL LOW (ref 3.87–5.11)
RDW: 14.5 % (ref 11.5–15.5)
WBC: 7.2 10*3/uL (ref 4.0–10.5)
nRBC: 0 % (ref 0.0–0.2)

## 2021-10-27 LAB — URINALYSIS, ROUTINE W REFLEX MICROSCOPIC
Bilirubin Urine: NEGATIVE
Glucose, UA: NEGATIVE mg/dL
Hgb urine dipstick: NEGATIVE
Ketones, ur: NEGATIVE mg/dL
Leukocytes,Ua: NEGATIVE
Nitrite: NEGATIVE
Protein, ur: NEGATIVE mg/dL
Specific Gravity, Urine: 1.012 (ref 1.005–1.030)
pH: 8 (ref 5.0–8.0)

## 2021-10-27 LAB — COMPREHENSIVE METABOLIC PANEL
ALT: 26 U/L (ref 0–44)
AST: 26 U/L (ref 15–41)
Albumin: 3.4 g/dL — ABNORMAL LOW (ref 3.5–5.0)
Alkaline Phosphatase: 44 U/L (ref 38–126)
Anion gap: 6 (ref 5–15)
BUN: 23 mg/dL (ref 8–23)
CO2: 27 mmol/L (ref 22–32)
Calcium: 8.9 mg/dL (ref 8.9–10.3)
Chloride: 104 mmol/L (ref 98–111)
Creatinine, Ser: 0.54 mg/dL (ref 0.44–1.00)
GFR, Estimated: >60 mL/min (ref >60)
Glucose, Bld: 103 mg/dL — ABNORMAL HIGH (ref 70–99)
Potassium: 3.6 mmol/L (ref 3.5–5.1)
Sodium: 137 mmol/L (ref 135–145)
Total Bilirubin: 0.8 mg/dL (ref 0.3–1.2)
Total Protein: 6.4 g/dL — ABNORMAL LOW (ref 6.5–8.1)

## 2021-10-27 MED ORDER — SODIUM BICARBONATE 8.4 % IV SOLN
INTRAVENOUS | Status: DC
  Filled 2021-10-27: qty 1000
  Filled 2021-10-27 (×2): qty 150
  Filled 2021-10-27: qty 1000
  Filled 2021-10-27 (×5): qty 150
  Filled 2021-10-27 (×2): qty 1000
  Filled 2021-10-27: qty 150

## 2021-10-27 MED ORDER — SODIUM CHLORIDE 0.9 % IV SOLN: 8.0000 mg | Freq: Three times a day (TID) | INTRAVENOUS | Status: DC | PRN

## 2021-10-27 MED ORDER — GUAIFENESIN-DM 100-10 MG/5ML PO SYRP: 10.0000 mL | ORAL_SOLUTION | ORAL | Status: DC | PRN

## 2021-10-27 MED ORDER — DEXAMETHASONE 4 MG PO TABS
2.0000 mg | ORAL_TABLET | Freq: Every day | ORAL | Status: DC
  Administered 2021-10-28 – 2021-10-31 (×4): 2 mg via ORAL
  Filled 2021-10-27 (×4): qty 1

## 2021-10-27 MED ORDER — MELATONIN 5 MG PO TABS
10.0000 mg | ORAL_TABLET | Freq: Every day | ORAL | Status: DC
  Administered 2021-10-27 – 2021-10-30 (×4): 10 mg via ORAL
  Filled 2021-10-27 (×4): qty 2

## 2021-10-27 MED ORDER — FAMOTIDINE 20 MG PO TABS
20.0000 mg | ORAL_TABLET | Freq: Every day | ORAL | Status: DC
  Administered 2021-10-28 – 2021-10-31 (×4): 20 mg via ORAL
  Filled 2021-10-27 (×4): qty 1

## 2021-10-27 MED ORDER — DEXTROSE 5 % IV SOLN: INTRAVENOUS | Status: DC

## 2021-10-27 MED ORDER — ACETAMINOPHEN 325 MG PO TABS: 650.0000 mg | ORAL_TABLET | Freq: Four times a day (QID) | ORAL | Status: DC | PRN

## 2021-10-27 MED ORDER — SODIUM CHLORIDE 0.9 % IV SOLN
Freq: Once | INTRAVENOUS | Status: AC
  Administered 2021-10-27: 36 mg via INTRAVENOUS
  Filled 2021-10-27: qty 8

## 2021-10-27 MED ORDER — ALUM & MAG HYDROXIDE-SIMETH 200-200-20 MG/5ML PO SUSP
60.0000 mL | ORAL | Status: DC | PRN
  Administered 2021-10-28: 60 mL via ORAL
  Filled 2021-10-27: qty 60

## 2021-10-27 MED ORDER — IRBESARTAN 75 MG PO TABS: 75.0000 mg | ORAL_TABLET | Freq: Every day | ORAL | Status: DC

## 2021-10-27 MED ORDER — METHOTREXATE SODIUM (PF) CHEMO INJECTION 1 GM/40ML
3.5000 g/m2 | Freq: Once | INTRAVENOUS | Status: AC
  Administered 2021-10-27: 6.265 g via INTRAVENOUS
  Filled 2021-10-27: qty 250.6

## 2021-10-27 MED ORDER — ONDANSETRON HCL 4 MG PO TABS: 4.0000 mg | ORAL_TABLET | Freq: Three times a day (TID) | ORAL | Status: DC | PRN

## 2021-10-27 MED ORDER — ACETAMINOPHEN 325 MG PO TABS
650.0000 mg | ORAL_TABLET | ORAL | Status: DC | PRN
  Administered 2021-10-28: 650 mg via ORAL
  Filled 2021-10-27: qty 2

## 2021-10-27 MED ORDER — LIOTHYRONINE SODIUM 5 MCG PO TABS
5.0000 ug | ORAL_TABLET | Freq: Two times a day (BID) | ORAL | Status: DC
  Administered 2021-10-27 – 2021-10-31 (×9): 5 ug via ORAL
  Filled 2021-10-27 (×9): qty 1

## 2021-10-27 MED ORDER — ONDANSETRON 4 MG PO TBDP: 4.0000 mg | ORAL_TABLET | Freq: Three times a day (TID) | ORAL | Status: DC | PRN

## 2021-10-27 MED ORDER — SENNOSIDES-DOCUSATE SODIUM 8.6-50 MG PO TABS: 1.0000 | ORAL_TABLET | Freq: Every evening | ORAL | Status: DC | PRN

## 2021-10-27 MED ORDER — TRAMADOL HCL 50 MG PO TABS
25.0000 mg | ORAL_TABLET | Freq: Every day | ORAL | Status: DC
  Administered 2021-10-27 – 2021-10-30 (×4): 25 mg via ORAL
  Filled 2021-10-27 (×4): qty 1

## 2021-10-27 MED ORDER — ONDANSETRON HCL 4 MG/2ML IJ SOLN: 4.0000 mg | Freq: Three times a day (TID) | INTRAMUSCULAR | Status: DC | PRN

## 2021-10-27 MED ORDER — ENOXAPARIN SODIUM 40 MG/0.4ML IJ SOSY
40.0000 mg | PREFILLED_SYRINGE | INTRAMUSCULAR | Status: DC
  Administered 2021-10-27 – 2021-10-30 (×4): 40 mg via SUBCUTANEOUS
  Filled 2021-10-27 (×4): qty 0.4

## 2021-10-27 MED ORDER — HYDROCORTISONE (PERIANAL) 2.5 % EX CREA: 1.0000 | TOPICAL_CREAM | Freq: Two times a day (BID) | CUTANEOUS | Status: DC | PRN

## 2021-10-27 MED ORDER — IRBESARTAN 75 MG PO TABS
75.0000 mg | ORAL_TABLET | Freq: Every day | ORAL | Status: DC
  Administered 2021-10-28 – 2021-10-31 (×4): 75 mg via ORAL
  Filled 2021-10-27 (×4): qty 1

## 2021-10-27 NOTE — H&P (Signed)
[facility] [place], [place] [address] [phone]   Admission Note  Date of Service: 09/16/21 Patient Name: Daisy Oliver Patient MRN: [id number] Patient DOB: Nov 13, 1935 Provider: [name], MD  Identifying Statement:  Daisy Oliver is a 86 y.o. female with right frontal  CNS lymphoma  who presents for first cycle of high dose methotrexate, rituximab.  Referring Provider: No referring provider defined for this encounter.  Oncologic History: Oncology History  Primary CNS lymphoma ([place])  09/02/2021 Surgery   Stereotactic biopsy by Dr. Marcello Moores; path is B-cell lymphoma CD20+   09/30/2021 -  Chemotherapy   Patient is on Treatment Plan : IP NON-HODGKINS LYMPHOMA High Dose Methotrexate + Leucovorin Rescue     10/02/2021 -  Chemotherapy   Patient is on Treatment Plan : NON-HODGKINS LYMPHOMA Rituximab q21d       Biomarkers:  CD20 positive .  Ki-67 50% .   Unknown   Unknown   History of Present Illness: Daisy Oliver Today she presents for cycle #2 of high dose IV methotrexate.  No new complaints since prior cycle, continues on 7m daily decadron.  Continues to have pain in left knee and leg as prior.  Patient presented to medical attention in May 2023 with several weeks of progressive gait instability.  She describes feeling unsteady on her feet, even falling "a couple of times".  After one of the falls, ED visit led to CNS imaging which demonstrated multifocal masses in the right hemisphere.  She underwent stereotactic biopsy with Dr. TMarcello Mooreson 09/02/21; path demonstrated diffuse large B-cell lymphoma, CD20+.  Since surgery, she has tapered off the steroids without issue.  She feels "fairly normal" aside from pain in her knee and lower back.  She uses a cane to walk, mainly because of the knee pain.  No issues with frank weakness, no seizures.  She lives at home with her husband who has dementia.  [place]    Medications: No current facility-administered medications on file prior to encounter.   Current Outpatient Medications on File Prior to Encounter  Medication Sig Dispense Refill   acetaminophen (TYLENOL) 325 MG tablet Take 650 mg by mouth every 6 (six) hours as needed (for pain). (Patient not taking: Reported on 09/29/2021)     Ascorbic Acid (VITAMIN C PO) Take 1 tablet by mouth 2 (two) times daily.     BIOTIN PO Take 1 tablet by mouth 2 (two) times daily.     dexamethasone (DECADRON) 2 MG tablet Take 1 tablet (2 mg total) by mouth daily. 30 tablet 0   Emollient (CERAVE HEALING) OINT Apply 1 Application topically 2 (two) times daily as needed (wound care/dry skin).     liothyronine (CYTOMEL) 5 MCG tablet Take 2 tablets (10 mcg total) by mouth See admin instructions. Take 10 mcg before breakfast. (Patient taking differently: Take 5 mcg by mouth See admin instructions. Take one tablet (5 mcg) by mouth daily before breakfast and lunch) 180 tablet 3   MAGNESIUM PO Take 1 capsule by mouth 2 (two) times daily.     Melatonin 10 MG TABS Take 10 mg by mouth at bedtime.     Menthol, Topical Analgesic, (BIOFREEZE EX) Apply 1 Application topically 2 (two) times daily as needed (leg pain).     Misc Natural Products (OSTEO BI-FLEX JOINT SHIELD PO) Take 1 tablet by mouth 2 (two) times daily.     Multiple Vitamin (  MULTIVITAMIN WITH MINERALS) TABS tablet Take 1 tablet by mouth 2 (two) times daily.     Multiple Vitamins-Minerals (ZINC PO) Take 1 tablet by mouth 2 (two) times daily.     olmesartan (BENICAR) 20 MG tablet Take 1/2 tablet  Daily  for BP (Patient taking differently: Take 10 mg by mouth every morning.   for BP) 45 tablet 3   Polyethyl Glycol-Propyl Glycol (SYSTANE) 0.4-0.3 % GEL ophthalmic gel Place 1 Application into both eyes at bedtime.     Polyethyl Glycol-Propyl Glycol (SYSTANE) 0.4-0.3 % SOLN Place 1 drop into both eyes every morning.     Pramox-PE-Glycerin-Petrolatum  (PREPARATION H) 1-0.25-14.4-15 % CREA Apply 1 Application topically 2 (two) times daily as needed (hemorrhoid pain).     TAURINE PO Take 1 capsule by mouth 2 (two) times daily.     traMADol (ULTRAM) 50 MG tablet Take 25 mg by mouth at bedtime.     tretinoin (RETIN-A) 0.05 % cream Apply topically at bedtime. (Patient taking differently: Apply 1 application  topically every Monday, Wednesday, and Friday at 8 PM.) 45 g 0   Zinc Oxide 40 % PSTE Apply 1 Application topically 2 (two) times daily as needed (wound care/barrier cream).      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 Herbert Deaner   FRAMELESS  BIOPSY WITH BRAINLAB Right 09/02/2021   Procedure: FRONTAL STEREOTACTIC BRAIN BIOPSY;  Surgeon: Vallarie Mare, MD;  Location: [facility];  Service: Neurosurgery;  Laterality: Right;   IR IMAGING GUIDED PORT INSERTION  09/24/2021   RADIAL HEAD ARTHROPLASTY Right 09/16/2012   Procedure: RADIAL HEAD ARTHROPLASTY;  Surgeon: Johnny Bridge, MD;  Location:  [facility];  Service: Orthopedics;  Laterality: Right;  RADIAL HEAD REPLACEMENT     Social History:  Social History   Socioeconomic History   Marital status: Married    Spouse name: Not on file   Number of children: 2   Years of education: Not on file   Highest education level: Not on file  Occupational History   Not on file  Tobacco Use   Smoking status: Former    Packs/day: 2.00    Years: 22.00    Total pack years: 44.00    Types: Cigarettes    Quit date: 09/17/1983    Years since quitting: 38.1   Smokeless tobacco: Never  Vaping Use   Vaping Use: Never used  Substance and Sexual Activity  Alcohol use: Not Currently    Alcohol/week: 0.0 - 1.0 standard drinks of alcohol    Comment: 2/month   Drug use: No   Sexual activity: Not Currently  Other Topics Concern   Not on file  Social History Narrative   Not on file   Social Determinants of Health   Financial Resource Strain: Not on file  Food Insecurity: Not on file  Transportation Needs: Not on file  Physical Activity: Sufficiently Active (09/14/2017)   Exercise Vital Sign    Days of Exercise per Week: 5 days    Minutes of Exercise per Session: 60 min  Stress: No Stress Concern Present (09/14/2017)   [place]    Feeling of Stress : Only a little  Social Connections: Not on file  Intimate Partner Violence: Not on file   Family History:  Family History  Problem Relation Age of Onset   Heart disease Mother    Brain cancer Father 69       Brain tumor, not cancerous   Hypertension Sister    Osteoporosis Sister    Arthritis Sister    Scoliosis Sister    Hypertension Sister    Osteoporosis Sister    Cancer Paternal Grandfather    Suicidality Paternal Grandfather    Breast cancer Neg Hx     Review of Systems: Constitutional: Doesn't report fevers, chills or abnormal weight loss Eyes: Doesn't report blurriness of vision Ears,  nose, mouth, throat, and face: Doesn't report sore throat Respiratory: Doesn't report cough, dyspnea or wheezes Cardiovascular: Doesn't report palpitation, chest discomfort  Gastrointestinal:  Doesn't report nausea, constipation, diarrhea GU: Doesn't report incontinence Skin: Doesn't report skin rashes Neurological: Per HPI Musculoskeletal: Doesn't report joint pain Behavioral/Psych: Doesn't report anxiety  Physical Exam: Vitals:   10/27/21 0917  BP: 132/72  Pulse: 69  Resp: 16  Temp: 98.4 F (36.9 C)  SpO2: 97%   KPS: 80. General: Alert, cooperative, pleasant, in no acute distress Head: Normal EENT: No conjunctival injection or scleral icterus.  Lungs: Resp effort normal Cardiac: Regular rate Abdomen: Non-distended abdomen Skin: No rashes cyanosis or petechiae. Extremities: No clubbing or edema  Neurologic Exam: Mental Status: Awake, alert, attentive to examiner. Oriented to self and environment. Language is fluent with intact comprehension.  Cranial Nerves: Visual acuity is grossly normal. Visual fields are full. Extra-ocular movements intact. No ptosis. Face is symmetric Motor: Tone and bulk are normal. Power is full in both arms and legs. Reflexes are symmetric, no pathologic reflexes present.  Sensory: Intact to light touch Gait: Orthopedic limitation, cane assisted   Labs: I have reviewed the data as listed    Component Value Date/Time   NA 138 10/03/2021 0507   K 3.2 (L) 10/03/2021 0507   CL 101 10/03/2021 0507   CO2 34 (H) 10/03/2021 0507   GLUCOSE 102 (H) 10/03/2021 0507   BUN 14 10/03/2021 0507   CREATININE 0.65 10/03/2021 0507   CREATININE 0.69 09/23/2021 1611   CALCIUM 8.3 (L) 10/03/2021 0507   PROT 5.2 (L) 10/03/2021 0507   ALBUMIN 2.6 (L) 10/03/2021 0507   AST 46 (H) 10/03/2021 0507   ALT 59 (H) 10/03/2021 0507   ALKPHOS 35 (L) 10/03/2021 0507   BILITOT 1.3 (H) 10/03/2021 0507   GFRNONAA >60 10/03/2021 0507   GFRNONAA 66 09/19/2020 0000   GFRAA  76 09/19/2020 0000   Lab Results  Component Value Date   WBC 7.2 10/03/2021   NEUTROABS 6.0 10/03/2021  HGB 11.4 (L) 10/03/2021   HCT 33.5 (L) 10/03/2021   MCV 94.9 10/03/2021   PLT 139 (L) 10/03/2021    Pathology: SURGICAL PATHOLOGY  CASE: [id number]  PATIENT: Daisy Oliver  Surgical Pathology Report   Clinical History: brain tumor (cm)   FINAL MICROSCOPIC DIAGNOSIS:   A. BRAIN TUMOR, RIGHT FRONTAL LOBE, BIOPSY:  -  Diffuse large B-cell lymphoma.  B. BRAIN TUMOR, RIGHT FRONTAL LOBE, BIOPSY:  -  Diffuse large B-cell lymphoma.   Note: The cells of interest consist of large cells with prominent small  nuclear membrane bound nucleoli and apparent scant cytoplasm.  Immunohistochemical stains with appropriate controls were performed on  both parts A and B given the morphologic differences (likely frozen  section artifact).  The cells of interest are positive for CD20 and PAX5  consistent with B-cell origin.  GFAP highlights background brain  parenchyma.  These same cells coexpress MUM1 (greater than 50%) BCL6 and  CD5.  CD10 is essentially negative.  Bcl-2 is equivocal given high  background but interpreted as likely positive.  CD3 highlights  background small T cells.  The proliferation rate by Ki-67 is  approximately 50%.  CD30 is negative.  EBER ISH is negative.  P53 shows  scattered positive cells of uncertain clinical significance.  Overall  the findings are consistent with a diffuse large B-cell lymphoma; if a  systemic work-up is negative for lymphoma, the findings would be  consistent with a primary CNS lymphoma.  The immunophenotype is most  consistent with a post germinal center/activated immunophenotype.  There  is sufficient material present for further FISH/molecular work-up if  clinically indicated (please call office for add-on testing if  necessary).   Dr. Gari Crown has peer reviewed the case and agrees with the interpretation.   INTRAOPERATIVE DIAGNOSIS:    A. Brain Tumor, Right Frontal Lobe, Biopsy: "High grade astrocytoma."  Intraoperative diagnosis rendered by Dr. Thornell Sartorius at 14:55 on 02 Sep 2021.    Assessment/Plan Primary CNS Lymphoma  -Once urine alkalinized and labs reviewed, appropriate to proceed with C2 of high dose MTX with leucovorin rescue inpatient -MTX-R therapy C2D1 hopefully tomorrow 10/28/21: Methotrexate 3.5g/m2 given over 4 hours once urine pH >= 7.0 -Leucovorin 17m q6 hours 24 hours after MTX infusion   -Rituxan can be given 24-48 hours after MTX infusion -To start once electrolytes stabilized: IV fluids, dextrose with 1556m sodium bicarbonate at 12581mr.  Need to adjust rate to urine pH >= 7 -daily labs with cbc, cmp -daily urine pH once it reaches target pH of >7 -daily MTX levels following MTX infusion until MTX levels <0.10 -continue same supportive medications with exception of protonix which interferes with clearance of MTX  -con't decadron 2mg67mily -LFTs and renal function both normal at this time   May continue home meds as prior, we will hold vitamins and supplements during the admission.  The total time spent in the encounter was 60 minutes and more than 50% was on counseling and review of test results  Please call/text/page with any clinical concerns, updates.  ZachVentura Sellers Medical Director of Neuro-Oncology [facility] IncWeslFort Jesup06/23 9:56 AM

## 2021-10-28 ENCOUNTER — Other Ambulatory Visit: Payer: Self-pay

## 2021-10-28 DIAGNOSIS — C8589 Other specified types of non-Hodgkin lymphoma, extranodal and solid organ sites: Secondary | ICD-10-CM | POA: Diagnosis not present

## 2021-10-28 LAB — COMPREHENSIVE METABOLIC PANEL
ALT: 33 U/L (ref 0–44)
AST: 31 U/L (ref 15–41)
Albumin: 2.9 g/dL — ABNORMAL LOW (ref 3.5–5.0)
Alkaline Phosphatase: 38 U/L (ref 38–126)
Anion gap: 7 (ref 5–15)
BUN: 22 mg/dL (ref 8–23)
CO2: 33 mmol/L — ABNORMAL HIGH (ref 22–32)
Calcium: 8.8 mg/dL — ABNORMAL LOW (ref 8.9–10.3)
Chloride: 100 mmol/L (ref 98–111)
Creatinine, Ser: 0.62 mg/dL (ref 0.44–1.00)
GFR, Estimated: >60 mL/min (ref >60)
Glucose, Bld: 126 mg/dL — ABNORMAL HIGH (ref 70–99)
Potassium: 3.2 mmol/L — ABNORMAL LOW (ref 3.5–5.1)
Sodium: 140 mmol/L (ref 135–145)
Total Bilirubin: 1.9 mg/dL — ABNORMAL HIGH (ref 0.3–1.2)
Total Protein: 5.6 g/dL — ABNORMAL LOW (ref 6.5–8.1)

## 2021-10-28 LAB — CBC WITH DIFFERENTIAL/PLATELET
Abs Immature Granulocytes: 0.06 10*3/uL (ref 0.00–0.07)
Basophils Absolute: 0 10*3/uL (ref 0.0–0.1)
Basophils Relative: 0 %
Eosinophils Absolute: 0 10*3/uL (ref 0.0–0.5)
Eosinophils Relative: 0 %
HCT: 34 % — ABNORMAL LOW (ref 36.0–46.0)
Hemoglobin: 11.6 g/dL — ABNORMAL LOW (ref 12.0–15.0)
Immature Granulocytes: 1 %
Lymphocytes Relative: 5 %
Lymphs Abs: 0.6 10*3/uL — ABNORMAL LOW (ref 0.7–4.0)
MCH: 32.6 pg (ref 26.0–34.0)
MCHC: 34.1 g/dL (ref 30.0–36.0)
MCV: 95.5 fL (ref 80.0–100.0)
Monocytes Absolute: 0.7 10*3/uL (ref 0.1–1.0)
Monocytes Relative: 6 %
Neutro Abs: 10.7 10*3/uL — ABNORMAL HIGH (ref 1.7–7.7)
Neutrophils Relative %: 88 %
Platelets: 167 10*3/uL (ref 150–400)
RBC: 3.56 MIL/uL — ABNORMAL LOW (ref 3.87–5.11)
RDW: 13.6 % (ref 11.5–15.5)
WBC: 12.1 10*3/uL — ABNORMAL HIGH (ref 4.0–10.5)
nRBC: 0 % (ref 0.0–0.2)

## 2021-10-28 LAB — URINALYSIS, ROUTINE W REFLEX MICROSCOPIC
Bacteria, UA: NONE SEEN
Bilirubin Urine: NEGATIVE
Glucose, UA: NEGATIVE mg/dL
Hgb urine dipstick: NEGATIVE
Ketones, ur: NEGATIVE mg/dL
Leukocytes,Ua: NEGATIVE
Nitrite: NEGATIVE
Protein, ur: 30 mg/dL — AB
Specific Gravity, Urine: 1.01 (ref 1.005–1.030)
pH: 7 (ref 5.0–8.0)

## 2021-10-28 MED ORDER — SODIUM CHLORIDE 0.9 % IV SOLN
Freq: Once | INTRAVENOUS | Status: AC
  Administered 2021-10-28: 16 mg via INTRAVENOUS
  Filled 2021-10-28: qty 8

## 2021-10-28 MED ORDER — LEUCOVORIN CALCIUM INJECTION 100 MG
15.0000 mg | Freq: Four times a day (QID) | INTRAMUSCULAR | Status: AC
  Administered 2021-10-28 – 2021-10-29 (×4): 16 mg via INTRAVENOUS
  Filled 2021-10-28 (×8): qty 0.8

## 2021-10-28 MED ORDER — CHLORHEXIDINE GLUCONATE CLOTH 2 % EX PADS
6.0000 | MEDICATED_PAD | Freq: Every day | CUTANEOUS | Status: DC
  Administered 2021-10-28 – 2021-10-31 (×4): 6 via TOPICAL

## 2021-10-28 MED ORDER — POTASSIUM CHLORIDE 10 MEQ/100ML IV SOLN
10.0000 meq | INTRAVENOUS | Status: AC
  Administered 2021-10-28 (×4): 10 meq via INTRAVENOUS
  Filled 2021-10-28 (×4): qty 100

## 2021-10-28 NOTE — Plan of Care (Signed)

## 2021-10-28 NOTE — Progress Notes (Signed)
[facility] [place] at [address] [place], [place] [address] [phone]   Inpatient Note  Date of Service: 09/16/21 Patient Name: Daisy Oliver Patient MRN: [id number] Patient DOB: [birth year] Provider: [name], MD  Identifying Statement:  Daisy Oliver is a 86 y.o. female with right frontal  CNS lymphoma  who presents for first cycle of high dose methotrexate, rituximab.  Referring Provider: [name], MD Daisy Oliver [id number]  Oncologic History: Oncology History  Primary CNS lymphoma [facility])  09/02/2021 Surgery   Stereotactic biopsy by Dr. Marcello Oliver; path is B-cell lymphoma CD20+   09/30/2021 -  Chemotherapy   Patient is on Treatment Plan : IP NON-HODGKINS LYMPHOMA High Dose Methotrexate + Leucovorin Rescue     10/02/2021 -  Chemotherapy   Patient is on Treatment Plan : NON-HODGKINS LYMPHOMA Rituximab q21d       Biomarkers:  CD20 positive .  Ki-67 50% .   Unknown   Unknown   History of Present Illness: Daisy Oliver is feeling well after the methotrexate, no new complaints today.  Will plan for leucovorin this afternoon.    Patient presented to medical attention in May 2023 with several weeks of progressive gait instability.  She describes feeling unsteady on her feet, even falling "a couple of times".  After one of the falls, ED visit led to CNS imaging which demonstrated multifocal masses in the right hemisphere.  She underwent stereotactic biopsy with Dr. Marcello Oliver on 09/02/21; path demonstrated diffuse large B-cell lymphoma, CD20+.  Since surgery, she has tapered off the steroids without issue.  She feels "fairly normal" aside from pain in her knee and lower back.  She uses a cane to walk, mainly because of the knee pain.  No issues with frank weakness, no seizures.  She lives at home with her husband who has dementia.  Daughter is nearby in [place].    Medications: No current  facility-administered medications on file prior to encounter.   Current Outpatient Medications on File Prior to Encounter  Medication Sig Dispense Refill   acetaminophen (TYLENOL) 500 MG tablet Take 1,000 mg by mouth every 8 (eight) hours as needed (pain).     Ascorbic Acid (VITAMIN C PO) Take 1 tablet by mouth 2 (two) times daily.     BIOTIN PO Take 1 tablet by mouth 2 (two) times daily.     dexamethasone (DECADRON) 2 MG tablet Take 1 tablet (2 mg total) by mouth daily. (Patient taking differently: Take 1 mg by mouth 2 (two) times daily.) 30 tablet 0   Emollient (CERAVE HEALING) OINT Apply 1 Application topically 2 (two) times daily as needed (wound care/dry skin).     Famotidine (PEPCID PO) Take 20 mg by mouth at bedtime as needed (acid reflux/heartburn).     liothyronine (CYTOMEL) 5 MCG tablet Take 2 tablets (10 mcg total) by mouth See admin instructions. Take 10 mcg before breakfast. (Patient taking differently: Take 10 mcg by mouth See admin instructions. Take two tablets (10 mcg) by mouth daily before breakfast and lunch) 180 tablet 3   MAGNESIUM PO Take 1 capsule by mouth 2 (two) times daily.     Melatonin 10 MG TABS Take 10 mg by mouth at bedtime.     Menthol, Topical Analgesic, (BIOFREEZE EX) Apply 1 Application topically 2 (two) times daily as needed (leg pain).     Misc Natural Products (OSTEO BI-FLEX JOINT SHIELD PO) Take 1 tablet  by mouth 2 (two) times daily.     Multiple Vitamin (MULTIVITAMIN WITH MINERALS) TABS tablet Take 1 tablet by mouth 2 (two) times daily.     Multiple Vitamins-Minerals (ZINC PO) Take 1 tablet by mouth 2 (two) times daily.     nystatin ointment (MYCOSTATIN) Apply 1 Application topically every morning.     olmesartan (BENICAR) 20 MG tablet Take 1/2 tablet  Daily  for BP (Patient taking differently: Take 10 mg by mouth every morning.   for BP) 45 tablet 3   Polyethyl Glycol-Propyl Glycol (SYSTANE) 0.4-0.3 % GEL ophthalmic gel Place 1 Application into both eyes at  bedtime.     Polyethyl Glycol-Propyl Glycol (SYSTANE) 0.4-0.3 % SOLN Place 1 drop into both eyes every morning.     Pramox-PE-Glycerin-Petrolatum (PREPARATION H) 1-0.25-14.4-15 % CREA Apply 1 Application topically 2 (two) times daily as needed (hemorrhoid pain).     TAURINE PO Take 1 capsule by mouth 2 (two) times daily.     tretinoin (RETIN-A) 0.05 % cream Apply topically at bedtime. (Patient taking differently: Apply 1 application  topically every Monday, Wednesday, and Friday at 8 PM.) 45 g 0   Zinc Oxide 40 % PSTE Apply 1 Application topically 2 (two) times daily as needed (wound care/barrier cream).     acetaminophen (TYLENOL) 325 MG tablet Take 650 mg by mouth every 6 (six) hours as needed (for pain). (Patient not taking: Reported on 09/29/2021)     traMADol (ULTRAM) 50 MG tablet Take 25 mg by mouth at bedtime. 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 Herbert Deaner   FRAMELESS  BIOPSY WITH  BRAINLAB Right 09/02/2021   Procedure: FRONTAL STEREOTACTIC BRAIN BIOPSY;  Surgeon: Vallarie Mare, MD;  Location: [place];  Service: Neurosurgery;  Laterality: Right;   IR IMAGING GUIDED PORT INSERTION  09/24/2021   RADIAL HEAD ARTHROPLASTY Right 09/16/2012   Procedure: RADIAL HEAD ARTHROPLASTY;  Surgeon: Johnny Bridge, MD;  Location: [facility];  Service: Orthopedics;  Laterality: Right;  RADIAL HEAD REPLACEMENT     Social History:  Social History   Socioeconomic History   Marital status: Married    Spouse name: Not on file   Number of children: 2   Years of education: Not on file   Highest education level: Not on file  Occupational History   Not on file  Tobacco Use  Smoking status: Former    Packs/day: 2.00    Years: 22.00    Total pack years: 44.00    Types: Cigarettes    Quit date: 09/17/1983    Years since quitting: 38.1   Smokeless tobacco: Never  Vaping Use   Vaping Use: Never used  Substance and Sexual Activity   Alcohol use: Not Currently    Alcohol/week: 0.0 - 1.0 standard drinks of alcohol    Comment: 2/month   Drug use: No   Sexual activity: Not Currently  Other Topics Concern   Not on file  Social History Narrative   Not on file   Social Determinants of Health   Financial Resource Strain: Not on file  Food Insecurity: Not on file  Transportation Needs: Not on file  Physical Activity: Sufficiently Active (09/14/2017)   Exercise Vital Sign    Days of Exercise per Week: 5 days    Minutes of Exercise per Session: 60 min  Stress: No Stress Concern Present (09/14/2017)   [place]    Feeling of Stress : Only a little  Social Connections: Not on file  Intimate Partner Violence: Not on file   Family History:  Family History  Problem Relation Age of Onset   Heart disease Mother    Brain cancer Father 84       Brain tumor, not cancerous   Hypertension Sister    Osteoporosis  Sister    Arthritis Sister    Scoliosis Sister    Hypertension Sister    Osteoporosis Sister    Cancer Paternal Grandfather    Suicidality Paternal Grandfather    Breast cancer Neg Hx     Review of Systems: Constitutional: Doesn't report fevers, chills or abnormal weight loss Eyes: Doesn't report blurriness of vision Ears, nose, mouth, throat, and face: Doesn't report sore throat Respiratory: Doesn't report cough, dyspnea or wheezes Cardiovascular: Doesn't report palpitation, chest discomfort  Gastrointestinal:  Doesn't report nausea, constipation, diarrhea GU: Doesn't report incontinence Skin: Doesn't report skin rashes Neurological: Per HPI Musculoskeletal: Doesn't report joint pain Behavioral/Psych: Doesn't report anxiety  Physical Exam: Vitals:   10/27/21 2120 10/28/21 0628  BP: (!) 125/54 (!) 127/59  Pulse: (!) 44 (!) 41  Resp: 18 16  Temp: 97.7 F (36.5 C) 97.7 F (36.5 C)  SpO2: 94% 94%   KPS: 80. General: Alert, cooperative, pleasant, in no acute distress Head: Normal EENT: No conjunctival injection or scleral icterus.  Lungs: Resp effort normal Cardiac: Regular rate Abdomen: Non-distended abdomen Skin: No rashes cyanosis or petechiae. Extremities: No clubbing or edema  Neurologic Exam: Mental Status: Awake, alert, attentive to examiner. Oriented to self and environment. Language is fluent with intact comprehension.  Cranial Nerves: Visual acuity is grossly normal. Visual fields are full. Extra-ocular movements intact. No ptosis. Face is symmetric Motor: Tone and bulk are normal. Power is full in both arms and legs. Reflexes are symmetric, no pathologic reflexes present.  Sensory: Intact to light touch Gait: Orthopedic limitation, cane assisted   Labs: I have reviewed the data as listed    Component Value Date/Time   NA 140 10/28/2021 0414   K 3.2 (L) 10/28/2021 0414   CL 100 10/28/2021 0414   CO2 33 (H) 10/28/2021 0414   GLUCOSE 126 (H) 10/28/2021  0414   BUN 22 10/28/2021 0414   CREATININE 0.62 10/28/2021 0414   CREATININE 0.69 09/23/2021 1611   CALCIUM 8.8 (L) 10/28/2021 0414   PROT 5.6 (L)  10/28/2021 0414   ALBUMIN 2.9 (L) 10/28/2021 0414   AST 31 10/28/2021 0414   ALT 33 10/28/2021 0414   ALKPHOS 38 10/28/2021 0414   BILITOT 1.9 (H) 10/28/2021 0414   GFRNONAA >60 10/28/2021 0414   GFRNONAA 66 09/19/2020 0000   GFRAA 76 09/19/2020 0000   Lab Results  Component Value Date   WBC 12.1 (H) 10/28/2021   NEUTROABS 10.7 (H) 10/28/2021   HGB 11.6 (L) 10/28/2021   HCT 34.0 (L) 10/28/2021   MCV 95.5 10/28/2021   PLT 167 10/28/2021    Pathology: SURGICAL PATHOLOGY  CASE: [id number]  PATIENT: Fernando Amey  Surgical Pathology Report   Clinical History: brain tumor (cm)   FINAL MICROSCOPIC DIAGNOSIS:   A. BRAIN TUMOR, RIGHT FRONTAL LOBE, BIOPSY:  -  Diffuse large B-cell lymphoma.  B. BRAIN TUMOR, RIGHT FRONTAL LOBE, BIOPSY:  -  Diffuse large B-cell lymphoma.   Note: The cells of interest consist of large cells with prominent small  nuclear membrane bound nucleoli and apparent scant cytoplasm.  Immunohistochemical stains with appropriate controls were performed on  both parts A and B given the morphologic differences (likely frozen  section artifact).  The cells of interest are positive for CD20 and PAX5  consistent with B-cell origin.  GFAP highlights background brain  parenchyma.  These same cells coexpress MUM1 (greater than 50%) BCL6 and  CD5.  CD10 is essentially negative.  Bcl-2 is equivocal given high  background but interpreted as likely positive.  CD3 highlights  background small T cells.  The proliferation rate by Ki-67 is  approximately 50%.  CD30 is negative.  EBER ISH is negative.  P53 shows  scattered positive cells of uncertain clinical significance.  Overall  the findings are consistent with a diffuse large B-cell lymphoma; if a  systemic work-up is negative for lymphoma, the findings would be   consistent with a primary CNS lymphoma.  The immunophenotype is most  consistent with a post germinal center/activated immunophenotype.  There  is sufficient material present for further FISH/molecular work-up if  clinically indicated (please call office for add-on testing if  necessary).   Dr. Gari Crown has peer reviewed the case and agrees with the interpretation.   INTRAOPERATIVE DIAGNOSIS:   A. Brain Tumor, Right Frontal Lobe, Biopsy: "High grade astrocytoma."  Intraoperative diagnosis rendered by Dr. Thornell Sartorius at 14:55 on 02 Sep 2021.    Assessment/Plan Primary CNS Lymphoma  10/28/21 -Increase IVF to 150/hr given urine ph 7 today -Replete K+ IV 41mq q1 hour x4 -Begin leucovorin today once 24h s/p MTX infusion -Plan for first MTX level tomorrow AM -Rituximab infusion for 7/19  --------------------------------- -Once urine alkalinized and labs reviewed, appropriate to proceed with C2 of high dose MTX with leucovorin rescue inpatient -MTX-R therapy C2D1 hopefully tomorrow 10/28/21: Methotrexate 3.5g/m2 given over 4 hours once urine pH >= 7.0 -Leucovorin 142mq6 hours 24 hours after MTX infusion   -Rituxan can be given 24-48 hours after MTX infusion -To start once electrolytes stabilized: IV fluids, dextrose with 15046msodium bicarbonate at 125m65m.  Need to adjust rate to urine pH >= 7 -daily labs with cbc, cmp -daily urine pH once it reaches target pH of >7 -daily MTX levels following MTX infusion until MTX levels <0.10 -continue same supportive medications with exception of protonix which interferes with clearance of MTX  -con't decadron 2mg 32mly -LFTs and renal function both normal at this time   May continue home meds as prior, we will hold vitamins and  supplements during the admission.  The total time spent in the encounter was  35 minutes  and more than 50% was on counseling and review of test results  Please call/text/page with any clinical concerns,  updates.  Daisy Sellers, MD Medical Director of Neuro-Oncology [facility] [place] 09/16/21 11:21 AM

## 2021-10-29 DIAGNOSIS — C8589 Other specified types of non-Hodgkin lymphoma, extranodal and solid organ sites: Secondary | ICD-10-CM | POA: Diagnosis not present

## 2021-10-29 LAB — CBC WITH DIFFERENTIAL/PLATELET
Abs Immature Granulocytes: 0.05 10*3/uL (ref 0.00–0.07)
Basophils Absolute: 0 10*3/uL (ref 0.0–0.1)
Basophils Relative: 0 %
Eosinophils Absolute: 0 10*3/uL (ref 0.0–0.5)
Eosinophils Relative: 0 %
HCT: 35.5 % — ABNORMAL LOW (ref 36.0–46.0)
Hemoglobin: 12.2 g/dL (ref 12.0–15.0)
Immature Granulocytes: 1 %
Lymphocytes Relative: 6 %
Lymphs Abs: 0.4 10*3/uL — ABNORMAL LOW (ref 0.7–4.0)
MCH: 32.8 pg (ref 26.0–34.0)
MCHC: 34.4 g/dL (ref 30.0–36.0)
MCV: 95.4 fL (ref 80.0–100.0)
Monocytes Absolute: 0.5 10*3/uL (ref 0.1–1.0)
Monocytes Relative: 6 %
Neutro Abs: 6.8 10*3/uL (ref 1.7–7.7)
Neutrophils Relative %: 87 %
Platelets: 161 10*3/uL (ref 150–400)
RBC: 3.72 MIL/uL — ABNORMAL LOW (ref 3.87–5.11)
RDW: 13.9 % (ref 11.5–15.5)
WBC: 7.7 10*3/uL (ref 4.0–10.5)
nRBC: 0 % (ref 0.0–0.2)

## 2021-10-29 LAB — URINALYSIS, ROUTINE W REFLEX MICROSCOPIC
Bacteria, UA: NONE SEEN
Bilirubin Urine: NEGATIVE
Glucose, UA: NEGATIVE mg/dL
Hgb urine dipstick: NEGATIVE
Ketones, ur: NEGATIVE mg/dL
Leukocytes,Ua: NEGATIVE
Nitrite: NEGATIVE
Protein, ur: NEGATIVE mg/dL
Specific Gravity, Urine: 1.009 (ref 1.005–1.030)
pH: 9 — ABNORMAL HIGH (ref 5.0–8.0)

## 2021-10-29 LAB — COMPREHENSIVE METABOLIC PANEL
ALT: 34 U/L (ref 0–44)
AST: 28 U/L (ref 15–41)
Albumin: 2.9 g/dL — ABNORMAL LOW (ref 3.5–5.0)
Alkaline Phosphatase: 39 U/L (ref 38–126)
Anion gap: 8 (ref 5–15)
BUN: 24 mg/dL — ABNORMAL HIGH (ref 8–23)
CO2: 37 mmol/L — ABNORMAL HIGH (ref 22–32)
Calcium: 8.5 mg/dL — ABNORMAL LOW (ref 8.9–10.3)
Chloride: 96 mmol/L — ABNORMAL LOW (ref 98–111)
Creatinine, Ser: 0.82 mg/dL (ref 0.44–1.00)
GFR, Estimated: >60 mL/min (ref >60)
Glucose, Bld: 156 mg/dL — ABNORMAL HIGH (ref 70–99)
Potassium: 3.5 mmol/L (ref 3.5–5.1)
Sodium: 141 mmol/L (ref 135–145)
Total Bilirubin: 0.8 mg/dL (ref 0.3–1.2)
Total Protein: 5.3 g/dL — ABNORMAL LOW (ref 6.5–8.1)

## 2021-10-29 LAB — METHOTREXATE: Methotrexate: 1.53

## 2021-10-29 MED ORDER — SODIUM CHLORIDE 0.9 % IV SOLN: Freq: Once | INTRAVENOUS | Status: AC

## 2021-10-29 MED ORDER — SODIUM CHLORIDE 0.9 % IV SOLN
Freq: Once | INTRAVENOUS | Status: AC
  Administered 2021-10-29: 16 mg via INTRAVENOUS
  Filled 2021-10-29: qty 8

## 2021-10-29 MED ORDER — ACETAMINOPHEN 325 MG PO TABS
650.0000 mg | ORAL_TABLET | Freq: Once | ORAL | Status: AC
  Administered 2021-10-29: 650 mg via ORAL
  Filled 2021-10-29: qty 2

## 2021-10-29 MED ORDER — LEUCOVORIN CALCIUM INJECTION 100 MG
15.0000 mg | Freq: Four times a day (QID) | INTRAMUSCULAR | Status: AC
  Administered 2021-10-29 – 2021-10-30 (×4): 16 mg via INTRAVENOUS
  Filled 2021-10-29 (×9): qty 0.8

## 2021-10-29 MED ORDER — DIPHENHYDRAMINE HCL 25 MG PO CAPS
50.0000 mg | ORAL_CAPSULE | Freq: Once | ORAL | Status: AC
  Administered 2021-10-29: 50 mg via ORAL
  Filled 2021-10-29: qty 2

## 2021-10-29 MED ORDER — SODIUM CHLORIDE 0.9 % IV SOLN
375.0000 mg/m2 | Freq: Once | INTRAVENOUS | Status: AC
  Administered 2021-10-29: 700 mg via INTRAVENOUS
  Filled 2021-10-29: qty 50

## 2021-10-29 NOTE — Progress Notes (Signed)
  Transition of Care [facility]) Screening Note   Patient Details  Name: Daisy Oliver Date of Birth: 04-29-1935   Transition of Care [facility]) CM/SW Contact:    Vassie Moselle, LCSW Phone Number: 10/29/2021, 10:50 AM    Transition of Care Department [facility]) has reviewed patient and no TOC needs have been identified at this time. We will continue to monitor patient advancement through interdisciplinary progression rounds. If new patient transition needs arise, please place a TOC consult.

## 2021-10-29 NOTE — Progress Notes (Signed)
Rapid Infusion Rituximab Pharmacist Evaluation  Daisy Oliver is a 86 y.o. female being treated with rituximab for CNS Lymphoma. This patient may be considered for RIR.   A pharmacist has verified the patient tolerated rituximab infusions per the [facility] standard infusion protocol without grade 3-4 infusion reactions. The treatment plan will be updated to reflect RIR if the patient qualifies per the checklist below:   Age > 70 years old Yes   Clinically significant cardiovascular disease No   Circulating lymphocyte count < 5000/uL prior to cycle two Yes  Lab Results  Component Value Date   LYMPHSABS 0.4 (L) 10/29/2021    Prior documented grade 3-4 infusion reaction to rituximab No   Prior documented grade 1-2 infusion reaction to rituximab (If YES, Pharmacist will confirm with Physician if patient is still a candidate for RIR) No   Previous rituximab infusion within the past 6 months Yes   Treatment Plan updated orders to reflect RIR Yes    Daisy Oliver does meet the criteria for Rapid Infusion Rituximab. This patient is going to be switched to rapid infusion rituximab. Discussed w/ Dr. Mickeal Skinner and he is in agreement.  Kennith Center, Pharm.D., CPP 10/29/2021'@9'$ :52 AM

## 2021-10-29 NOTE — Progress Notes (Signed)
[facility] [place], [place] [address] [phone]   Inpatient Note  Date of Service: 09/16/21 Patient Name: Daisy Oliver Patient MRN: [id number] Patient DOB: Sep 11, 1935 Provider: [name], MD  Identifying Statement:  Daisy Oliver is a 86 y.o. female with right frontal  CNS lymphoma  who presents for first cycle of high dose methotrexate, rituximab.  Referring Provider: [name], MD [place],  [place] [address]  Oncologic History: Oncology History  Primary CNS lymphoma [facility])  09/02/2021 Surgery   Stereotactic biopsy by Dr. Marcello Moores; path is B-cell lymphoma CD20+   09/30/2021 -  Chemotherapy   Patient is on Treatment Plan : IP NON-HODGKINS LYMPHOMA High Dose Methotrexate + Leucovorin Rescue     10/02/2021 -  Chemotherapy   Patient is on Treatment Plan : NON-HODGKINS LYMPHOMA Rituximab q21d       Biomarkers:  CD20 positive .  Ki-67 50% .   Unknown   Unknown   History of Present Illness: Daisy Oliver is feeling well, now having started the leucovorin.  No new complaints today.  Will plan for rituximab this afternoon.    Patient presented to medical attention in May 2023 with several weeks of progressive gait instability.  She describes feeling unsteady on her feet, even falling "a couple of times".  After one of the falls, ED visit led to CNS imaging which demonstrated multifocal masses in the right hemisphere.  She underwent stereotactic biopsy with Dr. Marcello Moores on 09/02/21; path demonstrated diffuse large B-cell lymphoma, CD20+.  Since surgery, she has tapered off the steroids without issue.  She feels "fairly normal" aside from pain in her knee and lower back.  She uses a cane to walk, mainly because of the knee pain.  No issues with frank weakness, no seizures.  She lives at home with her husband who has dementia.  Daughter is nearby in [place].    Medications: No  current facility-administered medications on file prior to encounter.   Current Outpatient Medications on File Prior to Encounter  Medication Sig Dispense Refill   acetaminophen (TYLENOL) 500 MG tablet Take 1,000 mg by mouth every 8 (eight) hours as needed (pain).     Ascorbic Acid (VITAMIN C PO) Take 1 tablet by mouth 2 (two) times daily.     BIOTIN PO Take 1 tablet by mouth 2 (two) times daily.     dexamethasone (DECADRON) 2 MG tablet Take 1 tablet (2 mg total) by mouth daily. (Patient taking differently: Take 1 mg by mouth 2 (two) times daily.) 30 tablet 0   Emollient (CERAVE HEALING) OINT Apply 1 Application topically 2 (two) times daily as needed (wound care/dry skin).     Famotidine (PEPCID PO) Take 20 mg by mouth at bedtime as needed (acid reflux/heartburn).     liothyronine (CYTOMEL) 5 MCG tablet Take 2 tablets (10 mcg total) by mouth See admin instructions. Take 10 mcg before breakfast. (Patient taking differently: Take 10 mcg by mouth See admin instructions. Take two tablets (10 mcg) by mouth daily before breakfast and lunch) 180 tablet 3   MAGNESIUM PO Take 1 capsule by mouth 2 (two) times daily.     Melatonin 10 MG TABS Take 10 mg by mouth at bedtime.     Menthol, Topical Analgesic, (BIOFREEZE EX) Apply 1 Application topically 2 (two) times daily as needed (leg pain).     Misc Natural Products (OSTEO BI-FLEX JOINT SHIELD PO)  Take 1 tablet by mouth 2 (two) times daily.     Multiple Vitamin (MULTIVITAMIN WITH MINERALS) TABS tablet Take 1 tablet by mouth 2 (two) times daily.     Multiple Vitamins-Minerals (ZINC PO) Take 1 tablet by mouth 2 (two) times daily.     nystatin ointment (MYCOSTATIN) Apply 1 Application topically every morning.     olmesartan (BENICAR) 20 MG tablet Take 1/2 tablet  Daily  for BP (Patient taking differently: Take 10 mg by mouth every morning.   for BP) 45 tablet 3   Polyethyl Glycol-Propyl Glycol (SYSTANE) 0.4-0.3 % GEL ophthalmic gel Place 1 Application into both  eyes at bedtime.     Polyethyl Glycol-Propyl Glycol (SYSTANE) 0.4-0.3 % SOLN Place 1 drop into both eyes every morning.     Pramox-PE-Glycerin-Petrolatum (PREPARATION H) 1-0.25-14.4-15 % CREA Apply 1 Application topically 2 (two) times daily as needed (hemorrhoid pain).     TAURINE PO Take 1 capsule by mouth 2 (two) times daily.     tretinoin (RETIN-A) 0.05 % cream Apply topically at bedtime. (Patient taking differently: Apply 1 application  topically every Monday, Wednesday, and Friday at 8 PM.) 45 g 0   Zinc Oxide 40 % PSTE Apply 1 Application topically 2 (two) times daily as needed (wound care/barrier cream).     acetaminophen (TYLENOL) 325 MG tablet Take 650 mg by mouth every 6 (six) hours as needed (for pain). (Patient not taking: Reported on 09/29/2021)     traMADol (ULTRAM) 50 MG tablet Take 25 mg by mouth at bedtime. 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 Herbert Deaner   FRAMELESS  BIOPSY  WITH BRAINLAB Right 09/02/2021   Procedure: FRONTAL STEREOTACTIC BRAIN BIOPSY;  Surgeon: Vallarie Mare, MD;  Location: [facility];  Service: Neurosurgery;  Laterality: Right;   IR IMAGING GUIDED PORT INSERTION  09/24/2021   RADIAL HEAD ARTHROPLASTY Right 09/16/2012   Procedure: RADIAL HEAD ARTHROPLASTY;  Surgeon: Johnny Bridge, MD;  Location: [facility];  Service: Orthopedics;  Laterality: Right;  RADIAL HEAD REPLACEMENT     Social History:  Social History   Socioeconomic History   Marital status: Married    Spouse name: Not on file   Number of children: 2   Years of education: Not on file   Highest education level: Not on file  Occupational History   Not on file  Tobacco  Use   Smoking status: Former    Packs/day: 2.00    Years: 22.00    Total pack years: 44.00    Types: Cigarettes    Quit date: 09/17/1983    Years since quitting: 38.1   Smokeless tobacco: Never  Vaping Use   Vaping Use: Never used  Substance and Sexual Activity   Alcohol use: Not Currently    Alcohol/week: 0.0 - 1.0 standard drinks of alcohol    Comment: 2/month   Drug use: No   Sexual activity: Not Currently  Other Topics Concern   Not on file  Social History Narrative   Not on file   Social Determinants of Health   Financial Resource Strain: Not on file  Food Insecurity: Not on file  Transportation Needs: Not on file  Physical Activity: Sufficiently Active (09/14/2017)   Exercise Vital Sign    Days of Exercise per Week: 5 days    Minutes of Exercise per Session: 60 min  Stress: No Stress Concern Present (09/14/2017)   [place]    Feeling of Stress : Only a little  Social Connections: Not on file  Intimate Partner Violence: Not on file   Family History:  Family History  Problem Relation Age of Onset   Heart disease Mother    Brain cancer Father 21       Brain tumor, not cancerous   Hypertension Sister     Osteoporosis Sister    Arthritis Sister    Scoliosis Sister    Hypertension Sister    Osteoporosis Sister    Cancer Paternal Grandfather    Suicidality Paternal Grandfather    Breast cancer Neg Hx     Review of Systems: Constitutional: Doesn't report fevers, chills or abnormal weight loss Eyes: Doesn't report blurriness of vision Ears, nose, mouth, throat, and face: Doesn't report sore throat Respiratory: Doesn't report cough, dyspnea or wheezes Cardiovascular: Doesn't report palpitation, chest discomfort  Gastrointestinal:  Doesn't report nausea, constipation, diarrhea GU: Doesn't report incontinence Skin: Doesn't report skin rashes Neurological: Per HPI Musculoskeletal: Doesn't report joint pain Behavioral/Psych: Doesn't report anxiety  Physical Exam: Vitals:   10/29/21 1239 10/29/21 1417  BP: (!) 115/49 (!) 116/52  Pulse: 68 (!) 59  Resp: 17 18  Temp: (!) 97.5 F (36.4 C) 97.7 F (36.5 C)  SpO2: 98% 92%   KPS: 80. General: Alert, cooperative, pleasant, in no acute distress Head: Normal EENT: No conjunctival injection or scleral icterus.  Lungs: Resp effort normal Cardiac: Regular rate Abdomen: Non-distended abdomen Skin: No rashes cyanosis or petechiae. Extremities: No clubbing or edema  Neurologic Exam: Mental Status: Awake, alert, attentive to examiner. Oriented to self and environment. Language is fluent with intact comprehension.  Cranial Nerves: Visual acuity is grossly normal. Visual fields are full. Extra-ocular movements intact. No ptosis. Face is symmetric Motor: Tone and bulk are normal. Power is full in both arms and legs. Reflexes are symmetric, no pathologic reflexes present.  Sensory: Intact to light touch Gait: Orthopedic limitation, cane assisted   Labs: I have reviewed the data as listed    Component Value Date/Time   NA 141 10/29/2021 0527   K 3.5 10/29/2021 0527   CL 96 (L) 10/29/2021 0527   CO2 37 (H) 10/29/2021 0527   GLUCOSE 156 (H)  10/29/2021 0527   BUN 24 (H) 10/29/2021 0527   CREATININE 0.82 10/29/2021 0527   CREATININE 0.69 09/23/2021 1611   CALCIUM 8.5 (L) 10/29/2021 8453  PROT 5.3 (L) 10/29/2021 0527   ALBUMIN 2.9 (L) 10/29/2021 0527   AST 28 10/29/2021 0527   ALT 34 10/29/2021 0527   ALKPHOS 39 10/29/2021 0527   BILITOT 0.8 10/29/2021 0527   GFRNONAA >60 10/29/2021 0527   GFRNONAA 66 09/19/2020 0000   GFRAA 76 09/19/2020 0000   Lab Results  Component Value Date   WBC 7.7 10/29/2021   NEUTROABS 6.8 10/29/2021   HGB 12.2 10/29/2021   HCT 35.5 (L) 10/29/2021   MCV 95.4 10/29/2021   PLT 161 10/29/2021    Pathology: SURGICAL PATHOLOGY  CASE: [id number]  PATIENT: Ava Timm  Surgical Pathology Report   Clinical History: brain tumor (cm)   FINAL MICROSCOPIC DIAGNOSIS:   A. BRAIN TUMOR, RIGHT FRONTAL LOBE, BIOPSY:  -  Diffuse large B-cell lymphoma.  B. BRAIN TUMOR, RIGHT FRONTAL LOBE, BIOPSY:  -  Diffuse large B-cell lymphoma.   Note: The cells of interest consist of large cells with prominent small  nuclear membrane bound nucleoli and apparent scant cytoplasm.  Immunohistochemical stains with appropriate controls were performed on  both parts A and B given the morphologic differences (likely frozen  section artifact).  The cells of interest are positive for CD20 and PAX5  consistent with B-cell origin.  GFAP highlights background brain  parenchyma.  These same cells coexpress MUM1 (greater than 50%) BCL6 and  CD5.  CD10 is essentially negative.  Bcl-2 is equivocal given high  background but interpreted as likely positive.  CD3 highlights  background small T cells.  The proliferation rate by Ki-67 is  approximately 50%.  CD30 is negative.  EBER ISH is negative.  P53 shows  scattered positive cells of uncertain clinical significance.  Overall  the findings are consistent with a diffuse large B-cell lymphoma; if a  systemic work-up is negative for lymphoma, the findings would be   consistent with a primary CNS lymphoma.  The immunophenotype is most  consistent with a post germinal center/activated immunophenotype.  There  is sufficient material present for further FISH/molecular work-up if  clinically indicated (please call office for add-on testing if  necessary).   Dr. Gari Crown has peer reviewed the case and agrees with the interpretation.   INTRAOPERATIVE DIAGNOSIS:   A. Brain Tumor, Right Frontal Lobe, Biopsy: "High grade astrocytoma."  Intraoperative diagnosis rendered by Dr. Thornell Sartorius at 14:55 on 02 Sep 2021.    Assessment/Plan Primary CNS Lymphoma  10/29/21 -Rituximab infusion today -Con't leucovorin q6h -Decrease IVF to 125/hr -Trend MTX level tmrw, today >1  10/28/21 -Increase IVF to 150/hr given urine ph 7 today -Replete K+ IV 54mq q1 hour x4 -Begin leucovorin today once 24h s/p MTX infusion -Plan for first MTX level tomorrow AM -Rituximab infusion for 7/19  --------------------------------- -Once urine alkalinized and labs reviewed, appropriate to proceed with C2 of high dose MTX with leucovorin rescue inpatient -MTX-R therapy C2D1 hopefully tomorrow 10/28/21: Methotrexate 3.5g/m2 given over 4 hours once urine pH >= 7.0 -Leucovorin 180mq6 hours 24 hours after MTX infusion   -Rituxan can be given 24-48 hours after MTX infusion -To start once electrolytes stabilized: IV fluids, dextrose with 15072msodium bicarbonate at 125m15m.  Need to adjust rate to urine pH >= 7 -daily labs with cbc, cmp -daily urine pH once it reaches target pH of >7 -daily MTX levels following MTX infusion until MTX levels <0.10 -continue same supportive medications with exception of protonix which interferes with clearance of MTX  -con't decadron 2mg 16mly -LFTs and renal function both  normal at this time   May continue home meds as prior, we will hold vitamins and supplements during the admission.  The total time spent in the encounter was  35 minutes  and more than  50% was on counseling and review of test results  Please call/text/page with any clinical concerns, updates.  Daisy Sellers, MD Medical Director of Neuro-Oncology [facility] at [place] 09/16/21 7:20 PM

## 2021-10-30 ENCOUNTER — Encounter: Payer: Self-pay | Admitting: Internal Medicine

## 2021-10-30 DIAGNOSIS — C8589 Other specified types of non-Hodgkin lymphoma, extranodal and solid organ sites: Secondary | ICD-10-CM | POA: Diagnosis not present

## 2021-10-30 LAB — CBC WITH DIFFERENTIAL/PLATELET
Abs Immature Granulocytes: 0.07 10*3/uL (ref 0.00–0.07)
Basophils Absolute: 0 10*3/uL (ref 0.0–0.1)
Basophils Relative: 0 %
Eosinophils Absolute: 0 10*3/uL (ref 0.0–0.5)
Eosinophils Relative: 0 %
HCT: 35.8 % — ABNORMAL LOW (ref 36.0–46.0)
Hemoglobin: 12 g/dL (ref 12.0–15.0)
Immature Granulocytes: 1 %
Lymphocytes Relative: 7 %
Lymphs Abs: 0.6 10*3/uL — ABNORMAL LOW (ref 0.7–4.0)
MCH: 32.4 pg (ref 26.0–34.0)
MCHC: 33.5 g/dL (ref 30.0–36.0)
MCV: 96.8 fL (ref 80.0–100.0)
Monocytes Absolute: 0.3 10*3/uL (ref 0.1–1.0)
Monocytes Relative: 4 %
Neutro Abs: 7.4 10*3/uL (ref 1.7–7.7)
Neutrophils Relative %: 88 %
Platelets: 162 10*3/uL (ref 150–400)
RBC: 3.7 MIL/uL — ABNORMAL LOW (ref 3.87–5.11)
RDW: 14 % (ref 11.5–15.5)
WBC: 8.4 10*3/uL (ref 4.0–10.5)
nRBC: 0 % (ref 0.0–0.2)

## 2021-10-30 LAB — URINALYSIS, ROUTINE W REFLEX MICROSCOPIC
Bilirubin Urine: NEGATIVE
Glucose, UA: NEGATIVE mg/dL
Hgb urine dipstick: NEGATIVE
Ketones, ur: NEGATIVE mg/dL
Leukocytes,Ua: NEGATIVE
Nitrite: NEGATIVE
Protein, ur: NEGATIVE mg/dL
Specific Gravity, Urine: 1.008 (ref 1.005–1.030)
pH: 9 — ABNORMAL HIGH (ref 5.0–8.0)

## 2021-10-30 LAB — COMPREHENSIVE METABOLIC PANEL
ALT: 52 U/L — ABNORMAL HIGH (ref 0–44)
AST: 51 U/L — ABNORMAL HIGH (ref 15–41)
Albumin: 2.7 g/dL — ABNORMAL LOW (ref 3.5–5.0)
Alkaline Phosphatase: 35 U/L — ABNORMAL LOW (ref 38–126)
Anion gap: 8 (ref 5–15)
BUN: 30 mg/dL — ABNORMAL HIGH (ref 8–23)
CO2: 38 mmol/L — ABNORMAL HIGH (ref 22–32)
Calcium: 8.5 mg/dL — ABNORMAL LOW (ref 8.9–10.3)
Chloride: 95 mmol/L — ABNORMAL LOW (ref 98–111)
Creatinine, Ser: 0.93 mg/dL (ref 0.44–1.00)
GFR, Estimated: >60 mL/min (ref >60)
Glucose, Bld: 118 mg/dL — ABNORMAL HIGH (ref 70–99)
Potassium: 2.9 mmol/L — ABNORMAL LOW (ref 3.5–5.1)
Sodium: 141 mmol/L (ref 135–145)
Total Bilirubin: 0.9 mg/dL (ref 0.3–1.2)
Total Protein: 5.3 g/dL — ABNORMAL LOW (ref 6.5–8.1)

## 2021-10-30 LAB — METHOTREXATE: Methotrexate: 0.31

## 2021-10-30 MED ORDER — LEUCOVORIN CALCIUM INJECTION 100 MG
15.0000 mg | Freq: Four times a day (QID) | INTRAMUSCULAR | Status: AC
  Administered 2021-10-30 – 2021-10-31 (×4): 16 mg via INTRAVENOUS
  Filled 2021-10-30 (×9): qty 0.8

## 2021-10-30 MED ORDER — POTASSIUM CHLORIDE 10 MEQ/100ML IV SOLN
10.0000 meq | INTRAVENOUS | Status: AC
  Administered 2021-10-30 (×4): 10 meq via INTRAVENOUS
  Filled 2021-10-30 (×4): qty 100

## 2021-10-30 NOTE — Progress Notes (Signed)
[facility] [place], [place] [address] [phone]   Inpatient Note  Date of Service: 09/16/21 Patient Name: Daisy Oliver Patient MRN: [id number] Patient DOB: 11-Oct-1935 Provider: [name], MD  Identifying Statement:  Daisy Oliver is a 86 y.o. female with right frontal  CNS lymphoma  who presents for first cycle of high dose methotrexate, rituximab.  Referring Provider: [name], MD [place],  [place] [id number]  Oncologic History: Oncology History  Primary CNS lymphoma [facility])  09/02/2021 Surgery   Stereotactic biopsy by Dr. Marcello Moores; path is B-cell lymphoma CD20+   09/30/2021 -  Chemotherapy   Patient is on Treatment Plan : IP NON-HODGKINS LYMPHOMA High Dose Methotrexate + Leucovorin Rescue     10/02/2021 -  Chemotherapy   Patient is on Treatment Plan : NON-HODGKINS LYMPHOMA Rituximab q21d       Biomarkers:  CD20 positive .  Ki-67 50% .   Unknown   Unknown   History of Present Illness: Daisy Oliver is feeling well, now having completed the rituximab.  No new complaints today.  Hopefully will be able to go home tomorrow.  Patient presented to medical attention in May 2023 with several weeks of progressive gait instability.  She describes feeling unsteady on her feet, even falling "a couple of times".  After one of the falls, ED visit led to CNS imaging which demonstrated multifocal masses in the right hemisphere.  She underwent stereotactic biopsy with Dr. Marcello Moores on 09/02/21; path demonstrated diffuse large B-cell lymphoma, CD20+.  Since surgery, she has tapered off the steroids without issue.  She feels "fairly normal" aside from pain in her knee and lower back.  She uses a cane to walk, mainly because of the knee pain.  No issues with frank weakness, no seizures.  She lives at home with her husband who has dementia.  Daughter is nearby in [place].    Medications: No  current facility-administered medications on file prior to encounter.   Current Outpatient Medications on File Prior to Encounter  Medication Sig Dispense Refill   acetaminophen (TYLENOL) 500 MG tablet Take 1,000 mg by mouth every 8 (eight) hours as needed (pain).     Ascorbic Acid (VITAMIN C PO) Take 1 tablet by mouth 2 (two) times daily.     BIOTIN PO Take 1 tablet by mouth 2 (two) times daily.     dexamethasone (DECADRON) 2 MG tablet Take 1 tablet (2 mg total) by mouth daily. (Patient taking differently: Take 1 mg by mouth 2 (two) times daily.) 30 tablet 0   Emollient (CERAVE HEALING) OINT Apply 1 Application topically 2 (two) times daily as needed (wound care/dry skin).     Famotidine (PEPCID PO) Take 20 mg by mouth at bedtime as needed (acid reflux/heartburn).     liothyronine (CYTOMEL) 5 MCG tablet Take 2 tablets (10 mcg total) by mouth See admin instructions. Take 10 mcg before breakfast. (Patient taking differently: Take 10 mcg by mouth See admin instructions. Take two tablets (10 mcg) by mouth daily before breakfast and lunch) 180 tablet 3   MAGNESIUM PO Take 1 capsule by mouth 2 (two) times daily.     Melatonin 10 MG TABS Take 10 mg by mouth at bedtime.     Menthol, Topical Analgesic, (BIOFREEZE EX) Apply 1 Application topically 2 (two) times daily as needed (leg pain).     Misc Natural Products (OSTEO BI-FLEX JOINT SHIELD PO)  Take 1 tablet by mouth 2 (two) times daily.     Multiple Vitamin (MULTIVITAMIN WITH MINERALS) TABS tablet Take 1 tablet by mouth 2 (two) times daily.     Multiple Vitamins-Minerals (ZINC PO) Take 1 tablet by mouth 2 (two) times daily.     nystatin ointment (MYCOSTATIN) Apply 1 Application topically every morning.     olmesartan (BENICAR) 20 MG tablet Take 1/2 tablet  Daily  for BP (Patient taking differently: Take 10 mg by mouth every morning.   for BP) 45 tablet 3   Polyethyl Glycol-Propyl Glycol (SYSTANE) 0.4-0.3 % GEL ophthalmic gel Place 1 Application into both  eyes at bedtime.     Polyethyl Glycol-Propyl Glycol (SYSTANE) 0.4-0.3 % SOLN Place 1 drop into both eyes every morning.     Pramox-PE-Glycerin-Petrolatum (PREPARATION H) 1-0.25-14.4-15 % CREA Apply 1 Application topically 2 (two) times daily as needed (hemorrhoid pain).     TAURINE PO Take 1 capsule by mouth 2 (two) times daily.     tretinoin (RETIN-A) 0.05 % cream Apply topically at bedtime. (Patient taking differently: Apply 1 application  topically every Monday, Wednesday, and Friday at 8 PM.) 45 g 0   Zinc Oxide 40 % PSTE Apply 1 Application topically 2 (two) times daily as needed (wound care/barrier cream).     acetaminophen (TYLENOL) 325 MG tablet Take 650 mg by mouth every 6 (six) hours as needed (for pain). (Patient not taking: Reported on 09/29/2021)     traMADol (ULTRAM) 50 MG tablet Take 25 mg by mouth at bedtime. 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 Herbert Deaner   FRAMELESS  BIOPSY  WITH BRAINLAB Right 09/02/2021   Procedure: FRONTAL STEREOTACTIC BRAIN BIOPSY;  Surgeon: Vallarie Mare, MD;  Location: [facility];  Service: Neurosurgery;  Laterality: Right;   IR IMAGING GUIDED PORT INSERTION  09/24/2021   RADIAL HEAD ARTHROPLASTY Right 09/16/2012   Procedure: RADIAL HEAD ARTHROPLASTY;  Surgeon: Johnny Bridge, MD;  Location: [facility];  Service: Orthopedics;  Laterality: Right;  RADIAL HEAD REPLACEMENT     Social History:  Social History   Socioeconomic History   Marital status: Married    Spouse name: Not on file   Number of children: 2   Years of education: Not on file   Highest education level: Not on file  Occupational History   Not on file  Tobacco  Use   Smoking status: Former    Packs/day: 2.00    Years: 22.00    Total pack years: 44.00    Types: Cigarettes    Quit date: 09/17/1983    Years since quitting: 38.1   Smokeless tobacco: Never  Vaping Use   Vaping Use: Never used  Substance and Sexual Activity   Alcohol use: Not Currently    Alcohol/week: 0.0 - 1.0 standard drinks of alcohol    Comment: 2/month   Drug use: No   Sexual activity: Not Currently  Other Topics Concern   Not on file  Social History Narrative   Not on file   Social Determinants of Health   Financial Resource Strain: Not on file  Food Insecurity: Not on file  Transportation Needs: Not on file  Physical Activity: Sufficiently Active (09/14/2017)   Exercise Vital Sign    Days of Exercise per Week: 5 days    Minutes of Exercise per Session: 60 min  Stress: No Stress Concern Present (09/14/2017)   [place]    Feeling of Stress : Only a little  Social Connections: Not on file  Intimate Partner Violence: Not on file   Family History:  Family History  Problem Relation Age of Onset   Heart disease Mother    Brain cancer Father 98       Brain tumor, not cancerous   Hypertension Sister     Osteoporosis Sister    Arthritis Sister    Scoliosis Sister    Hypertension Sister    Osteoporosis Sister    Cancer Paternal Grandfather    Suicidality Paternal Grandfather    Breast cancer Neg Hx     Review of Systems: Constitutional: Doesn't report fevers, chills or abnormal weight loss Eyes: Doesn't report blurriness of vision Ears, nose, mouth, throat, and face: Doesn't report sore throat Respiratory: Doesn't report cough, dyspnea or wheezes Cardiovascular: Doesn't report palpitation, chest discomfort  Gastrointestinal:  Doesn't report nausea, constipation, diarrhea GU: Doesn't report incontinence Skin: Doesn't report skin rashes Neurological: Per HPI Musculoskeletal: Doesn't report joint pain Behavioral/Psych: Doesn't report anxiety  Physical Exam: Vitals:   10/30/21 0557 10/30/21 1345  BP: (!) 151/66 139/61  Pulse: (!) 49 (!) 44  Resp: 14 16  Temp: 97.8 F (36.6 C) 97.7 F (36.5 C)  SpO2: 92% 93%   KPS: 80. General: Alert, cooperative, pleasant, in no acute distress Head: Normal EENT: No conjunctival injection or scleral icterus.  Lungs: Resp effort normal Cardiac: Regular rate Abdomen: Non-distended abdomen Skin: No rashes cyanosis or petechiae. Extremities: No clubbing or edema  Neurologic Exam: Mental Status: Awake, alert, attentive to examiner. Oriented to self and environment. Language is fluent with intact comprehension.  Cranial Nerves: Visual acuity is grossly normal. Visual fields are full. Extra-ocular movements intact. No ptosis. Face is symmetric Motor: Tone and bulk are normal. Power is full in both arms and legs. Reflexes are symmetric, no pathologic reflexes present.  Sensory: Intact to light touch Gait: Orthopedic limitation, cane assisted   Labs: I have reviewed the data as listed    Component Value Date/Time   NA 141 10/30/2021 0524   K 2.9 (L) 10/30/2021 0524   CL 95 (L) 10/30/2021 0524   CO2 38 (H) 10/30/2021 0524   GLUCOSE 118 (H)  10/30/2021 0524   BUN 30 (H) 10/30/2021 0524   CREATININE 0.93 10/30/2021 0524   CREATININE 0.69 09/23/2021 1611   CALCIUM 8.5 (L) 10/30/2021 8657  PROT 5.3 (L) 10/30/2021 0524   ALBUMIN 2.7 (L) 10/30/2021 0524   AST 51 (H) 10/30/2021 0524   ALT 52 (H) 10/30/2021 0524   ALKPHOS 35 (L) 10/30/2021 0524   BILITOT 0.9 10/30/2021 0524   GFRNONAA >60 10/30/2021 0524   GFRNONAA 66 09/19/2020 0000   GFRAA 76 09/19/2020 0000   Lab Results  Component Value Date   WBC 8.4 10/30/2021   NEUTROABS 7.4 10/30/2021   HGB 12.0 10/30/2021   HCT 35.8 (L) 10/30/2021   MCV 96.8 10/30/2021   PLT 162 10/30/2021    Pathology: SURGICAL PATHOLOGY  CASE: [id number]  PATIENT: Arlayne Dave  Surgical Pathology Report   Clinical History: brain tumor (cm)   FINAL MICROSCOPIC DIAGNOSIS:   A. BRAIN TUMOR, RIGHT FRONTAL LOBE, BIOPSY:  -  Diffuse large B-cell lymphoma.  B. BRAIN TUMOR, RIGHT FRONTAL LOBE, BIOPSY:  -  Diffuse large B-cell lymphoma.   Note: The cells of interest consist of large cells with prominent small  nuclear membrane bound nucleoli and apparent scant cytoplasm.  Immunohistochemical stains with appropriate controls were performed on  both parts A and B given the morphologic differences (likely frozen  section artifact).  The cells of interest are positive for CD20 and PAX5  consistent with B-cell origin.  GFAP highlights background brain  parenchyma.  These same cells coexpress MUM1 (greater than 50%) BCL6 and  CD5.  CD10 is essentially negative.  Bcl-2 is equivocal given high  background but interpreted as likely positive.  CD3 highlights  background small T cells.  The proliferation rate by Ki-67 is  approximately 50%.  CD30 is negative.  EBER ISH is negative.  P53 shows  scattered positive cells of uncertain clinical significance.  Overall  the findings are consistent with a diffuse large B-cell lymphoma; if a  systemic work-up is negative for lymphoma, the findings would  be  consistent with a primary CNS lymphoma.  The immunophenotype is most  consistent with a post germinal center/activated immunophenotype.  There  is sufficient material present for further FISH/molecular work-up if  clinically indicated (please call office for add-on testing if  necessary).   Dr. Gari Crown has peer reviewed the case and agrees with the interpretation.   INTRAOPERATIVE DIAGNOSIS:   A. Brain Tumor, Right Frontal Lobe, Biopsy: "High grade astrocytoma."  Intraoperative diagnosis rendered by Dr. Thornell Sartorius at 14:55 on 02 Sep 2021.    Assessment/Plan Primary CNS Lymphoma  10/30/21 -Con't leucovorin q6h -MTX level 0.31 -Decrease IVF to 100/hr -Replete K+ via IVPB -Discharge tomorrow if MTX< 0.10  10/29/21 -Rituximab infusion today -Con't leucovorin q6h -Decrease IVF to 125/hr -Trend MTX level tmrw, today >1  10/28/21 -Increase IVF to 150/hr given urine ph 7 today -Replete K+ IV 79mq q1 hour x4 -Begin leucovorin today once 24h s/p MTX infusion -Plan for first MTX level tomorrow AM -Rituximab infusion for 7/19  --------------------------------- -Once urine alkalinized and labs reviewed, appropriate to proceed with C2 of high dose MTX with leucovorin rescue inpatient -MTX-R therapy C2D1 hopefully tomorrow 10/28/21: Methotrexate 3.5g/m2 given over 4 hours once urine pH >= 7.0 -Leucovorin 141mq6 hours 24 hours after MTX infusion   -Rituxan can be given 24-48 hours after MTX infusion -To start once electrolytes stabilized: IV fluids, dextrose with 15011msodium bicarbonate at 125m55m.  Need to adjust rate to urine pH >= 7 -daily labs with cbc, cmp -daily urine pH once it reaches target pH of >7 -daily MTX levels following MTX infusion until MTX levels <0.10 -  continue same supportive medications with exception of protonix which interferes with clearance of MTX  -con't decadron 64m daily -LFTs and renal function both normal at this time   May continue home meds as  prior, we will hold vitamins and supplements during the admission.  The total time spent in the encounter was  35 minutes  and more than 50% was on counseling and review of test results  Please call/text/page with any clinical concerns, updates.  ZVentura Sellers MD Medical Director of Neuro-Oncology [facility]06/06/23 3:59 PM

## 2021-10-30 NOTE — Progress Notes (Signed)
   This pt is currently active with our [facility] program which is a home based Palliative Care program provided by [facility].  We will follow the pt at home after discharge to assist with any chronic/acute symptom management needs.    Webb Silversmith RN BSN [facility]  [phone]

## 2021-10-30 NOTE — Care Management Important Message (Signed)
Important Message  Patient Details IM Letter given to the Patient. Name: Daisy Oliver MRN: [id number] Date of Birth: 1935-08-01   Medicare Important Message Given:  Yes     Kerin Salen 10/30/2021, 9:53 AM

## 2021-10-31 ENCOUNTER — Encounter: Payer: Self-pay | Admitting: Internal Medicine

## 2021-10-31 ENCOUNTER — Other Ambulatory Visit (HOSPITAL_COMMUNITY): Payer: Self-pay

## 2021-10-31 DIAGNOSIS — C8589 Other specified types of non-Hodgkin lymphoma, extranodal and solid organ sites: Secondary | ICD-10-CM | POA: Diagnosis not present

## 2021-10-31 LAB — CBC WITH DIFFERENTIAL/PLATELET
Abs Immature Granulocytes: 0.06 10*3/uL (ref 0.00–0.07)
Basophils Absolute: 0 10*3/uL (ref 0.0–0.1)
Basophils Relative: 0 %
Eosinophils Absolute: 0.1 10*3/uL (ref 0.0–0.5)
Eosinophils Relative: 1 %
HCT: 33.2 % — ABNORMAL LOW (ref 36.0–46.0)
Hemoglobin: 11.4 g/dL — ABNORMAL LOW (ref 12.0–15.0)
Immature Granulocytes: 1 %
Lymphocytes Relative: 19 %
Lymphs Abs: 1.6 10*3/uL (ref 0.7–4.0)
MCH: 33.2 pg (ref 26.0–34.0)
MCHC: 34.3 g/dL (ref 30.0–36.0)
MCV: 96.8 fL (ref 80.0–100.0)
Monocytes Absolute: 0.1 10*3/uL (ref 0.1–1.0)
Monocytes Relative: 1 %
Neutro Abs: 6.4 10*3/uL (ref 1.7–7.7)
Neutrophils Relative %: 78 %
Platelets: 149 10*3/uL — ABNORMAL LOW (ref 150–400)
RBC: 3.43 MIL/uL — ABNORMAL LOW (ref 3.87–5.11)
RDW: 14.2 % (ref 11.5–15.5)
WBC: 8.2 10*3/uL (ref 4.0–10.5)
nRBC: 0 % (ref 0.0–0.2)

## 2021-10-31 LAB — COMPREHENSIVE METABOLIC PANEL
ALT: 137 U/L — ABNORMAL HIGH (ref 0–44)
AST: 132 U/L — ABNORMAL HIGH (ref 15–41)
Albumin: 2.5 g/dL — ABNORMAL LOW (ref 3.5–5.0)
Alkaline Phosphatase: 32 U/L — ABNORMAL LOW (ref 38–126)
Anion gap: 6 (ref 5–15)
BUN: 22 mg/dL (ref 8–23)
CO2: 40 mmol/L — ABNORMAL HIGH (ref 22–32)
Calcium: 8.2 mg/dL — ABNORMAL LOW (ref 8.9–10.3)
Chloride: 93 mmol/L — ABNORMAL LOW (ref 98–111)
Creatinine, Ser: 0.96 mg/dL (ref 0.44–1.00)
GFR, Estimated: 58 mL/min — ABNORMAL LOW (ref >60)
Glucose, Bld: 89 mg/dL (ref 70–99)
Potassium: 2.9 mmol/L — ABNORMAL LOW (ref 3.5–5.1)
Sodium: 139 mmol/L (ref 135–145)
Total Bilirubin: 0.9 mg/dL (ref 0.3–1.2)
Total Protein: 4.9 g/dL — ABNORMAL LOW (ref 6.5–8.1)

## 2021-10-31 LAB — URINALYSIS, ROUTINE W REFLEX MICROSCOPIC
Bacteria, UA: NONE SEEN
Bilirubin Urine: NEGATIVE
Glucose, UA: NEGATIVE mg/dL
Ketones, ur: NEGATIVE mg/dL
Leukocytes,Ua: NEGATIVE
Nitrite: NEGATIVE
Protein, ur: NEGATIVE mg/dL
Specific Gravity, Urine: 1.006 (ref 1.005–1.030)
pH: 9 — ABNORMAL HIGH (ref 5.0–8.0)

## 2021-10-31 LAB — METHOTREXATE: Methotrexate: 0.15

## 2021-10-31 MED ORDER — HEPARIN SOD (PORK) LOCK FLUSH 100 UNIT/ML IV SOLN
500.0000 [IU] | Freq: Once | INTRAVENOUS | Status: AC
  Administered 2021-10-31: 500 [IU] via INTRAVENOUS
  Filled 2021-10-31: qty 5

## 2021-10-31 MED ORDER — LEUCOVORIN CALCIUM 5 MG PO TABS
15.0000 mg | ORAL_TABLET | Freq: Four times a day (QID) | ORAL | 0 refills | Status: AC
  Filled 2021-10-31: qty 24, 2d supply, fill #0

## 2021-10-31 MED ORDER — POTASSIUM CHLORIDE CRYS ER 20 MEQ PO TBCR
40.0000 meq | EXTENDED_RELEASE_TABLET | ORAL | Status: AC
  Administered 2021-10-31 (×2): 40 meq via ORAL
  Filled 2021-10-31 (×2): qty 2

## 2021-10-31 MED ORDER — POTASSIUM CHLORIDE 10 MEQ/100ML IV SOLN: 10.0000 meq | INTRAVENOUS | Status: DC

## 2021-10-31 NOTE — Discharge Summary (Signed)
Physician Discharge Summary  Mark Benecke PPJ:[id number] DOB: 03/01/1936 DOA: 10/27/2021  PCP: [name], MD  Admit date: 10/27/2021 Discharge date: 10/31/2021  Recommendations for Outpatient Follow-up:  We will call patient to arrange next inpatient chemo admission, tentatively planning for Monday, August 21st.   Discharge Diagnoses:  Primary CNS lymphoma ([place]) - Plan: acetaminophen (TYLENOL) tablet 650 mg, senna-docusate (Senokot-S) tablet 1 tablet, ondansetron (ZOFRAN) tablet 4-8 mg, ondansetron (ZOFRAN-ODT) disintegrating tablet 4-8 mg, ondansetron (ZOFRAN) injection 4 mg, ondansetron (ZOFRAN) 8 mg in sodium chloride 0.9 % 50 mL IVPB, alum & mag hydroxide-simeth (MAALOX/MYLANTA) 200-200-20 MG/5ML suspension 60 mL, guaiFENesin-dextromethorphan (ROBITUSSIN DM) 100-10 MG/5ML syrup 10 mL, hydrocortisone (ANUSOL-HC) 2.5 % rectal cream 1 Application, enoxaparin (LOVENOX) injection 40 mg, sodium bicarbonate 150 mEq in dextrose 5 % 1,150 mL infusion, dexamethasone (DECADRON) tablet 2 mg, famotidine (PEPCID) tablet 20 mg, liothyronine (CYTOMEL) tablet 5 mcg, melatonin tablet 10 mg, traMADol (ULTRAM) tablet 25 mg, ondansetron (ZOFRAN) 16 mg, dexamethasone (DECADRON) 20 mg in sodium chloride 0.9 % 50 mL IVPB, methotrexate (PF) 6.265 g in dextrose 5 % 500 mL chemo infusion, irbesartan (AVAPRO) tablet 75 mg, 0.9 %  sodium chloride infusion, acetaminophen (TYLENOL) tablet 650 mg, diphenhydrAMINE (BENADRYL) capsule 50 mg, ondansetron (ZOFRAN) 16 mg, dexamethasone (DECADRON) 20 mg in sodium chloride 0.9 % 50 mL IVPB, leucovorin injection 16 mg, riTUXimab-pvvr (RUXIENCE) 700 mg in sodium chloride 0.9 % 180 mL infusion, ondansetron (ZOFRAN) 16 mg, dexamethasone (DECADRON) 20 mg in sodium chloride 0.9 % 50 mL IVPB, leucovorin injection 16 mg, leucovorin injection 16 mg, potassium chloride 10 mEq in 100 mL IVPB, DISCONTINUED: dextrose 5 % solution, DISCONTINUED: acetaminophen (TYLENOL) tablet 650 mg,  DISCONTINUED: irbesartan (AVAPRO) tablet 75 mg, DISCONTINUED: potassium chloride 10 mEq in 100 mL IVPB (principal)  Discharge Condition: Stable Disposition: Home  Diet recommendation: Heart Healthy  Filed Weights   10/27/21 1416  Weight: 155 lb 13.8 oz (70.7 kg)    History of present illness:  Daisy Oliver presented to medical attention in May 2023 with several weeks of progressive gait instability.  She describes feeling unsteady on her feet, even falling "a couple of times".  After one of the falls, ED visit led to CNS imaging which demonstrated multifocal masses in the right hemisphere.  She underwent stereotactic biopsy with Dr. Marcello Moores on 09/02/21; path demonstrated diffuse large B-cell lymphoma, CD20+.  Since surgery, she has tapered off the steroids without issue.  She feels "fairly normal" aside from pain in her knee and lower back.  She uses a cane to walk, mainly because of the knee pain.  No issues with frank weakness, no seizures.  She lives at home with her husband who has dementia.  Daughter is nearby in [place].   Today she presents for cycle #2 of methotrexate, following initial cycle last month.  No new complaints upon admission. Continues to have pain in left knee and leg as prior.   Hospital Course:  Patient was admitted on 10/27/21.  High dose IV methotrexate was administered without complication on 5/80.  Rituximab infusion was then administered on 7/19.  Leucovorin rescue was given every 6 hours starting 7/18 until methotrexate level returned near 0.10.  Patient was discharged with 8 additional doses of leucovorin '15mg'$  to be given every 6 hours.  Today's assessment: S: No new complaints today O: Vitals:  Vitals:   10/30/21 2211 10/31/21 0632  BP: (!) 141/63 136/60  Pulse: (!) 51 (!) 51  Resp: 14 14  Temp: 98 F (36.7 C)  97.8 F (36.6 C)  SpO2: 93% 94%   KPS: 80. General: Alert, cooperative, pleasant, in no acute distress Head: Normal EENT: No  conjunctival injection or scleral icterus.  Lungs: Resp effort normal Cardiac: Regular rate Abdomen: Non-distended abdomen Skin: No rashes cyanosis or petechiae. Extremities: No clubbing or edema   Neurologic Exam: Mental Status: Awake, alert, attentive to examiner. Oriented to self and environment. Language is fluent with intact comprehension.  Cranial Nerves: Visual acuity is grossly normal. Visual fields are full. Extra-ocular movements intact. No ptosis. Face is symmetric Motor: Tone and bulk are normal. Power is full in both arms and legs. Reflexes are symmetric, no pathologic reflexes present.  Sensory: Intact to light touch Gait: Orthopedic limitation, cane assisted    Discharge Instructions  Your methotrexate level is very close to safe clearance.  We have ordered 8 additional doses of leucovorin ('15mg'$  each dose) to be taken every 6 hours for the next 48 hours.  This is instead of keeping your in the hospital an additional day.  Based on your good tolerance of treatment this week, we will plan to proceed with cycle #3 of methotrexate.  We have availability the week of July 21st for next treatment and admission.    Please discontinue the decadron, if tolerated.    Allergies  Allergen Reactions   Barbiturates Other (See Comments)    Unknown reaction   Other Other (See Comments)    Patient has AIP and is not to have hormones or red wine   Pentothal [Thiopental] Other (See Comments)    Unknown reaction   Sulfa Antibiotics Other (See Comments)    Unknown reaction   Wheat Bran Nausea And Vomiting    The results of significant diagnostics from this hospitalization (including imaging, microbiology, ancillary and laboratory) are listed below for reference.    Significant Diagnostic Studies: No results found.  Microbiology: No results found for this or any previous visit (from the past 240 hour(s)).   Labs: Basic Metabolic Panel: Recent Labs  Lab 10/27/21 1101  10/28/21 0414 10/29/21 0527 10/30/21 0524 10/31/21 0500  NA 137 140 141 141 139  K 3.6 3.2* 3.5 2.9* 2.9*  CL 104 100 96* 95* 93*  CO2 27 33* 37* 38* 40*  GLUCOSE 103* 126* 156* 118* 89  BUN 23 22 24* 30* 22  CREATININE 0.54 0.62 0.82 0.93 0.96  CALCIUM 8.9 8.8* 8.5* 8.5* 8.2*   Liver Function Tests: Recent Labs  Lab 10/27/21 1101 10/28/21 0414 10/29/21 0527 10/30/21 0524 10/31/21 0500  AST '26 31 28 '$ 51* 132*  ALT 26 33 34 52* 137*  ALKPHOS 44 38 39 35* 32*  BILITOT 0.8 1.9* 0.8 0.9 0.9  PROT 6.4* 5.6* 5.3* 5.3* 4.9*  ALBUMIN 3.4* 2.9* 2.9* 2.7* 2.5*   No results for input(s): "LIPASE", "AMYLASE" in the last 168 hours. No results for input(s): "AMMONIA" in the last 168 hours. CBC: Recent Labs  Lab 10/27/21 1101 10/28/21 0414 10/29/21 0527 10/30/21 0524 10/31/21 0500  WBC 7.2 12.1* 7.7 8.4 8.2  NEUTROABS 5.4 10.7* 6.8 7.4 6.4  HGB 12.2 11.6* 12.2 12.0 11.4*  HCT 35.8* 34.0* 35.5* 35.8* 33.2*  MCV 97.0 95.5 95.4 96.8 96.8  PLT 169 167 161 162 149*   Cardiac Enzymes: No results for input(s): "CKTOTAL", "CKMB", "CKMBINDEX", "TROPONINI" in the last 168 hours. BNP: BNP (last 3 results) No results for input(s): "BNP" in the last 8760 hours.  ProBNP (last 3 results) No results for input(s): "PROBNP" in the last  8760 hours.  CBG: No results for input(s): "GLUCAP" in the last 168 hours.  Principal Problem:   Primary CNS lymphoma ([place])   Time coordinating discharge: 59mn  Signed: ZCecil Cobbs MD

## 2021-10-31 NOTE — Plan of Care (Signed)
Instructions were reviewed with patient. Port was deaccessed. All questions were answered. Patient was transported to main entrance by wheelchair.

## 2021-11-01 ENCOUNTER — Encounter: Payer: Self-pay | Admitting: Internal Medicine

## 2021-11-02 ENCOUNTER — Encounter: Payer: Self-pay | Admitting: Nurse Practitioner

## 2021-11-03 ENCOUNTER — Other Ambulatory Visit: Payer: Self-pay | Admitting: *Deleted

## 2021-11-03 ENCOUNTER — Encounter: Payer: Self-pay | Admitting: *Deleted

## 2021-11-03 ENCOUNTER — Other Ambulatory Visit: Payer: Self-pay

## 2021-11-03 MED ORDER — DEXAMETHASONE 2 MG PO TABS: 2.0000 mg | ORAL_TABLET | Freq: Every day | ORAL | 1 refills | Status: DC

## 2021-11-03 NOTE — Progress Notes (Signed)
Daughter called back stating they needed refill of medication to restart Decadron.  Order sent to pharmacy.

## 2021-11-04 DIAGNOSIS — R269 Unspecified abnormalities of gait and mobility: Secondary | ICD-10-CM | POA: Diagnosis not present

## 2021-11-04 DIAGNOSIS — M6281 Muscle weakness (generalized): Secondary | ICD-10-CM | POA: Diagnosis not present

## 2021-11-04 NOTE — Progress Notes (Deleted)
Assessment and Plan:  There are no diagnoses linked to this encounter.    Further disposition pending results of labs. Discussed med's effects and SE's.   Over 30 minutes of exam, counseling, chart review, and critical decision making was performed.   Future Appointments  Date Time Provider [place]  11/05/2021  9:00 AM Alycia Rossetti, NP [facility] None  04/01/2022  2:00 PM Darrol Jump, NP [facility] None  09/24/2022  3:00 PM Alycia Rossetti, NP [facility] None    ------------------------------------------------------------------------------------------------------------------   HPI There were no vitals taken for this visit. 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[place])    diagnosed at age 38   COVID-19 08/26/2020   Fracture of radial head, right, closed 09/16/2012   GERD (gastroesophageal reflux disease)    pepcid prn   Hypertension    Hypothyroidism    PONV (postoperative nausea and vomiting)      Allergies  Allergen Reactions   Barbiturates Other (See Comments)    Unknown reaction   Other Other (See Comments)    Patient has AIP and is not to have hormones or red wine   Pentothal [Thiopental] Other (See Comments)    Unknown reaction   Sulfa Antibiotics Other (See Comments)    Unknown reaction   Wheat Bran Nausea And Vomiting    Current Outpatient Medications on File Prior to Visit  Medication Sig   acetaminophen (TYLENOL) 325 MG tablet Take 650 mg by mouth every 6 (six) hours as needed (for pain). (Patient not taking: Reported on 09/29/2021)   Ascorbic Acid (VITAMIN C PO) Take 1 tablet by mouth 2 (two) times daily.   BIOTIN PO Take 1 tablet by mouth 2 (two) times daily.   dexamethasone (DECADRON) 2 MG tablet Take 1 tablet (2 mg total) by mouth daily.   Emollient (CERAVE HEALING) OINT Apply 1 Application topically 2 (two) times daily as needed (wound care/dry skin).   Famotidine (PEPCID  PO) Take 20 mg by mouth at bedtime as needed (acid reflux/heartburn).   liothyronine (CYTOMEL) 5 MCG tablet Take 2 tablets (10 mcg total) by mouth See admin instructions. Take 10 mcg before breakfast. (Patient taking differently: Take 10 mcg by mouth See admin instructions. Take two tablets (10 mcg) by mouth daily before breakfast and lunch)   MAGNESIUM PO Take 1 capsule by mouth 2 (two) times daily.   Melatonin 10 MG TABS Take 10 mg by mouth at bedtime.   Menthol, Topical Analgesic, (BIOFREEZE EX) Apply 1 Application topically 2 (two) times daily as needed (leg pain).   Misc Natural Products (OSTEO BI-FLEX JOINT SHIELD PO) Take 1 tablet by mouth 2 (two) times daily.   Multiple Vitamin (MULTIVITAMIN WITH MINERALS) TABS tablet Take 1 tablet by mouth 2 (two) times daily.   Multiple Vitamins-Minerals (ZINC PO) Take 1 tablet by mouth 2 (two) times daily.   olmesartan (BENICAR) 20 MG tablet Take 1/2 tablet  Daily  for BP (Patient taking differently: Take 10 mg by mouth every morning.   for BP)   Polyethyl Glycol-Propyl Glycol (SYSTANE) 0.4-0.3 % GEL ophthalmic gel Place 1 Application into both eyes at bedtime.   Polyethyl Glycol-Propyl Glycol (SYSTANE) 0.4-0.3 % SOLN Place 1 drop into both eyes every morning.   Pramox-PE-Glycerin-Petrolatum (PREPARATION H) 1-0.25-14.4-15 % CREA Apply 1 Application topically 2 (two) times daily as needed (hemorrhoid pain).   TAURINE PO Take 1 capsule by mouth 2 (two) times daily.   traMADol Veatrice Bourbon)  50 MG tablet Take 25 mg by mouth at bedtime. (Patient not taking: Reported on 10/27/2021)   tretinoin (RETIN-A) 0.05 % cream Apply topically at bedtime. (Patient taking differently: Apply 1 application  topically every Monday, Wednesday, and Friday at 8 PM.)   Zinc Oxide 40 % PSTE Apply 1 Application topically 2 (two) times daily as needed (wound care/barrier cream).   No current facility-administered medications on file prior to visit.    ROS: all negative except above.    Physical Exam:  There were no vitals taken for this visit.  General Appearance: Well nourished, in no apparent distress. Eyes: PERRLA, EOMs, conjunctiva no swelling or erythema Sinuses: No Frontal/maxillary tenderness ENT/Mouth: Ext aud canals clear, TMs without erythema, bulging. No erythema, swelling, or exudate on post pharynx.  Tonsils not swollen or erythematous. Hearing normal.  Neck: Supple, thyroid normal.  Respiratory: Respiratory effort normal, BS equal bilaterally without rales, rhonchi, wheezing or stridor.  Cardio: RRR with no MRGs. Brisk peripheral pulses without edema.  Abdomen: Soft, + BS.  Non tender, no guarding, rebound, hernias, masses. Lymphatics: Non tender without lymphadenopathy.  Musculoskeletal: Full ROM, 5/5 strength, normal gait.  Skin: Warm, dry without rashes, lesions, ecchymosis.  Neuro: Cranial nerves intact. Normal muscle tone, no cerebellar symptoms. Sensation intact.  Psych: Awake and oriented X 3, normal affect, Insight and Judgment appropriate.     Alycia Rossetti, NP 11:07 AM [facility]

## 2021-11-05 ENCOUNTER — Ambulatory Visit: Payer: Medicare PPO | Admitting: Nurse Practitioner

## 2021-11-05 ENCOUNTER — Encounter: Payer: Self-pay | Admitting: Nurse Practitioner

## 2021-11-05 VITALS — BP 150/80 | HR 72 | Temp 97.7°F | Ht 64.0 in | Wt 154.0 lb

## 2021-11-05 DIAGNOSIS — E876 Hypokalemia: Secondary | ICD-10-CM

## 2021-11-05 DIAGNOSIS — Z6826 Body mass index (BMI) 26.0-26.9, adult: Secondary | ICD-10-CM

## 2021-11-05 DIAGNOSIS — Z79899 Other long term (current) drug therapy: Secondary | ICD-10-CM | POA: Diagnosis not present

## 2021-11-05 DIAGNOSIS — E785 Hyperlipidemia, unspecified: Secondary | ICD-10-CM | POA: Diagnosis not present

## 2021-11-05 DIAGNOSIS — Z1389 Encounter for screening for other disorder: Secondary | ICD-10-CM | POA: Diagnosis not present

## 2021-11-05 DIAGNOSIS — N182 Chronic kidney disease, stage 2 (mild): Secondary | ICD-10-CM | POA: Diagnosis not present

## 2021-11-05 DIAGNOSIS — C8589 Other specified types of non-Hodgkin lymphoma, extranodal and solid organ sites: Secondary | ICD-10-CM | POA: Diagnosis not present

## 2021-11-05 DIAGNOSIS — R7989 Other specified abnormal findings of blood chemistry: Secondary | ICD-10-CM

## 2021-11-05 DIAGNOSIS — R7309 Other abnormal glucose: Secondary | ICD-10-CM

## 2021-11-05 DIAGNOSIS — I10 Essential (primary) hypertension: Secondary | ICD-10-CM

## 2021-11-05 DIAGNOSIS — D496 Neoplasm of unspecified behavior of brain: Secondary | ICD-10-CM

## 2021-11-05 DIAGNOSIS — G935 Compression of brain: Secondary | ICD-10-CM

## 2021-11-05 NOTE — Progress Notes (Signed)
FOLLOW UP  Assessment and Plan:   Hypertension Controlled  Discussed DASH (Dietary Approaches to Stop Hypertension) DASH diet is lower in sodium than a typical American diet. Cut back on foods that are high in saturated fat, cholesterol, and trans fats. Eat more whole-grain foods, fish, poultry, and nuts Remain active and exercise as tolerated daily.  Monitor BP at home-Call if greater than 130/80.    Hyperlipidemia Discussed lifestyle modifications. Recommended diet heavy in fruits and veggies, omega 3's. Decrease consumption of animal meats, cheeses, and dairy products. Remain active and exercise as tolerated. Continue to monitor.   Abnormal Glucose  Education: Reviewed 'ABCs' of diabetes management  A1C (<7) Blood pressure (<130/80) Cholesterol (LDL <70) Continue Eye Exam yearly  Continue Dental Exam Q6 mo Discussed dietary recommendations Discussed Physical Activity recommendations  Overweight Recommended diet heavy in fruits and veggies and low in animal meats, cheeses, and dairy products, appropriate calorie intake Remain active.  Neoplasm of the Brain/Primary CNS Lymphoma Good conversation today.   Able to engage without effort. Continue chemo/f/u with Dr. Mickeal Skinner, Oncology.  CKD Stage 2 Discussed how what you eat and drink can aide in kidney protection. Stay well hydrated. Avoid high salt foods. Avoid NSAIDS. Keep BP and BG well controlled.   Take medications as prescribed. Remain active and exercise as tolerated daily. Maintain weight.  Continue to monitor.  Medication Management All medications discussed and reviewed in full. All questions and concerns regarding medications addressed.    Elevated LFTs Check CMP Discussed nephrotoxic medications. Continue to monitor.  Hypokalemia Check CMP. Continue to monitor  Screening for hematuria Check UA Stay well hydrated   Continue diet and meds as discussed. Further disposition pending results of  labs. Discussed med's effects and SE's.   Over 30 minutes of exam, counseling, chart review, and critical decision making was performed.   Future Appointments  Date Time Provider [place]  04/01/2022  2:00 PM Darrol Jump, NP [facility] None  09/24/2022  3:00 PM Alycia Rossetti, NP [facility] None    ----------------------------------------------------------------------------------------------------------------------  HPI 86 y.o. female  presents for 3 month follow up on hypertension, cholesterol, diabetes, weight, lymphoma, neoplasm of brain.  Overall she seems well today.    Reports some trouble walking, now has cane.    She is continuing to care for her husband as much as she can.  She does have daily aide/help.  Follows with Dr. Mickeal Skinner, Oncology, receiving methotrexate and rituximab.  She started chemo admission 10/27/21.  BMI is Body mass index is 26.43 kg/m., she has not been working on diet and exercise. Wt Readings from Last 3 Encounters:  11/05/21 154 lb (69.9 kg)  10/27/21 155 lb 13.8 oz (70.7 kg)  09/23/21 150 lb 9.6 oz (68.3 kg)    Her blood pressure has been controlled at home, today their BP is BP: (!) 150/80  She does not workout. She denies chest pain, shortness of breath, dizziness.   She is not on cholesterol medication. Her cholesterol is at goal. The cholesterol last visit was:   Lab Results  Component Value Date   CHOL 182 09/23/2021   HDL 83 09/23/2021   LDLCALC 83 09/23/2021   TRIG 82 09/23/2021   CHOLHDL 2.2 09/23/2021    She has not been working on diet and exercise for prediabetes, and denies polydipsia and polyuria. Last A1C in the office was:  Lab Results  Component Value Date   HGBA1C 5.3 09/23/2021   Patient is on Vitamin D supplement.  Lab Results  Component Value Date   VD25OH >150 (H) 09/23/2021        Current Medications:  Current Outpatient Medications on File Prior to Visit  Medication Sig   acetaminophen  (TYLENOL) 325 MG tablet Take 650 mg by mouth every 6 (six) hours as needed (for pain).   Ascorbic Acid (VITAMIN C PO) Take 1 tablet by mouth 2 (two) times daily.   BIOTIN PO Take 1 tablet by mouth 2 (two) times daily.   dexamethasone (DECADRON) 2 MG tablet Take 1 tablet (2 mg total) by mouth daily.   Emollient (CERAVE HEALING) OINT Apply 1 Application topically 2 (two) times daily as needed (wound care/dry skin).   Famotidine (PEPCID PO) Take 20 mg by mouth at bedtime as needed (acid reflux/heartburn).   liothyronine (CYTOMEL) 5 MCG tablet Take 2 tablets (10 mcg total) by mouth See admin instructions. Take 10 mcg before breakfast. (Patient taking differently: Take 10 mcg by mouth See admin instructions. Take two tablets (10 mcg) by mouth daily before breakfast and lunch)   MAGNESIUM PO Take 1 capsule by mouth 2 (two) times daily.   Melatonin 10 MG TABS Take 10 mg by mouth at bedtime.   Menthol, Topical Analgesic, (BIOFREEZE EX) Apply 1 Application topically 2 (two) times daily as needed (leg pain).   Misc Natural Products (OSTEO BI-FLEX JOINT SHIELD PO) Take 1 tablet by mouth 2 (two) times daily.   Multiple Vitamin (MULTIVITAMIN WITH MINERALS) TABS tablet Take 1 tablet by mouth 2 (two) times daily.   Multiple Vitamins-Minerals (ZINC PO) Take 1 tablet by mouth 2 (two) times daily.   olmesartan (BENICAR) 20 MG tablet Take 1/2 tablet  Daily  for BP (Patient taking differently: Take 10 mg by mouth every morning.   for BP)   Polyethyl Glycol-Propyl Glycol (SYSTANE) 0.4-0.3 % GEL ophthalmic gel Place 1 Application into both eyes at bedtime.   Polyethyl Glycol-Propyl Glycol (SYSTANE) 0.4-0.3 % SOLN Place 1 drop into both eyes every morning.   Pramox-PE-Glycerin-Petrolatum (PREPARATION H) 1-0.25-14.4-15 % CREA Apply 1 Application topically 2 (two) times daily as needed (hemorrhoid pain).   TAURINE PO Take 1 capsule by mouth 2 (two) times daily.   traMADol (ULTRAM) 50 MG tablet Take 25 mg by mouth at  bedtime.   tretinoin (RETIN-A) 0.05 % cream Apply topically at bedtime. (Patient taking differently: Apply 1 application  topically every Monday, Wednesday, and Friday at 8 PM.)   Zinc Oxide 40 % PSTE Apply 1 Application topically 2 (two) times daily as needed (wound care/barrier cream).   No current facility-administered medications on file prior to visit.     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[place])    diagnosed at age 70   COVID-19 08/26/2020   Fracture of radial head, right, closed 09/16/2012   GERD (gastroesophageal reflux disease)    pepcid prn   Hypertension    Hypothyroidism    PONV (postoperative nausea and vomiting)    Family history- Reviewed and unchanged Social history- Reviewed and unchanged  Review of Systems:  ROS    Physical Exam: BP (!) 150/80   Pulse 72   Temp 97.7 F (36.5 C)   Ht '5\' 4"'$  (1.626 m)   Wt 154 lb (69.9 kg)   SpO2 94%   BMI 26.43 kg/m  Wt Readings from Last 3 Encounters:  11/05/21 154 lb (69.9 kg)  10/27/21 155 lb 13.8 oz (70.7 kg)  09/23/21 150 lb 9.6 oz (68.3 kg)   General Appearance: Well nourished, in no apparent distress. Eyes: PERRLA, EOMs, conjunctiva no swelling or erythema Sinuses: No Frontal/maxillary tenderness ENT/Mouth: Ext aud canals clear, TMs without erythema, bulging. No erythema, swelling, or exudate on post pharynx.  Tonsils not swollen or erythematous. Hearing normal.  Neck: Supple, thyroid normal.  Respiratory: Respiratory effort normal, BS equal bilaterally without rales, rhonchi, wheezing or stridor.   Cardio: RRR with no MRGs. Brisk peripheral pulses without edema.  Abdomen: Soft, + BS.  Non tender, no guarding, rebound, hernias, masses. Lymphatics: Non tender without lymphadenopathy.  Musculoskeletal: Full ROM, 5/5 strength, Antalgic gait using cane Skin: Warm, dry without rashes, lesions, ecchymosis.  Neuro: Cranial nerves intact. No cerebellar symptoms.  Psych: Awake and oriented X 3, normal affect, Insight and Judgment appropriate.    Darrol Jump, NP 4:03 PM [facility]

## 2021-11-06 ENCOUNTER — Encounter: Payer: Self-pay | Admitting: Internal Medicine

## 2021-11-06 DIAGNOSIS — M6281 Muscle weakness (generalized): Secondary | ICD-10-CM | POA: Diagnosis not present

## 2021-11-06 DIAGNOSIS — R269 Unspecified abnormalities of gait and mobility: Secondary | ICD-10-CM | POA: Diagnosis not present

## 2021-11-06 LAB — CBC WITH DIFFERENTIAL/PLATELET
Absolute Monocytes: 988 cells/uL — ABNORMAL HIGH (ref 200–950)
Basophils Absolute: 63 cells/uL (ref 0–200)
Basophils Relative: 0.8 %
Eosinophils Absolute: 79 cells/uL (ref 15–500)
Eosinophils Relative: 1 %
HCT: 36.4 % (ref 35.0–45.0)
Hemoglobin: 12.6 g/dL (ref 11.7–15.5)
Lymphs Abs: 1501 cells/uL (ref 850–3900)
MCH: 33 pg (ref 27.0–33.0)
MCHC: 34.6 g/dL (ref 32.0–36.0)
MCV: 95.3 fL (ref 80.0–100.0)
MPV: 10 fL (ref 7.5–12.5)
Monocytes Relative: 12.5 %
Neutro Abs: 5269 cells/uL (ref 1500–7800)
Neutrophils Relative %: 66.7 %
Platelets: 143 10*3/uL (ref 140–400)
RBC: 3.82 10*6/uL (ref 3.80–5.10)
RDW: 13.4 % (ref 11.0–15.0)
Total Lymphocyte: 19 %
WBC: 7.9 10*3/uL (ref 3.8–10.8)

## 2021-11-06 LAB — URINALYSIS W MICROSCOPIC + REFLEX CULTURE
Bacteria, UA: NONE SEEN /HPF
Bilirubin Urine: NEGATIVE
Glucose, UA: NEGATIVE
Hgb urine dipstick: NEGATIVE
Hyaline Cast: NONE SEEN /LPF
Ketones, ur: NEGATIVE
Leukocyte Esterase: NEGATIVE
Nitrites, Initial: NEGATIVE
Protein, ur: NEGATIVE
Specific Gravity, Urine: 1.013 (ref 1.001–1.035)
Squamous Epithelial / HPF: NONE SEEN /HPF (ref <=5)
WBC, UA: NONE SEEN /HPF (ref 0–5)
pH: 6 (ref 5.0–8.0)

## 2021-11-06 LAB — COMPLETE METABOLIC PANEL WITH GFR
AG Ratio: 1.3 (calc) (ref 1.0–2.5)
ALT: 70 U/L — ABNORMAL HIGH (ref 6–29)
AST: 26 U/L (ref 10–35)
Albumin: 4 g/dL (ref 3.6–5.1)
Alkaline phosphatase (APISO): 52 U/L (ref 37–153)
BUN: 23 mg/dL (ref 7–25)
CO2: 27 mmol/L (ref 20–32)
Calcium: 9.3 mg/dL (ref 8.6–10.4)
Chloride: 102 mmol/L (ref 98–110)
Creat: 0.85 mg/dL (ref 0.60–0.95)
Globulin: 3.1 g/dL (calc) (ref 1.9–3.7)
Glucose, Bld: 84 mg/dL (ref 65–99)
Potassium: 3.8 mmol/L (ref 3.5–5.3)
Sodium: 140 mmol/L (ref 135–146)
Total Bilirubin: 0.8 mg/dL (ref 0.2–1.2)
Total Protein: 7.1 g/dL (ref 6.1–8.1)
eGFR: 68 mL/min/{1.73_m2} (ref >=60)

## 2021-11-06 LAB — NO CULTURE INDICATED

## 2021-11-10 DIAGNOSIS — C711 Malignant neoplasm of frontal lobe: Secondary | ICD-10-CM | POA: Diagnosis not present

## 2021-11-10 DIAGNOSIS — N182 Chronic kidney disease, stage 2 (mild): Secondary | ICD-10-CM | POA: Diagnosis not present

## 2021-11-10 DIAGNOSIS — I129 Hypertensive chronic kidney disease with stage 1 through stage 4 chronic kidney disease, or unspecified chronic kidney disease: Secondary | ICD-10-CM | POA: Diagnosis not present

## 2021-11-11 ENCOUNTER — Other Ambulatory Visit: Payer: Self-pay

## 2021-11-11 DIAGNOSIS — R269 Unspecified abnormalities of gait and mobility: Secondary | ICD-10-CM | POA: Diagnosis not present

## 2021-11-11 DIAGNOSIS — M6281 Muscle weakness (generalized): Secondary | ICD-10-CM | POA: Diagnosis not present

## 2021-11-14 DIAGNOSIS — M6281 Muscle weakness (generalized): Secondary | ICD-10-CM | POA: Diagnosis not present

## 2021-11-14 DIAGNOSIS — R269 Unspecified abnormalities of gait and mobility: Secondary | ICD-10-CM | POA: Diagnosis not present

## 2021-11-17 ENCOUNTER — Ambulatory Visit: Payer: Medicare PPO | Admitting: Podiatry

## 2021-11-17 DIAGNOSIS — L989 Disorder of the skin and subcutaneous tissue, unspecified: Secondary | ICD-10-CM | POA: Diagnosis not present

## 2021-11-17 NOTE — Progress Notes (Signed)
Chief Complaint  Patient presents with   Callouses    L SWELLING / PAINFUL CALLUSES, HAS BEEN USING OTC COMPRESSION SOCKS, PAIN OCCURS EVEN WHEN NOT WALKING, PAIN HAS BEEN INTERMITTENT, RIGHT OVER LEFT    Subjective: 86 y.o. female presenting to the office today as a new patient for evaluation of symptomatic calluses to the plantar aspect of the fifth MTP bilateral that has been present for several years.  She tries to trim them down herself but recently they have been very painful and tender.  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 Herbert Deaner   FRAMELESS  BIOPSY WITH BRAINLAB Right 09/02/2021   Procedure: FRONTAL STEREOTACTIC BRAIN BIOPSY;  Surgeon: Vallarie Mare, MD;  Location: [facility];  Service: Neurosurgery;  Laterality: Right;   IR IMAGING GUIDED PORT INSERTION  09/24/2021   RADIAL HEAD ARTHROPLASTY Right 09/16/2012   Procedure: RADIAL HEAD ARTHROPLASTY;  Surgeon: Johnny Bridge, MD;  Location: [facility];  Service: Orthopedics;  Laterality: Right;  RADIAL HEAD REPLACEMENT      Allergies  Allergen Reactions   Barbiturates Other (See Comments)    Unknown reaction   Other Other (See Comments)    Patient has AIP and is not to have hormones or red wine   Pentothal [Thiopental] Other (See Comments)    Unknown reaction   Sulfa Antibiotics Other (See  Comments)    Unknown reaction   Wheat Bran Nausea And Vomiting     Objective:  Physical Exam General: Alert and oriented x3 in no acute distress  Dermatology: Hyperkeratotic lesion(s) present on the plantar aspect of the fifth MTP bilateral. Pain on palpation with a central nucleated core noted. Skin is warm, dry and supple bilateral lower extremities. Negative for open lesions or macerations.  Vascular: Palpable pedal pulses bilaterally. No edema or erythema noted. Capillary refill within normal limits.  Neurological: Epicritic and protective threshold grossly intact bilaterally.   Musculoskeletal Exam: Tenderness to palpation at the keratotic lesion(s) noted. Range of motion within normal limits bilateral. Muscle strength 5/5 in all groups bilateral.  Assessment: 1.  Symptomatic callus; benign skin lesion plantar fifth MTP bilateral   Plan of Care:  1. Patient evaluated 2. Excisional debridement of keratoic lesion(s) using a chisel blade was performed without incident.  3. Dressed area with light dressing. 4.  Recommend good supportive shoes and sneakers with arch supports.  Advised against going barefoot.   5.  Patient is to return to the clinic PRN.   Edrick Kins, DPM [facility]  Dr. Edrick Kins, DPM    [address]  [place], [place] [address]                Office [phone]  Fax [phone]

## 2021-11-18 DIAGNOSIS — R269 Unspecified abnormalities of gait and mobility: Secondary | ICD-10-CM | POA: Diagnosis not present

## 2021-11-18 DIAGNOSIS — M6281 Muscle weakness (generalized): Secondary | ICD-10-CM | POA: Diagnosis not present

## 2021-11-21 ENCOUNTER — Encounter: Payer: Self-pay | Admitting: Internal Medicine

## 2021-11-21 DIAGNOSIS — R269 Unspecified abnormalities of gait and mobility: Secondary | ICD-10-CM | POA: Diagnosis not present

## 2021-11-21 DIAGNOSIS — M6281 Muscle weakness (generalized): Secondary | ICD-10-CM | POA: Diagnosis not present

## 2021-11-23 ENCOUNTER — Other Ambulatory Visit: Payer: Self-pay | Admitting: Internal Medicine

## 2021-11-23 ENCOUNTER — Encounter: Payer: Self-pay | Admitting: Internal Medicine

## 2021-11-23 DIAGNOSIS — L719 Rosacea, unspecified: Secondary | ICD-10-CM

## 2021-11-23 MED ORDER — TRETINOIN 0.05 % EX CREA: TOPICAL_CREAM | Freq: Every day | CUTANEOUS | 3 refills | Status: DC

## 2021-11-24 DIAGNOSIS — R269 Unspecified abnormalities of gait and mobility: Secondary | ICD-10-CM | POA: Diagnosis not present

## 2021-11-24 DIAGNOSIS — M6281 Muscle weakness (generalized): Secondary | ICD-10-CM | POA: Diagnosis not present

## 2021-11-26 ENCOUNTER — Encounter: Payer: Self-pay | Admitting: Internal Medicine

## 2021-11-28 DIAGNOSIS — R269 Unspecified abnormalities of gait and mobility: Secondary | ICD-10-CM | POA: Diagnosis not present

## 2021-11-28 DIAGNOSIS — M6281 Muscle weakness (generalized): Secondary | ICD-10-CM | POA: Diagnosis not present

## 2021-11-30 ENCOUNTER — Encounter: Payer: Self-pay | Admitting: Internal Medicine

## 2021-12-01 ENCOUNTER — Encounter: Payer: Self-pay | Admitting: Internal Medicine

## 2021-12-01 ENCOUNTER — Inpatient Hospital Stay (HOSPITAL_COMMUNITY)
Admission: RE | Admit: 2021-12-01 | Discharge: 2021-12-07 | DRG: 846 | Disposition: A | Discharge status: Home / Self Care | Payer: Medicare PPO | Source: Ambulatory Visit | Attending: Internal Medicine | Admitting: Internal Medicine

## 2021-12-01 ENCOUNTER — Other Ambulatory Visit: Payer: Self-pay

## 2021-12-01 ENCOUNTER — Encounter (HOSPITAL_COMMUNITY): Payer: Self-pay | Admitting: Internal Medicine

## 2021-12-01 DIAGNOSIS — G935 Compression of brain: Secondary | ICD-10-CM

## 2021-12-01 DIAGNOSIS — Z888 Allergy status to other drugs, medicaments and biological substances status: Secondary | ICD-10-CM | POA: Diagnosis not present

## 2021-12-01 DIAGNOSIS — D496 Neoplasm of unspecified behavior of brain: Secondary | ICD-10-CM

## 2021-12-01 DIAGNOSIS — Z87891 Personal history of nicotine dependence: Secondary | ICD-10-CM

## 2021-12-01 DIAGNOSIS — C8339 Diffuse large B-cell lymphoma, extranodal and solid organ sites: Secondary | ICD-10-CM | POA: Diagnosis not present

## 2021-12-01 DIAGNOSIS — K219 Gastro-esophageal reflux disease without esophagitis: Secondary | ICD-10-CM | POA: Diagnosis not present

## 2021-12-01 DIAGNOSIS — Z8616 Personal history of COVID-19: Secondary | ICD-10-CM | POA: Diagnosis not present

## 2021-12-01 DIAGNOSIS — Z8261 Family history of arthritis: Secondary | ICD-10-CM | POA: Diagnosis not present

## 2021-12-01 DIAGNOSIS — Z882 Allergy status to sulfonamides status: Secondary | ICD-10-CM

## 2021-12-01 DIAGNOSIS — I10 Essential (primary) hypertension: Secondary | ICD-10-CM | POA: Diagnosis present

## 2021-12-01 DIAGNOSIS — E876 Hypokalemia: Secondary | ICD-10-CM

## 2021-12-01 DIAGNOSIS — C8589 Other specified types of non-Hodgkin lymphoma, extranodal and solid organ sites: Secondary | ICD-10-CM | POA: Diagnosis not present

## 2021-12-01 DIAGNOSIS — Z79899 Other long term (current) drug therapy: Secondary | ICD-10-CM

## 2021-12-01 DIAGNOSIS — E039 Hypothyroidism, unspecified: Secondary | ICD-10-CM | POA: Diagnosis present

## 2021-12-01 DIAGNOSIS — Z8262 Family history of osteoporosis: Secondary | ICD-10-CM | POA: Diagnosis not present

## 2021-12-01 DIAGNOSIS — Z808 Family history of malignant neoplasm of other organs or systems: Secondary | ICD-10-CM | POA: Diagnosis not present

## 2021-12-01 DIAGNOSIS — Z91018 Allergy to other foods: Secondary | ICD-10-CM | POA: Diagnosis not present

## 2021-12-01 DIAGNOSIS — Z8249 Family history of ischemic heart disease and other diseases of the circulatory system: Secondary | ICD-10-CM

## 2021-12-01 DIAGNOSIS — Z5111 Encounter for antineoplastic chemotherapy: Principal | ICD-10-CM

## 2021-12-01 LAB — CBC WITH DIFFERENTIAL/PLATELET
Abs Immature Granulocytes: 0.06 10*3/uL (ref 0.00–0.07)
Basophils Absolute: 0.1 10*3/uL (ref 0.0–0.1)
Basophils Relative: 1 %
Eosinophils Absolute: 0.3 10*3/uL (ref 0.0–0.5)
Eosinophils Relative: 3 %
HCT: 40.6 % (ref 36.0–46.0)
Hemoglobin: 13.3 g/dL (ref 12.0–15.0)
Immature Granulocytes: 1 %
Lymphocytes Relative: 10 %
Lymphs Abs: 0.9 10*3/uL (ref 0.7–4.0)
MCH: 32.9 pg (ref 26.0–34.0)
MCHC: 32.8 g/dL (ref 30.0–36.0)
MCV: 100.5 fL — ABNORMAL HIGH (ref 80.0–100.0)
Monocytes Absolute: 1 10*3/uL (ref 0.1–1.0)
Monocytes Relative: 11 %
Neutro Abs: 7.3 10*3/uL (ref 1.7–7.7)
Neutrophils Relative %: 74 %
Platelets: 182 10*3/uL (ref 150–400)
RBC: 4.04 MIL/uL (ref 3.87–5.11)
RDW: 13.6 % (ref 11.5–15.5)
WBC: 9.7 10*3/uL (ref 4.0–10.5)
nRBC: 0 % (ref 0.0–0.2)

## 2021-12-01 LAB — COMPREHENSIVE METABOLIC PANEL
ALT: 28 U/L (ref 0–44)
AST: 31 U/L (ref 15–41)
Albumin: 4 g/dL (ref 3.5–5.0)
Alkaline Phosphatase: 50 U/L (ref 38–126)
Anion gap: 10 (ref 5–15)
BUN: 26 mg/dL — ABNORMAL HIGH (ref 8–23)
CO2: 25 mmol/L (ref 22–32)
Calcium: 9.4 mg/dL (ref 8.9–10.3)
Chloride: 104 mmol/L (ref 98–111)
Creatinine, Ser: 0.8 mg/dL (ref 0.44–1.00)
GFR, Estimated: >60 mL/min (ref >60)
Glucose, Bld: 110 mg/dL — ABNORMAL HIGH (ref 70–99)
Potassium: 4 mmol/L (ref 3.5–5.1)
Sodium: 139 mmol/L (ref 135–145)
Total Bilirubin: 0.9 mg/dL (ref 0.3–1.2)
Total Protein: 7.3 g/dL (ref 6.5–8.1)

## 2021-12-01 LAB — URINALYSIS, ROUTINE W REFLEX MICROSCOPIC
Bilirubin Urine: NEGATIVE
Glucose, UA: NEGATIVE mg/dL
Hgb urine dipstick: NEGATIVE
Ketones, ur: NEGATIVE mg/dL
Leukocytes,Ua: NEGATIVE
Nitrite: NEGATIVE
Protein, ur: NEGATIVE mg/dL
Specific Gravity, Urine: 1.004 — ABNORMAL LOW (ref 1.005–1.030)
pH: 6 (ref 5.0–8.0)

## 2021-12-01 MED ORDER — HYDROCORTISONE (PERIANAL) 2.5 % EX CREA: 1.0000 | TOPICAL_CREAM | Freq: Two times a day (BID) | CUTANEOUS | Status: DC | PRN

## 2021-12-01 MED ORDER — IRBESARTAN 75 MG PO TABS: 37.5000 mg | ORAL_TABLET | Freq: Every day | ORAL | Status: DC

## 2021-12-01 MED ORDER — ENOXAPARIN SODIUM 40 MG/0.4ML IJ SOSY
40.0000 mg | PREFILLED_SYRINGE | INTRAMUSCULAR | Status: DC
  Administered 2021-12-01 – 2021-12-06 (×6): 40 mg via SUBCUTANEOUS
  Filled 2021-12-01 (×6): qty 0.4

## 2021-12-01 MED ORDER — ONDANSETRON HCL 4 MG/2ML IJ SOLN
4.0000 mg | Freq: Three times a day (TID) | INTRAMUSCULAR | Status: DC | PRN
  Administered 2021-12-04: 4 mg via INTRAVENOUS
  Filled 2021-12-01: qty 2

## 2021-12-01 MED ORDER — MELATONIN 5 MG PO TABS
10.0000 mg | ORAL_TABLET | Freq: Every evening | ORAL | Status: DC | PRN
  Administered 2021-12-01 – 2021-12-06 (×6): 10 mg via ORAL
  Filled 2021-12-01 (×6): qty 2

## 2021-12-01 MED ORDER — SODIUM BICARBONATE 8.4 % IV SOLN: INTRAVENOUS | Status: DC

## 2021-12-01 MED ORDER — TRAMADOL HCL 50 MG PO TABS
25.0000 mg | ORAL_TABLET | Freq: Every day | ORAL | Status: DC
  Administered 2021-12-01 – 2021-12-06 (×6): 25 mg via ORAL
  Filled 2021-12-01 (×6): qty 1

## 2021-12-01 MED ORDER — ONDANSETRON 4 MG PO TBDP: 4.0000 mg | ORAL_TABLET | Freq: Three times a day (TID) | ORAL | Status: DC | PRN

## 2021-12-01 MED ORDER — DEXAMETHASONE 4 MG PO TABS
2.0000 mg | ORAL_TABLET | Freq: Every day | ORAL | Status: DC
  Filled 2021-12-01: qty 1

## 2021-12-01 MED ORDER — ONDANSETRON HCL 4 MG PO TABS
4.0000 mg | ORAL_TABLET | Freq: Three times a day (TID) | ORAL | Status: DC | PRN
  Administered 2021-12-05: 4 mg via ORAL
  Filled 2021-12-01: qty 1

## 2021-12-01 MED ORDER — IRBESARTAN 150 MG PO TABS
150.0000 mg | ORAL_TABLET | Freq: Every day | ORAL | Status: DC
  Administered 2021-12-02 – 2021-12-07 (×5): 150 mg via ORAL
  Filled 2021-12-01 (×5): qty 1

## 2021-12-01 MED ORDER — GUAIFENESIN-DM 100-10 MG/5ML PO SYRP: 10.0000 mL | ORAL_SOLUTION | ORAL | Status: DC | PRN

## 2021-12-01 MED ORDER — SODIUM BICARBONATE 8.4 % IV SOLN
INTRAVENOUS | Status: DC
  Filled 2021-12-01: qty 150
  Filled 2021-12-01 (×2): qty 1000
  Filled 2021-12-01: qty 150
  Filled 2021-12-01 (×4): qty 1000
  Filled 2021-12-01 (×4): qty 150
  Filled 2021-12-01: qty 1000
  Filled 2021-12-01 (×3): qty 150
  Filled 2021-12-01: qty 1000
  Filled 2021-12-01: qty 150

## 2021-12-01 MED ORDER — SENNOSIDES-DOCUSATE SODIUM 8.6-50 MG PO TABS
1.0000 | ORAL_TABLET | Freq: Every evening | ORAL | Status: DC | PRN
  Administered 2021-12-03: 1 via ORAL
  Filled 2021-12-01: qty 1

## 2021-12-01 MED ORDER — ALUM & MAG HYDROXIDE-SIMETH 200-200-20 MG/5ML PO SUSP
30.0000 mL | ORAL | Status: DC | PRN
  Administered 2021-12-02 – 2021-12-03 (×2): 30 mL via ORAL
  Filled 2021-12-01: qty 30

## 2021-12-01 MED ORDER — LIOTHYRONINE SODIUM 5 MCG PO TABS
5.0000 ug | ORAL_TABLET | Freq: Two times a day (BID) | ORAL | Status: DC
  Administered 2021-12-01 – 2021-12-07 (×11): 5 ug via ORAL
  Filled 2021-12-01 (×13): qty 1

## 2021-12-01 MED ORDER — SODIUM CHLORIDE 0.9 % IV SOLN
8.0000 mg | Freq: Three times a day (TID) | INTRAVENOUS | Status: DC | PRN
  Filled 2021-12-01: qty 4

## 2021-12-01 MED ORDER — ACETAMINOPHEN 325 MG PO TABS
650.0000 mg | ORAL_TABLET | ORAL | Status: DC | PRN
  Administered 2021-12-01 – 2021-12-06 (×4): 650 mg via ORAL
  Filled 2021-12-01 (×4): qty 2

## 2021-12-01 NOTE — H&P (Signed)
[facility] [place], [place] [address] [phone]   Admission Note  Date of Service: 09/16/21 Patient Name: Daisy Oliver Patient MRN: [id number] Patient DOB: [birth year] Provider: [name], MD  Identifying Statement:  Daisy Oliver is a 86 y.o. female with right frontal  CNS lymphoma  who presents for first cycle of high dose methotrexate, rituximab.  Referring Provider: No referring provider defined for this encounter.  Oncologic History: Oncology History  Primary CNS lymphoma ([place])  09/02/2021 Surgery   Stereotactic biopsy by Dr. Marcello Moores; path is B-cell lymphoma CD20+   09/30/2021 -  Chemotherapy   Patient is on Treatment Plan : IP NON-HODGKINS LYMPHOMA High Dose Methotrexate + Leucovorin Rescue     10/02/2021 -  Chemotherapy   Patient is on Treatment Plan : NON-HODGKINS LYMPHOMA Rituximab q21d       Biomarkers:  CD20 positive .  Ki-67 50% .   Unknown   Unknown   History of Present Illness: Calie[place] Today she presents for cycle #3 of high dose IV methotrexate.  Has formally stopped decadron.  No other new or progressive changes otherwise.  Continues to have pain in left knee and leg as prior.  Patient presented to medical attention in May 2023 with several weeks of progressive gait instability.  She describes feeling unsteady on her feet, even falling "a couple of times".  After one of the falls, ED visit led to CNS imaging which demonstrated multifocal masses in the right hemisphere.  She underwent stereotactic biopsy with Dr. Marcello Moores on 09/02/21; path demonstrated diffuse large B-cell lymphoma, CD20+.  Since surgery, she has tapered off the steroids without issue.  She feels "fairly normal" aside from pain in her knee and lower back.  She uses a cane to walk, mainly because of the knee pain.  No issues with frank weakness, no seizures.  She lives at home with her husband who has dementia.   Daughter is nearby in [place].    Medications: No current facility-administered medications on file prior to encounter.   Current Outpatient Medications on File Prior to Encounter  Medication Sig Dispense Refill   acetaminophen (TYLENOL) 325 MG tablet Take 650 mg by mouth every 6 (six) hours as needed (for pain).     Ascorbic Acid (VITAMIN C PO) Take 1 tablet by mouth 2 (two) times daily.     BIOTIN PO Take 1 tablet by mouth 2 (two) times daily.     Emollient (CERAVE HEALING) OINT Apply 1 Application topically 2 (two) times daily as needed (wound care/dry skin).     Famotidine (PEPCID PO) Take 20 mg by mouth at bedtime as needed (acid reflux/heartburn).     liothyronine (CYTOMEL) 5 MCG tablet Take 2 tablets (10 mcg total) by mouth See admin instructions. Take 10 mcg before breakfast. (Patient taking differently: Take 10 mcg by mouth See admin instructions. Take two tablets (10 mcg) by mouth daily before breakfast and lunch) 180 tablet 3   MAGNESIUM PO Take 1 capsule by mouth 2 (two) times daily.     Melatonin 10 MG TABS Take 10 mg by mouth at bedtime.     Menthol, Topical Analgesic, (BIOFREEZE EX) Apply 1 Application topically 2 (two) times daily as needed (leg pain).     Misc Natural Products (OSTEO BI-FLEX JOINT SHIELD PO) Take 1 tablet by mouth 2 (two) times daily.     Multiple Vitamin (MULTIVITAMIN WITH MINERALS) TABS tablet Take  1 tablet by mouth 2 (two) times daily.     Multiple Vitamins-Minerals (ZINC PO) Take 1 tablet by mouth 2 (two) times daily.     olmesartan (BENICAR) 20 MG tablet Take 1/2 tablet  Daily  for BP (Patient taking differently: Take 20 mg by mouth every morning.   for BP) 45 tablet 3   Polyethyl Glycol-Propyl Glycol (SYSTANE) 0.4-0.3 % GEL ophthalmic gel Place 1 Application into both eyes at bedtime.     Polyethyl Glycol-Propyl Glycol (SYSTANE) 0.4-0.3 % SOLN Place 1 drop into both eyes every morning.     Pramox-PE-Glycerin-Petrolatum (PREPARATION H) 1-0.25-14.4-15 %  CREA Apply 1 Application topically 2 (two) times daily as needed (hemorrhoid pain).     TAURINE PO Take 1 capsule by mouth 2 (two) times daily.     traMADol (ULTRAM) 50 MG tablet Take 25 mg by mouth at bedtime.     Zinc Oxide 40 % PSTE Apply 1 Application topically 2 (two) times daily as needed (wound care/barrier cream).      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 Herbert Deaner   FRAMELESS  BIOPSY WITH BRAINLAB Right 09/02/2021   Procedure: FRONTAL STEREOTACTIC BRAIN BIOPSY;  Surgeon: Vallarie Mare, MD;  Location: [facility];  Service: Neurosurgery;  Laterality: Right;   IR IMAGING GUIDED PORT INSERTION  09/24/2021   RADIAL HEAD ARTHROPLASTY Right 09/16/2012   Procedure: RADIAL HEAD ARTHROPLASTY;  Surgeon: Johnny Bridge, MD;  Location: [facility];  Service: Orthopedics;  Laterality: Right;  RADIAL HEAD REPLACEMENT     Social History:  Social History   Socioeconomic History   Marital status: Married    Spouse name: Not on  file   Number of children: 2   Years of education: Not on file   Highest education level: Not on file  Occupational History   Not on file  Tobacco Use   Smoking status: Former    Packs/day: 2.00    Years: 22.00    Total pack years: 44.00    Types: Cigarettes    Quit date: 09/17/1983    Years since quitting: 38.2   Smokeless tobacco: Never  Vaping Use   Vaping Use: Never used  Substance and Sexual Activity   Alcohol use: Not Currently    Alcohol/week: 0.0 - 1.0 standard drinks of alcohol    Comment: 2/month   Drug use: No   Sexual activity: Not Currently  Other Topics Concern  Not on file  Social History Narrative   Not on file   Social Determinants of Health   Financial Resource Strain: Not on file  Food Insecurity: Not on file  Transportation Needs: Not on file  Physical Activity: Sufficiently Active (09/14/2017)   Exercise Vital Sign    Days of Exercise per Week: 5 days    Minutes of Exercise per Session: 60 min  Stress: No Stress Concern Present (09/14/2017)   [place]    Feeling of Stress : Only a little  Social Connections: Not on file  Intimate Partner Violence: Not on file   Family History:  Family History  Problem Relation Age of Onset   Heart disease Mother    Brain cancer Father 19       Brain tumor, not cancerous   Hypertension Sister    Osteoporosis Sister    Arthritis Sister    Scoliosis Sister    Hypertension Sister    Osteoporosis Sister    Cancer Paternal Grandfather    Suicidality Paternal Grandfather    Breast cancer Neg Hx     Review of Systems: Constitutional: Doesn't report fevers, chills or abnormal weight loss Eyes: Doesn't report blurriness of vision Ears, nose, mouth, throat, and face: Doesn't report sore throat Respiratory: Doesn't report cough, dyspnea or wheezes Cardiovascular: Doesn't report palpitation, chest discomfort  Gastrointestinal:  Doesn't report  nausea, constipation, diarrhea GU: Doesn't report incontinence Skin: Doesn't report skin rashes Neurological: Per HPI Musculoskeletal: Doesn't report joint pain Behavioral/Psych: Doesn't report anxiety  Physical Exam: Vitals:   12/01/21 1358  BP: (!) 96/55  Pulse: 71  Resp: 16  Temp: 98.3 F (36.8 C)  SpO2: 97%    KPS: 80. General: Alert, cooperative, pleasant, in no acute distress Head: Normal EENT: No conjunctival injection or scleral icterus.  Lungs: Resp effort normal Cardiac: Regular rate Abdomen: Non-distended abdomen Skin: No rashes cyanosis or petechiae. Extremities: No clubbing or edema  Neurologic Exam: Mental Status: Awake, alert, attentive to examiner. Oriented to self and environment. Language is fluent with intact comprehension.  Cranial Nerves: Visual acuity is grossly normal. Visual fields are full. Extra-ocular movements intact. No ptosis. Face is symmetric Motor: Tone and bulk are normal. Power is full in both arms and legs. Reflexes are symmetric, no pathologic reflexes present.  Sensory: Intact to light touch Gait: Orthopedic limitation, cane assisted   Labs: I have reviewed the data as listed    Component Value Date/Time   NA 140 11/05/2021 1619   K 3.8 11/05/2021 1619   CL 102 11/05/2021 1619   CO2 27 11/05/2021 1619   GLUCOSE 84 11/05/2021 1619   BUN 23 11/05/2021 1619   CREATININE 0.85 11/05/2021 1619   CALCIUM 9.3 11/05/2021 1619   PROT 7.1 11/05/2021 1619   ALBUMIN 2.5 (L) 10/31/2021 0500   AST 26 11/05/2021 1619   ALT 70 (H) 11/05/2021 1619   ALKPHOS 32 (L) 10/31/2021 0500   BILITOT 0.8 11/05/2021 1619   GFRNONAA 58 (L) 10/31/2021 0500   GFRNONAA 66 09/19/2020 0000   GFRAA 76 09/19/2020 0000   Lab Results  Component Value Date   WBC 7.9 11/05/2021   NEUTROABS 5,269 11/05/2021   HGB 12.6 11/05/2021   HCT 36.4 11/05/2021   MCV 95.3 11/05/2021   PLT 143 11/05/2021    Pathology: SURGICAL PATHOLOGY  CASE: [id number]   PATIENT: Venicia Mach  Surgical Pathology Report   Clinical History: brain tumor (cm)   FINAL  MICROSCOPIC DIAGNOSIS:   A. BRAIN TUMOR, RIGHT FRONTAL LOBE, BIOPSY:  -  Diffuse large B-cell lymphoma.  B. BRAIN TUMOR, RIGHT FRONTAL LOBE, BIOPSY:  -  Diffuse large B-cell lymphoma.   Note: The cells of interest consist of large cells with prominent small  nuclear membrane bound nucleoli and apparent scant cytoplasm.  Immunohistochemical stains with appropriate controls were performed on  both parts A and B given the morphologic differences (likely frozen  section artifact).  The cells of interest are positive for CD20 and PAX5  consistent with B-cell origin.  GFAP highlights background brain  parenchyma.  These same cells coexpress MUM1 (greater than 50%) BCL6 and  CD5.  CD10 is essentially negative.  Bcl-2 is equivocal given high  background but interpreted as likely positive.  CD3 highlights  background small T cells.  The proliferation rate by Ki-67 is  approximately 50%.  CD30 is negative.  EBER ISH is negative.  P53 shows  scattered positive cells of uncertain clinical significance.  Overall  the findings are consistent with a diffuse large B-cell lymphoma; if a  systemic work-up is negative for lymphoma, the findings would be  consistent with a primary CNS lymphoma.  The immunophenotype is most  consistent with a post germinal center/activated immunophenotype.  There  is sufficient material present for further FISH/molecular work-up if  clinically indicated (please call office for add-on testing if  necessary).   Dr. Gari Crown has peer reviewed the case and agrees with the interpretation.   INTRAOPERATIVE DIAGNOSIS:   A. Brain Tumor, Right Frontal Lobe, Biopsy: "High grade astrocytoma."  Intraoperative diagnosis rendered by Dr. Thornell Sartorius at 14:55 on 02 Sep 2021.    Assessment/Plan Primary CNS Lymphoma  -Once urine alkalinized and labs reviewed, appropriate to proceed with C3  of high dose MTX with leucovorin rescue inpatient -MTX-R therapy C3D1 hopefully tomorrow 12/02/21: Methotrexate 3.5g/m2 given over 4 hours once urine pH >= 7.0 -Leucovorin 62m q6 hours 24 hours after MTX infusion   -Rituxan can be given 24-48 hours after MTX infusion -To start once electrolytes stabilized: IV fluids, dextrose with 1563m sodium bicarbonate at 12570mr.  Need to adjust rate to urine pH >= 7 -daily labs with cbc, cmp -daily urine pH once it reaches target pH of >7 -daily MTX levels following MTX infusion until MTX levels <0.10 -continue same supportive medications with exception of protonix which interferes with clearance of MTX  -LFTs and renal function both normal at this time   May continue home meds as prior, we will hold vitamins and supplements during the admission.  The total time spent in the encounter was 60 minutes and more than 50% was on counseling and review of test results  Please call/text/page with any clinical concerns, updates.  ZacVentura SellersD Medical Director of Neuro-Oncology [facility] WesBaldwin/06/23 12:43 PM

## 2021-12-02 DIAGNOSIS — C8589 Other specified types of non-Hodgkin lymphoma, extranodal and solid organ sites: Secondary | ICD-10-CM | POA: Diagnosis not present

## 2021-12-02 LAB — CBC WITH DIFFERENTIAL/PLATELET
Abs Immature Granulocytes: 0.02 10*3/uL (ref 0.00–0.07)
Basophils Absolute: 0.1 10*3/uL (ref 0.0–0.1)
Basophils Relative: 1 %
Eosinophils Absolute: 0.4 10*3/uL (ref 0.0–0.5)
Eosinophils Relative: 6 %
HCT: 31.7 % — ABNORMAL LOW (ref 36.0–46.0)
Hemoglobin: 10.9 g/dL — ABNORMAL LOW (ref 12.0–15.0)
Immature Granulocytes: 0 %
Lymphocytes Relative: 26 %
Lymphs Abs: 1.5 10*3/uL (ref 0.7–4.0)
MCH: 33.6 pg (ref 26.0–34.0)
MCHC: 34.4 g/dL (ref 30.0–36.0)
MCV: 97.8 fL (ref 80.0–100.0)
Monocytes Absolute: 0.8 10*3/uL (ref 0.1–1.0)
Monocytes Relative: 13 %
Neutro Abs: 3.1 10*3/uL (ref 1.7–7.7)
Neutrophils Relative %: 54 %
Platelets: 139 10*3/uL — ABNORMAL LOW (ref 150–400)
RBC: 3.24 MIL/uL — ABNORMAL LOW (ref 3.87–5.11)
RDW: 13.6 % (ref 11.5–15.5)
WBC: 5.9 10*3/uL (ref 4.0–10.5)
nRBC: 0 % (ref 0.0–0.2)

## 2021-12-02 LAB — COMPREHENSIVE METABOLIC PANEL
ALT: 20 U/L (ref 0–44)
AST: 22 U/L (ref 15–41)
Albumin: 3 g/dL — ABNORMAL LOW (ref 3.5–5.0)
Alkaline Phosphatase: 36 U/L — ABNORMAL LOW (ref 38–126)
Anion gap: 5 (ref 5–15)
BUN: 16 mg/dL (ref 8–23)
CO2: 34 mmol/L — ABNORMAL HIGH (ref 22–32)
Calcium: 8.9 mg/dL (ref 8.9–10.3)
Chloride: 101 mmol/L (ref 98–111)
Creatinine, Ser: 0.72 mg/dL (ref 0.44–1.00)
GFR, Estimated: >60 mL/min (ref >60)
Glucose, Bld: 94 mg/dL (ref 70–99)
Potassium: 3 mmol/L — ABNORMAL LOW (ref 3.5–5.1)
Sodium: 140 mmol/L (ref 135–145)
Total Bilirubin: 0.8 mg/dL (ref 0.3–1.2)
Total Protein: 5.6 g/dL — ABNORMAL LOW (ref 6.5–8.1)

## 2021-12-02 LAB — URINALYSIS, ROUTINE W REFLEX MICROSCOPIC
Bilirubin Urine: NEGATIVE
Glucose, UA: NEGATIVE mg/dL
Hgb urine dipstick: NEGATIVE
Ketones, ur: NEGATIVE mg/dL
Leukocytes,Ua: NEGATIVE
Nitrite: NEGATIVE
Protein, ur: NEGATIVE mg/dL
Specific Gravity, Urine: 1.008 (ref 1.005–1.030)
pH: 8 (ref 5.0–8.0)

## 2021-12-02 MED ORDER — POTASSIUM CHLORIDE 10 MEQ/100ML IV SOLN
10.0000 meq | Freq: Once | INTRAVENOUS | Status: AC
  Administered 2021-12-02: 10 meq via INTRAVENOUS
  Filled 2021-12-02: qty 100

## 2021-12-02 MED ORDER — CHLORHEXIDINE GLUCONATE CLOTH 2 % EX PADS
6.0000 | MEDICATED_PAD | Freq: Every day | CUTANEOUS | Status: DC
  Administered 2021-12-02 – 2021-12-07 (×6): 6 via TOPICAL

## 2021-12-02 MED ORDER — POTASSIUM CHLORIDE CRYS ER 20 MEQ PO TBCR
40.0000 meq | EXTENDED_RELEASE_TABLET | Freq: Once | ORAL | Status: AC
  Administered 2021-12-02: 40 meq via ORAL
  Filled 2021-12-02: qty 2

## 2021-12-02 MED ORDER — METHOTREXATE SODIUM (PF) CHEMO INJECTION 1 GM/40ML
3.5000 g/m2 | Freq: Once | INTRAVENOUS | Status: AC
  Administered 2021-12-02: 6.265 g via INTRAVENOUS
  Filled 2021-12-02: qty 250.6

## 2021-12-02 MED ORDER — SODIUM CHLORIDE 0.9 % IV SOLN
Freq: Once | INTRAVENOUS | Status: AC
  Administered 2021-12-02: 20 mg via INTRAVENOUS
  Filled 2021-12-02: qty 8

## 2021-12-02 NOTE — Progress Notes (Signed)
[facility] [place], [place] [address] [phone]   Progress Note  Date of Service: 09/16/21 Patient Name: Daisy Oliver Patient MRN: [id number] Patient DOB: 1935/09/27 Provider: [name], MD  Identifying Statement:  Daisy Oliver is a 86 y.o. female with right frontal  CNS lymphoma  who presents for first cycle of high dose methotrexate, rituximab.  Referring Provider: [name], MD [place],  [place] [address]  Oncologic History: Oncology History  Primary CNS lymphoma [facility])  09/02/2021 Surgery   Stereotactic biopsy by Dr. Marcello Moores; path is B-cell lymphoma CD20+   09/30/2021 -  Chemotherapy   Patient is on Treatment Plan : IP NON-HODGKINS LYMPHOMA High Dose Methotrexate + Leucovorin Rescue     10/02/2021 -  Chemotherapy   Patient is on Treatment Plan : NON-HODGKINS LYMPHOMA Rituximab q21d       Biomarkers:  CD20 positive .  Ki-67 50% .   Unknown   Unknown   Interval History: Daisy Oliver has no new complaints.  No issues with MTX infusion.  Patient presented to medical attention in May 2023 with several weeks of progressive gait instability.  She describes feeling unsteady on her feet, even falling "a couple of times".  After one of the falls, ED visit led to CNS imaging which demonstrated multifocal masses in the right hemisphere.  She underwent stereotactic biopsy with Dr. Marcello Moores on 09/02/21; path demonstrated diffuse large B-cell lymphoma, CD20+.  Since surgery, she has tapered off the steroids without issue.  She feels "fairly normal" aside from pain in her knee and lower back.  She uses a cane to walk, mainly because of the knee pain.  No issues with frank weakness, no seizures.  She lives at home with her husband who has dementia.  Daughter is nearby in [place].    Medications: No current facility-administered medications on file prior to encounter.   Current  Outpatient Medications on File Prior to Encounter  Medication Sig Dispense Refill   acetaminophen (TYLENOL) 500 MG tablet Take 500 mg by mouth 2 (two) times daily as needed (pain).     Ascorbic Acid (VITAMIN C PO) Take 1 tablet by mouth daily.     BIOTIN PO Take 1 tablet by mouth 2 (two) times daily.     famotidine (PEPCID) 20 MG tablet Take 20 mg by mouth at bedtime as needed for indigestion.     liothyronine (CYTOMEL) 5 MCG tablet Take 2 tablets (10 mcg total) by mouth See admin instructions. Take 10 mcg before breakfast. (Patient taking differently: Take 5 mcg by mouth in the morning and at bedtime.) 180 tablet 3   MAGNESIUM PO Take 1 capsule by mouth 2 (two) times daily.     Melatonin 10 MG TABS Take 10 mg by mouth at bedtime.     Menthol, Topical Analgesic, (BIOFREEZE EX) Apply 1 Application topically at bedtime as needed (leg pain).     Menthol, Topical Analgesic, (BIOFREEZE) 5 % PTCH Apply 1 patch topically as needed (back pain).     Misc Natural Products (OSTEO BI-FLEX JOINT SHIELD PO) Take 1 tablet by mouth 2 (two) times daily.     Multiple Vitamin (MULTIVITAMIN WITH MINERALS) TABS tablet Take 1 tablet by mouth 2 (two) times daily.     Multiple Vitamins-Minerals (ZINC PO) Take 1 tablet by mouth 2 (two) times daily.     nystatin cream (MYCOSTATIN) Apply 1 Application topically as needed  for dry skin (fever blisters).     olmesartan (BENICAR) 20 MG tablet Take 1/2 tablet  Daily  for BP (Patient taking differently: Take 10 mg by mouth in the morning and at bedtime.) 45 tablet 3   Polyethyl Glycol-Propyl Glycol (SYSTANE) 0.4-0.3 % GEL ophthalmic gel Place 1 Application into both eyes at bedtime.     Polyethyl Glycol-Propyl Glycol (SYSTANE) 0.4-0.3 % SOLN Place 1 drop into both eyes every morning.     TAURINE PO Take 1 capsule by mouth daily.     Emollient (CERAVE HEALING) OINT Apply 1 Application topically 2 (two) times daily as needed (wound care/dry skin). (Patient not taking: Reported on  12/01/2021)     leucovorin (WELLCOVORIN) 5 MG tablet Take by mouth every 6 (six) hours. (Patient not taking: Reported on 12/01/2021)     Pramox-PE-Glycerin-Petrolatum (PREPARATION H) 1-0.25-14.4-15 % CREA Apply 1 Application topically 2 (two) times daily as needed (hemorrhoid pain). (Patient not taking: Reported on 12/01/2021)     traMADol (ULTRAM) 50 MG tablet Take 25 mg by mouth at bedtime. (Patient not taking: Reported on 12/01/2021)     Zinc Oxide 40 % PSTE Apply 1 Application topically 2 (two) times daily as needed (wound care/barrier cream). 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 Herbert Deaner   FRAMELESS  BIOPSY WITH BRAINLAB Right 09/02/2021   Procedure: FRONTAL STEREOTACTIC BRAIN BIOPSY;  Surgeon: Vallarie Mare, MD;  Location: [facility];  Service: Neurosurgery;  Laterality: Right;   IR IMAGING GUIDED PORT INSERTION  09/24/2021   RADIAL HEAD ARTHROPLASTY Right 09/16/2012    Procedure: RADIAL HEAD ARTHROPLASTY;  Surgeon: Johnny Bridge, MD;  Location: [facility];  Service: Orthopedics;  Laterality: Right;  RADIAL HEAD REPLACEMENT     Social History:  Social History   Socioeconomic History   Marital status: Married    Spouse name: Not on file   Number of children: 2   Years of education: Not on file   Highest education level: Not on file  Occupational History   Not on file  Tobacco Use   Smoking status: Former    Packs/day: 2.00    Years: 22.00    Total pack years: 44.00    Types: Cigarettes    Quit date: 09/17/1983    Years since quitting: 38.2   Smokeless tobacco: Never  Vaping Use   Vaping  Use: Never used  Substance and Sexual Activity   Alcohol use: Not Currently    Alcohol/week: 0.0 - 1.0 standard drinks of alcohol    Comment: 2/month   Drug use: No   Sexual activity: Not Currently  Other Topics Concern   Not on file  Social History Narrative   Not on file   Social Determinants of Health   Financial Resource Strain: Not on file  Food Insecurity: Not on file  Transportation Needs: Not on file  Physical Activity: Sufficiently Active (09/14/2017)   Exercise Vital Sign    Days of Exercise per Week: 5 days    Minutes of Exercise per Session: 60 min  Stress: No Stress Concern Present (09/14/2017)   Alex    Feeling of Stress : Only a little  Social Connections: Not on file  Intimate Partner Violence: Not on file   Family History:  Family History  Problem Relation Age of Onset   Heart disease Mother    Brain cancer Father 62       Brain tumor, not cancerous   Hypertension Sister    Osteoporosis Sister    Arthritis Sister    Scoliosis Sister    Hypertension Sister    Osteoporosis Sister    Cancer Paternal Grandfather    Suicidality Paternal Grandfather    Breast cancer Neg Hx     Review of Systems: Constitutional: Doesn't report fevers, chills  or abnormal weight loss Eyes: Doesn't report blurriness of vision Ears, nose, mouth, throat, and face: Doesn't report sore throat Respiratory: Doesn't report cough, dyspnea or wheezes Cardiovascular: Doesn't report palpitation, chest discomfort  Gastrointestinal:  Doesn't report nausea, constipation, diarrhea GU: Doesn't report incontinence Skin: Doesn't report skin rashes Neurological: Per HPI Musculoskeletal: Doesn't report joint pain Behavioral/Psych: Doesn't report anxiety  Physical Exam: Vitals:   12/02/21 1014 12/02/21 1343  BP: 125/70 (!) 119/59  Pulse: 79 62  Resp: 18 16  Temp:  98.2 F (36.8 C)  SpO2:  (!) 89%    KPS: 80. General: Alert, cooperative, pleasant, in no acute distress Head: Normal EENT: No conjunctival injection or scleral icterus.  Lungs: Resp effort normal Cardiac: Regular rate Abdomen: Non-distended abdomen Skin: No rashes cyanosis or petechiae. Extremities: No clubbing or edema  Neurologic Exam: Mental Status: Awake, alert, attentive to examiner. Oriented to self and environment. Language is fluent with intact comprehension.  Cranial Nerves: Visual acuity is grossly normal. Visual fields are full. Extra-ocular movements intact. No ptosis. Face is symmetric Motor: Tone and bulk are normal. Power is full in both arms and legs. Reflexes are symmetric, no pathologic reflexes present.  Sensory: Intact to light touch Gait: Orthopedic limitation, cane assisted   Labs: I have reviewed the data as listed    Component Value Date/Time   NA 140 12/02/2021 0605   K 3.0 (L) 12/02/2021 0605   CL 101 12/02/2021 0605   CO2 34 (H) 12/02/2021 0605   GLUCOSE 94 12/02/2021 0605   BUN 16 12/02/2021 0605   CREATININE 0.72 12/02/2021 0605   CREATININE 0.85 11/05/2021 1619   CALCIUM 8.9 12/02/2021 0605   PROT 5.6 (L) 12/02/2021 0605   ALBUMIN 3.0 (L) 12/02/2021 0605   AST 22 12/02/2021 0605   ALT 20 12/02/2021 0605   ALKPHOS 36 (L) 12/02/2021 0605   BILITOT  0.8 12/02/2021 0605   GFRNONAA >60 12/02/2021 0605   GFRNONAA 66 09/19/2020 0000   GFRAA 76 09/19/2020 0000   Lab  Results  Component Value Date   WBC 5.9 12/02/2021   NEUTROABS 3.1 12/02/2021   HGB 10.9 (L) 12/02/2021   HCT 31.7 (L) 12/02/2021   MCV 97.8 12/02/2021   PLT 139 (L) 12/02/2021    Pathology: SURGICAL PATHOLOGY  CASE: [id number]  PATIENT: Daisy Oliver  Surgical Pathology Report   Clinical History: brain tumor (cm)   FINAL MICROSCOPIC DIAGNOSIS:   A. BRAIN TUMOR, RIGHT FRONTAL LOBE, BIOPSY:  -  Diffuse large B-cell lymphoma.  B. BRAIN TUMOR, RIGHT FRONTAL LOBE, BIOPSY:  -  Diffuse large B-cell lymphoma.   Note: The cells of interest consist of large cells with prominent small  nuclear membrane bound nucleoli and apparent scant cytoplasm.  Immunohistochemical stains with appropriate controls were performed on  both parts A and B given the morphologic differences (likely frozen  section artifact).  The cells of interest are positive for CD20 and PAX5  consistent with B-cell origin.  GFAP highlights background brain  parenchyma.  These same cells coexpress MUM1 (greater than 50%) BCL6 and  CD5.  CD10 is essentially negative.  Bcl-2 is equivocal given high  background but interpreted as likely positive.  CD3 highlights  background small T cells.  The proliferation rate by Ki-67 is  approximately 50%.  CD30 is negative.  EBER ISH is negative.  P53 shows  scattered positive cells of uncertain clinical significance.  Overall  the findings are consistent with a diffuse large B-cell lymphoma; if a  systemic work-up is negative for lymphoma, the findings would be  consistent with a primary CNS lymphoma.  The immunophenotype is most  consistent with a post germinal center/activated immunophenotype.  There  is sufficient material present for further FISH/molecular work-up if  clinically indicated (please call office for add-on testing if  necessary).   Dr. Gari Crown has  peer reviewed the case and agrees with the interpretation.   INTRAOPERATIVE DIAGNOSIS:   A. Brain Tumor, Right Frontal Lobe, Biopsy: "High grade astrocytoma."  Intraoperative diagnosis rendered by Dr. Thornell Sartorius at 14:55 on 02 Sep 2021.    Assessment/Plan Primary CNS Lymphoma  12/02/21: -MTX infused today -Con't IVF at 125/hr -MTX level tmrw AM -Leucovorin to start 24h post infusion  -Once urine alkalinized and labs reviewed, appropriate to proceed with C3 of high dose MTX with leucovorin rescue inpatient -MTX-R therapy C3D1 hopefully tomorrow 12/02/21: Methotrexate 3.5g/m2 given over 4 hours once urine pH >= 7.0 -Leucovorin 77m q6 hours 24 hours after MTX infusion   -Rituxan can be given 24-48 hours after MTX infusion -To start once electrolytes stabilized: IV fluids, dextrose with 1554m sodium bicarbonate at 12557mr.  Need to adjust rate to urine pH >= 7 -daily labs with cbc, cmp -daily urine pH once it reaches target pH of >7 -daily MTX levels following MTX infusion until MTX levels <0.10 -continue same supportive medications with exception of protonix which interferes with clearance of MTX  -LFTs and renal function both normal at this time   May continue home meds as prior, we will hold vitamins and supplements during the admission.  The total time spent in the encounter was 30 minutes and more than 50% was on counseling and review of test results  Please call/text/page with any clinical concerns, updates.  ZacVentura SellersD Medical Director of Neuro-Oncology [facility] WesGoodrich/06/23 4:44 PM

## 2021-12-02 NOTE — Progress Notes (Signed)
Patient received Methotrexate today. VSS throughout treatment. Patient tolerated treatment well with no issues. Patents Nurse was given report before leaving the unit. Patient has no questions or concerns at this time.

## 2021-12-02 NOTE — Plan of Care (Signed)
  Problem: Education: Goal: Knowledge of General Education information will improve Description: Including pain rating scale, medication(s)/side effects and non-pharmacologic comfort measures Outcome: Progressing   Problem: Health Behavior/Discharge Planning: Goal: Ability to manage health-related needs will improve Outcome: Progressing   Problem: Clinical Measurements: Goal: Ability to maintain clinical measurements within normal limits will improve Outcome: Progressing Goal: Will remain free from infection Outcome: Progressing Goal: Diagnostic test results will improve Outcome: Progressing Goal: Respiratory complications will improve Outcome: Progressing Goal: Cardiovascular complication will be avoided Outcome: Progressing   Problem: Activity: Goal: Risk for activity intolerance will decrease Outcome: Progressing   Problem: Nutrition: Goal: Adequate nutrition will be maintained Outcome: Progressing   Problem: Coping: Goal: Level of anxiety will decrease Outcome: Progressing   Problem: Elimination: Goal: Will not experience complications related to bowel motility Outcome: Progressing Goal: Will not experience complications related to urinary retention Outcome: Progressing   Problem: Pain Managment: Goal: General experience of comfort will improve Outcome: Progressing   Problem: Safety: Goal: Ability to remain free from injury will improve Outcome: Progressing   Problem: Skin Integrity: Goal: Risk for impaired skin integrity will decrease Outcome: Progressing   Problem: Education: Goal: Knowledge of the prescribed therapeutic regimen will improve Outcome: Progressing   Problem: Activity: Goal: Ability to implement measures to reduce episodes of fatigue will improve Outcome: Progressing   Problem: Bowel/Gastric: Goal: Will not experience complications related to bowel motility Outcome: Progressing   Problem: Coping: Goal: Ability to identify and develop  effective coping behavior will improve Outcome: Progressing   Problem: Nutritional: Goal: Maintenance of adequate nutrition will improve Outcome: Progressing   

## 2021-12-03 LAB — CBC WITH DIFFERENTIAL/PLATELET
Abs Immature Granulocytes: 0.04 10*3/uL (ref 0.00–0.07)
Basophils Absolute: 0 10*3/uL (ref 0.0–0.1)
Basophils Relative: 0 %
Eosinophils Absolute: 0 10*3/uL (ref 0.0–0.5)
Eosinophils Relative: 0 %
HCT: 30.3 % — ABNORMAL LOW (ref 36.0–46.0)
Hemoglobin: 10.6 g/dL — ABNORMAL LOW (ref 12.0–15.0)
Immature Granulocytes: 0 %
Lymphocytes Relative: 6 %
Lymphs Abs: 0.6 10*3/uL — ABNORMAL LOW (ref 0.7–4.0)
MCH: 33.5 pg (ref 26.0–34.0)
MCHC: 35 g/dL (ref 30.0–36.0)
MCV: 95.9 fL (ref 80.0–100.0)
Monocytes Absolute: 0.8 10*3/uL (ref 0.1–1.0)
Monocytes Relative: 8 %
Neutro Abs: 8.6 10*3/uL — ABNORMAL HIGH (ref 1.7–7.7)
Neutrophils Relative %: 86 %
Platelets: 146 10*3/uL — ABNORMAL LOW (ref 150–400)
RBC: 3.16 MIL/uL — ABNORMAL LOW (ref 3.87–5.11)
RDW: 13 % (ref 11.5–15.5)
WBC: 10.1 10*3/uL (ref 4.0–10.5)
nRBC: 0 % (ref 0.0–0.2)

## 2021-12-03 LAB — URINALYSIS, ROUTINE W REFLEX MICROSCOPIC
Bilirubin Urine: NEGATIVE
Glucose, UA: NEGATIVE mg/dL
Hgb urine dipstick: NEGATIVE
Ketones, ur: NEGATIVE mg/dL
Leukocytes,Ua: NEGATIVE
Nitrite: NEGATIVE
Protein, ur: NEGATIVE mg/dL
Specific Gravity, Urine: 1.009 (ref 1.005–1.030)
pH: 8 (ref 5.0–8.0)

## 2021-12-03 LAB — COMPREHENSIVE METABOLIC PANEL
ALT: 59 U/L — ABNORMAL HIGH (ref 0–44)
AST: 59 U/L — ABNORMAL HIGH (ref 15–41)
Albumin: 2.9 g/dL — ABNORMAL LOW (ref 3.5–5.0)
Alkaline Phosphatase: 37 U/L — ABNORMAL LOW (ref 38–126)
Anion gap: 8 (ref 5–15)
BUN: 19 mg/dL (ref 8–23)
CO2: 34 mmol/L — ABNORMAL HIGH (ref 22–32)
Calcium: 8.7 mg/dL — ABNORMAL LOW (ref 8.9–10.3)
Chloride: 98 mmol/L (ref 98–111)
Creatinine, Ser: 0.92 mg/dL (ref 0.44–1.00)
GFR, Estimated: >60 mL/min (ref >60)
Glucose, Bld: 118 mg/dL — ABNORMAL HIGH (ref 70–99)
Potassium: 3.1 mmol/L — ABNORMAL LOW (ref 3.5–5.1)
Sodium: 140 mmol/L (ref 135–145)
Total Bilirubin: 1.6 mg/dL — ABNORMAL HIGH (ref 0.3–1.2)
Total Protein: 5.5 g/dL — ABNORMAL LOW (ref 6.5–8.1)

## 2021-12-03 LAB — METHOTREXATE: Methotrexate: 21.8

## 2021-12-03 MED ORDER — SODIUM CHLORIDE 0.9 % IV SOLN: Freq: Once | INTRAVENOUS | Status: DC

## 2021-12-03 MED ORDER — SODIUM CHLORIDE 0.9 % IV SOLN: INTRAVENOUS | Status: DC

## 2021-12-03 MED ORDER — LEUCOVORIN CALCIUM INJECTION 100 MG
15.0000 mg | Freq: Four times a day (QID) | INTRAMUSCULAR | Status: AC
  Administered 2021-12-03 – 2021-12-04 (×4): 16 mg via INTRAVENOUS
  Filled 2021-12-03 (×8): qty 0.8

## 2021-12-03 MED ORDER — POTASSIUM CHLORIDE CRYS ER 20 MEQ PO TBCR
40.0000 meq | EXTENDED_RELEASE_TABLET | Freq: Four times a day (QID) | ORAL | Status: AC
  Administered 2021-12-03 (×2): 40 meq via ORAL
  Filled 2021-12-03 (×2): qty 2

## 2021-12-03 MED ORDER — SODIUM BICARBONATE 8.4 % IV SOLN: INTRAVENOUS | Status: DC

## 2021-12-03 MED ORDER — ALTEPLASE 2 MG IJ SOLR: 2.0000 mg | Freq: Once | INTRAMUSCULAR | Status: DC | PRN

## 2021-12-03 MED ORDER — HEPARIN SOD (PORK) LOCK FLUSH 100 UNIT/ML IV SOLN
500.0000 [IU] | Freq: Once | INTRAVENOUS | Status: AC | PRN
  Administered 2021-12-07: 500 [IU]
  Filled 2021-12-03: qty 5

## 2021-12-03 MED ORDER — SODIUM CHLORIDE 0.9% FLUSH: 10.0000 mL | INTRAVENOUS | Status: DC | PRN

## 2021-12-03 MED ORDER — TRAMADOL HCL 50 MG PO TABS
25.0000 mg | ORAL_TABLET | Freq: Once | ORAL | Status: AC
  Administered 2021-12-03: 25 mg via ORAL
  Filled 2021-12-03: qty 1

## 2021-12-03 MED ORDER — SODIUM CHLORIDE 0.9% FLUSH: 3.0000 mL | INTRAVENOUS | Status: DC | PRN

## 2021-12-03 MED ORDER — HEPARIN SOD (PORK) LOCK FLUSH 100 UNIT/ML IV SOLN: 250.0000 [IU] | Freq: Once | INTRAVENOUS | Status: DC | PRN

## 2021-12-03 NOTE — Progress Notes (Signed)
Mobility Specialist - Progress Note   12/03/21 1333  Mobility  Activity Ambulated with assistance in hallway  Level of Assistance Independent after set-up  Assistive Device Cane  Distance Ambulated (ft) 500 ft  Activity Response Tolerated well  $Mobility charge 1 Mobility   Pt received EOB and agreed for mobilization. No c/o dizziness or SOB. Some pain in callus on left foot but pain is consistent w/ pain prior to admission. Pt left in bed with all needs met and call bell within reach.   Roderick Pee Mobility Specialist

## 2021-12-04 ENCOUNTER — Encounter: Payer: Self-pay | Admitting: Internal Medicine

## 2021-12-04 DIAGNOSIS — C8589 Other specified types of non-Hodgkin lymphoma, extranodal and solid organ sites: Secondary | ICD-10-CM | POA: Diagnosis not present

## 2021-12-04 LAB — COMPREHENSIVE METABOLIC PANEL
ALT: 68 U/L — ABNORMAL HIGH (ref 0–44)
AST: 58 U/L — ABNORMAL HIGH (ref 15–41)
Albumin: 3.1 g/dL — ABNORMAL LOW (ref 3.5–5.0)
Alkaline Phosphatase: 42 U/L (ref 38–126)
Anion gap: 9 (ref 5–15)
BUN: 12 mg/dL (ref 8–23)
CO2: 38 mmol/L — ABNORMAL HIGH (ref 22–32)
Calcium: 8.8 mg/dL — ABNORMAL LOW (ref 8.9–10.3)
Chloride: 95 mmol/L — ABNORMAL LOW (ref 98–111)
Creatinine, Ser: 1.05 mg/dL — ABNORMAL HIGH (ref 0.44–1.00)
GFR, Estimated: 52 mL/min — ABNORMAL LOW (ref >60)
Glucose, Bld: 108 mg/dL — ABNORMAL HIGH (ref 70–99)
Potassium: 2.9 mmol/L — ABNORMAL LOW (ref 3.5–5.1)
Sodium: 142 mmol/L (ref 135–145)
Total Bilirubin: 0.9 mg/dL (ref 0.3–1.2)
Total Protein: 5.8 g/dL — ABNORMAL LOW (ref 6.5–8.1)

## 2021-12-04 LAB — CBC WITH DIFFERENTIAL/PLATELET
Abs Immature Granulocytes: 0.03 10*3/uL (ref 0.00–0.07)
Basophils Absolute: 0 10*3/uL (ref 0.0–0.1)
Basophils Relative: 1 %
Eosinophils Absolute: 0.3 10*3/uL (ref 0.0–0.5)
Eosinophils Relative: 4 %
HCT: 33.3 % — ABNORMAL LOW (ref 36.0–46.0)
Hemoglobin: 11.2 g/dL — ABNORMAL LOW (ref 12.0–15.0)
Immature Granulocytes: 0 %
Lymphocytes Relative: 17 %
Lymphs Abs: 1.3 10*3/uL (ref 0.7–4.0)
MCH: 33.2 pg (ref 26.0–34.0)
MCHC: 33.6 g/dL (ref 30.0–36.0)
MCV: 98.8 fL (ref 80.0–100.0)
Monocytes Absolute: 0.4 10*3/uL (ref 0.1–1.0)
Monocytes Relative: 5 %
Neutro Abs: 5.7 10*3/uL (ref 1.7–7.7)
Neutrophils Relative %: 73 %
Platelets: 155 10*3/uL (ref 150–400)
RBC: 3.37 MIL/uL — ABNORMAL LOW (ref 3.87–5.11)
RDW: 13.4 % (ref 11.5–15.5)
WBC: 7.8 10*3/uL (ref 4.0–10.5)
nRBC: 0 % (ref 0.0–0.2)

## 2021-12-04 LAB — URINALYSIS, ROUTINE W REFLEX MICROSCOPIC
Bilirubin Urine: NEGATIVE
Glucose, UA: NEGATIVE mg/dL
Hgb urine dipstick: NEGATIVE
Ketones, ur: NEGATIVE mg/dL
Leukocytes,Ua: NEGATIVE
Nitrite: NEGATIVE
Protein, ur: 30 mg/dL — AB
Specific Gravity, Urine: 1.005 (ref 1.005–1.030)
pH: 9 — ABNORMAL HIGH (ref 5.0–8.0)

## 2021-12-04 LAB — METHOTREXATE: Methotrexate: 1.23

## 2021-12-04 MED ORDER — POTASSIUM CHLORIDE 10 MEQ/100ML IV SOLN
10.0000 meq | INTRAVENOUS | Status: AC
  Administered 2021-12-04 (×4): 10 meq via INTRAVENOUS
  Filled 2021-12-04 (×3): qty 100

## 2021-12-04 MED ORDER — PROCHLORPERAZINE EDISYLATE 10 MG/2ML IJ SOLN
10.0000 mg | Freq: Four times a day (QID) | INTRAMUSCULAR | Status: DC | PRN
  Administered 2021-12-04 (×2): 10 mg via INTRAVENOUS
  Filled 2021-12-04 (×2): qty 2

## 2021-12-04 MED ORDER — LEUCOVORIN CALCIUM INJECTION 100 MG
15.0000 mg | Freq: Four times a day (QID) | INTRAMUSCULAR | Status: AC
  Administered 2021-12-04 – 2021-12-05 (×4): 16 mg via INTRAVENOUS
  Filled 2021-12-04 (×7): qty 0.8

## 2021-12-04 MED ORDER — ACETAMINOPHEN 325 MG PO TABS
650.0000 mg | ORAL_TABLET | Freq: Once | ORAL | Status: AC
  Administered 2021-12-04: 650 mg via ORAL
  Filled 2021-12-04: qty 2

## 2021-12-04 MED ORDER — DIPHENHYDRAMINE HCL 25 MG PO CAPS
50.0000 mg | ORAL_CAPSULE | Freq: Once | ORAL | Status: AC
  Administered 2021-12-04: 50 mg via ORAL
  Filled 2021-12-04: qty 2

## 2021-12-04 MED ORDER — POTASSIUM CHLORIDE CRYS ER 20 MEQ PO TBCR
40.0000 meq | EXTENDED_RELEASE_TABLET | Freq: Once | ORAL | Status: AC
  Administered 2021-12-04: 40 meq via ORAL
  Filled 2021-12-04: qty 2

## 2021-12-04 MED ORDER — SODIUM CHLORIDE 0.9 % IV SOLN
375.0000 mg/m2 | Freq: Once | INTRAVENOUS | Status: AC
  Administered 2021-12-04: 700 mg via INTRAVENOUS
  Filled 2021-12-04: qty 20

## 2021-12-04 MED ORDER — DEXAMETHASONE SODIUM PHOSPHATE 4 MG/ML IJ SOLN
8.0000 mg | Freq: Once | INTRAMUSCULAR | Status: AC
  Administered 2021-12-04: 8 mg via INTRAVENOUS
  Filled 2021-12-04: qty 2

## 2021-12-04 MED ORDER — FAMOTIDINE IN NACL 20-0.9 MG/50ML-% IV SOLN
20.0000 mg | Freq: Once | INTRAVENOUS | Status: AC
  Administered 2021-12-04: 20 mg via INTRAVENOUS
  Filled 2021-12-04: qty 50

## 2021-12-04 NOTE — Progress Notes (Signed)
[facility] [place], [place] [address] [phone]   Progress Note  Date of Service: 09/16/21 Patient Name: Daisy Oliver Patient MRN: [id number] Patient DOB: [birth year] Provider: [name], MD  Identifying Statement:  Daisy Oliver is a 86 y.o. female with right frontal  CNS lymphoma  who presents for first cycle of high dose methotrexate, rituximab.  Referring Provider: [name], MD [place],  [place] [address]  Oncologic History: Oncology History  Primary CNS lymphoma [facility])  09/02/2021 Surgery   Stereotactic biopsy by Dr. Marcello Moores; path is B-cell lymphoma CD20+   09/30/2021 -  Chemotherapy   Patient is on Treatment Plan : IP NON-HODGKINS LYMPHOMA High Dose Methotrexate + Leucovorin Rescue     10/02/2021 -  Chemotherapy   Patient is on Treatment Plan : NON-HODGKINS LYMPHOMA Rituximab q21d       Biomarkers:  CD20 positive .  Ki-67 50% .   Unknown   Unknown   Interval History: Daisy Oliver had several episodes of vomiting this morning, zofran didn't help fully.  She feels better after the Pepcid and compazine.  Plan for rituximab today.  Patient presented to medical attention in May 2023 with several weeks of progressive gait instability.  She describes feeling unsteady on her feet, even falling "a couple of times".  After one of the falls, ED visit led to CNS imaging which demonstrated multifocal masses in the right hemisphere.  She underwent stereotactic biopsy with Dr. Marcello Moores on 09/02/21; path demonstrated diffuse large B-cell lymphoma, CD20+.  Since surgery, she has tapered off the steroids without issue.  She feels "fairly normal" aside from pain in her knee and lower back.  She uses a cane to walk, mainly because of the knee pain.  No issues with frank weakness, no seizures.  She lives at home with her husband who has dementia.  Daughter is nearby in  [place].    Medications: No current facility-administered medications on file prior to encounter.   Current Outpatient Medications on File Prior to Encounter  Medication Sig Dispense Refill   acetaminophen (TYLENOL) 500 MG tablet Take 500 mg by mouth 2 (two) times daily as needed (pain).     Ascorbic Acid (VITAMIN C PO) Take 1 tablet by mouth daily.     BIOTIN PO Take 1 tablet by mouth 2 (two) times daily.     famotidine (PEPCID) 20 MG tablet Take 20 mg by mouth at bedtime as needed for indigestion.     liothyronine (CYTOMEL) 5 MCG tablet Take 2 tablets (10 mcg total) by mouth See admin instructions. Take 10 mcg before breakfast. (Patient taking differently: Take 5 mcg by mouth in the morning and at bedtime.) 180 tablet 3   MAGNESIUM PO Take 1 capsule by mouth 2 (two) times daily.     Melatonin 10 MG TABS Take 10 mg by mouth at bedtime.     Menthol, Topical Analgesic, (BIOFREEZE EX) Apply 1 Application topically at bedtime as needed (leg pain).     Menthol, Topical Analgesic, (BIOFREEZE) 5 % PTCH Apply 1 patch topically as needed (back pain).     Misc Natural Products (OSTEO BI-FLEX JOINT SHIELD PO) Take 1 tablet by mouth 2 (two) times daily.     Multiple Vitamin (MULTIVITAMIN WITH MINERALS) TABS tablet Take 1 tablet by mouth 2 (two) times daily.     Multiple Vitamins-Minerals (ZINC PO) Take 1 tablet by mouth 2 (two)  times daily.     nystatin cream (MYCOSTATIN) Apply 1 Application topically as needed for dry skin (fever blisters).     olmesartan (BENICAR) 20 MG tablet Take 1/2 tablet  Daily  for BP (Patient taking differently: Take 10 mg by mouth in the morning and at bedtime.) 45 tablet 3   Polyethyl Glycol-Propyl Glycol (SYSTANE) 0.4-0.3 % GEL ophthalmic gel Place 1 Application into both eyes at bedtime.     Polyethyl Glycol-Propyl Glycol (SYSTANE) 0.4-0.3 % SOLN Place 1 drop into both eyes every morning.     TAURINE PO Take 1 capsule by mouth daily.     Emollient (CERAVE HEALING) OINT  Apply 1 Application topically 2 (two) times daily as needed (wound care/dry skin). (Patient not taking: Reported on 12/01/2021)     leucovorin (WELLCOVORIN) 5 MG tablet Take by mouth every 6 (six) hours. (Patient not taking: Reported on 12/01/2021)     Pramox-PE-Glycerin-Petrolatum (PREPARATION H) 1-0.25-14.4-15 % CREA Apply 1 Application topically 2 (two) times daily as needed (hemorrhoid pain). (Patient not taking: Reported on 12/01/2021)     traMADol (ULTRAM) 50 MG tablet Take 25 mg by mouth at bedtime. (Patient not taking: Reported on 12/01/2021)     Zinc Oxide 40 % PSTE Apply 1 Application topically 2 (two) times daily as needed (wound care/barrier cream). 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 Herbert Deaner   FRAMELESS  BIOPSY WITH BRAINLAB Right 09/02/2021   Procedure: FRONTAL STEREOTACTIC BRAIN BIOPSY;  Surgeon: Vallarie Mare, MD;  Location: [facility];  Service: Neurosurgery;   Laterality: Right;   IR IMAGING GUIDED PORT INSERTION  09/24/2021   RADIAL HEAD ARTHROPLASTY Right 09/16/2012   Procedure: RADIAL HEAD ARTHROPLASTY;  Surgeon: Johnny Bridge, MD;  Location: [facility];  Service: Orthopedics;  Laterality: Right;  RADIAL HEAD REPLACEMENT     Social History:  Social History   Socioeconomic History   Marital status: Married    Spouse name: Not on file   Number of children: 2   Years of education: Not on file   Highest education level: Not on file  Occupational History   Not on file  Tobacco Use   Smoking status: Former    Packs/day: 2.00    Years: 22.00    Total pack years: 44.00    Types: Cigarettes    Quit date: 09/17/1983  Years since quitting: 38.2   Smokeless tobacco: Never  Vaping Use   Vaping Use: Never used  Substance and Sexual Activity   Alcohol use: Not Currently    Alcohol/week: 0.0 - 1.0 standard drinks of alcohol    Comment: 2/month   Drug use: No   Sexual activity: Not Currently  Other Topics Concern   Not on file  Social History Narrative   Not on file   Social Determinants of Health   Financial Resource Strain: Not on file  Food Insecurity: Not on file  Transportation Needs: Not on file  Physical Activity: Sufficiently Active (09/14/2017)   Exercise Vital Sign    Days of Exercise per Week: 5 days    Minutes of Exercise per Session: 60 min  Stress: No Stress Concern Present (09/14/2017)   [place]    Feeling of Stress : Only a little  Social Connections: Not on file  Intimate Partner Violence: Not on file   Family History:  Family History  Problem Relation Age of Onset   Heart disease Mother    Brain cancer Father 24       Brain tumor, not cancerous   Hypertension Sister    Osteoporosis Sister    Arthritis Sister    Scoliosis Sister    Hypertension Sister    Osteoporosis Sister    Cancer Paternal Grandfather    Suicidality  Paternal Grandfather    Breast cancer Neg Hx     Review of Systems: Constitutional: Doesn't report fevers, chills or abnormal weight loss Eyes: Doesn't report blurriness of vision Ears, nose, mouth, throat, and face: Doesn't report sore throat Respiratory: Doesn't report cough, dyspnea or wheezes Cardiovascular: Doesn't report palpitation, chest discomfort  Gastrointestinal:  +vomiting GU: Doesn't report incontinence Skin: Doesn't report skin rashes Neurological: Per HPI Musculoskeletal: Doesn't report joint pain Behavioral/Psych: Doesn't report anxiety  Physical Exam: Vitals:   12/04/21 0516 12/04/21 1332  BP: (!) 124/51 (!) 125/45  Pulse: 69 60  Resp: 16 16  Temp: 98.2 F (36.8 C) 98.9 F (37.2 C)  SpO2: 90% 90%    KPS: 80. General: Alert, cooperative, pleasant, in no acute distress Head: Normal EENT: No conjunctival injection or scleral icterus.  Lungs: Resp effort normal Cardiac: Regular rate Abdomen: Non-distended abdomen Skin: No rashes cyanosis or petechiae. Extremities: No clubbing or edema  Neurologic Exam: Mental Status: Awake, alert, attentive to examiner. Oriented to self and environment. Language is fluent with intact comprehension.  Cranial Nerves: Visual acuity is grossly normal. Visual fields are full. Extra-ocular movements intact. No ptosis. Face is symmetric Motor: Tone and bulk are normal. Power is full in both arms and legs. Reflexes are symmetric, no pathologic reflexes present.  Sensory: Intact to light touch Gait: Orthopedic limitation, cane assisted   Labs: I have reviewed the data as listed    Component Value Date/Time   NA 142 12/04/2021 0456   K 2.9 (L) 12/04/2021 0456   CL 95 (L) 12/04/2021 0456   CO2 38 (H) 12/04/2021 0456   GLUCOSE 108 (H) 12/04/2021 0456   BUN 12 12/04/2021 0456   CREATININE 1.05 (H) 12/04/2021 0456   CREATININE 0.85 11/05/2021 1619   CALCIUM 8.8 (L) 12/04/2021 0456   PROT 5.8 (L) 12/04/2021 0456   ALBUMIN  3.1 (L) 12/04/2021 0456   AST 58 (H) 12/04/2021 0456   ALT 68 (H) 12/04/2021 0456   ALKPHOS 42 12/04/2021 0456   BILITOT 0.9 12/04/2021 0456  GFRNONAA 52 (L) 12/04/2021 0456   GFRNONAA 66 09/19/2020 0000   GFRAA 76 09/19/2020 0000   Lab Results  Component Value Date   WBC 7.8 12/04/2021   NEUTROABS 5.7 12/04/2021   HGB 11.2 (L) 12/04/2021   HCT 33.3 (L) 12/04/2021   MCV 98.8 12/04/2021   PLT 155 12/04/2021    Pathology: SURGICAL PATHOLOGY  CASE: [id number]  PATIENT: Daisy Oliver  Surgical Pathology Report   Clinical History: brain tumor (cm)   FINAL MICROSCOPIC DIAGNOSIS:   A. BRAIN TUMOR, RIGHT FRONTAL LOBE, BIOPSY:  -  Diffuse large B-cell lymphoma.  B. BRAIN TUMOR, RIGHT FRONTAL LOBE, BIOPSY:  -  Diffuse large B-cell lymphoma.   Note: The cells of interest consist of large cells with prominent small  nuclear membrane bound nucleoli and apparent scant cytoplasm.  Immunohistochemical stains with appropriate controls were performed on  both parts A and B given the morphologic differences (likely frozen  section artifact).  The cells of interest are positive for CD20 and PAX5  consistent with B-cell origin.  GFAP highlights background brain  parenchyma.  These same cells coexpress MUM1 (greater than 50%) BCL6 and  CD5.  CD10 is essentially negative.  Bcl-2 is equivocal given high  background but interpreted as likely positive.  CD3 highlights  background small T cells.  The proliferation rate by Ki-67 is  approximately 50%.  CD30 is negative.  EBER ISH is negative.  P53 shows  scattered positive cells of uncertain clinical significance.  Overall  the findings are consistent with a diffuse large B-cell lymphoma; if a  systemic work-up is negative for lymphoma, the findings would be  consistent with a primary CNS lymphoma.  The immunophenotype is most  consistent with a post germinal center/activated immunophenotype.  There  is sufficient material present for  further FISH/molecular work-up if  clinically indicated (please call office for add-on testing if  necessary).   Dr. Gari Crown has peer reviewed the case and agrees with the interpretation.   INTRAOPERATIVE DIAGNOSIS:   A. Brain Tumor, Right Frontal Lobe, Biopsy: "High grade astrocytoma."  Intraoperative diagnosis rendered by Dr. Thornell Sartorius at 14:55 on 02 Sep 2021.    Assessment/Plan Primary CNS Lymphoma  12/04/21: -Rituximab today -Compazine for nausea -NOW dose IV decadron 38m for nausea, lethargy -Con't Leucovorin -Con't IVF at 125/hr -Replete K+ 447m PO -MTX level tmrw AM  ------------------------------------------------------  -Once urine alkalinized and labs reviewed, appropriate to proceed with C3 of high dose MTX with leucovorin rescue inpatient -MTX-R therapy C3D1 hopefully tomorrow 12/02/21: Methotrexate 3.5g/m2 given over 4 hours once urine pH >= 7.0 -Leucovorin 168m6 hours 24 hours after MTX infusion   -Rituxan can be given 24-48 hours after MTX infusion -To start once electrolytes stabilized: IV fluids, dextrose with 150m43modium bicarbonate at 125mL32m  Need to adjust rate to urine pH >= 7 -daily labs with cbc, cmp -daily urine pH once it reaches target pH of >7 -daily MTX levels following MTX infusion until MTX levels <0.10 -continue same supportive medications with exception of protonix which interferes with clearance of MTX  -LFTs and renal function both normal at this time   May continue home meds as prior, we will hold vitamins and supplements during the admission.  The total time spent in the encounter was 30 minutes and more than 50% was on counseling and review of test results  Please call/text/page with any clinical concerns, updates.  ZachaVentura SellersMedical Director of Neuro-Oncology [facility]  at [place] 09/16/21 2:28 PM

## 2021-12-04 NOTE — Care Management Important Message (Signed)
Important Message  Patient Details IM Letter given to the Patient. Name: Daisy Oliver MRN: [id number] Date of Birth: 1936-01-11   Medicare Important Message Given:  Yes     Kerin Salen 12/04/2021, 9:02 AM

## 2021-12-04 NOTE — Progress Notes (Signed)
[facility] at [facility] [place], [place] [address] [phone]   Progress Note  Date of Service: 09/16/21 Patient Name: Daisy Oliver Patient MRN: [id number] Patient DOB: [birth year] Provider: [name], MD  Identifying Statement:  Daisy Oliver is a 86 y.o. female with right frontal  CNS lymphoma  who presents for first cycle of high dose methotrexate, rituximab.  Referring Provider: [name], MD [place],  [place] [address]  Oncologic History: Oncology History  Primary CNS lymphoma [facility])  09/02/2021 Surgery   Stereotactic biopsy by Dr. Marcello Moores; path is B-cell lymphoma CD20+   09/30/2021 -  Chemotherapy   Patient is on Treatment Plan : IP NON-HODGKINS LYMPHOMA High Dose Methotrexate + Leucovorin Rescue     10/02/2021 -  Chemotherapy   Patient is on Treatment Plan : NON-HODGKINS LYMPHOMA Rituximab q21d       Biomarkers:  CD20 positive .  Ki-67 50% .   Unknown   Unknown   Interval History: Daisy Oliver has no new complaints.  Requested some melatonin for sleep issues.  Patient presented to medical attention in May 2023 with several weeks of progressive gait instability.  She describes feeling unsteady on her feet, even falling "a couple of times".  After one of the falls, ED visit led to CNS imaging which demonstrated multifocal masses in the right hemisphere.  She underwent stereotactic biopsy with Dr. Marcello Moores on 09/02/21; path demonstrated diffuse large B-cell lymphoma, CD20+.  Since surgery, she has tapered off the steroids without issue.  She feels "fairly normal" aside from pain in her knee and lower back.  She uses a cane to walk, mainly because of the knee pain.  No issues with frank weakness, no seizures.  She lives at home with her husband who has dementia.  Daughter is nearby in [place].    Medications: No current facility-administered medications on file prior to encounter.    Current Outpatient Medications on File Prior to Encounter  Medication Sig Dispense Refill   acetaminophen (TYLENOL) 500 MG tablet Take 500 mg by mouth 2 (two) times daily as needed (pain).     Ascorbic Acid (VITAMIN C PO) Take 1 tablet by mouth daily.     BIOTIN PO Take 1 tablet by mouth 2 (two) times daily.     famotidine (PEPCID) 20 MG tablet Take 20 mg by mouth at bedtime as needed for indigestion.     liothyronine (CYTOMEL) 5 MCG tablet Take 2 tablets (10 mcg total) by mouth See admin instructions. Take 10 mcg before breakfast. (Patient taking differently: Take 5 mcg by mouth in the morning and at bedtime.) 180 tablet 3   MAGNESIUM PO Take 1 capsule by mouth 2 (two) times daily.     Melatonin 10 MG TABS Take 10 mg by mouth at bedtime.     Menthol, Topical Analgesic, (BIOFREEZE EX) Apply 1 Application topically at bedtime as needed (leg pain).     Menthol, Topical Analgesic, (BIOFREEZE) 5 % PTCH Apply 1 patch topically as needed (back pain).     Misc Natural Products (OSTEO BI-FLEX JOINT SHIELD PO) Take 1 tablet by mouth 2 (two) times daily.     Multiple Vitamin (MULTIVITAMIN WITH MINERALS) TABS tablet Take 1 tablet by mouth 2 (two) times daily.     Multiple Vitamins-Minerals (ZINC PO) Take 1 tablet by mouth 2 (two) times daily.     nystatin cream (MYCOSTATIN) Apply 1 Application topically as  needed for dry skin (fever blisters).     olmesartan (BENICAR) 20 MG tablet Take 1/2 tablet  Daily  for BP (Patient taking differently: Take 10 mg by mouth in the morning and at bedtime.) 45 tablet 3   Polyethyl Glycol-Propyl Glycol (SYSTANE) 0.4-0.3 % GEL ophthalmic gel Place 1 Application into both eyes at bedtime.     Polyethyl Glycol-Propyl Glycol (SYSTANE) 0.4-0.3 % SOLN Place 1 drop into both eyes every morning.     TAURINE PO Take 1 capsule by mouth daily.     Emollient (CERAVE HEALING) OINT Apply 1 Application topically 2 (two) times daily as needed (wound care/dry skin). (Patient not taking:  Reported on 12/01/2021)     leucovorin (WELLCOVORIN) 5 MG tablet Take by mouth every 6 (six) hours. (Patient not taking: Reported on 12/01/2021)     Pramox-PE-Glycerin-Petrolatum (PREPARATION H) 1-0.25-14.4-15 % CREA Apply 1 Application topically 2 (two) times daily as needed (hemorrhoid pain). (Patient not taking: Reported on 12/01/2021)     traMADol (ULTRAM) 50 MG tablet Take 25 mg by mouth at bedtime. (Patient not taking: Reported on 12/01/2021)     Zinc Oxide 40 % PSTE Apply 1 Application topically 2 (two) times daily as needed (wound care/barrier cream). 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 Herbert Deaner   FRAMELESS  BIOPSY WITH BRAINLAB Right 09/02/2021   Procedure: FRONTAL STEREOTACTIC BRAIN BIOPSY;  Surgeon: Vallarie Mare, MD;  Location: [facility];  Service: Neurosurgery;  Laterality: Right;   IR IMAGING GUIDED PORT INSERTION  09/24/2021   RADIAL HEAD ARTHROPLASTY Right 09/16/2012    Procedure: RADIAL HEAD ARTHROPLASTY;  Surgeon: Johnny Bridge, MD;  Location: [facility];  Service: Orthopedics;  Laterality: Right;  RADIAL HEAD REPLACEMENT     Social History:  Social History   Socioeconomic History   Marital status: Married    Spouse name: Not on file   Number of children: 2   Years of education: Not on file   Highest education level: Not on file  Occupational History   Not on file  Tobacco Use   Smoking status: Former    Packs/day: 2.00    Years: 22.00    Total pack years: 44.00    Types: Cigarettes    Quit date: 09/17/1983    Years since quitting: 38.2   Smokeless tobacco: Never  Vaping Use  Vaping Use: Never used  Substance and Sexual Activity   Alcohol use: Not Currently    Alcohol/week: 0.0 - 1.0 standard drinks of alcohol    Comment: 2/month   Drug use: No   Sexual activity: Not Currently  Other Topics Concern   Not on file  Social History Narrative   Not on file   Social Determinants of Health   Financial Resource Strain: Not on file  Food Insecurity: Not on file  Transportation Needs: Not on file  Physical Activity: Sufficiently Active (09/14/2017)   Exercise Vital Sign    Days of Exercise per Week: 5 days    Minutes of Exercise per Session: 60 min  Stress: No Stress Concern Present (09/14/2017)   [place]    Feeling of Stress : Only a little  Social Connections: Not on file  Intimate Partner Violence: Not on file   Family History:  Family History  Problem Relation Age of Onset   Heart disease Mother    Brain cancer Father 25       Brain tumor, not cancerous   Hypertension Sister    Osteoporosis Sister    Arthritis Sister    Scoliosis Sister    Hypertension Sister    Osteoporosis Sister    Cancer Paternal Grandfather    Suicidality Paternal Grandfather    Breast cancer Neg Hx     Review of Systems: Constitutional: Doesn't report fevers,  chills or abnormal weight loss Eyes: Doesn't report blurriness of vision Ears, nose, mouth, throat, and face: Doesn't report sore throat Respiratory: Doesn't report cough, dyspnea or wheezes Cardiovascular: Doesn't report palpitation, chest discomfort  Gastrointestinal:  Doesn't report nausea, constipation, diarrhea GU: Doesn't report incontinence Skin: Doesn't report skin rashes Neurological: Per HPI Musculoskeletal: Doesn't report joint pain Behavioral/Psych: Doesn't report anxiety  Physical Exam: Vitals:   12/03/21 1951 12/04/21 0516  BP: 112/63 (!) 124/51  Pulse: 70 69  Resp: 18 16  Temp: 98.2 F (36.8 C) 98.2 F (36.8 C)  SpO2: 90% 90%    KPS: 80. General: Alert, cooperative, pleasant, in no acute distress Head: Normal EENT: No conjunctival injection or scleral icterus.  Lungs: Resp effort normal Cardiac: Regular rate Abdomen: Non-distended abdomen Skin: No rashes cyanosis or petechiae. Extremities: No clubbing or edema  Neurologic Exam: Mental Status: Awake, alert, attentive to examiner. Oriented to self and environment. Language is fluent with intact comprehension.  Cranial Nerves: Visual acuity is grossly normal. Visual fields are full. Extra-ocular movements intact. No ptosis. Face is symmetric Motor: Tone and bulk are normal. Power is full in both arms and legs. Reflexes are symmetric, no pathologic reflexes present.  Sensory: Intact to light touch Gait: Orthopedic limitation, cane assisted   Labs: I have reviewed the data as listed    Component Value Date/Time   NA 142 12/04/2021 0456   K 2.9 (L) 12/04/2021 0456   CL 95 (L) 12/04/2021 0456   CO2 38 (H) 12/04/2021 0456   GLUCOSE 108 (H) 12/04/2021 0456   BUN 12 12/04/2021 0456   CREATININE 1.05 (H) 12/04/2021 0456   CREATININE 0.85 11/05/2021 1619   CALCIUM 8.8 (L) 12/04/2021 0456   PROT 5.8 (L) 12/04/2021 0456   ALBUMIN 3.1 (L) 12/04/2021 0456   AST 58 (H) 12/04/2021 0456   ALT 68 (H) 12/04/2021  0456   ALKPHOS 42 12/04/2021 0456   BILITOT 0.9 12/04/2021 0456   GFRNONAA 52 (L) 12/04/2021 0456   GFRNONAA 66 09/19/2020 0000  GFRAA 76 09/19/2020 0000   Lab Results  Component Value Date   WBC 7.8 12/04/2021   NEUTROABS 5.7 12/04/2021   HGB 11.2 (L) 12/04/2021   HCT 33.3 (L) 12/04/2021   MCV 98.8 12/04/2021   PLT 155 12/04/2021    Pathology: SURGICAL PATHOLOGY  CASE: [id number]  PATIENT: Mariapaula Nidiffer  Surgical Pathology Report   Clinical History: brain tumor (cm)   FINAL MICROSCOPIC DIAGNOSIS:   A. BRAIN TUMOR, RIGHT FRONTAL LOBE, BIOPSY:  -  Diffuse large B-cell lymphoma.  B. BRAIN TUMOR, RIGHT FRONTAL LOBE, BIOPSY:  -  Diffuse large B-cell lymphoma.   Note: The cells of interest consist of large cells with prominent small  nuclear membrane bound nucleoli and apparent scant cytoplasm.  Immunohistochemical stains with appropriate controls were performed on  both parts A and B given the morphologic differences (likely frozen  section artifact).  The cells of interest are positive for CD20 and PAX5  consistent with B-cell origin.  GFAP highlights background brain  parenchyma.  These same cells coexpress MUM1 (greater than 50%) BCL6 and  CD5.  CD10 is essentially negative.  Bcl-2 is equivocal given high  background but interpreted as likely positive.  CD3 highlights  background small T cells.  The proliferation rate by Ki-67 is  approximately 50%.  CD30 is negative.  EBER ISH is negative.  P53 shows  scattered positive cells of uncertain clinical significance.  Overall  the findings are consistent with a diffuse large B-cell lymphoma; if a  systemic work-up is negative for lymphoma, the findings would be  consistent with a primary CNS lymphoma.  The immunophenotype is most  consistent with a post germinal center/activated immunophenotype.  There  is sufficient material present for further FISH/molecular work-up if  clinically indicated (please call office for  add-on testing if  necessary).   Dr. Gari Crown has peer reviewed the case and agrees with the interpretation.   INTRAOPERATIVE DIAGNOSIS:   A. Brain Tumor, Right Frontal Lobe, Biopsy: "High grade astrocytoma."  Intraoperative diagnosis rendered by Dr. Thornell Sartorius at 14:55 on 02 Sep 2021.    Assessment/Plan Primary CNS Lymphoma  12/03/21: -Begin Leucovorin today -Con't IVF at 125/hr -Replete K+ 72mq PO x2 -MTX level repeat tmrw AM -Rituximab Thursday  -Once urine alkalinized and labs reviewed, appropriate to proceed with C3 of high dose MTX with leucovorin rescue inpatient -MTX-R therapy C3D1 hopefully tomorrow 12/02/21: Methotrexate 3.5g/m2 given over 4 hours once urine pH >= 7.0 -Leucovorin 172mq6 hours 24 hours after MTX infusion   -Rituxan can be given 24-48 hours after MTX infusion -To start once electrolytes stabilized: IV fluids, dextrose with 15047msodium bicarbonate at 125m51m.  Need to adjust rate to urine pH >= 7 -daily labs with cbc, cmp -daily urine pH once it reaches target pH of >7 -daily MTX levels following MTX infusion until MTX levels <0.10 -continue same supportive medications with exception of protonix which interferes with clearance of MTX  -LFTs and renal function both normal at this time   May continue home meds as prior, we will hold vitamins and supplements during the admission.  The total time spent in the encounter was 30 minutes and more than 50% was on counseling and review of test results  Please call/text/page with any clinical concerns, updates.  ZachVentura Sellers Medical Director of Neuro-Oncology [facility] LLCWeslShady Shores06/23 6:51 AM

## 2021-12-04 NOTE — Progress Notes (Addendum)
This RN called into room d/t nausea. Pt vomited twice and PRN IV zofran used with good results. Pt reports feeling "weak". Bed alarm remains on and pt resting with call bell within reach.

## 2021-12-05 ENCOUNTER — Encounter: Payer: Self-pay | Admitting: Internal Medicine

## 2021-12-05 DIAGNOSIS — E876 Hypokalemia: Secondary | ICD-10-CM

## 2021-12-05 DIAGNOSIS — C8589 Other specified types of non-Hodgkin lymphoma, extranodal and solid organ sites: Secondary | ICD-10-CM | POA: Diagnosis not present

## 2021-12-05 LAB — URINALYSIS, ROUTINE W REFLEX MICROSCOPIC
Bilirubin Urine: NEGATIVE
Glucose, UA: NEGATIVE mg/dL
Hgb urine dipstick: NEGATIVE
Ketones, ur: NEGATIVE mg/dL
Leukocytes,Ua: NEGATIVE
Nitrite: NEGATIVE
Protein, ur: NEGATIVE mg/dL
Specific Gravity, Urine: 1.006 (ref 1.005–1.030)
pH: 9 — ABNORMAL HIGH (ref 5.0–8.0)

## 2021-12-05 LAB — CBC WITH DIFFERENTIAL/PLATELET
Abs Immature Granulocytes: 0.03 10*3/uL (ref 0.00–0.07)
Basophils Absolute: 0 10*3/uL (ref 0.0–0.1)
Basophils Relative: 0 %
Eosinophils Absolute: 0 10*3/uL (ref 0.0–0.5)
Eosinophils Relative: 0 %
HCT: 32.7 % — ABNORMAL LOW (ref 36.0–46.0)
Hemoglobin: 11.1 g/dL — ABNORMAL LOW (ref 12.0–15.0)
Immature Granulocytes: 1 %
Lymphocytes Relative: 7 %
Lymphs Abs: 0.5 10*3/uL — ABNORMAL LOW (ref 0.7–4.0)
MCH: 33.5 pg (ref 26.0–34.0)
MCHC: 33.9 g/dL (ref 30.0–36.0)
MCV: 98.8 fL (ref 80.0–100.0)
Monocytes Absolute: 0.1 10*3/uL (ref 0.1–1.0)
Monocytes Relative: 2 %
Neutro Abs: 5.7 10*3/uL (ref 1.7–7.7)
Neutrophils Relative %: 90 %
Platelets: 162 10*3/uL (ref 150–400)
RBC: 3.31 MIL/uL — ABNORMAL LOW (ref 3.87–5.11)
RDW: 13.2 % (ref 11.5–15.5)
WBC: 6.3 10*3/uL (ref 4.0–10.5)
nRBC: 0 % (ref 0.0–0.2)

## 2021-12-05 LAB — COMPREHENSIVE METABOLIC PANEL
ALT: 78 U/L — ABNORMAL HIGH (ref 0–44)
AST: 82 U/L — ABNORMAL HIGH (ref 15–41)
Albumin: 2.8 g/dL — ABNORMAL LOW (ref 3.5–5.0)
Alkaline Phosphatase: 40 U/L (ref 38–126)
Anion gap: 8 (ref 5–15)
BUN: 12 mg/dL (ref 8–23)
CO2: 36 mmol/L — ABNORMAL HIGH (ref 22–32)
Calcium: 8.7 mg/dL — ABNORMAL LOW (ref 8.9–10.3)
Chloride: 97 mmol/L — ABNORMAL LOW (ref 98–111)
Creatinine, Ser: 1.08 mg/dL — ABNORMAL HIGH (ref 0.44–1.00)
GFR, Estimated: 51 mL/min — ABNORMAL LOW (ref >60)
Glucose, Bld: 150 mg/dL — ABNORMAL HIGH (ref 70–99)
Potassium: 3.2 mmol/L — ABNORMAL LOW (ref 3.5–5.1)
Sodium: 141 mmol/L (ref 135–145)
Total Bilirubin: 0.9 mg/dL (ref 0.3–1.2)
Total Protein: 5.6 g/dL — ABNORMAL LOW (ref 6.5–8.1)

## 2021-12-05 LAB — METHOTREXATE: Methotrexate: 0.25

## 2021-12-05 MED ORDER — POLYETHYLENE GLYCOL 3350 17 G PO PACK
17.0000 g | PACK | Freq: Every day | ORAL | Status: DC | PRN
  Administered 2021-12-05: 17 g via ORAL
  Filled 2021-12-05: qty 1

## 2021-12-05 MED ORDER — POLYETHYLENE GLYCOL 3350 17 G PO PACK
17.0000 g | PACK | Freq: Every day | ORAL | Status: DC
Start: 2021-12-05 — End: 2021-12-05

## 2021-12-05 MED ORDER — POTASSIUM CHLORIDE CRYS ER 20 MEQ PO TBCR
40.0000 meq | EXTENDED_RELEASE_TABLET | Freq: Once | ORAL | Status: AC
  Administered 2021-12-05: 40 meq via ORAL
  Filled 2021-12-05: qty 2

## 2021-12-05 MED ORDER — LEUCOVORIN CALCIUM INJECTION 100 MG
15.0000 mg | Freq: Four times a day (QID) | INTRAMUSCULAR | Status: AC
  Administered 2021-12-05 – 2021-12-06 (×4): 16 mg via INTRAVENOUS
  Filled 2021-12-05 (×7): qty 0.8

## 2021-12-05 NOTE — Progress Notes (Signed)
[facility] [place], [place] [address] [phone]   Progress Note  Date of Service: 09/16/21 Patient Name: Daisy Oliver Patient MRN: [id number] Patient DOB: [birth year] Provider: [name], MD  Identifying Statement:  Daisy Oliver is a 86 y.o. female with right frontal  CNS lymphoma  who presents for first cycle of high dose methotrexate, rituximab.  Referring Provider: [name], MD [place],  [place] [address]  Oncologic History: Oncology History  Primary CNS lymphoma [facility])  09/02/2021 Surgery   Stereotactic biopsy by Dr. Marcello Moores; path is B-cell lymphoma CD20+   09/30/2021 -  Chemotherapy   Patient is on Treatment Plan : IP NON-HODGKINS LYMPHOMA High Dose Methotrexate + Leucovorin Rescue     10/02/2021 -  Chemotherapy   Patient is on Treatment Plan : NON-HODGKINS LYMPHOMA Rituximab q21d       Biomarkers:  CD20 positive .  Ki-67 50% .   Unknown   Unknown   Interval History: Daisy Oliver had bowel movement after miralax last night, no recurrence of vomiting or other GI symptoms. No new complaints.  Patient presented to medical attention in May 2023 with several weeks of progressive gait instability.  She describes feeling unsteady on her feet, even falling "a couple of times".  After one of the falls, ED visit led to CNS imaging which demonstrated multifocal masses in the right hemisphere.  She underwent stereotactic biopsy with Dr. Marcello Moores on 09/02/21; path demonstrated diffuse large B-cell lymphoma, CD20+.  Since surgery, she has tapered off the steroids without issue.  She feels "fairly normal" aside from pain in her knee and lower back.  She uses a cane to walk, mainly because of the knee pain.  No issues with frank weakness, no seizures.  She lives at home with her husband who has dementia.  Daughter is nearby in [place].    Medications: No current  facility-administered medications on file prior to encounter.   Current Outpatient Medications on File Prior to Encounter  Medication Sig Dispense Refill   acetaminophen (TYLENOL) 500 MG tablet Take 500 mg by mouth 2 (two) times daily as needed (pain).     Ascorbic Acid (VITAMIN C PO) Take 1 tablet by mouth daily.     BIOTIN PO Take 1 tablet by mouth 2 (two) times daily.     famotidine (PEPCID) 20 MG tablet Take 20 mg by mouth at bedtime as needed for indigestion.     liothyronine (CYTOMEL) 5 MCG tablet Take 2 tablets (10 mcg total) by mouth See admin instructions. Take 10 mcg before breakfast. (Patient taking differently: Take 5 mcg by mouth in the morning and at bedtime.) 180 tablet 3   MAGNESIUM PO Take 1 capsule by mouth 2 (two) times daily.     Melatonin 10 MG TABS Take 10 mg by mouth at bedtime.     Menthol, Topical Analgesic, (BIOFREEZE EX) Apply 1 Application topically at bedtime as needed (leg pain).     Menthol, Topical Analgesic, (BIOFREEZE) 5 % PTCH Apply 1 patch topically as needed (back pain).     Misc Natural Products (OSTEO BI-FLEX JOINT SHIELD PO) Take 1 tablet by mouth 2 (two) times daily.     Multiple Vitamin (MULTIVITAMIN WITH MINERALS) TABS tablet Take 1 tablet by mouth 2 (two) times daily.     Multiple Vitamins-Minerals (ZINC PO) Take 1 tablet by mouth 2 (two) times daily.     nystatin  cream (MYCOSTATIN) Apply 1 Application topically as needed for dry skin (fever blisters).     olmesartan (BENICAR) 20 MG tablet Take 1/2 tablet  Daily  for BP (Patient taking differently: Take 10 mg by mouth in the morning and at bedtime.) 45 tablet 3   Polyethyl Glycol-Propyl Glycol (SYSTANE) 0.4-0.3 % GEL ophthalmic gel Place 1 Application into both eyes at bedtime.     Polyethyl Glycol-Propyl Glycol (SYSTANE) 0.4-0.3 % SOLN Place 1 drop into both eyes every morning.     TAURINE PO Take 1 capsule by mouth daily.     Emollient (CERAVE HEALING) OINT Apply 1 Application topically 2 (two) times  daily as needed (wound care/dry skin). (Patient not taking: Reported on 12/01/2021)     leucovorin (WELLCOVORIN) 5 MG tablet Take by mouth every 6 (six) hours. (Patient not taking: Reported on 12/01/2021)     Pramox-PE-Glycerin-Petrolatum (PREPARATION H) 1-0.25-14.4-15 % CREA Apply 1 Application topically 2 (two) times daily as needed (hemorrhoid pain). (Patient not taking: Reported on 12/01/2021)     traMADol (ULTRAM) 50 MG tablet Take 25 mg by mouth at bedtime. (Patient not taking: Reported on 12/01/2021)     Zinc Oxide 40 % PSTE Apply 1 Application topically 2 (two) times daily as needed (wound care/barrier cream). 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 Herbert Deaner   FRAMELESS  BIOPSY WITH BRAINLAB Right 09/02/2021   Procedure: FRONTAL STEREOTACTIC BRAIN BIOPSY;  Surgeon: Vallarie Mare, MD;  Location: [facility];  Service: Neurosurgery;  Laterality: Right;   IR IMAGING GUIDED PORT INSERTION  09/24/2021   RADIAL HEAD ARTHROPLASTY  Right 09/16/2012   Procedure: RADIAL HEAD ARTHROPLASTY;  Surgeon: Johnny Bridge, MD;  Location: [facility];  Service: Orthopedics;  Laterality: Right;  RADIAL HEAD REPLACEMENT     Social History:  Social History   Socioeconomic History   Marital status: Married    Spouse name: Not on file   Number of children: 2   Years of education: Not on file   Highest education level: Not on file  Occupational History   Not on file  Tobacco Use   Smoking status: Former    Packs/day: 2.00    Years: 22.00    Total pack years: 44.00    Types: Cigarettes    Quit date: 09/17/1983    Years since quitting: 38.2   Smokeless tobacco: Never  Vaping Use   Vaping Use:  Never used  Substance and Sexual Activity   Alcohol use: Not Currently    Alcohol/week: 0.0 - 1.0 standard drinks of alcohol    Comment: 2/month   Drug use: No   Sexual activity: Not Currently  Other Topics Concern   Not on file  Social History Narrative   Not on file   Social Determinants of Health   Financial Resource Strain: Not on file  Food Insecurity: Not on file  Transportation Needs: Not on file  Physical Activity: Sufficiently Active (09/14/2017)   Exercise Vital Sign    Days of Exercise per Week: 5 days    Minutes of Exercise per Session: 60 min  Stress: No Stress Concern Present (09/14/2017)   [place]    Feeling of Stress : Only a little  Social Connections: Not on file  Intimate Partner Violence: Not on file   Family History:  Family History  Problem Relation Age of Onset   Heart disease Mother    Brain cancer Father 39       Brain tumor, not cancerous   Hypertension Sister    Osteoporosis Sister    Arthritis Sister    Scoliosis Sister    Hypertension Sister    Osteoporosis Sister    Cancer Paternal Grandfather    Suicidality Paternal Grandfather    Breast cancer Neg Hx     Review of Systems: Constitutional: Doesn't  report fevers, chills or abnormal weight loss Eyes: Doesn't report blurriness of vision Ears, nose, mouth, throat, and face: Doesn't report sore throat Respiratory: Doesn't report cough, dyspnea or wheezes Cardiovascular: Doesn't report palpitation, chest discomfort  Gastrointestinal:  +vomiting GU: Doesn't report incontinence Skin: Doesn't report skin rashes Neurological: Per HPI Musculoskeletal: Doesn't report joint pain Behavioral/Psych: Doesn't report anxiety  Physical Exam: Vitals:   12/05/21 0533 12/05/21 1359  BP: (!) 102/47 (!) 112/44  Pulse: (!) 50 (!) 51  Resp: 18 16  Temp: 98.1 F (36.7 C)   SpO2: 91% 94%    KPS: 80. General: Alert, cooperative, pleasant, in no acute distress Head: Normal EENT: No conjunctival injection or scleral icterus.  Lungs: Resp effort normal Cardiac: Regular rate Abdomen: Non-distended abdomen Skin: No rashes cyanosis or petechiae. Extremities: No clubbing or edema  Neurologic Exam: Mental Status: Awake, alert, attentive to examiner. Oriented to self and environment. Language is fluent with intact comprehension.  Cranial Nerves: Visual acuity is grossly normal. Visual fields are full. Extra-ocular movements intact. No ptosis. Face is symmetric Motor: Tone and bulk are normal. Power is full in both arms and legs. Reflexes are symmetric, no pathologic reflexes present.  Sensory: Intact to light touch Gait: Orthopedic limitation, cane assisted   Labs: I have reviewed the data as listed    Component Value Date/Time   NA 141 12/05/2021 0500   K 3.2 (L) 12/05/2021 0500   CL 97 (L) 12/05/2021 0500   CO2 36 (H) 12/05/2021 0500   GLUCOSE 150 (H) 12/05/2021 0500   BUN 12 12/05/2021 0500   CREATININE 1.08 (H) 12/05/2021 0500   CREATININE 0.85 11/05/2021 1619   CALCIUM 8.7 (L) 12/05/2021 0500   PROT 5.6 (L) 12/05/2021 0500   ALBUMIN 2.8 (L) 12/05/2021 0500   AST 82 (H) 12/05/2021 0500   ALT 78 (H) 12/05/2021 0500   ALKPHOS 40  12/05/2021 0500   BILITOT 0.9 12/05/2021 0500   GFRNONAA 51 (L) 12/05/2021 0500   GFRNONAA 66 09/19/2020 0000   GFRAA 76 09/19/2020 0000  Lab Results  Component Value Date   WBC 6.3 12/05/2021   NEUTROABS 5.7 12/05/2021   HGB 11.1 (L) 12/05/2021   HCT 32.7 (L) 12/05/2021   MCV 98.8 12/05/2021   PLT 162 12/05/2021    Pathology: SURGICAL PATHOLOGY  CASE: [id number]  PATIENT: Daisy Oliver  Surgical Pathology Report   Clinical History: brain tumor (cm)   FINAL MICROSCOPIC DIAGNOSIS:   A. BRAIN TUMOR, RIGHT FRONTAL LOBE, BIOPSY:  -  Diffuse large B-cell lymphoma.  B. BRAIN TUMOR, RIGHT FRONTAL LOBE, BIOPSY:  -  Diffuse large B-cell lymphoma.   Note: The cells of interest consist of large cells with prominent small  nuclear membrane bound nucleoli and apparent scant cytoplasm.  Immunohistochemical stains with appropriate controls were performed on  both parts A and B given the morphologic differences (likely frozen  section artifact).  The cells of interest are positive for CD20 and PAX5  consistent with B-cell origin.  GFAP highlights background brain  parenchyma.  These same cells coexpress MUM1 (greater than 50%) BCL6 and  CD5.  CD10 is essentially negative.  Bcl-2 is equivocal given high  background but interpreted as likely positive.  CD3 highlights  background small T cells.  The proliferation rate by Ki-67 is  approximately 50%.  CD30 is negative.  EBER ISH is negative.  P53 shows  scattered positive cells of uncertain clinical significance.  Overall  the findings are consistent with a diffuse large B-cell lymphoma; if a  systemic work-up is negative for lymphoma, the findings would be  consistent with a primary CNS lymphoma.  The immunophenotype is most  consistent with a post germinal center/activated immunophenotype.  There  is sufficient material present for further FISH/molecular work-up if  clinically indicated (please call office for add-on testing if   necessary).   Dr. Gari Crown has peer reviewed the case and agrees with the interpretation.   INTRAOPERATIVE DIAGNOSIS:   A. Brain Tumor, Right Frontal Lobe, Biopsy: "High grade astrocytoma."  Intraoperative diagnosis rendered by Dr. Thornell Sartorius at 14:55 on 02 Sep 2021.    Assessment/Plan Primary CNS Lymphoma  12/05/21: -Con't Leucovorin -Con't IVF at 125/hr -Replete K+ 5mq PO -MTX level tmrw AM -Discharge tomorrow if MTX <= 0.10  ------------------------------------------------------  -Once urine alkalinized and labs reviewed, appropriate to proceed with C3 of high dose MTX with leucovorin rescue inpatient -MTX-R therapy C3D1 hopefully tomorrow 12/02/21: Methotrexate 3.5g/m2 given over 4 hours once urine pH >= 7.0 -Leucovorin 116mq6 hours 24 hours after MTX infusion   -Rituxan can be given 24-48 hours after MTX infusion -To start once electrolytes stabilized: IV fluids, dextrose with 15044msodium bicarbonate at 125m46m.  Need to adjust rate to urine pH >= 7 -daily labs with cbc, cmp -daily urine pH once it reaches target pH of >7 -daily MTX levels following MTX infusion until MTX levels <0.10 -continue same supportive medications with exception of protonix which interferes with clearance of MTX  -LFTs and renal function both normal at this time   May continue home meds as prior, we will hold vitamins and supplements during the admission.  The total time spent in the encounter was 30 minutes and more than 50% was on counseling and review of test results  Please call/text/page with any clinical concerns, updates.  ZachVentura Sellers Medical Director of Neuro-Oncology [facility] CenterWeslPittsburg06/23 6:54 PM

## 2021-12-05 NOTE — Progress Notes (Signed)
Mobility Specialist - Progress Note   12/05/21 1231  Mobility  Activity Ambulated with assistance in hallway  Level of Assistance Standby assist, set-up cues, supervision of patient - no hands on  Assistive Device Front wheel walker  Distance Ambulated (ft) 1000 ft  Activity Response Tolerated well  $Mobility charge 1 Mobility   Pt received in bed and agreed to mobility. No c/o pain or nausea during ambulation. Pt left in bed with all needs met and call bell within reach.   Roderick Pee Mobility Specialist

## 2021-12-05 NOTE — Progress Notes (Signed)
Patient asked RN to remove gluten as an allergy in her chart. Patient stated gluten is not an allergy, it is an intolerance. Patient would like to order oatmeal - which is considered not gluten free.

## 2021-12-06 ENCOUNTER — Encounter: Payer: Self-pay | Admitting: Internal Medicine

## 2021-12-06 DIAGNOSIS — C8589 Other specified types of non-Hodgkin lymphoma, extranodal and solid organ sites: Secondary | ICD-10-CM | POA: Diagnosis not present

## 2021-12-06 LAB — CBC WITH DIFFERENTIAL/PLATELET
Abs Immature Granulocytes: 0.02 10*3/uL (ref 0.00–0.07)
Basophils Absolute: 0.1 10*3/uL (ref 0.0–0.1)
Basophils Relative: 1 %
Eosinophils Absolute: 0.4 10*3/uL (ref 0.0–0.5)
Eosinophils Relative: 5 %
HCT: 31.8 % — ABNORMAL LOW (ref 36.0–46.0)
Hemoglobin: 10.7 g/dL — ABNORMAL LOW (ref 12.0–15.0)
Immature Granulocytes: 0 %
Lymphocytes Relative: 19 %
Lymphs Abs: 1.3 10*3/uL (ref 0.7–4.0)
MCH: 33.4 pg (ref 26.0–34.0)
MCHC: 33.6 g/dL (ref 30.0–36.0)
MCV: 99.4 fL (ref 80.0–100.0)
Monocytes Absolute: 0.1 10*3/uL (ref 0.1–1.0)
Monocytes Relative: 2 %
Neutro Abs: 4.6 10*3/uL (ref 1.7–7.7)
Neutrophils Relative %: 73 %
Platelet Morphology: NORMAL
Platelets: 164 10*3/uL (ref 150–400)
RBC: 3.2 MIL/uL — ABNORMAL LOW (ref 3.87–5.11)
RDW: 13.1 % (ref 11.5–15.5)
WBC: 6.4 10*3/uL (ref 4.0–10.5)
nRBC: 0 % (ref 0.0–0.2)

## 2021-12-06 LAB — COMPREHENSIVE METABOLIC PANEL
ALT: 162 U/L — ABNORMAL HIGH (ref 0–44)
AST: 171 U/L — ABNORMAL HIGH (ref 15–41)
Albumin: 2.7 g/dL — ABNORMAL LOW (ref 3.5–5.0)
Alkaline Phosphatase: 39 U/L (ref 38–126)
Anion gap: 7 (ref 5–15)
BUN: 9 mg/dL (ref 8–23)
CO2: 40 mmol/L — ABNORMAL HIGH (ref 22–32)
Calcium: 8.6 mg/dL — ABNORMAL LOW (ref 8.9–10.3)
Chloride: 95 mmol/L — ABNORMAL LOW (ref 98–111)
Creatinine, Ser: 1 mg/dL (ref 0.44–1.00)
GFR, Estimated: 56 mL/min — ABNORMAL LOW (ref >60)
Glucose, Bld: 98 mg/dL (ref 70–99)
Potassium: 2.6 mmol/L — CL (ref 3.5–5.1)
Sodium: 142 mmol/L (ref 135–145)
Total Bilirubin: 1.1 mg/dL (ref 0.3–1.2)
Total Protein: 5.6 g/dL — ABNORMAL LOW (ref 6.5–8.1)

## 2021-12-06 LAB — URINALYSIS, ROUTINE W REFLEX MICROSCOPIC
Bacteria, UA: NONE SEEN
Bilirubin Urine: NEGATIVE
Glucose, UA: NEGATIVE mg/dL
Hgb urine dipstick: NEGATIVE
Ketones, ur: NEGATIVE mg/dL
Leukocytes,Ua: NEGATIVE
Nitrite: NEGATIVE
Protein, ur: NEGATIVE mg/dL
Specific Gravity, Urine: 1.004 — ABNORMAL LOW (ref 1.005–1.030)
pH: 9 — ABNORMAL HIGH (ref 5.0–8.0)

## 2021-12-06 MED ORDER — POTASSIUM CHLORIDE 10 MEQ/100ML IV SOLN
10.0000 meq | INTRAVENOUS | Status: AC
  Administered 2021-12-06 (×2): 10 meq via INTRAVENOUS
  Filled 2021-12-06 (×2): qty 100

## 2021-12-06 MED ORDER — POTASSIUM CHLORIDE CRYS ER 20 MEQ PO TBCR
40.0000 meq | EXTENDED_RELEASE_TABLET | Freq: Once | ORAL | Status: AC
  Administered 2021-12-06: 40 meq via ORAL
  Filled 2021-12-06: qty 2

## 2021-12-06 MED ORDER — LEUCOVORIN CALCIUM INJECTION 100 MG
15.0000 mg | Freq: Four times a day (QID) | INTRAMUSCULAR | Status: AC
  Administered 2021-12-06 – 2021-12-07 (×4): 16 mg via INTRAVENOUS
  Filled 2021-12-06 (×7): qty 0.8

## 2021-12-06 NOTE — Progress Notes (Signed)
[facility] [place], [place] [address] [phone]   Progress Note  Date of Service: 09/16/21 Patient Name: Daisy Oliver Patient MRN: [id number] Patient DOB: 1935/04/16 Provider: [name], MD  Identifying Statement:  Daisy Oliver is a 86 y.o. female with right frontal  CNS lymphoma  who presents for first cycle of high dose methotrexate, rituximab.  Referring Provider: [name], MD [place],  [place] [id number]  Oncologic History: Oncology History  Primary CNS lymphoma [facility])  09/02/2021 Surgery   Stereotactic biopsy by Dr. Marcello Moores; path is B-cell lymphoma CD20+   09/30/2021 -  Chemotherapy   Patient is on Treatment Plan : IP NON-HODGKINS LYMPHOMA High Dose Methotrexate + Leucovorin Rescue     10/02/2021 -  Chemotherapy   Patient is on Treatment Plan : NON-HODGKINS LYMPHOMA Rituximab q21d       Biomarkers:  CD20 positive .  Ki-67 50% .   Unknown   Unknown   Interval History: Daisy Oliver is feeling well today.  She did have some issues with her breakfast order this morning.  Feels ready to go home.  Patient presented to medical attention in May 2023 with several weeks of progressive gait instability.  She describes feeling unsteady on her feet, even falling "a couple of times".  After one of the falls, ED visit led to CNS imaging which demonstrated multifocal masses in the right hemisphere.  She underwent stereotactic biopsy with Dr. Marcello Moores on 09/02/21; path demonstrated diffuse large B-cell lymphoma, CD20+.  Since surgery, she has tapered off the steroids without issue.  She feels "fairly normal" aside from pain in her knee and lower back.  She uses a cane to walk, mainly because of the knee pain.  No issues with frank weakness, no seizures.  She lives at home with her husband who has dementia.  Daughter is nearby in [place].    Medications: No current  facility-administered medications on file prior to encounter.   Current Outpatient Medications on File Prior to Encounter  Medication Sig Dispense Refill   acetaminophen (TYLENOL) 500 MG tablet Take 500 mg by mouth 2 (two) times daily as needed (pain).     Ascorbic Acid (VITAMIN C PO) Take 1 tablet by mouth daily.     BIOTIN PO Take 1 tablet by mouth 2 (two) times daily.     famotidine (PEPCID) 20 MG tablet Take 20 mg by mouth at bedtime as needed for indigestion.     liothyronine (CYTOMEL) 5 MCG tablet Take 2 tablets (10 mcg total) by mouth See admin instructions. Take 10 mcg before breakfast. (Patient taking differently: Take 5 mcg by mouth in the morning and at bedtime.) 180 tablet 3   MAGNESIUM PO Take 1 capsule by mouth 2 (two) times daily.     Melatonin 10 MG TABS Take 10 mg by mouth at bedtime.     Menthol, Topical Analgesic, (BIOFREEZE EX) Apply 1 Application topically at bedtime as needed (leg pain).     Menthol, Topical Analgesic, (BIOFREEZE) 5 % PTCH Apply 1 patch topically as needed (back pain).     Misc Natural Products (OSTEO BI-FLEX JOINT SHIELD PO) Take 1 tablet by mouth 2 (two) times daily.     Multiple Vitamin (MULTIVITAMIN WITH MINERALS) TABS tablet Take 1 tablet by mouth 2 (two) times daily.     Multiple Vitamins-Minerals (ZINC PO) Take 1 tablet by mouth 2 (two) times daily.  nystatin cream (MYCOSTATIN) Apply 1 Application topically as needed for dry skin (fever blisters).     olmesartan (BENICAR) 20 MG tablet Take 1/2 tablet  Daily  for BP (Patient taking differently: Take 10 mg by mouth in the morning and at bedtime.) 45 tablet 3   Polyethyl Glycol-Propyl Glycol (SYSTANE) 0.4-0.3 % GEL ophthalmic gel Place 1 Application into both eyes at bedtime.     Polyethyl Glycol-Propyl Glycol (SYSTANE) 0.4-0.3 % SOLN Place 1 drop into both eyes every morning.     TAURINE PO Take 1 capsule by mouth daily.     Emollient (CERAVE HEALING) OINT Apply 1 Application topically 2 (two) times  daily as needed (wound care/dry skin). (Patient not taking: Reported on 12/01/2021)     leucovorin (WELLCOVORIN) 5 MG tablet Take by mouth every 6 (six) hours. (Patient not taking: Reported on 12/01/2021)     Pramox-PE-Glycerin-Petrolatum (PREPARATION H) 1-0.25-14.4-15 % CREA Apply 1 Application topically 2 (two) times daily as needed (hemorrhoid pain). (Patient not taking: Reported on 12/01/2021)     traMADol (ULTRAM) 50 MG tablet Take 25 mg by mouth at bedtime. (Patient not taking: Reported on 12/01/2021)     Zinc Oxide 40 % PSTE Apply 1 Application topically 2 (two) times daily as needed (wound care/barrier cream). 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 Herbert Deaner   FRAMELESS  BIOPSY WITH BRAINLAB Right 09/02/2021   Procedure: FRONTAL STEREOTACTIC BRAIN BIOPSY;  Surgeon: Vallarie Mare, MD;  Location: [facility];  Service: Neurosurgery;  Laterality: Right;   IR IMAGING GUIDED PORT INSERTION  09/24/2021   RADIAL HEAD ARTHROPLASTY  Right 09/16/2012   Procedure: RADIAL HEAD ARTHROPLASTY;  Surgeon: Johnny Bridge, MD;  Location: [facility];  Service: Orthopedics;  Laterality: Right;  RADIAL HEAD REPLACEMENT     Social History:  Social History   Socioeconomic History   Marital status: Married    Spouse name: Not on file   Number of children: 2   Years of education: Not on file   Highest education level: Not on file  Occupational History   Not on file  Tobacco Use   Smoking status: Former    Packs/day: 2.00    Years: 22.00    Total pack years: 44.00    Types: Cigarettes    Quit date: 09/17/1983    Years since quitting: 38.2   Smokeless tobacco: Never  Vaping Use   Vaping  Use: Never used  Substance and Sexual Activity   Alcohol use: Not Currently    Alcohol/week: 0.0 - 1.0 standard drinks of alcohol    Comment: 2/month   Drug use: No   Sexual activity: Not Currently  Other Topics Concern   Not on file  Social History Narrative   Not on file   Social Determinants of Health   Financial Resource Strain: Not on file  Food Insecurity: Not on file  Transportation Needs: Not on file  Physical Activity: Sufficiently Active (09/14/2017)   Exercise Vital Sign    Days of Exercise per Week: 5 days    Minutes of Exercise per Session: 60 min  Stress: No Stress Concern Present (09/14/2017)   [place]    Feeling of Stress : Only a little  Social Connections: Not on file  Intimate Partner Violence: Not on file   Family History:  Family History  Problem Relation Age of Onset   Heart disease Mother    Brain cancer Father 77       Brain tumor, not cancerous   Hypertension Sister    Osteoporosis Sister    Arthritis Sister    Scoliosis Sister    Hypertension Sister    Osteoporosis Sister    Cancer Paternal Grandfather    Suicidality Paternal Grandfather    Breast cancer Neg Hx     Review of Systems: Constitutional: Doesn't  report fevers, chills or abnormal weight loss Eyes: Doesn't report blurriness of vision Ears, nose, mouth, throat, and face: Doesn't report sore throat Respiratory: Doesn't report cough, dyspnea or wheezes Cardiovascular: Doesn't report palpitation, chest discomfort  Gastrointestinal:  +vomiting GU: Doesn't report incontinence Skin: Doesn't report skin rashes Neurological: Per HPI Musculoskeletal: Doesn't report joint pain Behavioral/Psych: Doesn't report anxiety  Physical Exam: Vitals:   12/05/21 2111 12/06/21 0423  BP: (!) 148/58 (!) 131/49  Pulse: (!) 56 (!) 50  Resp: 14 14  Temp: 98.3 F (36.8 C) 98.1 F (36.7 C)  SpO2: 95% 94%    KPS: 80. General: Alert, cooperative, pleasant, in no acute distress Head: Normal EENT: No conjunctival injection or scleral icterus.  Lungs: Resp effort normal Cardiac: Regular rate Abdomen: Non-distended abdomen Skin: No rashes cyanosis or petechiae. Extremities: LLE pitting edema  Neurologic Exam: Mental Status: Awake, alert, attentive to examiner. Oriented to self and environment. Language is fluent with intact comprehension.  Cranial Nerves: Visual acuity is grossly normal. Visual fields are full. Extra-ocular movements intact. No ptosis. Face is symmetric Motor: Tone and bulk are normal. Power is full in both arms and legs. Reflexes are symmetric, no pathologic reflexes present.  Sensory: Intact to light touch Gait: Orthopedic limitation, cane assisted   Labs: I have reviewed the data as listed    Component Value Date/Time   NA 142 12/06/2021 0500   K 2.6 (LL) 12/06/2021 0500   CL 95 (L) 12/06/2021 0500   CO2 40 (H) 12/06/2021 0500   GLUCOSE 98 12/06/2021 0500   BUN 9 12/06/2021 0500   CREATININE 1.00 12/06/2021 0500   CREATININE 0.85 11/05/2021 1619   CALCIUM 8.6 (L) 12/06/2021 0500   PROT 5.6 (L) 12/06/2021 0500   ALBUMIN 2.7 (L) 12/06/2021 0500   AST 171 (H) 12/06/2021 0500   ALT 162 (H) 12/06/2021 0500   ALKPHOS 39  12/06/2021 0500   BILITOT 1.1 12/06/2021 0500   GFRNONAA 56 (L) 12/06/2021 0500   GFRNONAA 66 09/19/2020 0000   GFRAA 76 09/19/2020  0000   Lab Results  Component Value Date   WBC 6.4 12/06/2021   NEUTROABS 4.6 12/06/2021   HGB 10.7 (L) 12/06/2021   HCT 31.8 (L) 12/06/2021   MCV 99.4 12/06/2021   PLT 164 12/06/2021    Pathology: SURGICAL PATHOLOGY  CASE: [id number]  PATIENT: Britain Dress  Surgical Pathology Report   Clinical History: brain tumor (cm)   FINAL MICROSCOPIC DIAGNOSIS:   A. BRAIN TUMOR, RIGHT FRONTAL LOBE, BIOPSY:  -  Diffuse large B-cell lymphoma.  B. BRAIN TUMOR, RIGHT FRONTAL LOBE, BIOPSY:  -  Diffuse large B-cell lymphoma.   Note: The cells of interest consist of large cells with prominent small  nuclear membrane bound nucleoli and apparent scant cytoplasm.  Immunohistochemical stains with appropriate controls were performed on  both parts A and B given the morphologic differences (likely frozen  section artifact).  The cells of interest are positive for CD20 and PAX5  consistent with B-cell origin.  GFAP highlights background brain  parenchyma.  These same cells coexpress MUM1 (greater than 50%) BCL6 and  CD5.  CD10 is essentially negative.  Bcl-2 is equivocal given high  background but interpreted as likely positive.  CD3 highlights  background small T cells.  The proliferation rate by Ki-67 is  approximately 50%.  CD30 is negative.  EBER ISH is negative.  P53 shows  scattered positive cells of uncertain clinical significance.  Overall  the findings are consistent with a diffuse large B-cell lymphoma; if a  systemic work-up is negative for lymphoma, the findings would be  consistent with a primary CNS lymphoma.  The immunophenotype is most  consistent with a post germinal center/activated immunophenotype.  There  is sufficient material present for further FISH/molecular work-up if  clinically indicated (please call office for add-on testing if   necessary).   Dr. Gari Crown has peer reviewed the case and agrees with the interpretation.   INTRAOPERATIVE DIAGNOSIS:   A. Brain Tumor, Right Frontal Lobe, Biopsy: "High grade astrocytoma."  Intraoperative diagnosis rendered by Dr. Thornell Sartorius at 14:55 on 02 Sep 2021.    Assessment/Plan Primary CNS Lymphoma  12/06/21: -Con't Leucovorin -Con't IVF at 125/hr -Replete K+ 60mq PO x2, 161m IV -MTX level tmrw AM -LLE edema should improve with cessation of IV hydration at d/c -Discharge tomorrow if MTX <= 0.10  ------------------------------------------------------  -Once urine alkalinized and labs reviewed, appropriate to proceed with C3 of high dose MTX with leucovorin rescue inpatient -MTX-R therapy C3D1 hopefully tomorrow 12/02/21: Methotrexate 3.5g/m2 given over 4 hours once urine pH >= 7.0 -Leucovorin 1665m6 hours 24 hours after MTX infusion   -Rituxan can be given 24-48 hours after MTX infusion -To start once electrolytes stabilized: IV fluids, dextrose with 150m53modium bicarbonate at 125mL48m  Need to adjust rate to urine pH >= 7 -daily labs with cbc, cmp -daily urine pH once it reaches target pH of >7 -daily MTX levels following MTX infusion until MTX levels <0.10 -continue same supportive medications with exception of protonix which interferes with clearance of MTX  -LFTs and renal function both normal at this time   May continue home meds as prior, we will hold vitamins and supplements during the admission.  The total time spent in the encounter was 30 minutes and more than 50% was on counseling and review of test results  Please call/text/page with any clinical concerns, updates.  ZachaVentura SellersMedical Director of Neuro-Oncology [facility]6/23 1:37 PM

## 2021-12-07 ENCOUNTER — Other Ambulatory Visit: Payer: Self-pay | Admitting: Internal Medicine

## 2021-12-07 DIAGNOSIS — C8589 Other specified types of non-Hodgkin lymphoma, extranodal and solid organ sites: Secondary | ICD-10-CM | POA: Diagnosis not present

## 2021-12-07 LAB — COMPREHENSIVE METABOLIC PANEL
ALT: 113 U/L — ABNORMAL HIGH (ref 0–44)
AST: 81 U/L — ABNORMAL HIGH (ref 15–41)
Albumin: 2.6 g/dL — ABNORMAL LOW (ref 3.5–5.0)
Alkaline Phosphatase: 39 U/L (ref 38–126)
Anion gap: 7 (ref 5–15)
BUN: 7 mg/dL — ABNORMAL LOW (ref 8–23)
CO2: 37 mmol/L — ABNORMAL HIGH (ref 22–32)
Calcium: 8.7 mg/dL — ABNORMAL LOW (ref 8.9–10.3)
Chloride: 97 mmol/L — ABNORMAL LOW (ref 98–111)
Creatinine, Ser: 0.88 mg/dL (ref 0.44–1.00)
GFR, Estimated: >60 mL/min (ref >60)
Glucose, Bld: 96 mg/dL (ref 70–99)
Potassium: 2.9 mmol/L — ABNORMAL LOW (ref 3.5–5.1)
Sodium: 141 mmol/L (ref 135–145)
Total Bilirubin: 1.1 mg/dL (ref 0.3–1.2)
Total Protein: 5.3 g/dL — ABNORMAL LOW (ref 6.5–8.1)

## 2021-12-07 LAB — CBC WITH DIFFERENTIAL/PLATELET
Abs Immature Granulocytes: 0.02 10*3/uL (ref 0.00–0.07)
Basophils Absolute: 0 10*3/uL (ref 0.0–0.1)
Basophils Relative: 1 %
Eosinophils Absolute: 0.3 10*3/uL (ref 0.0–0.5)
Eosinophils Relative: 7 %
HCT: 30.9 % — ABNORMAL LOW (ref 36.0–46.0)
Hemoglobin: 10.2 g/dL — ABNORMAL LOW (ref 12.0–15.0)
Immature Granulocytes: 0 %
Lymphocytes Relative: 30 %
Lymphs Abs: 1.4 10*3/uL (ref 0.7–4.0)
MCH: 32.9 pg (ref 26.0–34.0)
MCHC: 33 g/dL (ref 30.0–36.0)
MCV: 99.7 fL (ref 80.0–100.0)
Monocytes Absolute: 0.2 10*3/uL (ref 0.1–1.0)
Monocytes Relative: 4 %
Neutro Abs: 2.7 10*3/uL (ref 1.7–7.7)
Neutrophils Relative %: 58 %
Platelets: 137 10*3/uL — ABNORMAL LOW (ref 150–400)
RBC: 3.1 MIL/uL — ABNORMAL LOW (ref 3.87–5.11)
RDW: 13.2 % (ref 11.5–15.5)
WBC: 4.7 10*3/uL (ref 4.0–10.5)
nRBC: 0 % (ref 0.0–0.2)

## 2021-12-07 LAB — URINALYSIS, ROUTINE W REFLEX MICROSCOPIC
Bilirubin Urine: NEGATIVE
Glucose, UA: NEGATIVE mg/dL
Hgb urine dipstick: NEGATIVE
Ketones, ur: NEGATIVE mg/dL
Leukocytes,Ua: NEGATIVE
Nitrite: NEGATIVE
Protein, ur: NEGATIVE mg/dL
Specific Gravity, Urine: 1.005 (ref 1.005–1.030)
pH: 9 — ABNORMAL HIGH (ref 5.0–8.0)

## 2021-12-07 LAB — METHOTREXATE: Methotrexate: 0.08

## 2021-12-07 MED ORDER — LEUCOVORIN CALCIUM INJECTION 100 MG
15.0000 mg | Freq: Once | INTRAMUSCULAR | Status: AC
  Administered 2021-12-07: 16 mg via INTRAVENOUS
  Filled 2021-12-07: qty 0.8

## 2021-12-07 MED ORDER — POTASSIUM CHLORIDE CRYS ER 20 MEQ PO TBCR
40.0000 meq | EXTENDED_RELEASE_TABLET | Freq: Once | ORAL | Status: AC
  Administered 2021-12-07: 40 meq via ORAL
  Filled 2021-12-07: qty 2

## 2021-12-07 MED ORDER — POTASSIUM CHLORIDE 10 MEQ/100ML IV SOLN
10.0000 meq | INTRAVENOUS | Status: AC
  Administered 2021-12-07 (×2): 10 meq via INTRAVENOUS
  Filled 2021-12-07 (×2): qty 100

## 2021-12-07 NOTE — Discharge Summary (Signed)
Physician Discharge Summary  Daisy Oliver WUJ:[id number] DOB: 05/29/1935 DOA: 12/01/2021  PCP: [name], MD  Admit date: 12/01/2021 Discharge date: 12/07/2021  Recommendations for Outpatient Follow-up:  We will call patient to arrange next inpatient chemo admission, tentatively planning for Monday, September 11th.   Discharge Diagnoses:  Primary CNS lymphoma ([place]) - Plan: acetaminophen (TYLENOL) tablet 650 mg, senna-docusate (Senokot-S) tablet 1 tablet, ondansetron (ZOFRAN) tablet 4-8 mg, ondansetron (ZOFRAN-ODT) disintegrating tablet 4-8 mg, ondansetron (ZOFRAN) injection 4 mg, ondansetron (ZOFRAN) 8 mg in sodium chloride 0.9 % 50 mL IVPB, alum & mag hydroxide-simeth (MAALOX/MYLANTA) 200-200-20 MG/5ML suspension 30 mL, guaiFENesin-dextromethorphan (ROBITUSSIN DM) 100-10 MG/5ML syrup 10 mL, hydrocortisone (ANUSOL-HC) 2.5 % rectal cream 1 Application, enoxaparin (LOVENOX) injection 40 mg, traMADol (ULTRAM) tablet 25 mg, liothyronine (CYTOMEL) tablet 5 mcg, sodium chloride flush (NS) 0.9 % injection 10 mL, heparin lock flush 100 unit/mL, heparin lock flush 100 unit/mL, alteplase (CATHFLO ACTIVASE) injection 2 mg, sodium chloride flush (NS) 0.9 % injection 3 mL, 0.9 %  sodium chloride infusion, ondansetron (ZOFRAN) 16 mg, dexamethasone (DECADRON) 20 mg in sodium chloride 0.9 % 50 mL IVPB, methotrexate (PF) 6.265 g in dextrose 5 % 500 mL chemo infusion, leucovorin injection 16 mg, leucovorin injection 16 mg, leucovorin injection 16 mg, acetaminophen (TYLENOL) tablet 650 mg, diphenhydrAMINE (BENADRYL) capsule 50 mg, riTUXimab-pvvr (RUXIENCE) 700 mg in sodium chloride 0.9 % 180 mL infusion, melatonin tablet 10 mg, ONCOLOGY IP TX CONDITIONS, ONCOLOGY IP TX CONDITIONS, potassium chloride SA (KLOR-CON M) CR tablet 40 mEq, potassium chloride 10 mEq in 100 mL IVPB, traMADol (ULTRAM) tablet 25 mg, PHYSICIAN COMMUNICATION ORDER, PHYSICIAN COMMUNICATION ORDER, famotidine (PEPCID) IVPB 20 mg premix,  prochlorperazine (COMPAZINE) injection 10 mg, dexamethasone (DECADRON) injection 8 mg, leucovorin injection 16 mg, leucovorin injection 16 mg, DISCONTINUED: dextrose 5 % 1,000 mL with sodium bicarbonate 150 mEq infusion, DISCONTINUED: irbesartan (AVAPRO) tablet 37.5 mg, DISCONTINUED: dexamethasone (DECADRON) tablet 2 mg, DISCONTINUED: ondansetron (ZOFRAN) 16 mg, dexamethasone (DECADRON) 20 mg in sodium chloride 0.9 % 50 mL IVPB, DISCONTINUED: sodium bicarbonate 150 mEq in dextrose 5 % 1,150 mL infusion  Neoplasm of brain causing mass effect and brain compression on adjacent structures (H[facility]) - Plan: traMADol (ULTRAM) tablet 25 mg  Hypokalemia - Plan: potassium chloride SA (KLOR-CON M) CR tablet 40 mEq, potassium chloride SA (KLOR-CON M) CR tablet 40 mEq, potassium chloride 10 mEq in 100 mL IVPB, potassium chloride SA (KLOR-CON M) CR tablet 40 mEq, potassium chloride 10 mEq in 100 mL IVPB (principal)  Discharge Condition: Stable Disposition: Home  Diet recommendation: Heart Healthy  Filed Weights   12/01/21 1358  Weight: 151 lb 7.3 oz (68.7 kg)    History of present illness:  Daisy Oliver presented to medical attention in May 2023 with several weeks of progressive gait instability.  She describes feeling unsteady on her feet, even falling "a couple of times".  After one of the falls, ED visit led to CNS imaging which demonstrated multifocal masses in the right hemisphere.  She underwent stereotactic biopsy with Dr. Marcello Moores on 09/02/21; path demonstrated diffuse large B-cell lymphoma, CD20+.  Since surgery, she has tapered off the steroids without issue.  She feels "fairly normal" aside from pain in her knee and lower back.  She uses a cane to walk, mainly because of the knee pain.  No issues with frank weakness, no seizures.  She lives at home with her husband who has dementia.  Daughter is nearby in [place].   Today she presents for cycle #3 of methotrexate  and rituximab.  Hospital Course:   Patient was admitted on 12/01/21.  High dose IV methotrexate was administered without complication on 3/90.  Rituximab infusion was then administered on 8/24.  Leucovorin rescue was given every 6 hours starting 8/23 until methotrexate level safe for discharge on 8/27.  Today's assessment: S: No new complaints today O: Vitals:  Vitals:   12/06/21 2226 12/07/21 0522  BP: (!) 130/52 (!) 144/62  Pulse: (!) 53 (!) 58  Resp: 16 16  Temp: 98 F (36.7 C) 98.3 F (36.8 C)  SpO2: 93% 95%   KPS: 80. General: Alert, cooperative, pleasant, in no acute distress Head: Normal EENT: No conjunctival injection or scleral icterus.  Lungs: Resp effort normal Cardiac: Regular rate Abdomen: Non-distended abdomen Skin: No rashes cyanosis or petechiae. Extremities: No clubbing or edema   Neurologic Exam: Mental Status: Awake, alert, attentive to examiner. Oriented to self and environment. Language is fluent with intact comprehension.  Cranial Nerves: Visual acuity is grossly normal. Visual fields are full. Extra-ocular movements intact. No ptosis. Face is symmetric Motor: Tone and bulk are normal. Power is full in both arms and legs. Reflexes are symmetric, no pathologic reflexes present.  Sensory: Intact to light touch Gait: Orthopedic limitation, cane assisted    Discharge Instructions  We will schedule an MRI brain for 9/8, and then follow up in clinic 9/11 or 9/12 as discussed.    Allergies  Allergen Reactions   Barbiturates Other (See Comments)    Unknown reaction   Other Other (See Comments)    Patient has AIP and is not to have hormones or red wine   Pentothal [Thiopental] Other (See Comments)    Unknown reaction   Sulfa Antibiotics Other (See Comments)    Unknown reaction    The results of significant diagnostics from this hospitalization (including imaging, microbiology, ancillary and laboratory) are listed below for reference.    Significant Diagnostic Studies: No results  found.  Microbiology: No results found for this or any previous visit (from the past 240 hour(s)).   Labs: Basic Metabolic Panel: Recent Labs  Lab 12/03/21 0555 12/04/21 0456 12/05/21 0500 12/06/21 0500 12/07/21 0438  NA 140 142 141 142 141  K 3.1* 2.9* 3.2* 2.6* 2.9*  CL 98 95* 97* 95* 97*  CO2 34* 38* 36* 40* 37*  GLUCOSE 118* 108* 150* 98 96  BUN '19 12 12 9 '$ 7*  CREATININE 0.92 1.05* 1.08* 1.00 0.88  CALCIUM 8.7* 8.8* 8.7* 8.6* 8.7*   Liver Function Tests: Recent Labs  Lab 12/03/21 0555 12/04/21 0456 12/05/21 0500 12/06/21 0500 12/07/21 0438  AST 59* 58* 82* 171* 81*  ALT 59* 68* 78* 162* 113*  ALKPHOS 37* 42 40 39 39  BILITOT 1.6* 0.9 0.9 1.1 1.1  PROT 5.5* 5.8* 5.6* 5.6* 5.3*  ALBUMIN 2.9* 3.1* 2.8* 2.7* 2.6*   No results for input(s): "LIPASE", "AMYLASE" in the last 168 hours. No results for input(s): "AMMONIA" in the last 168 hours. CBC: Recent Labs  Lab 12/03/21 0555 12/04/21 0456 12/05/21 0500 12/06/21 0500 12/07/21 0438  WBC 10.1 7.8 6.3 6.4 4.7  NEUTROABS 8.6* 5.7 5.7 4.6 2.7  HGB 10.6* 11.2* 11.1* 10.7* 10.2*  HCT 30.3* 33.3* 32.7* 31.8* 30.9*  MCV 95.9 98.8 98.8 99.4 99.7  PLT 146* 155 162 164 137*   Cardiac Enzymes: No results for input(s): "CKTOTAL", "CKMB", "CKMBINDEX", "TROPONINI" in the last 168 hours. BNP: BNP (last 3 results) No results for input(s): "BNP" in the last 8760 hours.  ProBNP (last 3 results) No results for input(s): "PROBNP" in the last 8760 hours.  CBG: No results for input(s): "GLUCAP" in the last 168 hours.  Principal Problem:   Primary CNS lymphoma ([place]) Active Problems:   Hypokalemia   Time coordinating discharge: 68mn  Signed: ZCecil Cobbs MD

## 2021-12-07 NOTE — Progress Notes (Signed)
Patient discharged to home with family, discharge instructions reviewed with patient and daughter, who verbalized understanding.

## 2021-12-08 ENCOUNTER — Telehealth: Payer: Self-pay | Admitting: Internal Medicine

## 2021-12-08 NOTE — Telephone Encounter (Signed)
Scheduled appointment per 8/27 staff message. Patient is aware of appointment made.

## 2021-12-09 ENCOUNTER — Other Ambulatory Visit: Payer: Self-pay | Admitting: Radiation Therapy

## 2021-12-10 ENCOUNTER — Encounter: Payer: Self-pay | Admitting: Internal Medicine

## 2021-12-11 DIAGNOSIS — I129 Hypertensive chronic kidney disease with stage 1 through stage 4 chronic kidney disease, or unspecified chronic kidney disease: Secondary | ICD-10-CM | POA: Diagnosis not present

## 2021-12-11 DIAGNOSIS — N182 Chronic kidney disease, stage 2 (mild): Secondary | ICD-10-CM | POA: Diagnosis not present

## 2021-12-18 NOTE — Progress Notes (Signed)
   [facility] is the home-based palliative care program of [facility].  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 She has hired caregivers providing care in the home for several hours Mon- Fri. She has complaint of pain in her legs and knees which respond to Tylenol and non-pharmacological interventions. She is sleeping well with melatonin and Tylenol at hs, except for getting up 2-3 times a night to void. She uses walker with ambulation. She has lower extremity edema. She was very fearful of chemo based on "stories from friends who had chemo in the past" but has tolerated treatments well. Interventions include goals of care discussions, teaching/assessment of medical conditions, discussion of available resources, medication teaching, coordination of care with medical providers and supportive counseling. She requested and was provided at DNR at the time of admission. She remains realistic about her health conditions. She is anxious to get results of upcoming scans to assess response to treatment so that she can determine next steps. She has said that she would not want to continue treatment if she was not seeing a positive response. She continues outpatient physical therapy in efforts to maximize functional status.  Signs of decline over last 3 months: none- mobility and cognitive status have shown some improvement from the time of  admission.  Goals of Care: to continue to be independent as possible and focus on quality of life, and to continue treatment if it supports QOL.  Social Work Interventions: Remains available for PRN phone support or visits Advance Directives: DNR  Thanks for allowing [place] to participate with you in this patient's care. Jeanne Ivan, RN [facility]

## 2021-12-19 ENCOUNTER — Ambulatory Visit (HOSPITAL_COMMUNITY)
Admission: RE | Admit: 2021-12-19 | Discharge: 2021-12-19 | Disposition: A | Discharge status: Home / Self Care | Payer: Medicare PPO | Source: Ambulatory Visit | Attending: Internal Medicine | Admitting: Internal Medicine

## 2021-12-19 DIAGNOSIS — Q283 Other malformations of cerebral vessels: Secondary | ICD-10-CM | POA: Diagnosis not present

## 2021-12-19 DIAGNOSIS — C8589 Other specified types of non-Hodgkin lymphoma, extranodal and solid organ sites: Secondary | ICD-10-CM | POA: Diagnosis not present

## 2021-12-19 DIAGNOSIS — C8339 Diffuse large B-cell lymphoma, extranodal and solid organ sites: Secondary | ICD-10-CM | POA: Diagnosis not present

## 2021-12-19 DIAGNOSIS — D496 Neoplasm of unspecified behavior of brain: Secondary | ICD-10-CM | POA: Diagnosis not present

## 2021-12-19 MED ORDER — GADOBUTROL 1 MMOL/ML IV SOLN
7.0000 mL | Freq: Once | INTRAVENOUS | Status: AC | PRN
  Administered 2021-12-19: 7 mL via INTRAVENOUS

## 2021-12-22 ENCOUNTER — Inpatient Hospital Stay: Payer: Medicare PPO | Attending: Neurosurgery

## 2021-12-22 DIAGNOSIS — Z79899 Other long term (current) drug therapy: Secondary | ICD-10-CM | POA: Insufficient documentation

## 2021-12-22 DIAGNOSIS — Z8261 Family history of arthritis: Secondary | ICD-10-CM | POA: Insufficient documentation

## 2021-12-22 DIAGNOSIS — E039 Hypothyroidism, unspecified: Secondary | ICD-10-CM | POA: Insufficient documentation

## 2021-12-22 DIAGNOSIS — Z8269 Family history of other diseases of the musculoskeletal system and connective tissue: Secondary | ICD-10-CM | POA: Insufficient documentation

## 2021-12-22 DIAGNOSIS — C8339 Diffuse large B-cell lymphoma, extranodal and solid organ sites: Secondary | ICD-10-CM | POA: Insufficient documentation

## 2021-12-22 DIAGNOSIS — Z79630 Long term (current) use of alkylating agent: Secondary | ICD-10-CM | POA: Insufficient documentation

## 2021-12-22 DIAGNOSIS — M545 Low back pain, unspecified: Secondary | ICD-10-CM | POA: Insufficient documentation

## 2021-12-22 DIAGNOSIS — I10 Essential (primary) hypertension: Secondary | ICD-10-CM | POA: Insufficient documentation

## 2021-12-22 DIAGNOSIS — Z8262 Family history of osteoporosis: Secondary | ICD-10-CM | POA: Insufficient documentation

## 2021-12-22 DIAGNOSIS — Z5112 Encounter for antineoplastic immunotherapy: Secondary | ICD-10-CM | POA: Insufficient documentation

## 2021-12-22 DIAGNOSIS — M25569 Pain in unspecified knee: Secondary | ICD-10-CM | POA: Insufficient documentation

## 2021-12-22 DIAGNOSIS — R2681 Unsteadiness on feet: Secondary | ICD-10-CM | POA: Insufficient documentation

## 2021-12-22 DIAGNOSIS — Z882 Allergy status to sulfonamides status: Secondary | ICD-10-CM | POA: Insufficient documentation

## 2021-12-22 DIAGNOSIS — Q759 Congenital malformation of skull and face bones, unspecified: Secondary | ICD-10-CM | POA: Insufficient documentation

## 2021-12-22 DIAGNOSIS — Z8616 Personal history of COVID-19: Secondary | ICD-10-CM | POA: Insufficient documentation

## 2021-12-22 DIAGNOSIS — Z8249 Family history of ischemic heart disease and other diseases of the circulatory system: Secondary | ICD-10-CM | POA: Insufficient documentation

## 2021-12-22 DIAGNOSIS — Z809 Family history of malignant neoplasm, unspecified: Secondary | ICD-10-CM | POA: Insufficient documentation

## 2021-12-22 DIAGNOSIS — Z87891 Personal history of nicotine dependence: Secondary | ICD-10-CM | POA: Insufficient documentation

## 2021-12-22 DIAGNOSIS — M7989 Other specified soft tissue disorders: Secondary | ICD-10-CM | POA: Insufficient documentation

## 2021-12-22 DIAGNOSIS — R2689 Other abnormalities of gait and mobility: Secondary | ICD-10-CM | POA: Insufficient documentation

## 2021-12-22 DIAGNOSIS — Z818 Family history of other mental and behavioral disorders: Secondary | ICD-10-CM | POA: Insufficient documentation

## 2021-12-22 DIAGNOSIS — Z79890 Hormone replacement therapy (postmenopausal): Secondary | ICD-10-CM | POA: Insufficient documentation

## 2021-12-22 DIAGNOSIS — Z808 Family history of malignant neoplasm of other organs or systems: Secondary | ICD-10-CM | POA: Insufficient documentation

## 2021-12-23 ENCOUNTER — Encounter: Payer: Self-pay | Admitting: Internal Medicine

## 2021-12-23 ENCOUNTER — Inpatient Hospital Stay: Payer: Medicare PPO | Admitting: Internal Medicine

## 2021-12-23 ENCOUNTER — Other Ambulatory Visit: Payer: Self-pay

## 2021-12-23 VITALS — BP 121/68 | HR 88 | Temp 98.3°F | Resp 16 | Ht 64.0 in | Wt 148.8 lb

## 2021-12-23 DIAGNOSIS — C8589 Other specified types of non-Hodgkin lymphoma, extranodal and solid organ sites: Secondary | ICD-10-CM | POA: Diagnosis not present

## 2021-12-23 DIAGNOSIS — C8339 Diffuse large B-cell lymphoma, extranodal and solid organ sites: Secondary | ICD-10-CM | POA: Diagnosis not present

## 2021-12-23 DIAGNOSIS — Z882 Allergy status to sulfonamides status: Secondary | ICD-10-CM | POA: Diagnosis not present

## 2021-12-23 DIAGNOSIS — Z808 Family history of malignant neoplasm of other organs or systems: Secondary | ICD-10-CM | POA: Diagnosis not present

## 2021-12-23 DIAGNOSIS — M7989 Other specified soft tissue disorders: Secondary | ICD-10-CM | POA: Diagnosis not present

## 2021-12-23 DIAGNOSIS — Z8262 Family history of osteoporosis: Secondary | ICD-10-CM | POA: Diagnosis not present

## 2021-12-23 DIAGNOSIS — R2689 Other abnormalities of gait and mobility: Secondary | ICD-10-CM | POA: Diagnosis not present

## 2021-12-23 DIAGNOSIS — Z818 Family history of other mental and behavioral disorders: Secondary | ICD-10-CM | POA: Diagnosis not present

## 2021-12-23 DIAGNOSIS — Z79899 Other long term (current) drug therapy: Secondary | ICD-10-CM | POA: Diagnosis not present

## 2021-12-23 DIAGNOSIS — Z8269 Family history of other diseases of the musculoskeletal system and connective tissue: Secondary | ICD-10-CM | POA: Diagnosis not present

## 2021-12-23 DIAGNOSIS — Z8616 Personal history of COVID-19: Secondary | ICD-10-CM | POA: Diagnosis not present

## 2021-12-23 DIAGNOSIS — M25569 Pain in unspecified knee: Secondary | ICD-10-CM | POA: Diagnosis not present

## 2021-12-23 DIAGNOSIS — E039 Hypothyroidism, unspecified: Secondary | ICD-10-CM | POA: Diagnosis not present

## 2021-12-23 DIAGNOSIS — Z5112 Encounter for antineoplastic immunotherapy: Secondary | ICD-10-CM | POA: Diagnosis not present

## 2021-12-23 DIAGNOSIS — Z8249 Family history of ischemic heart disease and other diseases of the circulatory system: Secondary | ICD-10-CM | POA: Diagnosis not present

## 2021-12-23 DIAGNOSIS — M545 Low back pain, unspecified: Secondary | ICD-10-CM | POA: Diagnosis not present

## 2021-12-23 DIAGNOSIS — Z79890 Hormone replacement therapy (postmenopausal): Secondary | ICD-10-CM | POA: Diagnosis not present

## 2021-12-23 DIAGNOSIS — Z79630 Long term (current) use of alkylating agent: Secondary | ICD-10-CM | POA: Diagnosis not present

## 2021-12-23 DIAGNOSIS — I10 Essential (primary) hypertension: Secondary | ICD-10-CM | POA: Diagnosis not present

## 2021-12-23 DIAGNOSIS — Z87891 Personal history of nicotine dependence: Secondary | ICD-10-CM | POA: Diagnosis not present

## 2021-12-23 DIAGNOSIS — Z8261 Family history of arthritis: Secondary | ICD-10-CM | POA: Diagnosis not present

## 2021-12-23 DIAGNOSIS — Z809 Family history of malignant neoplasm, unspecified: Secondary | ICD-10-CM | POA: Diagnosis not present

## 2021-12-23 DIAGNOSIS — R2681 Unsteadiness on feet: Secondary | ICD-10-CM | POA: Diagnosis not present

## 2021-12-23 DIAGNOSIS — Q759 Congenital malformation of skull and face bones, unspecified: Secondary | ICD-10-CM | POA: Diagnosis not present

## 2021-12-23 NOTE — Progress Notes (Signed)
[facility] [place], [place] [address] [phone]   Interval Evaluation  Date of Service: 12/23/21 Patient Name: Daisy Oliver Patient MRN: [id number] Patient DOB: [birth year] Provider: [name], MD  Identifying Statement:  Daisy Oliver is a 86 y.o. female with right frontal  CNS lymphoma    Oncologic History: Oncology History  Primary CNS lymphoma ([place])  09/02/2021 Surgery   Stereotactic biopsy by Dr. Marcello Moores; path is B-cell lymphoma CD20+   09/30/2021 -  Chemotherapy   Patient is on Treatment Plan : IP NON-HODGKINS LYMPHOMA High Dose Methotrexate + Leucovorin Rescue     10/02/2021 -  Chemotherapy   Patient is on Treatment Plan : NON-HODGKINS LYMPHOMA Rituximab q21d       Biomarkers:  CD20 positive .  Ki-67 50% .   Unknown   Unknown   Interval History: Daisy Oliver presents for follow up today, now having completed 3 cycles of methotrexate and rituximab.  She had difficulty with leg swelling following the last treatment cycle, but this is now improved.  She also experienced the same GI distress and diarrhea as after the first 2 cycles, this has also resolved.  Currently independent with gait and functional status.     H+P (09/16/21) Patient presented to medical attention in May 2023 with several weeks of progressive gait instability.  She describes feeling unsteady on her feet, even falling "a couple of times".  After one of the falls, ED visit led to CNS imaging which demonstrated multifocal masses in the right hemisphere.  She underwent stereotactic biopsy with Dr. Marcello Moores on 09/02/21; path demonstrated diffuse large B-cell lymphoma, CD20+.  Since surgery, she has tapered off the steroids without issue.  She feels "fairly normal" aside from pain in her knee and lower back.  She uses a cane to walk, mainly because of the knee pain.  No issues with frank weakness, no seizures.  She lives at home with  her husband who has dementia.  Daughter is nearby in [place].  Medications: Current Outpatient Medications on File Prior to Visit  Medication Sig Dispense Refill   acetaminophen (TYLENOL) 500 MG tablet Take 500 mg by mouth 2 (two) times daily as needed (pain).     Ascorbic Acid (VITAMIN C PO) Take 1 tablet by mouth daily.     BIOTIN PO Take 1 tablet by mouth 2 (two) times daily.     Emollient (CERAVE HEALING) OINT Apply 1 Application topically 2 (two) times daily as needed (wound care/dry skin). (Patient not taking: Reported on 12/01/2021)     famotidine (PEPCID) 20 MG tablet Take 20 mg by mouth at bedtime as needed for indigestion.     liothyronine (CYTOMEL) 5 MCG tablet Take 2 tablets (10 mcg total) by mouth See admin instructions. Take 10 mcg before breakfast. (Patient taking differently: Take 5 mcg by mouth in the morning and at bedtime.) 180 tablet 3   MAGNESIUM PO Take 1 capsule by mouth 2 (two) times daily.     Melatonin 10 MG TABS Take 10 mg by mouth at bedtime.     Menthol, Topical Analgesic, (BIOFREEZE EX) Apply 1 Application topically at bedtime as needed (leg pain).     Menthol, Topical Analgesic, (BIOFREEZE) 5 % PTCH Apply 1 patch topically as needed (back pain).     Misc Natural Products (OSTEO BI-FLEX JOINT SHIELD PO) Take 1 tablet by mouth 2 (two) times daily.     Multiple Vitamin (  MULTIVITAMIN WITH MINERALS) TABS tablet Take 1 tablet by mouth 2 (two) times daily.     Multiple Vitamins-Minerals (ZINC PO) Take 1 tablet by mouth 2 (two) times daily.     nystatin cream (MYCOSTATIN) Apply 1 Application topically as needed for dry skin (fever blisters).     olmesartan (BENICAR) 20 MG tablet Take 1/2 tablet  Daily  for BP (Patient taking differently: Take 10 mg by mouth in the morning and at bedtime.) 45 tablet 3   Polyethyl Glycol-Propyl Glycol (SYSTANE) 0.4-0.3 % GEL ophthalmic gel Place 1 Application into both eyes at bedtime.     TAURINE PO Take 1 capsule by mouth daily.      tretinoin (RETIN-A) 0.05 % cream Apply topically at bedtime. (Patient taking differently: Apply 1 application  topically See admin instructions. 1 application at bedtime on Monday, Wednesday, Friday) 45 g 3   No current facility-administered medications on file prior to visit.    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 Herbert Deaner   FRAMELESS  BIOPSY WITH BRAINLAB Right 09/02/2021   Procedure: FRONTAL STEREOTACTIC BRAIN BIOPSY;  Surgeon: Vallarie Mare, MD;  Location: [facility];  Service: Neurosurgery;  Laterality: Right;   IR IMAGING GUIDED PORT INSERTION  09/24/2021   RADIAL HEAD ARTHROPLASTY Right 09/16/2012   Procedure: RADIAL HEAD ARTHROPLASTY;  Surgeon: Johnny [facility], MD;  Location: [facility];  Service: Orthopedics;  Laterality: Right;  RADIAL HEAD REPLACEMENT     Social History:  Social History   Socioeconomic History   Marital status: Married    Spouse name: Not on file   Number of children: 2   Years of education: Not on file   Highest education  level: Not on file  Occupational History   Not on file  Tobacco Use   Smoking status: Former    Packs/day: 2.00    Years: 22.00    Total pack years: 44.00    Types: Cigarettes    Quit date: 09/17/1983    Years since quitting: 38.2   Smokeless tobacco: Never  Vaping Use   Vaping Use: Never used  Substance and Sexual Activity   Alcohol use: Not Currently    Alcohol/week: 0.0 - 1.0 standard drinks of alcohol    Comment: 2/month   Drug use: No   Sexual activity: Not Currently  Other Topics Concern   Not on file  Social History Narrative   Not on file   Social Determinants of Health  Financial Resource Strain: Not on file  Food Insecurity: Not on file  Transportation Needs: Not on file  Physical Activity: Sufficiently Active (09/14/2017)   Exercise Vital Sign    Days of Exercise per Week: 5 days    Minutes of Exercise per Session: 60 min  Stress: No Stress Concern Present (09/14/2017)   [place]    Feeling of Stress : Only a little  Social Connections: Not on file  Intimate Partner Violence: Not on file   Family History:  Family History  Problem Relation Age of Onset   Heart disease Mother    Brain cancer Father 54       Brain tumor, not cancerous   Hypertension Sister    Osteoporosis Sister    Arthritis Sister    Scoliosis Sister    Hypertension Sister    Osteoporosis Sister    Cancer Paternal Grandfather    Suicidality Paternal Grandfather    Breast cancer Neg Hx     Review of Systems: Constitutional: Doesn't report fevers, chills or abnormal weight loss Eyes: Doesn't report blurriness of vision Ears, nose, mouth, throat, and face: Doesn't report sore throat Respiratory: Doesn't report cough, dyspnea or wheezes Cardiovascular: Doesn't report palpitation, chest discomfort  Gastrointestinal:  Doesn't report nausea, constipation, diarrhea GU: Doesn't report incontinence Skin: Doesn't report skin  rashes Neurological: Per HPI Musculoskeletal: Doesn't report joint pain Behavioral/Psych: Doesn't report anxiety  Physical Exam: Vitals:   12/23/21 1243  BP: 121/68  Pulse: 88  Resp: 16  Temp: 98.3 F (36.8 C)  SpO2: 99%    KPS: 80. General: Alert, cooperative, pleasant, in no acute distress Head: Normal EENT: No conjunctival injection or scleral icterus.  Lungs: Resp effort normal Cardiac: Regular rate Abdomen: Non-distended abdomen Skin: No rashes cyanosis or petechiae. Extremities: No clubbing or edema  Neurologic Exam: Mental Status: Awake, alert, attentive to examiner. Oriented to self and environment. Language is fluent with intact comprehension.  Cranial Nerves: Visual acuity is grossly normal. Visual fields are full. Extra-ocular movements intact. No ptosis. Face is symmetric Motor: Tone and bulk are normal. Power is full in both arms and legs. Reflexes are symmetric, no pathologic reflexes present.  Sensory: Intact to light touch Gait: Orthopedic limitation, cane assisted   Labs: I have reviewed the data as listed    Component Value Date/Time   NA 141 12/07/2021 0438   K 2.9 (L) 12/07/2021 0438   CL 97 (L) 12/07/2021 0438   CO2 37 (H) 12/07/2021 0438   GLUCOSE 96 12/07/2021 0438   BUN 7 (L) 12/07/2021 0438   CREATININE 0.88 12/07/2021 0438   CREATININE 0.85 11/05/2021 1619   CALCIUM 8.7 (L) 12/07/2021 0438   PROT 5.3 (L) 12/07/2021 0438   ALBUMIN 2.6 (L) 12/07/2021 0438   AST 81 (H) 12/07/2021 0438   ALT 113 (H) 12/07/2021 0438   ALKPHOS 39 12/07/2021 0438   BILITOT 1.1 12/07/2021 0438   GFRNONAA >60 12/07/2021 0438   GFRNONAA 66 09/19/2020 0000   GFRAA 76 09/19/2020 0000   Lab Results  Component Value Date   WBC 4.7 12/07/2021   NEUTROABS 2.7 12/07/2021   HGB 10.2 (L) 12/07/2021   HCT 30.9 (L) 12/07/2021   MCV 99.7 12/07/2021   PLT 137 (L) 12/07/2021    Imaging:  [facility] Clinician Interpretation: I have personally reviewed the CNS images as  listed.  My interpretation, in the context of the patient's clinical presentation, is stable disease  MR BRAIN  W WO CONTRAST  Result Date: 12/21/2021 CLINICAL DATA:  86 year old female with CNS Lymphoma, Diffuse large B-cell type on stereotactic frontal brain biopsy in May. Restaging. EXAM: MRI HEAD WITHOUT AND WITH CONTRAST TECHNIQUE: Multiplanar, multiecho pulse sequences of the brain and surrounding structures were obtained without and with intravenous contrast. CONTRAST:  46m GADAVIST GADOBUTROL 1 MMOL/ML IV SOLN COMPARISON:  Pre biopsy MRI 08/28/2021 and earlier. FINDINGS: Brain: Resolved intracranial mass effect and midline shift. Slight ex vacuo appearing enlargement of the right lateral ventricle now in the area of previous infiltrative tumor (series 11, image 30). Regressed multifocal abnormal enhancement in the right frontal lobe, with residual in the anterior superior frontal gyrus (16 mm series 16, image 106), right anterior corona radiata, right anterior insula and operculum (discontinuous now in an area of about 2 cm series 17, image 18), and a small 4-5 mm nodular area in the posterior right lentiform series 16, image 76. Corresponding regressed abnormal T2 and FLAIR signal in these areas with mild to moderate patchy residual. And there is new hemosiderin in the areas of residual enhancing treated tumor. Mild residual diffusion abnormality in those areas, which might in part be susceptibility related. No new areas of abnormal enhancement. No new or increased hemispheric T2 or FLAIR hyperintensity. However, there is patchy increased T2 and FLAIR hyperintensity in the pons (series 11, image 16). But there is no associated mass effect, enhancement, or hemosiderin. Diffusion appears facilitated. No restricted diffusion suggestive of acute infarction. No ventriculomegaly, extra-axial collection or acute intracranial hemorrhage. Cervicomedullary junction and pituitary are within normal limits. No dural  thickening. Vascular: Major intracranial vascular flow voids are stable. The major dural venous sinuses are enhancing and appear to be patent. 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 Adjacent segment cervical spine degeneration. Visualized bone marrow signal is within normal limits. Stable visible spinal cord. Sinuses/Orbits: Stable and negative. Other: Mild left mastoid effusion appears stable. Negative visible nasopharynx. Visible internal auditory structures appear normal. Negative visible scalp and face. IMPRESSION: 1. Regression of CNS Lymphoma since May. Resolved intracranial mass effect. Patchy residual enhancing tumor in the right frontal lobe. 2. Nonspecific new T2/FLAIR heterogeneity in the pons, but no other signal changes or mass effect there to strongly suggest treated or active tumor. Electronically Signed   By: HGenevie AnnM.D.   On: 12/21/2021 11:14      Assessment/Plan [place] CNS [place] ([place]  LRemonia RichterWHipolitois clinically stable today.  MRI brain demonstrates improvement in burden of enhancing disease and mass effect.  There is residual enhancement, particularly in the right frontal lobe.   We discussed goals of care today extensively.  The week-long admissions have been difficult for her, and side effects of methotrexate have been noted.  We discussed alternate approaches to further HD-MTX, including whole brain radiation and 5-day oral Temodar.  Rituximab could be continued during oral chemo.  Her goal is to "make it the spring to see her garden one more time".    After our discussion today, she will review goals of care at home with her family.  She will reach out to uKoreawith a decision in the next few days.    She will remain off decadron in the interim.    We ask that LJosephina Gipreturn to clinic following GSchurzdecision.  All questions were answered. The patient knows to call the clinic with any problems,  questions or concerns. No barriers to learning were  detected.  The total time spent in the encounter was 40 minutes and more than 50% was on counseling and review of test results   [name], MD Medical Director of Neuro-Oncology [facility] at [place] 12/23/21 12:39 PM

## 2021-12-25 ENCOUNTER — Other Ambulatory Visit (HOSPITAL_COMMUNITY): Payer: Self-pay

## 2021-12-25 ENCOUNTER — Telehealth: Payer: Self-pay | Admitting: Pharmacy Technician

## 2021-12-25 ENCOUNTER — Other Ambulatory Visit: Payer: Self-pay | Admitting: Internal Medicine

## 2021-12-25 ENCOUNTER — Telehealth: Payer: Self-pay

## 2021-12-25 DIAGNOSIS — C8589 Other specified types of non-Hodgkin lymphoma, extranodal and solid organ sites: Secondary | ICD-10-CM

## 2021-12-25 MED ORDER — TEMOZOLOMIDE 140 MG PO CAPS
280.0000 mg | ORAL_CAPSULE | Freq: Every day | ORAL | 0 refills | Status: DC
  Filled 2021-12-25: qty 14, 7d supply, fill #0

## 2021-12-25 MED ORDER — ONDANSETRON HCL 8 MG PO TABS
8.0000 mg | ORAL_TABLET | Freq: Three times a day (TID) | ORAL | 1 refills | Status: DC | PRN
  Filled 2021-12-25: qty 30, 10d supply, fill #0

## 2021-12-25 NOTE — Telephone Encounter (Signed)
Oral Oncology Patient Advocate Encounter   Received notification that prior authorization for Temozolomide is required.   PA submitted on 12/25/2021 Key [id number] Status is pending     Lady Deutscher, CPhT-Adv Oncology Pharmacy Patient [place] Direct Number: [phone]  Fax: [phone]

## 2021-12-25 NOTE — Progress Notes (Signed)
DISCONTINUE ON PATHWAY REGIMEN - Neuro     Cycle 1: A cycle is 28 days:     Methotrexate      Leucovorin      Rituximab-xxxx      Temozolomide    Cycle 2: A cycle is 28 days:     Methotrexate      Leucovorin      Rituximab-xxxx      Temozolomide    Cycles 3 and 4: A cycle is every 28 days:     Methotrexate      Leucovorin      Temozolomide   **Always confirm dose/schedule in your pharmacy ordering system**  REASON: Toxicities / Adverse Event PRIOR TREATMENT: [id number]: MT [High-Dose Methotrexate 8 grams/m2 IV D1, 15 + Temozolomide 150 mg/m2 PO Daily D7-11 q28 Days for up to 4 Cycles] + R [Rituximab 375 mg/m2 IV Weekly for up to a Total of 6 Doses] TREATMENT RESPONSE: Stable Disease (SD)  START OFF PATHWAY REGIMEN - Neuro   [id number]:Temozolomide 150/200 mg/m2 D1-5 q28 Days:   A cycle is every 28 days:     Temozolomide      Temozolomide   **Always confirm dose/schedule in your pharmacy ordering system**  Patient Characteristics: Primary Large B-Cell Lymphoma of the CNS, Newly Diagnosed, Consolidation / Maintenance Disease Classification: Primary Large B-Cell Lymphoma of the CNS Disease Status: Newly Diagnosed Intent of Therapy: Non-Curative / Palliative Intent, Discussed with Patient

## 2021-12-25 NOTE — Telephone Encounter (Signed)
error 

## 2021-12-26 ENCOUNTER — Other Ambulatory Visit (HOSPITAL_COMMUNITY): Payer: Self-pay

## 2021-12-26 NOTE — Telephone Encounter (Signed)
Oral Oncology Patient Advocate Encounter  Prior Authorization for Temozolomide has been approved.    PA# [id number] Effective dates: 12/25/2021 through 06/24/2022  Patients co-pay is $50.    Lady Deutscher, CPhT-Adv Oncology Pharmacy Patient [place] Direct Number: [phone]  Fax: [phone]

## 2021-12-29 ENCOUNTER — Telehealth: Payer: Self-pay | Admitting: Pharmacist

## 2021-12-29 ENCOUNTER — Other Ambulatory Visit (HOSPITAL_COMMUNITY): Payer: Self-pay

## 2021-12-29 DIAGNOSIS — C8589 Other specified types of non-Hodgkin lymphoma, extranodal and solid organ sites: Secondary | ICD-10-CM

## 2021-12-29 MED ORDER — TEMOZOLOMIDE 140 MG PO CAPS
280.0000 mg | ORAL_CAPSULE | Freq: Every day | ORAL | 0 refills | Status: DC
  Filled 2021-12-29: qty 14, 7d supply, fill #0
  Filled 2021-12-29: qty 10, 5d supply, fill #0
  Filled 2021-12-30: qty 10, 28d supply, fill #0

## 2021-12-29 NOTE — Telephone Encounter (Signed)
Oral Oncology Pharmacist Encounter  Received new prescription for Temodar (temozolomide) for the treatment of CNS lymphoma in conjunction with rituximab, planned duration until disease progression or unacceptable drug toxicity.  CMP and CBC w/ Diff from 12/07/21 assessed, no baseline dose adjustments required for temozolomide.  Current medication list in Epic reviewed, no relevant/significant DDIs with Temodar identified.  Evaluated chart and no patient barriers to medication adherence noted.   Patient agreement for treatment documented in MD note on 12/25/21.  Prescription has been e-scribed to the [facility] for benefits analysis and approval. Plan for patient to start after rituximab infusion is scheduled.   Oral Oncology Clinic will continue to follow for insurance authorization, copayment issues, initial counseling and start date.  Leron Croak, PharmD, BCPS, [facility] Hematology/Oncology Clinical Pharmacist [facility] and [place] [phone] 12/29/2021 8:26 AM

## 2021-12-29 NOTE — Telephone Encounter (Signed)
Oral Chemotherapy Pharmacist Encounter   Attempted to reach patient to provide update and offer for initial counseling on oral medication: Temodar (temozolomide).   No answer. Left voicemail for patient to call back to discuss details of medication acquisition and initial counseling session.  Leron Croak, PharmD, BCPS, [facility] Hematology/Oncology Clinical Pharmacist [facility] and [place] [phone] 12/29/2021 10:32 AM

## 2021-12-30 ENCOUNTER — Other Ambulatory Visit (HOSPITAL_COMMUNITY): Payer: Self-pay

## 2021-12-30 NOTE — Telephone Encounter (Signed)
Oral Chemotherapy Pharmacist Encounter  I spoke with patient for overview of: Temodar (temozolomide) for the treatment of CNS lymphoma in conjunction with rituximab, planned duration until disease progression or unacceptable drug toxicity.  Counseled on administration, dosing, side effects, monitoring, drug-food interactions, safe handling, storage, and disposal.  Patient will take Temodar '140mg'$  capsules, 2 capsules, '280mg'$  total daily dose, by mouth once daily, may take at bedtime and on an empty stomach to decrease nausea and vomiting.  Patient will take Temodar daily for 5 days on, 23 days off, and repeated.  Temodar start date: Patient will not start until instructed by Dr. Mickeal Skinner at appt on 01/01/22.    Adverse effects include but are not limited to: nausea, vomiting, GI upset, rash, and fatigue.  Patient will take Zofran '8mg'$  tablet, 1 tablet by mouth 30-60 min prior to Temodar dose to help decrease N/V. We discussed strategies to manage constipation if they occur secondary to ondansetron dosing.  Reviewed importance of keeping a medication schedule and plan for any missed doses. No barriers to medication adherence identified.  Medication reconciliation performed and medication/allergy list updated.  Insurance authorization for Temodar has been obtained. Patient will pick this up at [facility] on 01/01/22.   All questions answered.  Ms. Mcvicker voiced understanding and appreciation.   Medication education handout placed in mail for patient. Patient knows to call the office with questions or concerns. Oral Chemotherapy Clinic phone number provided to patient.   Leron Croak, PharmD, BCPS, [facility]OP Hematology/Oncology Clinical Pharmacist [facility] [place] [phone] 12/30/2021 11:12 AM

## 2022-01-01 ENCOUNTER — Other Ambulatory Visit: Payer: Self-pay

## 2022-01-01 ENCOUNTER — Encounter: Payer: Self-pay | Admitting: Internal Medicine

## 2022-01-01 ENCOUNTER — Inpatient Hospital Stay: Payer: Medicare PPO | Admitting: Internal Medicine

## 2022-01-01 ENCOUNTER — Inpatient Hospital Stay: Payer: Medicare PPO

## 2022-01-01 VITALS — BP 114/60 | HR 69 | Temp 97.7°F | Resp 14 | Wt 150.9 lb

## 2022-01-01 VITALS — BP 128/68 | HR 66 | Temp 97.8°F | Resp 18

## 2022-01-01 DIAGNOSIS — I10 Essential (primary) hypertension: Secondary | ICD-10-CM | POA: Diagnosis not present

## 2022-01-01 DIAGNOSIS — Z5112 Encounter for antineoplastic immunotherapy: Secondary | ICD-10-CM | POA: Diagnosis not present

## 2022-01-01 DIAGNOSIS — C8589 Other specified types of non-Hodgkin lymphoma, extranodal and solid organ sites: Secondary | ICD-10-CM

## 2022-01-01 DIAGNOSIS — R2689 Other abnormalities of gait and mobility: Secondary | ICD-10-CM | POA: Diagnosis not present

## 2022-01-01 DIAGNOSIS — E039 Hypothyroidism, unspecified: Secondary | ICD-10-CM | POA: Diagnosis not present

## 2022-01-01 DIAGNOSIS — M545 Low back pain, unspecified: Secondary | ICD-10-CM | POA: Diagnosis not present

## 2022-01-01 DIAGNOSIS — M7989 Other specified soft tissue disorders: Secondary | ICD-10-CM | POA: Diagnosis not present

## 2022-01-01 DIAGNOSIS — R2681 Unsteadiness on feet: Secondary | ICD-10-CM | POA: Diagnosis not present

## 2022-01-01 DIAGNOSIS — C8339 Diffuse large B-cell lymphoma, extranodal and solid organ sites: Secondary | ICD-10-CM | POA: Diagnosis not present

## 2022-01-01 DIAGNOSIS — M25569 Pain in unspecified knee: Secondary | ICD-10-CM | POA: Diagnosis not present

## 2022-01-01 LAB — CMP (CANCER CENTER ONLY)
ALT: 15 U/L (ref 0–44)
AST: 25 U/L (ref 15–41)
Albumin: 3.6 g/dL (ref 3.5–5.0)
Alkaline Phosphatase: 53 U/L (ref 38–126)
Anion gap: 5 (ref 5–15)
BUN: 15 mg/dL (ref 8–23)
CO2: 30 mmol/L (ref 22–32)
Calcium: 10.2 mg/dL (ref 8.9–10.3)
Chloride: 104 mmol/L (ref 98–111)
Creatinine: 1.07 mg/dL — ABNORMAL HIGH (ref 0.44–1.00)
GFR, Estimated: 51 mL/min — ABNORMAL LOW (ref >60)
Glucose, Bld: 155 mg/dL — ABNORMAL HIGH (ref 70–99)
Potassium: 3.4 mmol/L — ABNORMAL LOW (ref 3.5–5.1)
Sodium: 139 mmol/L (ref 135–145)
Total Bilirubin: 0.6 mg/dL (ref 0.3–1.2)
Total Protein: 6.6 g/dL (ref 6.5–8.1)

## 2022-01-01 LAB — CBC WITH DIFFERENTIAL (CANCER CENTER ONLY)
Abs Immature Granulocytes: 0.04 10*3/uL (ref 0.00–0.07)
Basophils Absolute: 0.1 10*3/uL (ref 0.0–0.1)
Basophils Relative: 1 %
Eosinophils Absolute: 0.6 10*3/uL — ABNORMAL HIGH (ref 0.0–0.5)
Eosinophils Relative: 9 %
HCT: 33.2 % — ABNORMAL LOW (ref 36.0–46.0)
Hemoglobin: 11 g/dL — ABNORMAL LOW (ref 12.0–15.0)
Immature Granulocytes: 1 %
Lymphocytes Relative: 19 %
Lymphs Abs: 1.3 10*3/uL (ref 0.7–4.0)
MCH: 32.3 pg (ref 26.0–34.0)
MCHC: 33.1 g/dL (ref 30.0–36.0)
MCV: 97.4 fL (ref 80.0–100.0)
Monocytes Absolute: 1 10*3/uL (ref 0.1–1.0)
Monocytes Relative: 14 %
Neutro Abs: 4 10*3/uL (ref 1.7–7.7)
Neutrophils Relative %: 56 %
Platelet Count: 218 10*3/uL (ref 150–400)
RBC: 3.41 MIL/uL — ABNORMAL LOW (ref 3.87–5.11)
RDW: 13.3 % (ref 11.5–15.5)
WBC Count: 7 10*3/uL (ref 4.0–10.5)
nRBC: 0 % (ref 0.0–0.2)

## 2022-01-01 MED ORDER — SODIUM CHLORIDE 0.9% FLUSH
10.0000 mL | INTRAVENOUS | Status: DC | PRN
  Administered 2022-01-01: 10 mL

## 2022-01-01 MED ORDER — SODIUM CHLORIDE 0.9 % IV SOLN: Freq: Once | INTRAVENOUS | Status: AC

## 2022-01-01 MED ORDER — HEPARIN SOD (PORK) LOCK FLUSH 100 UNIT/ML IV SOLN
500.0000 [IU] | Freq: Once | INTRAVENOUS | Status: AC | PRN
  Administered 2022-01-01: 500 [IU]

## 2022-01-01 MED ORDER — ACETAMINOPHEN 325 MG PO TABS
650.0000 mg | ORAL_TABLET | Freq: Once | ORAL | Status: AC
  Administered 2022-01-01: 650 mg via ORAL
  Filled 2022-01-01: qty 2

## 2022-01-01 MED ORDER — SODIUM CHLORIDE 0.9 % IV SOLN
375.0000 mg/m2 | Freq: Once | INTRAVENOUS | Status: AC
  Administered 2022-01-01: 700 mg via INTRAVENOUS
  Filled 2022-01-01: qty 50

## 2022-01-01 MED ORDER — DIPHENHYDRAMINE HCL 25 MG PO CAPS
50.0000 mg | ORAL_CAPSULE | Freq: Once | ORAL | Status: AC
  Administered 2022-01-01: 50 mg via ORAL
  Filled 2022-01-01: qty 2

## 2022-01-01 NOTE — Progress Notes (Signed)
[facility] [place], [place] [address] [phone]   Interval Evaluation  Date of Service: 01/01/22 Patient Name: Daisy Oliver Patient MRN: [id number] Patient DOB: 02/05/1936 Provider: [name], MD  Identifying Statement:  Daisy Oliver is a 86 y.o. female with right frontal  CNS lymphoma    Oncologic History: Oncology History  Primary CNS lymphoma ([place])  09/02/2021 Surgery   Stereotactic biopsy by Dr. Marcello Moores; path is B-cell lymphoma CD20+   09/30/2021 - 12/05/2021 Chemotherapy   Patient is on Treatment Plan : IP NON-HODGKINS LYMPHOMA High Dose Methotrexate + Leucovorin Rescue     10/02/2021 -  Chemotherapy   Patient is on Treatment Plan : NON-HODGKINS LYMPHOMA Rituximab q21d     01/01/2022 -  Chemotherapy   Patient is on Treatment Plan : BRAIN GLIOBLASTOMA Consolidation Temozolomide Days 1-5 q28 Days        Biomarkers:  CD20 positive .  Ki-67 50% .   Unknown   Unknown   Interval History: Daisy Oliver presents today for rituximab infusion.  Leg swelling is now improved.  Intermittent GI distress, diarrhea continue.  Currently independent with gait and functional status.     H+P (09/16/21) Patient presented to medical attention in May 2023 with several weeks of progressive gait instability.  She describes feeling unsteady on her feet, even falling "a couple of times".  After one of the falls, ED visit led to CNS imaging which demonstrated multifocal masses in the right hemisphere.  She underwent stereotactic biopsy with Dr. Marcello Moores on 09/02/21; path demonstrated diffuse large B-cell lymphoma, CD20+.  Since surgery, she has tapered off the steroids without issue.  She feels "fairly normal" aside from pain in her knee and lower back.  She uses a cane to walk, mainly because of the knee pain.  No issues with frank weakness, no seizures.  She lives at home with her husband who has dementia.  Daughter is  nearby in [place].  Medications: Current Outpatient Medications on File Prior to Visit  Medication Sig Dispense Refill   acetaminophen (TYLENOL) 500 MG tablet Take 500 mg by mouth 2 (two) times daily as needed (pain).     Ascorbic Acid (VITAMIN C PO) Take 1 tablet by mouth daily.     BIOTIN PO Take 1 tablet by mouth 2 (two) times daily.     famotidine (PEPCID) 20 MG tablet Take 20 mg by mouth at bedtime as needed for indigestion.     liothyronine (CYTOMEL) 5 MCG tablet Take 2 tablets (10 mcg total) by mouth See admin instructions. Take 10 mcg before breakfast. (Patient taking differently: Take 5 mcg by mouth in the morning and at bedtime.) 180 tablet 3   MAGNESIUM PO Take 1 capsule by mouth 2 (two) times daily.     Melatonin 10 MG TABS Take 10 mg by mouth at bedtime.     Menthol, Topical Analgesic, (BIOFREEZE EX) Apply 1 Application topically at bedtime as needed (leg pain).     Menthol, Topical Analgesic, (BIOFREEZE) 5 % PTCH Apply 1 patch topically as needed (back pain).     Misc Natural Products (OSTEO BI-FLEX JOINT SHIELD PO) Take 1 tablet by mouth 2 (two) times daily.     Multiple Vitamin (MULTIVITAMIN WITH MINERALS) TABS tablet Take 1 tablet by mouth 2 (two) times daily.     Multiple Vitamins-Minerals (ZINC PO) Take 1 tablet by mouth 2 (two) times daily.     nystatin  cream (MYCOSTATIN) Apply 1 Application topically as needed for dry skin (fever blisters).     olmesartan (BENICAR) 20 MG tablet Take 1/2 tablet  Daily  for BP (Patient taking differently: Take 10 mg by mouth in the morning and at bedtime.) 45 tablet 3   ondansetron (ZOFRAN) 8 MG tablet Take 1 tablet (8 mg total) by mouth every 8 (eight) hours as needed for nausea or vomiting. May take 30-60 minutes prior to Temodar administration if nausea/vomiting occurs as needed. 30 tablet 1   Polyethyl Glycol-Propyl Glycol (SYSTANE) 0.4-0.3 % GEL ophthalmic gel Place 1 Application into both eyes at bedtime.     TAURINE PO Take 1 capsule  by mouth daily.     temozolomide (TEMODAR) 140 MG capsule Take 2 capsules (280 mg total) by mouth daily. Take for 5 days, then off 23 days. May take on an empty stomach to decrease nausea & vomiting. 10 capsule 0   tretinoin (RETIN-A) 0.05 % cream Apply topically at bedtime. (Patient taking differently: Apply 1 application  topically See admin instructions. 1 application at bedtime on Monday, Wednesday, Friday) 45 g 3   No current facility-administered medications on file prior to visit.    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 Herbert Deaner   FRAMELESS  BIOPSY WITH BRAINLAB Right 09/02/2021   Procedure: FRONTAL STEREOTACTIC BRAIN BIOPSY;  Surgeon: Vallarie Mare, MD;  Location: [facility];  Service: Neurosurgery;  Laterality: Right;   IR IMAGING GUIDED PORT INSERTION  09/24/2021   RADIAL HEAD ARTHROPLASTY Right 09/16/2012   Procedure: RADIAL HEAD ARTHROPLASTY;  Surgeon: Johnny Bridge, MD;  Location: [facility];  Service: Orthopedics;  Laterality: Right;  RADIAL  HEAD REPLACEMENT     Social History:  Social History   Socioeconomic History   Marital status: Married    Spouse name: Not on file   Number of children: 2   Years of education: Not on file   Highest education level: Not on file  Occupational History   Not on file  Tobacco Use   Smoking status: Former    Packs/day: 2.00    Years: 22.00    Total pack years: 44.00    Types: Cigarettes    Quit date: 09/17/1983    Years since quitting: 38.3   Smokeless tobacco: Never  Vaping Use   Vaping Use: Never used  Substance and Sexual Activity   Alcohol use: Not Currently    Alcohol/week: 0.0 - 1.0 standard drinks of  alcohol    Comment: 2/month   Drug use: No   Sexual activity: Not Currently  Other Topics Concern   Not on file  Social History Narrative   Not on file   Social Determinants of Health   Financial Resource Strain: Not on file  Food Insecurity: Not on file  Transportation Needs: Not on file  Physical Activity: Sufficiently Active (09/14/2017)   Exercise Vital Sign    Days of Exercise per Week: 5 days    Minutes of Exercise per Session: 60 min  Stress: No Stress Concern Present (09/14/2017)   Hope    Feeling of Stress : Only a little  Social Connections: Not on file  Intimate Partner Violence: Not on file   Family History:  Family History  Problem Relation Age of Onset   Heart disease Mother    Brain cancer Father 58       Brain tumor, not cancerous   Hypertension Sister    Osteoporosis Sister    Arthritis Sister    Scoliosis Sister    Hypertension Sister    Osteoporosis Sister    Cancer Paternal Grandfather    Suicidality Paternal Grandfather    Breast cancer Neg Hx     Review of Systems: Constitutional: Doesn't report fevers, chills or abnormal weight loss Eyes: Doesn't report blurriness of vision Ears, nose, mouth, throat, and face: Doesn't report sore throat Respiratory: Doesn't  report cough, dyspnea or wheezes Cardiovascular: Doesn't report palpitation, chest discomfort  Gastrointestinal:  Doesn't report nausea, constipation, diarrhea GU: Doesn't report incontinence Skin: Doesn't report skin rashes Neurological: Per HPI Musculoskeletal: Doesn't report joint pain Behavioral/Psych: Doesn't report anxiety  Physical Exam: Vitals:   01/01/22 0925  BP: 114/60  Pulse: 69  Resp: 14  Temp: 97.7 F (36.5 C)  SpO2: 100%    KPS: 80. General: Alert, cooperative, pleasant, in no acute distress Head: Normal EENT: No conjunctival injection or scleral icterus.  Lungs: Resp effort normal Cardiac: Regular rate Abdomen: Non-distended abdomen Skin: No rashes cyanosis or petechiae. Extremities: No clubbing or edema  Neurologic Exam: Mental Status: Awake, alert, attentive to examiner. Oriented to self and environment. Language is fluent with intact comprehension.  Cranial Nerves: Visual acuity is grossly normal. Visual fields are full. Extra-ocular movements intact. No ptosis. Face is symmetric Motor: Tone and bulk are normal. Power is full in both arms and legs. Reflexes are symmetric, no pathologic reflexes present.  Sensory: Intact to light touch Gait: Orthopedic limitation, cane assisted   Labs: I have reviewed the data as listed    Component Value Date/Time   NA 139 01/01/2022 0852   K 3.4 (L) 01/01/2022 0852   CL 104 01/01/2022 0852   CO2 30 01/01/2022 0852   GLUCOSE 155 (H) 01/01/2022 0852   BUN 15 01/01/2022 0852   CREATININE 1.07 (H) 01/01/2022 0852   CREATININE 0.85 11/05/2021 1619   CALCIUM 10.2 01/01/2022 0852   PROT 6.6 01/01/2022 0852   ALBUMIN 3.6 01/01/2022 0852   AST 25 01/01/2022 0852   ALT 15 01/01/2022 0852   ALKPHOS 53 01/01/2022 0852   BILITOT 0.6 01/01/2022 0852   GFRNONAA 51 (L) 01/01/2022 0852   GFRNONAA 66 09/19/2020 0000   GFRAA 76 09/19/2020 0000   Lab Results  Component Value Date   WBC 7.0 01/01/2022   NEUTROABS 4.0  01/01/2022   HGB 11.0 (L) 01/01/2022   HCT 33.2 (L) 01/01/2022   MCV 97.4 01/01/2022  PLT 218 01/01/2022    Imaging:  Gate Clinician Interpretation: I have personally reviewed the CNS images as listed.  My interpretation, in the context of the patient's clinical presentation, is stable disease  MR BRAIN W WO CONTRAST  Result Date: 12/21/2021 CLINICAL DATA:  86 year old female with CNS Lymphoma, Diffuse large B-cell type on stereotactic frontal brain biopsy in May. Restaging. EXAM: MRI HEAD WITHOUT AND WITH CONTRAST TECHNIQUE: Multiplanar, multiecho pulse sequences of the brain and surrounding structures were obtained without and with intravenous contrast. CONTRAST:  33m GADAVIST GADOBUTROL 1 MMOL/ML IV SOLN COMPARISON:  Pre biopsy MRI 08/28/2021 and earlier. FINDINGS: Brain: Resolved intracranial mass effect and midline shift. Slight ex vacuo appearing enlargement of the right lateral ventricle now in the area of previous infiltrative tumor (series 11, image 30). Regressed multifocal abnormal enhancement in the right frontal lobe, with residual in the anterior superior frontal gyrus (16 mm series 16, image 106), right anterior corona radiata, right anterior insula and operculum (discontinuous now in an area of about 2 cm series 17, image 18), and a small 4-5 mm nodular area in the posterior right lentiform series 16, image 76. Corresponding regressed abnormal T2 and FLAIR signal in these areas with mild to moderate patchy residual. And there is new hemosiderin in the areas of residual enhancing treated tumor. Mild residual diffusion abnormality in those areas, which might in part be susceptibility related. No new areas of abnormal enhancement. No new or increased hemispheric T2 or FLAIR hyperintensity. However, there is patchy increased T2 and FLAIR hyperintensity in the pons (series 11, image 16). But there is no associated mass effect, enhancement, or hemosiderin. Diffusion appears facilitated. No  restricted diffusion suggestive of acute infarction. No ventriculomegaly, extra-axial collection or acute intracranial hemorrhage. Cervicomedullary junction and pituitary are within normal limits. No dural thickening. Vascular: Major intracranial vascular flow voids are stable. The major dural venous sinuses are enhancing and appear to be patent. 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 Adjacent segment cervical spine degeneration. Visualized bone marrow signal is within normal limits. Stable visible spinal cord. Sinuses/Orbits: Stable and negative. Other: Mild left mastoid effusion appears stable. Negative visible nasopharynx. Visible internal auditory structures appear normal. Negative visible scalp and face. IMPRESSION: 1. Regression of CNS Lymphoma since May. Resolved intracranial mass effect. Patchy residual enhancing tumor in the right frontal lobe. 2. Nonspecific new T2/FLAIR heterogeneity in the pons, but no other signal changes or mass effect there to strongly suggest treated or active tumor. Electronically Signed   By: HGenevie AnnM.D.   On: 12/21/2021 11:14      Assessment/Plan Primary CNS lymphoma ([place]  LRemonia RichterWTilleris clinically stable today.  She is cleared for rituximab infusion.  We recommended initiating treatment with cycle #1 Temozolomide 150 mg/m2, on for five days and off for twenty three days in twenty eight day cycles. The patient will have a complete blood count performed on days 21 and 28 of each cycle, and a comprehensive metabolic panel performed on day 28 of each cycle. Labs may need to be performed more often. Zofran will prescribed for home use for nausea/vomiting.   Informed consent was obtained verbally at bedside to proceed with oral chemotherapy.  Chemotherapy should be held for the following:  ANC less than 1,000  Platelets less than 100,000  LFT or creatinine greater than 2x ULN  If clinical  concerns/contraindications develop  We ask that LJosephina Gipreturn to clinic in 1  month with labs for evaluation prior to cycle #2 TMZ, rituximab.  All questions were answered. The patient knows to call the clinic with any problems, questions or concerns. No barriers to learning were detected.  The total time spent in the encounter was 30 minutes and more than 50% was on counseling and review of test results   [name], MD Medical Director of Neuro-Oncology [facility] at [place] 01/01/22 9:20 AM

## 2022-01-01 NOTE — Patient Instructions (Signed)
[facility]  Discharge Instructions: Thank you for choosing [facility] to provide your oncology and hematology care.   If you have a lab appointment with the [facility], please go directly to the [facility] and check in at the registration area.   Wear comfortable clothing and clothing appropriate for easy access to any Portacath or PICC line.   We strive to give you quality time with your provider. You may need to reschedule your appointment if you arrive late (15 or more minutes).  Arriving late affects you and other patients whose appointments are after yours.  Also, if you miss three or more appointments without notifying the office, you may be dismissed from the clinic at the provider's discretion.      For prescription refill requests, have your pharmacy contact our office and allow 72 hours for refills to be completed.    Today you received the following immunotherapy agents   Rituximab To help prevent nausea and vomiting after your treatment, we encourage you to take your nausea medication as directed.  BELOW ARE SYMPTOMS THAT SHOULD BE REPORTED IMMEDIATELY: *FEVER GREATER THAN 100.4 F (38 C) OR HIGHER *CHILLS OR SWEATING *NAUSEA AND VOMITING THAT IS NOT CONTROLLED WITH YOUR NAUSEA MEDICATION *UNUSUAL SHORTNESS OF BREATH *UNUSUAL BRUISING OR BLEEDING *URINARY PROBLEMS (pain or burning when urinating, or frequent urination) *BOWEL PROBLEMS (unusual diarrhea, constipation, pain near the anus) TENDERNESS IN MOUTH AND THROAT WITH OR WITHOUT PRESENCE OF ULCERS (sore throat, sores in mouth, or a toothache) UNUSUAL RASH, SWELLING OR PAIN  UNUSUAL VAGINAL DISCHARGE OR ITCHING   Items with * indicate a potential emergency and should be followed up as soon as possible or go to the Emergency Department if any problems should occur.  Please show the CHEMOTHERAPY ALERT CARD or IMMUNOTHERAPY ALERT CARD at check-in to the Emergency Department  and triage nurse.  Should you have questions after your visit or need to cancel or reschedule your appointment, please contact [facility]  Dept: [phone]  and follow the prompts.  Office hours are 8:00 a.m. to 4:30 p.m. Monday - Friday. Please note that voicemails left after 4:00 p.m. may not be returned until the following business day.  We are closed weekends and major holidays. You have access to a nurse at all times for urgent questions. Please call the main number to the clinic Dept: [phone] and follow the prompts.   For any non-urgent questions, you may also contact your provider using MyChart. We now offer e-Visits for anyone 25 and older to request care online for non-urgent symptoms. For details visit [email or web address].   Also download the MyChart app! Go to the app store, search "MyChart", open the app, select [facility], and log in with your MyChart username and password.  Masks are optional in the cancer centers. If you would like for your care team to wear a mask while they are taking care of you, please let them know. You may have one support person who is at least 84 years old accompany you for your appointments. Rituximab Injection What is this medication? RITUXIMAB (ri TUX i mab) treats leukemia and lymphoma. It works by blocking a protein that causes cancer cells to grow and multiply. This helps to slow or stop the spread of cancer cells. It may also be used to treat autoimmune conditions, such as arthritis. It works by slowing down an overactive immune system. It is a monoclonal antibody. This  medicine may be used for other purposes; ask your health care provider or pharmacist if you have questions. COMMON BRAND NAME(S): RIABNI, Rituxan, RUXIENCE, truxima What should I tell my care team before I take this medication? They need to know if you have any of these conditions: Chest pain Heart disease Immune system problems Infection,  such as chickenpox, cold sores, hepatitis B, herpes Irregular heartbeat or rhythm Kidney disease Low blood counts, such as low white cells, platelets, red cells Lung disease Recent or upcoming vaccine An unusual or allergic reaction to rituximab, other medications, foods, dyes, or preservatives Pregnant or trying to get pregnant Breast-feeding How should I use this medication? This medication is injected into a vein. It is given by a care team in a hospital or clinic setting. A special MedGuide will be given to you before each treatment. Be sure to read this information carefully each time. Talk to your care team about the use of this medication in children. While this medication may be prescribed for children as young as 6 months for selected conditions, precautions do apply. Overdosage: If you think you have taken too much of this medicine contact a poison control center or emergency room at once. NOTE: This medicine is only for you. Do not share this medicine with others. What if I miss a dose? Keep appointments for follow-up doses. It is important not to miss your dose. Call your care team if you are unable to keep an appointment. What may interact with this medication? Do not take this medication with any of the following: Live vaccines This medication may also interact with the following: Cisplatin This list may not describe all possible interactions. Give your health care provider a list of all the medicines, herbs, non-prescription drugs, or dietary supplements you use. Also tell them if you smoke, drink alcohol, or use illegal drugs. Some items may interact with your medicine. What should I watch for while using this medication? Your condition will be monitored carefully while you are receiving this medication. You may need blood work while taking this medication. This medication can cause serious infusion reactions. To reduce the risk your care team may give you other medications  to take before receiving this one. Be sure to follow the directions from your care team. This medication may increase your risk of getting an infection. Call your care team for advice if you get a fever, chills, sore throat, or other symptoms of a cold or flu. Do not treat yourself. Try to avoid being around people who are sick. Call your care team if you are around anyone with measles, chickenpox, or if you develop sores or blisters that do not heal properly. Avoid taking medications that contain aspirin, acetaminophen, ibuprofen, naproxen, or ketoprofen unless instructed by your care team. These medications may hide a fever. This medication may cause serious skin reactions. They can happen weeks to months after starting the medication. Contact your care team right away if you notice fevers or flu-like symptoms with a rash. The rash may be red or purple and then turn into blisters or peeling of the skin. You may also notice a red rash with swelling of the face, lips, or lymph nodes in your neck or under your arms. In some patients, this medication may cause a serious brain infection that may cause death. If you have any problems seeing, thinking, speaking, walking, or standing, tell your care team right away. If you cannot reach your care team, urgently seek another source  of medical care. Talk to your care team if you may be pregnant. Serious birth defects can occur if you take this medication during pregnancy and for 12 months after the last dose. You will need a negative pregnancy test before starting this medication. Contraception is recommended while taking this medication and for 12 months after the last dose. Your care team can help you find the option that works for you. Do not breastfeed while taking this medication and for at least 6 months after the last dose. What side effects may I notice from receiving this medication? Side effects that you should report to your care team as soon as  possible: Allergic reactions or angioedema--skin rash, itching or hives, swelling of the face, eyes, lips, tongue, arms, or legs, trouble swallowing or breathing Bowel blockage--stomach cramping, unable to have a bowel movement or pass gas, loss of appetite, vomiting Dizziness, loss of balance or coordination, confusion or trouble speaking Heart attack--pain or tightness in the chest, shoulders, arms, or jaw, nausea, shortness of breath, cold or clammy skin, feeling faint or lightheaded Heart rhythm changes--fast or irregular heartbeat, dizziness, feeling faint or lightheaded, chest pain, trouble breathing Infection--fever, chills, cough, sore throat, wounds that don't heal, pain or trouble when passing urine, general feeling of discomfort or being unwell Infusion reactions--chest pain, shortness of breath or trouble breathing, feeling faint or lightheaded Kidney injury--decrease in the amount of urine, swelling of the ankles, hands, or feet Liver injury--right upper belly pain, loss of appetite, nausea, light-colored stool, dark yellow or brown urine, yellowing skin or eyes, unusual weakness or fatigue Redness, blistering, peeling, or loosening of the skin, including inside the mouth Stomach pain that is severe, does not go away, or gets worse Tumor lysis syndrome (TLS)--nausea, vomiting, diarrhea, decrease in the amount of urine, dark urine, unusual weakness or fatigue, confusion, muscle pain or cramps, fast or irregular heartbeat, joint pain Side effects that usually do not require medical attention (report to your care team if they continue or are bothersome): Headache Joint pain Nausea Runny or stuffy nose Unusual weakness or fatigue This list may not describe all possible side effects. Call your doctor for medical advice about side effects. You may report side effects to FDA at 1-800-FDA-1088. Where should I keep my medication? This medication is given in a hospital or clinic. It will not  be stored at home. NOTE: This sheet is a summary. It may not cover all possible information. If you have questions about this medicine, talk to your doctor, pharmacist, or health care provider.  2023 Elsevier/Gold Standard (2021-08-18 00:00:00)

## 2022-01-03 ENCOUNTER — Encounter: Payer: Self-pay | Admitting: Internal Medicine

## 2022-01-04 ENCOUNTER — Encounter: Payer: Self-pay | Admitting: Internal Medicine

## 2022-01-09 ENCOUNTER — Encounter: Payer: Self-pay | Admitting: Internal Medicine

## 2022-01-10 DIAGNOSIS — I4819 Other persistent atrial fibrillation: Secondary | ICD-10-CM | POA: Diagnosis not present

## 2022-01-10 DIAGNOSIS — J449 Chronic obstructive pulmonary disease, unspecified: Secondary | ICD-10-CM | POA: Diagnosis not present

## 2022-01-13 ENCOUNTER — Other Ambulatory Visit (HOSPITAL_COMMUNITY): Payer: Self-pay

## 2022-01-15 DIAGNOSIS — M25561 Pain in right knee: Secondary | ICD-10-CM | POA: Diagnosis not present

## 2022-01-15 DIAGNOSIS — M545 Low back pain, unspecified: Secondary | ICD-10-CM | POA: Diagnosis not present

## 2022-01-17 ENCOUNTER — Encounter: Payer: Self-pay | Admitting: Nurse Practitioner

## 2022-01-19 ENCOUNTER — Emergency Department (HOSPITAL_COMMUNITY): Payer: Medicare PPO

## 2022-01-19 ENCOUNTER — Encounter (HOSPITAL_COMMUNITY): Payer: Self-pay | Admitting: Emergency Medicine

## 2022-01-19 ENCOUNTER — Encounter: Payer: Self-pay | Admitting: Internal Medicine

## 2022-01-19 ENCOUNTER — Other Ambulatory Visit: Payer: Self-pay

## 2022-01-19 ENCOUNTER — Inpatient Hospital Stay (HOSPITAL_COMMUNITY)
Admission: EM | Admit: 2022-01-19 | Discharge: 2022-01-22 | DRG: 081 | Disposition: A | Discharge status: Home / Self Care | Payer: Medicare PPO | Attending: Internal Medicine | Admitting: Internal Medicine

## 2022-01-19 DIAGNOSIS — Z808 Family history of malignant neoplasm of other organs or systems: Secondary | ICD-10-CM

## 2022-01-19 DIAGNOSIS — E785 Hyperlipidemia, unspecified: Secondary | ICD-10-CM | POA: Diagnosis present

## 2022-01-19 DIAGNOSIS — Z79890 Hormone replacement therapy (postmenopausal): Secondary | ICD-10-CM

## 2022-01-19 DIAGNOSIS — C8589 Other specified types of non-Hodgkin lymphoma, extranodal and solid organ sites: Secondary | ICD-10-CM | POA: Diagnosis present

## 2022-01-19 DIAGNOSIS — M810 Age-related osteoporosis without current pathological fracture: Secondary | ICD-10-CM | POA: Diagnosis present

## 2022-01-19 DIAGNOSIS — Z9841 Cataract extraction status, right eye: Secondary | ICD-10-CM | POA: Diagnosis not present

## 2022-01-19 DIAGNOSIS — Z8616 Personal history of COVID-19: Secondary | ICD-10-CM

## 2022-01-19 DIAGNOSIS — Z8262 Family history of osteoporosis: Secondary | ICD-10-CM

## 2022-01-19 DIAGNOSIS — C8339 Primary central nervous system lymphoma: Secondary | ICD-10-CM | POA: Diagnosis present

## 2022-01-19 DIAGNOSIS — Z8249 Family history of ischemic heart disease and other diseases of the circulatory system: Secondary | ICD-10-CM

## 2022-01-19 DIAGNOSIS — E039 Hypothyroidism, unspecified: Secondary | ICD-10-CM | POA: Diagnosis present

## 2022-01-19 DIAGNOSIS — D649 Anemia, unspecified: Secondary | ICD-10-CM

## 2022-01-19 DIAGNOSIS — R531 Weakness: Secondary | ICD-10-CM | POA: Diagnosis present

## 2022-01-19 DIAGNOSIS — Z8572 Personal history of non-Hodgkin lymphomas: Secondary | ICD-10-CM | POA: Diagnosis not present

## 2022-01-19 DIAGNOSIS — I129 Hypertensive chronic kidney disease with stage 1 through stage 4 chronic kidney disease, or unspecified chronic kidney disease: Secondary | ICD-10-CM | POA: Diagnosis present

## 2022-01-19 DIAGNOSIS — Z87891 Personal history of nicotine dependence: Secondary | ICD-10-CM | POA: Diagnosis not present

## 2022-01-19 DIAGNOSIS — Z8261 Family history of arthritis: Secondary | ICD-10-CM | POA: Diagnosis not present

## 2022-01-19 DIAGNOSIS — D72829 Elevated white blood cell count, unspecified: Secondary | ICD-10-CM | POA: Diagnosis present

## 2022-01-19 DIAGNOSIS — N182 Chronic kidney disease, stage 2 (mild): Secondary | ICD-10-CM | POA: Diagnosis present

## 2022-01-19 DIAGNOSIS — R220 Localized swelling, mass and lump, head: Secondary | ICD-10-CM | POA: Diagnosis not present

## 2022-01-19 DIAGNOSIS — C8599 Non-Hodgkin lymphoma, unspecified, extranodal and solid organ sites: Secondary | ICD-10-CM | POA: Diagnosis present

## 2022-01-19 DIAGNOSIS — Z9842 Cataract extraction status, left eye: Secondary | ICD-10-CM | POA: Diagnosis not present

## 2022-01-19 DIAGNOSIS — I10 Essential (primary) hypertension: Secondary | ICD-10-CM | POA: Diagnosis not present

## 2022-01-19 DIAGNOSIS — R42 Dizziness and giddiness: Secondary | ICD-10-CM | POA: Diagnosis not present

## 2022-01-19 DIAGNOSIS — Z888 Allergy status to other drugs, medicaments and biological substances status: Secondary | ICD-10-CM | POA: Diagnosis not present

## 2022-01-19 DIAGNOSIS — G936 Cerebral edema: Secondary | ICD-10-CM | POA: Diagnosis present

## 2022-01-19 DIAGNOSIS — D630 Anemia in neoplastic disease: Secondary | ICD-10-CM | POA: Diagnosis present

## 2022-01-19 DIAGNOSIS — I700 Atherosclerosis of aorta: Secondary | ICD-10-CM | POA: Diagnosis present

## 2022-01-19 DIAGNOSIS — R112 Nausea with vomiting, unspecified: Secondary | ICD-10-CM | POA: Diagnosis not present

## 2022-01-19 DIAGNOSIS — R410 Disorientation, unspecified: Secondary | ICD-10-CM | POA: Diagnosis not present

## 2022-01-19 DIAGNOSIS — Z818 Family history of other mental and behavioral disorders: Secondary | ICD-10-CM | POA: Diagnosis not present

## 2022-01-19 DIAGNOSIS — Z66 Do not resuscitate: Secondary | ICD-10-CM | POA: Diagnosis not present

## 2022-01-19 DIAGNOSIS — Z881 Allergy status to other antibiotic agents status: Secondary | ICD-10-CM

## 2022-01-19 DIAGNOSIS — K219 Gastro-esophageal reflux disease without esophagitis: Secondary | ICD-10-CM | POA: Diagnosis present

## 2022-01-19 DIAGNOSIS — Z79899 Other long term (current) drug therapy: Secondary | ICD-10-CM

## 2022-01-19 DIAGNOSIS — Z85828 Personal history of other malignant neoplasm of skin: Secondary | ICD-10-CM

## 2022-01-19 DIAGNOSIS — G9389 Other specified disorders of brain: Secondary | ICD-10-CM | POA: Diagnosis not present

## 2022-01-19 DIAGNOSIS — Z809 Family history of malignant neoplasm, unspecified: Secondary | ICD-10-CM | POA: Diagnosis not present

## 2022-01-19 DIAGNOSIS — R000 Tachycardia, unspecified: Secondary | ICD-10-CM | POA: Diagnosis not present

## 2022-01-19 HISTORY — DX: Malignant (primary) neoplasm, unspecified: C80.1

## 2022-01-19 HISTORY — DX: Primary central nervous system lymphoma: C83.390

## 2022-01-19 HISTORY — DX: Other specified types of non-hodgkin lymphoma, extranodal and solid organ sites: C85.89

## 2022-01-19 LAB — BASIC METABOLIC PANEL
Anion gap: 10 (ref 5–15)
BUN: 19 mg/dL (ref 8–23)
CO2: 26 mmol/L (ref 22–32)
Calcium: 9.6 mg/dL (ref 8.9–10.3)
Chloride: 102 mmol/L (ref 98–111)
Creatinine, Ser: 1.12 mg/dL — ABNORMAL HIGH (ref 0.44–1.00)
GFR, Estimated: 48 mL/min — ABNORMAL LOW (ref >60)
Glucose, Bld: 115 mg/dL — ABNORMAL HIGH (ref 70–99)
Potassium: 3.9 mmol/L (ref 3.5–5.1)
Sodium: 138 mmol/L (ref 135–145)

## 2022-01-19 LAB — CBC
HCT: 37.2 % (ref 36.0–46.0)
Hemoglobin: 12 g/dL (ref 12.0–15.0)
MCH: 32.3 pg (ref 26.0–34.0)
MCHC: 32.3 g/dL (ref 30.0–36.0)
MCV: 100 fL (ref 80.0–100.0)
Platelets: 249 10*3/uL (ref 150–400)
RBC: 3.72 MIL/uL — ABNORMAL LOW (ref 3.87–5.11)
RDW: 13.7 % (ref 11.5–15.5)
WBC: 11.3 10*3/uL — ABNORMAL HIGH (ref 4.0–10.5)
nRBC: 0 % (ref 0.0–0.2)

## 2022-01-19 LAB — CBG MONITORING, ED: Glucose-Capillary: 109 mg/dL — ABNORMAL HIGH (ref 70–99)

## 2022-01-19 MED ORDER — SODIUM CHLORIDE 0.9 % IV BOLUS
1000.0000 mL | Freq: Once | INTRAVENOUS | Status: AC
  Administered 2022-01-19: 1000 mL via INTRAVENOUS

## 2022-01-19 NOTE — ED Triage Notes (Signed)
Patient is from home. EMS was called out to the home because the patient slid off the bed and was too weak to get up. She has felt dizzy and nauseous all day.    HX: left sided weakness, brain cancer   EMS vitals: 74 HR 139 CBG 130/72 laying BP 114/70 standing BP 96% SPO2 on room air

## 2022-01-20 ENCOUNTER — Other Ambulatory Visit (HOSPITAL_COMMUNITY): Payer: Self-pay

## 2022-01-20 ENCOUNTER — Inpatient Hospital Stay (HOSPITAL_COMMUNITY): Payer: Medicare PPO

## 2022-01-20 ENCOUNTER — Other Ambulatory Visit: Payer: Self-pay

## 2022-01-20 ENCOUNTER — Encounter (HOSPITAL_COMMUNITY): Payer: Self-pay | Admitting: Internal Medicine

## 2022-01-20 DIAGNOSIS — K219 Gastro-esophageal reflux disease without esophagitis: Secondary | ICD-10-CM | POA: Diagnosis present

## 2022-01-20 DIAGNOSIS — Z8249 Family history of ischemic heart disease and other diseases of the circulatory system: Secondary | ICD-10-CM | POA: Diagnosis not present

## 2022-01-20 DIAGNOSIS — Z8261 Family history of arthritis: Secondary | ICD-10-CM | POA: Diagnosis not present

## 2022-01-20 DIAGNOSIS — C8599 Non-Hodgkin lymphoma, unspecified, extranodal and solid organ sites: Secondary | ICD-10-CM | POA: Diagnosis present

## 2022-01-20 DIAGNOSIS — D72829 Elevated white blood cell count, unspecified: Secondary | ICD-10-CM

## 2022-01-20 DIAGNOSIS — Z87891 Personal history of nicotine dependence: Secondary | ICD-10-CM | POA: Diagnosis not present

## 2022-01-20 DIAGNOSIS — Z8262 Family history of osteoporosis: Secondary | ICD-10-CM | POA: Diagnosis not present

## 2022-01-20 DIAGNOSIS — Z66 Do not resuscitate: Secondary | ICD-10-CM | POA: Diagnosis not present

## 2022-01-20 DIAGNOSIS — E039 Hypothyroidism, unspecified: Secondary | ICD-10-CM | POA: Diagnosis not present

## 2022-01-20 DIAGNOSIS — Z808 Family history of malignant neoplasm of other organs or systems: Secondary | ICD-10-CM | POA: Diagnosis not present

## 2022-01-20 DIAGNOSIS — G936 Cerebral edema: Secondary | ICD-10-CM | POA: Diagnosis present

## 2022-01-20 DIAGNOSIS — R531 Weakness: Secondary | ICD-10-CM

## 2022-01-20 DIAGNOSIS — E785 Hyperlipidemia, unspecified: Secondary | ICD-10-CM | POA: Diagnosis present

## 2022-01-20 DIAGNOSIS — I700 Atherosclerosis of aorta: Secondary | ICD-10-CM | POA: Diagnosis present

## 2022-01-20 DIAGNOSIS — Z888 Allergy status to other drugs, medicaments and biological substances status: Secondary | ICD-10-CM | POA: Diagnosis not present

## 2022-01-20 DIAGNOSIS — Z818 Family history of other mental and behavioral disorders: Secondary | ICD-10-CM

## 2022-01-20 DIAGNOSIS — Z79899 Other long term (current) drug therapy: Secondary | ICD-10-CM | POA: Diagnosis not present

## 2022-01-20 DIAGNOSIS — I10 Essential (primary) hypertension: Secondary | ICD-10-CM | POA: Diagnosis not present

## 2022-01-20 DIAGNOSIS — C8589 Other specified types of non-Hodgkin lymphoma, extranodal and solid organ sites: Secondary | ICD-10-CM | POA: Diagnosis not present

## 2022-01-20 DIAGNOSIS — Z8616 Personal history of COVID-19: Secondary | ICD-10-CM | POA: Diagnosis not present

## 2022-01-20 DIAGNOSIS — R112 Nausea with vomiting, unspecified: Secondary | ICD-10-CM | POA: Diagnosis not present

## 2022-01-20 DIAGNOSIS — Z881 Allergy status to other antibiotic agents status: Secondary | ICD-10-CM | POA: Diagnosis not present

## 2022-01-20 DIAGNOSIS — Z9842 Cataract extraction status, left eye: Secondary | ICD-10-CM | POA: Diagnosis not present

## 2022-01-20 DIAGNOSIS — Z9841 Cataract extraction status, right eye: Secondary | ICD-10-CM | POA: Diagnosis not present

## 2022-01-20 DIAGNOSIS — M810 Age-related osteoporosis without current pathological fracture: Secondary | ICD-10-CM | POA: Diagnosis present

## 2022-01-20 DIAGNOSIS — Z809 Family history of malignant neoplasm, unspecified: Secondary | ICD-10-CM

## 2022-01-20 DIAGNOSIS — N182 Chronic kidney disease, stage 2 (mild): Secondary | ICD-10-CM | POA: Diagnosis present

## 2022-01-20 DIAGNOSIS — D630 Anemia in neoplastic disease: Secondary | ICD-10-CM | POA: Diagnosis present

## 2022-01-20 DIAGNOSIS — I129 Hypertensive chronic kidney disease with stage 1 through stage 4 chronic kidney disease, or unspecified chronic kidney disease: Secondary | ICD-10-CM | POA: Diagnosis present

## 2022-01-20 LAB — CBC WITH DIFFERENTIAL/PLATELET
Abs Immature Granulocytes: 0.03 10*3/uL (ref 0.00–0.07)
Basophils Absolute: 0 10*3/uL (ref 0.0–0.1)
Basophils Relative: 1 %
Eosinophils Absolute: 0.1 10*3/uL (ref 0.0–0.5)
Eosinophils Relative: 1 %
HCT: 33.9 % — ABNORMAL LOW (ref 36.0–46.0)
Hemoglobin: 11 g/dL — ABNORMAL LOW (ref 12.0–15.0)
Immature Granulocytes: 1 %
Lymphocytes Relative: 8 %
Lymphs Abs: 0.5 10*3/uL — ABNORMAL LOW (ref 0.7–4.0)
MCH: 32.3 pg (ref 26.0–34.0)
MCHC: 32.4 g/dL (ref 30.0–36.0)
MCV: 99.4 fL (ref 80.0–100.0)
Monocytes Absolute: 0.2 10*3/uL (ref 0.1–1.0)
Monocytes Relative: 3 %
Neutro Abs: 5.6 10*3/uL (ref 1.7–7.7)
Neutrophils Relative %: 86 %
Platelets: 239 10*3/uL (ref 150–400)
RBC: 3.41 MIL/uL — ABNORMAL LOW (ref 3.87–5.11)
RDW: 13.8 % (ref 11.5–15.5)
WBC: 6.4 10*3/uL (ref 4.0–10.5)
nRBC: 0 % (ref 0.0–0.2)

## 2022-01-20 LAB — COMPREHENSIVE METABOLIC PANEL
ALT: 19 U/L (ref 0–44)
AST: 29 U/L (ref 15–41)
Albumin: 3.4 g/dL — ABNORMAL LOW (ref 3.5–5.0)
Alkaline Phosphatase: 42 U/L (ref 38–126)
Anion gap: 7 (ref 5–15)
BUN: 17 mg/dL (ref 8–23)
CO2: 26 mmol/L (ref 22–32)
Calcium: 9.3 mg/dL (ref 8.9–10.3)
Chloride: 106 mmol/L (ref 98–111)
Creatinine, Ser: 0.9 mg/dL (ref 0.44–1.00)
GFR, Estimated: >60 mL/min (ref >60)
Glucose, Bld: 108 mg/dL — ABNORMAL HIGH (ref 70–99)
Potassium: 3.8 mmol/L (ref 3.5–5.1)
Sodium: 139 mmol/L (ref 135–145)
Total Bilirubin: 0.9 mg/dL (ref 0.3–1.2)
Total Protein: 6.6 g/dL (ref 6.5–8.1)

## 2022-01-20 LAB — HEPATIC FUNCTION PANEL
ALT: 21 U/L (ref 0–44)
AST: 38 U/L (ref 15–41)
Albumin: 4 g/dL (ref 3.5–5.0)
Alkaline Phosphatase: 46 U/L (ref 38–126)
Bilirubin, Direct: 0.2 mg/dL (ref 0.0–0.2)
Indirect Bilirubin: 0.7 mg/dL (ref 0.3–0.9)
Total Bilirubin: 0.9 mg/dL (ref 0.3–1.2)
Total Protein: 7.3 g/dL (ref 6.5–8.1)

## 2022-01-20 LAB — MAGNESIUM: Magnesium: 2.3 mg/dL (ref 1.7–2.4)

## 2022-01-20 LAB — URINALYSIS, ROUTINE W REFLEX MICROSCOPIC
Bilirubin Urine: NEGATIVE
Glucose, UA: NEGATIVE mg/dL
Hgb urine dipstick: NEGATIVE
Ketones, ur: 5 mg/dL — AB
Nitrite: NEGATIVE
Protein, ur: NEGATIVE mg/dL
Specific Gravity, Urine: 1.008 (ref 1.005–1.030)
pH: 7 (ref 5.0–8.0)

## 2022-01-20 LAB — TSH: TSH: 0.493 u[IU]/mL (ref 0.350–4.500)

## 2022-01-20 MED ORDER — ACETAMINOPHEN 650 MG RE SUPP: 650.0000 mg | Freq: Four times a day (QID) | RECTAL | Status: DC | PRN

## 2022-01-20 MED ORDER — GADOBUTROL 1 MMOL/ML IV SOLN
7.0000 mL | Freq: Once | INTRAVENOUS | Status: AC | PRN
  Administered 2022-01-20: 7 mL via INTRAVENOUS

## 2022-01-20 MED ORDER — DEXAMETHASONE SODIUM PHOSPHATE 4 MG/ML IJ SOLN
4.0000 mg | Freq: Four times a day (QID) | INTRAMUSCULAR | Status: DC
  Administered 2022-01-20 – 2022-01-22 (×11): 4 mg via INTRAVENOUS
  Filled 2022-01-20 (×11): qty 1

## 2022-01-20 MED ORDER — GADOPICLENOL 0.5 MMOL/ML IV SOLN: 7.0000 mL | Freq: Once | INTRAVENOUS | Status: DC | PRN

## 2022-01-20 MED ORDER — ACETAMINOPHEN 325 MG PO TABS
650.0000 mg | ORAL_TABLET | Freq: Four times a day (QID) | ORAL | Status: DC | PRN
  Administered 2022-01-21 (×2): 650 mg via ORAL
  Filled 2022-01-20 (×2): qty 2

## 2022-01-20 MED ORDER — ONDANSETRON HCL 4 MG/2ML IJ SOLN: 4.0000 mg | Freq: Four times a day (QID) | INTRAMUSCULAR | Status: DC | PRN

## 2022-01-20 MED ORDER — LIOTHYRONINE SODIUM 5 MCG PO TABS
5.0000 ug | ORAL_TABLET | Freq: Two times a day (BID) | ORAL | Status: DC
  Administered 2022-01-20 – 2022-01-22 (×5): 5 ug via ORAL
  Filled 2022-01-20 (×6): qty 1

## 2022-01-20 NOTE — Consult Note (Signed)
[facility] Consult Note  Patient Care Team: [name], MD as PCP - General (Internal Medicine) Izora Gala, MD as Consulting Physician (Otolaryngology) Michel Santee, MD as Consulting Physician (Neurology) [place] as Nurse Navigator [facility])  CHIEF COMPLAINTS/PURPOSE OF CONSULTATION:  Primary CNS Lymphoma  HISTORY OF PRESENTING ILLNESS:  Daisy Oliver 86 y.o. female presented with sudden onset change in sensation, possibly weakness of the right leg, as well as some dizziness.  This was accompanied by nausea, vomiting at home.  Over the evening and this morning, she describes a return to normal/baseline state.  She did receive steroids overnight, and underwent both CT and MRI studies.  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[place])    diagnosed at age 27   Cancer [facility])    CNS lymphoma ([place])    COVID-19 08/26/2020   Fracture of radial head, right, closed 09/16/2012   GERD (gastroesophageal reflux disease)    pepcid prn   Hypertension    Hypothyroidism    PONV (postoperative nausea and vomiting)     SURGICAL HISTORY: Past Surgical History:  Procedure Laterality Date   APPLICATION OF CRANIAL NAVIGATION Right 09/02/2021   Procedure: APPLICATION OF CRANIAL NAVIGATION;  Surgeon: Vallarie Mare, MD;  Location: [place];  Service: Neurosurgery;  Laterality: Right;   CATARACT EXTRACTION, BILATERAL  2020   Dr. Herbert Deaner   FRAMELESS  BIOPSY WITH BRAINLAB Right 09/02/2021   Procedure: FRONTAL STEREOTACTIC BRAIN BIOPSY;  Surgeon: Vallarie Mare, MD;  Location: [place];  Service: Neurosurgery;  Laterality: Right;   IR IMAGING GUIDED PORT INSERTION  09/24/2021   RADIAL HEAD ARTHROPLASTY Right 09/16/2012   Procedure: RADIAL HEAD ARTHROPLASTY;  Surgeon: Johnny Bridge, MD;  Location: [place];  Service: Orthopedics;  Laterality: Right;  RADIAL HEAD  REPLACEMENT      SOCIAL HISTORY: Social History   Socioeconomic History   Marital status: Married    Spouse name: Not on file   Number of children: 2   Years of education: Not on file   Highest education level: Not on file  Occupational History   Not on file  Tobacco Use   Smoking status: Former    Packs/day: 2.00    Years: 22.00    Total pack years: 44.00    Types: Cigarettes    Quit date: 09/17/1983    Years since quitting: 38.3   Smokeless tobacco: Never  Vaping Use   Vaping Use: Never used  Substance and Sexual Activity   Alcohol use: Not Currently    Alcohol/week: 0.0 - 1.0 standard drinks of alcohol    Comment: 2/month   Drug use: No   Sexual activity: Not Currently  Other Topics Concern   Not on file  Social History Narrative   Not on file   Social Determinants of Health   Financial Resource Strain: Not on file  Food Insecurity: Not on file  Transportation Needs: Not on file  Physical Activity: Sufficiently Active (09/14/2017)   Exercise Vital Sign    Days of Exercise per Week: 5 days    Minutes of Exercise per Session: 60 min  Stress: No Stress Concern Present (09/14/2017)   [place]    Feeling of Stress : Only a little  Social Connections: Not on file  Intimate Partner Violence: Not on file    FAMILY HISTORY: Family History  Problem Relation Age of Onset   Heart disease Mother    Brain cancer Father 4       Brain tumor, not cancerous   Hypertension Sister    Osteoporosis Sister    Arthritis Sister    Scoliosis Sister    Hypertension Sister    Osteoporosis Sister    Cancer Paternal Grandfather    Suicidality Paternal Grandfather    Breast cancer Neg Hx     ALLERGIES:  is allergic to barbiturates, other, pentothal [thiopental], and sulfa antibiotics.  MEDICATIONS:  Current Facility-Administered Medications  Medication Dose Route Frequency Provider Last Rate Last Admin    acetaminophen (TYLENOL) tablet 650 mg  650 mg Oral Q6H PRN Howerter, Justin B, DO       Or   acetaminophen (TYLENOL) suppository 650 mg  650 mg Rectal Q6H PRN Howerter, Justin B, DO       dexamethasone (DECADRON) injection 4 mg  4 mg Intravenous Q6H Cardama, Grayce Sessions, MD   4 mg at 01/20/22 1157   gadopiclenol (VUEWAY) 0.5 MMOL/ML solution 7 mL  7 mL Intravenous Once PRN Cardama, Grayce Sessions, MD       liothyronine (CYTOMEL) tablet 5 mcg  5 mcg Oral BID Howerter, Justin B, DO   5 mcg at 01/20/22 1046   ondansetron (ZOFRAN) injection 4 mg  4 mg Intravenous Q6H PRN Howerter, Justin B, DO       Current Outpatient Medications  Medication Sig Dispense Refill   acetaminophen (TYLENOL) 500 MG tablet Take 500 mg by mouth 2 (two) times daily as needed (pain).     Ascorbic Acid (VITAMIN C PO) Take 1 tablet by mouth daily.     BIOTIN PO Take 1 tablet by mouth 2 (two) times daily.     famotidine (PEPCID) 20 MG tablet Take 20 mg by mouth at bedtime as needed for indigestion.     liothyronine (CYTOMEL) 5 MCG tablet Take 2 tablets (10 mcg total) by mouth See admin instructions. Take 10 mcg before breakfast. (Patient taking differently: Take 5 mcg by mouth in the morning and at bedtime.) 180 tablet 3   MAGNESIUM PO Take 1 capsule by mouth 2 (two) times daily.     Melatonin 10 MG TABS Take 10 mg by mouth at bedtime.     Menthol, Topical Analgesic, (BIOFREEZE EX) Apply 1 Application topically at bedtime as needed (leg pain).     Misc Natural Products (OSTEO BI-FLEX JOINT SHIELD PO) Take 1 tablet by mouth 2 (two) times daily.     Multiple Vitamins-Minerals (ZINC PO) Take 1 tablet by mouth 2 (two) times daily.     nystatin cream (MYCOSTATIN) Apply 1 Application topically as needed for dry skin (fever blisters).     olmesartan (BENICAR) 20 MG tablet Take 1/2 tablet  Daily  for BP (Patient taking differently: Take 10 mg by mouth daily. Take 1/2 tablet  Daily  for BP) 45 tablet 3   ondansetron (ZOFRAN) 8 MG  tablet Take 1 tablet (8 mg total) by mouth every 8 (eight) hours as needed for nausea or vomiting. May take 30-60 minutes prior to Temodar administration if nausea/vomiting occurs as needed. 30 tablet 1   Polyethyl Glycol-Propyl Glycol (SYSTANE) 0.4-0.3 % GEL ophthalmic gel Place 1 Application into both eyes at bedtime.     TAURINE PO Take 1 capsule by mouth daily.     temozolomide (TEMODAR) 140 MG capsule Take 2 capsules (280 mg total) by mouth daily. Take for 5 days, then  off 23 days. May take on an empty stomach to decrease nausea & vomiting. 10 capsule 0   tretinoin (RETIN-A) 0.05 % cream Apply topically at bedtime. (Patient taking differently: Apply 1 application  topically See admin instructions. 1 application at bedtime on Monday, Wednesday, Friday) 45 g 3    REVIEW OF SYSTEMS:   Constitutional: Denies fevers, chills or abnormal weight loss Eyes: Denies blurriness of vision Ears, nose, mouth, throat, and face: Denies mucositis or sore throat Respiratory: Denies cough, dyspnea or wheezes Cardiovascular: Denies palpitation, chest discomfort or lower extremity swelling Gastrointestinal:  Denies nausea, constipation, diarrhea GU: Denies dysuria or incontinence Skin: Denies abnormal skin rashes Neurological: Per HPI Musculoskeletal: Denies joint pain, back or neck discomfort. No decrease in ROM Behavioral/Psych: Denies anxiety, disturbance in thought content, and mood instability   PHYSICAL EXAMINATION: Vitals:   01/20/22 0944 01/20/22 1208  BP: (!) 147/62 (!) 122/59  Pulse: 68 (!) 51  Resp: 18 18  Temp: 98.1 F (36.7 C) 98.5 F (36.9 C)  SpO2: 94% 97%   KPS: 80. General: Alert, cooperative, pleasant, in no acute distress Head: Normal EENT: No conjunctival injection or scleral icterus.  Lungs: Resp effort normal Cardiac: Regular rate Abdomen: Non-distended abdomen Skin: No rashes cyanosis or petechiae. Extremities: No clubbing or edema   Neurologic Exam: Mental Status:  Awake, alert, attentive to examiner. Oriented to self and environment. Language is fluent with intact comprehension.  Cranial Nerves: Visual acuity is grossly normal. Visual fields are full. Extra-ocular movements intact. No ptosis. Face is symmetric Motor: Tone and bulk are normal. Power is full in both arms and legs. Reflexes are symmetric, no pathologic reflexes present.  Sensory: Intact to light touch Gait: Orthopedic limitations  LABORATORY DATA:  I have reviewed the data as listed Lab Results  Component Value Date   WBC 6.4 01/20/2022   HGB 11.0 (L) 01/20/2022   HCT 33.9 (L) 01/20/2022   MCV 99.4 01/20/2022   PLT 239 01/20/2022   Recent Labs    01/01/22 0852 01/19/22 2220 01/20/22 0541  NA 139 138 139  K 3.4* 3.9 3.8  CL 104 102 106  CO2 '30 26 26  '$ GLUCOSE 155* 115* 108*  BUN '15 19 17  '$ CREATININE 1.07* 1.12* 0.90  CALCIUM 10.2 9.6 9.3  GFRNONAA 51* 48* >60  PROT 6.6 7.3 6.6  ALBUMIN 3.6 4.0 3.4*  AST 25 38 29  ALT '15 21 19  '$ ALKPHOS 53 46 42  BILITOT 0.6 0.9 0.9  BILIDIR  --  0.2  --   IBILI  --  0.7  --     RADIOGRAPHIC STUDIES: I have personally reviewed the radiological images as listed and agreed with the findings in the report. MR Brain W and Wo Contrast  Result Date: 01/20/2022 CLINICAL DATA:  History of lymphoma.  Some confusion. EXAM: MRI HEAD WITHOUT AND WITH CONTRAST TECHNIQUE: Multiplanar, multiecho pulse sequences of the brain and surrounding structures were obtained without and with intravenous contrast. CONTRAST:  49m GADAVIST GADOBUTROL 1 MMOL/ML IV SOLN COMPARISON:  MRI 08/28/2021 brain 12/20/2018 FINDINGS: Brain: Compared to prior exam there is marked interval increase in size of the homogeneously contrast-enhancing mass in the right frontal lobe, now measuring to 3.2 x 3.3 cm most recently 1.3 x 1.5 cm. There is marked interval increase in the degree of T2/FLAIR hyperintense signal surrounding this lesion, which now extends along the genu of the  corpus callosum (series 8, image 15) and inferiorly to the gyrus rectus. There is mass  effect the right frontal lobe with approximate 6 mm midline shift. There is a small amount of hemorrhage along the superior aspect of this mass (series 10, image 45), which may represent changes related to prior biopsy, and is unchanged. Other areas of contrast enhancement involving the right insular cortex, frontal operculum, and the corona radiata are redemonstrated, but appear less conspicuous than on prior exam. No new infarct, hydrocephalus, or extar axial fluid collection. Vascular: Normal flow voids. Skull and upper cervical spine: Normal marrow signal. Sinuses/Orbits: Bilateral lens replacement. Trace left mastoid effusion. Other: None. IMPRESSION: 1. Marked interval increase in size of the homogenously contrast enhancing right frontal lobe mass, as well as the degree of surrounding T2/FLAIR hyperintense signal, which now extends along the genu of the corpus callosum. There is mass effect the right frontal lobe with approximate 6 mm leftward midline shift. 2. Other areas of contrast enhancement involving the right insular cortex, frontal operculum, and corona radiata are redemonstrated, but appear less conspicuous than on prior exam. Electronically Signed   By: Marin Roberts M.D.   On: 01/20/2022 09:05   CT Head Wo Contrast  Result Date: 01/20/2022 CLINICAL DATA:  Brain/CNS neoplasm to assess treatment response. Lymphoma with nausea and vomiting. EXAM: CT HEAD WITHOUT CONTRAST TECHNIQUE: Contiguous axial images were obtained from the base of the skull through the vertex without intravenous contrast. RADIATION DOSE REDUCTION: This exam was performed according to the departmental dose-optimization program which includes automated exposure control, adjustment of the mA and/or kV according to patient size and/or use of iterative reconstruction technique. COMPARISON:  MRI 12/19/2021.  CT 09/03/2021 FINDINGS: Brain:  Intra-axial mass lesion in the right frontal lobe measuring 2.8 x 3.2 cm in diameter. Correlation is limited due to difference in modality but the mass appears larger than on the previous MRI and CT studies. There is surrounding vasogenic edema similar to previous studies. Right to left midline shift of about 7 mm. Mass effect is increased since the previous MR. Mild effacement of the anterior horn of the right lateral ventricle. No acute hemorrhage. No new lesions are identified although MRI would be more sensitive for detection of intracranial lesions. Vascular: No hyperdense vessel or unexpected calcification. Skull: Normal. Negative for fracture or focal lesion. Sinuses/Orbits: No acute finding. Other: None. IMPRESSION: 2.8 x 3.2 cm right frontal parenchymal mass with surrounding vasogenic edema. Right to left midline shift of about 7 mm. There appears to be progression in size of the lesion and in mass effect since the prior MRI. Electronically Signed   By: Lucienne Capers M.D.   On: 01/20/2022 01:04    ASSESSMENT & PLAN:  Primary CNS Lymphoma  Shauniece Kwan is clinically improved at or near prior baseline at this time.  Etiology of transient symptoms with right leg are unclear; does not clearly localize to lesional sites from the [place].  She lacks objective findings of spine/root localization at this time as well.   Regardless, her brain MRI demonstrates clear progression of lymphoma within right frontal region, with doubling of enhancing volume in just a month.  Fortunately this area remains non-eloquent, for the most part.  We discussed goals of care and treatment plan options moving forward.  This included resumption of high dose methotrexate, whole brain radiation, hospice transition.  We spoke to her daughter Tobin Chad, over the phone regarding goals of care; they are still undecided on preferred next steps.  These discussions will continue later this week and further once  discharged.  All  questions were answered. The patient knows to call the clinic with any problems, questions or concerns.  The total time spent in the encounter was 60 minutes and more than 50% was on counseling and review of test results     [name], MD 01/20/2022 12:23 PM

## 2022-01-20 NOTE — Progress Notes (Signed)
   This pt is active with [facility]- A [facility] based palliative [facility] provided [facility].  We will follow this pt in hospital and participate with coordination of care if needed. Webb Silversmith RN [phone]

## 2022-01-20 NOTE — Progress Notes (Signed)
ACP Documentation:  Per my discussions with the patient today, she has expressed her wishes to be DNR/DNI.     Daisy Bertin, DO Hospitalist

## 2022-01-20 NOTE — Progress Notes (Signed)
No charge note  Patient seen and examined this morning, H&P reviewed and agree with the assessment and plan.  In brief, this is an 86 year old female with history of CNS lymphoma undergoing chemotherapy, HTN, hypothyroidism who comes in with intermittent nausea, dizziness, worsening right-sided weakness.  Preliminary imaging in the ED with a CT scan shows a 2.8 x 3.2 cm right frontal parenchymal mass with vasogenic edema and right to left shift of about 7 mm, progressed in size.  Neurooncology, Dr. Mickeal Skinner consulted, patient was started on steroids and an MRI of the brain is pending  She is feeling a little bit better this morning, dizziness is better.  Has not been up yet.  Complains of leg cramping mainly on the right side  Continue Decadron, PT eval, MRI of the brain and follow oncology recommendations  Scheduled Meds:  dexamethasone (DECADRON) injection  4 mg Intravenous Q6H   liothyronine  5 mcg Oral BID   Continuous Infusions: PRN Meds:.acetaminophen **OR** acetaminophen, gadopiclenol, ondansetron (ZOFRAN) IV  Baljit Liebert M. Cruzita Lederer, MD, PhD [facility]  Between 7 am - 7 pm you can contact me via Amion (for emergencies) or [place] (non urgent matters).  I am not available 7 pm - 7 am, please contact night coverage MD/APP via Amion

## 2022-01-20 NOTE — Progress Notes (Signed)
PT Cancellation Note  Patient Details Name: Daisy Oliver MRN: [id number] DOB: 03-22-1936   Cancelled Treatment:    Reason Eval/Treat Not Completed: Fatigue/lethargy limiting ability to participate, will check back tomorrow. [place] Office [phone] Weekend pager-[phone]    Claretha Cooper 01/20/2022, 4:06 PM

## 2022-01-20 NOTE — ED Notes (Signed)
Patient transported to MRI 

## 2022-01-20 NOTE — ED Provider Notes (Signed)
[facility] DEPT Provider Note  CSN: [id number] Arrival date & time: 01/19/22 2201  Chief Complaint(s) Weakness, Fall, Nausea, and Dizziness  HPI Daisy Oliver is a 86 y.o. 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  She reports residual left leg weakness and swelling due to her cancer.  Patient reports that this occurred this evening after having dinner.  Her husband attempted to walk her to the bedroom and placed her on the bed when she slipped off the bed onto the floor.  She denied any head trauma or loss of consciousness.  No visual disturbance, slurred speech or facial droop.  No associated chest pain or shortness of breath.  No other physical complaints.  The history is provided by the patient.    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18   Cancer [facility])    CNS lymphoma ([place])    COVID-19 08/26/2020   Fracture of radial head, right, closed 09/16/2012   GERD (gastroesophageal reflux disease)    pepcid prn   Hypertension    Hypothyroidism    PONV (postoperative nausea and vomiting)    Patient Active Problem List   Diagnosis Date Noted   CNS lymphoma ([place]) 01/20/2022   Nausea & vomiting 01/20/2022   Leukocytosis 01/20/2022   Vasogenic edema ([place]) 01/20/2022   Hypokalemia 12/05/2021   Abdominal aortic atherosclerosis ([place]) - per PET 09/22/2021 09/24/2021   B12 deficiency 09/23/2021   Bowel and bladder incontinence 09/23/2021   Lumbar back pain 09/23/2021   Status post stereotactic brain biopsy 09/02/2021   Primary CNS lymphoma ([place]) 08/28/2021   Neoplasm of brain causing mass effect and brain compression on adjacent structures ([place]) 08/27/2021   Age-related osteoporosis  without current pathological fracture 10/10/2020   CKD (chronic kidney disease) stage 2, GFR 60-89 ml/min 03/29/2020   History of basal cell cancer 03/17/2018   Vitamin D deficiency 08/28/2014   Medication management 08/28/2014   Acquired hypothyroidism 08/28/2014   Essential hypertension 08/25/2013   Hyperlipidemia 08/25/2013   Migraines 08/25/2013   Rosacea 08/25/2013   Home Medication(s) Prior to Admission medications   Medication Sig Start Date End Date Taking? Authorizing Provider  acetaminophen (TYLENOL) 500 MG tablet Take 500 mg by mouth 2 (two) times daily as needed (pain).   Yes [provider]  Ascorbic Acid (VITAMIN C PO) Take 1 tablet by mouth daily.   Yes [provider]  BIOTIN PO Take 1 tablet by mouth 2 (two) times daily.   Yes [provider]  famotidine (PEPCID) 20 MG tablet Take 20 mg by mouth at bedtime as needed for indigestion.   Yes [provider]  liothyronine (CYTOMEL) 5 MCG tablet Take 2 tablets (10 mcg total) by mouth See admin instructions. Take 10 mcg before breakfast. Patient taking differently: Take 5 mcg by mouth in the morning and at bedtime. 09/24/21  Yes Liane Comber, NP  MAGNESIUM PO Take 1 capsule by mouth 2 (two) times daily.   Yes [provider]  Melatonin 10 MG TABS Take 10 mg by mouth at bedtime.   Yes [provider]  Menthol, Topical Analgesic, (BIOFREEZE EX) Apply 1 Application topically at bedtime as needed (leg pain).   Yes [provider]  Misc Natural  Products (OSTEO BI-FLEX JOINT SHIELD PO) Take 1 tablet by mouth 2 (two) times daily.   Yes [provider]  Multiple Vitamins-Minerals (ZINC PO) Take 1 tablet by mouth 2 (two) times daily.   Yes [provider]  nystatin cream (MYCOSTATIN) Apply 1 Application topically as needed for dry skin (fever blisters).   Yes [provider]  olmesartan (BENICAR) 20 MG tablet Take 1/2 tablet  Daily  for BP Patient  taking differently: Take 10 mg by mouth daily. Take 1/2 tablet  Daily  for BP 08/13/21  Yes Liane Comber, NP  ondansetron (ZOFRAN) 8 MG tablet Take 1 tablet (8 mg total) by mouth every 8 (eight) hours as needed for nausea or vomiting. May take 30-60 minutes prior to Temodar administration if nausea/vomiting occurs as needed. 12/25/21  Yes Vaslow, Acey Lav, MD  Polyethyl Glycol-Propyl Glycol (SYSTANE) 0.4-0.3 % GEL ophthalmic gel Place 1 Application into both eyes at bedtime.   Yes [provider]  TAURINE PO Take 1 capsule by mouth daily.   Yes [provider]  temozolomide (TEMODAR) 140 MG capsule Take 2 capsules (280 mg total) by mouth daily. Take for 5 days, then off 23 days. May take on an empty stomach to decrease nausea & vomiting. 12/29/21  Yes Vaslow, Acey Lav, MD  tretinoin (RETIN-A) 0.05 % cream Apply topically at bedtime. Patient taking differently: Apply 1 application  topically See admin instructions. 1 application at bedtime on Monday, Wednesday, Friday 11/23/21  Yes [name], MD                                                                                                                                    Allergies Barbiturates, Other, Pentothal [thiopental], and Sulfa antibiotics  Review of Systems Review of Systems As noted in HPI  Physical Exam Vital Signs  I have reviewed the triage vital signs BP 119/69   Pulse 67   Temp 98 F (36.7 C) (Oral)   Resp 14   SpO2 95%   Physical Exam Vitals reviewed.  Constitutional:      General: She is not in acute distress.    Appearance: She is well-developed. She is not diaphoretic.  HENT:     Head: Normocephalic and atraumatic.     Nose: Nose normal.  Eyes:     General: No scleral icterus.       Right eye: No discharge.        Left eye: No discharge.     Conjunctiva/sclera: Conjunctivae normal.     Pupils: Pupils are equal, round, and reactive to light.  Cardiovascular:     Rate and Rhythm:  Normal rate and regular rhythm.     Heart sounds: No murmur heard.    No friction rub. No gallop.  Pulmonary:     Effort: Pulmonary effort is normal. No respiratory distress.     Breath sounds: Normal breath sounds. No stridor.  No rales.  Abdominal:     General: There is no distension.     Palpations: Abdomen is soft.     Tenderness: There is no abdominal tenderness.  Musculoskeletal:        General: No tenderness.     Cervical back: Normal range of motion and neck supple.     Left lower leg: Swelling present.  Skin:    General: Skin is warm and dry.     Findings: No erythema or rash.  Neurological:     Mental Status: She is alert and oriented to person, place, and time.     Comments: Mental Status:  Alert and oriented to person, place, and time.  Attention and concentration normal.  Speech clear.  Recent memory is intact  Cranial Nerves:  II Visual Fields: Intact to confrontation. Visual fields intact. III, IV, VI: Pupils equal and reactive to light and near. Full eye movement without nystagmus  V Facial Sensation: Normal. No weakness of masticatory muscles  VII: No facial weakness or asymmetry  VIII Auditory Acuity: Grossly normal  IX/X: The uvula is midline; the palate elevates symmetrically  XI: Normal sternocleidomastoid and trapezius strength  XII: The tongue is midline. No atrophy or fasciculations.   Motor System: Muscle Strength: 5/5 and symmetric in the upper extremities. 4/5 and symmetric in the upper extremities.  Muscle Tone: Tone and muscle bulk are normal in the upper and lower extremities.  Reflexes: No Clonus Coordination: No tremor.  Sensation: Intact to light touch. Gait: deferred      ED Results and Treatments Labs (all labs ordered are listed, but only abnormal results are displayed) Labs Reviewed  BASIC METABOLIC PANEL - Abnormal; Notable for the following components:      Result Value   Glucose, Bld 115 (*)    Creatinine, Ser 1.12 (*)     GFR, Estimated 48 (*)    All other components within normal limits  CBC - Abnormal; Notable for the following components:   WBC 11.3 (*)    RBC 3.72 (*)    All other components within normal limits  URINALYSIS, ROUTINE W REFLEX MICROSCOPIC - Abnormal; Notable for the following components:   APPearance HAZY (*)    Ketones, ur 5 (*)    Leukocytes,Ua TRACE (*)    Bacteria, UA RARE (*)    All other components within normal limits  CBC WITH DIFFERENTIAL/PLATELET - Abnormal; Notable for the following components:   RBC 3.41 (*)    Hemoglobin 11.0 (*)    HCT 33.9 (*)    Lymphs Abs 0.5 (*)    All other components within normal limits  COMPREHENSIVE METABOLIC PANEL - Abnormal; Notable for the following components:   Glucose, Bld 108 (*)    Albumin 3.4 (*)    All other components within normal limits  CBG MONITORING, ED - Abnormal; Notable for the following components:   Glucose-Capillary 109 (*)    All other components within normal limits  HEPATIC FUNCTION PANEL  MAGNESIUM  TSH  EKG  EKG Interpretation  Date/Time:  Monday January 19 2022 22:43:25 EDT Ventricular Rate:  67 PR Interval:  187 QRS Duration: 108 QT Interval:  448 QTC Calculation: 473 R Axis:   56 Text Interpretation: Sinus rhythm Low voltage, precordial leads No significant change was found Confirmed by Addison Lank [phone]) on 01/19/2022 11:13:53 PM       Radiology CT Head Wo Contrast  Result Date: 01/20/2022 CLINICAL DATA:  Brain/CNS neoplasm to assess treatment response. Lymphoma with nausea and vomiting. EXAM: CT HEAD WITHOUT CONTRAST TECHNIQUE: Contiguous axial images were obtained from the base of the skull through the vertex without intravenous contrast. RADIATION DOSE REDUCTION: This exam was performed according to the departmental dose-optimization program which includes automated exposure  control, adjustment of the mA and/or kV according to patient size and/or use of iterative reconstruction technique. COMPARISON:  MRI 12/19/2021.  CT 09/03/2021 FINDINGS: Brain: Intra-axial mass lesion in the right frontal lobe measuring 2.8 x 3.2 cm in diameter. Correlation is limited due to difference in modality but the mass appears larger than on the previous MRI and CT studies. There is surrounding vasogenic edema similar to previous studies. Right to left midline shift of about 7 mm. Mass effect is increased since the previous MR. Mild effacement of the anterior horn of the right lateral ventricle. No acute hemorrhage. No new lesions are identified although MRI would be more sensitive for detection of intracranial lesions. Vascular: No hyperdense vessel or unexpected calcification. Skull: Normal. Negative for fracture or focal lesion. Sinuses/Orbits: No acute finding. Other: None. IMPRESSION: 2.8 x 3.2 cm right frontal parenchymal mass with surrounding vasogenic edema. Right to left midline shift of about 7 mm. There appears to be progression in size of the lesion and in mass effect since the prior MRI. Electronically Signed   By: Lucienne Capers M.D.   On: 01/20/2022 01:04    Medications Ordered in ED Medications  dexamethasone (DECADRON) injection 4 mg (4 mg Intravenous Given 01/20/22 0538)  acetaminophen (TYLENOL) tablet 650 mg (has no administration in time range)    Or  acetaminophen (TYLENOL) suppository 650 mg (has no administration in time range)  ondansetron (ZOFRAN) injection 4 mg (has no administration in time range)  liothyronine (CYTOMEL) tablet 5 mcg (has no administration in time range)  sodium chloride 0.9 % bolus 1,000 mL (1,000 mLs Intravenous New Bag/Given 01/19/22 2350)  gadopiclenol (VUEWAY) 0.5 MMOL/ML solution 7 mL (7 mLs Intravenous Contrast Given 01/20/22 0752)                                                                                                                                      Procedures .Critical Care  Performed by: Fatima Blank, MD Authorized by: Fatima Blank, MD   Critical care provider statement:    Critical care time (minutes):  45   Critical care time was exclusive of:  Separately billable procedures and  treating other patients   Critical care was necessary to treat or prevent imminent or life-threatening deterioration of the following conditions:  CNS failure or compromise   Critical care was time spent personally by me on the following activities:  Development of treatment plan with patient or surrogate, discussions with consultants, evaluation of patient's response to treatment, examination of patient, obtaining history from patient or surrogate, review of old charts, re-evaluation of patient's condition, pulse oximetry, ordering and review of radiographic studies, ordering and review of laboratory studies and ordering and performing treatments and interventions   Care discussed with: admitting provider   .1-3 Lead EKG Interpretation  Performed by: Fatima Blank, MD Authorized by: Fatima Blank, MD     Interpretation: normal     ECG rate:  72   ECG rate assessment: normal     Rhythm: sinus rhythm     Ectopy: none     Conduction: normal     (including critical care time)  Medical Decision Making / ED Course   Medical Decision Making Amount and/or Complexity of Data Reviewed External Data Reviewed: radiology.    Details: MRI noted above Labs: ordered. Decision-making details documented in ED Course. Radiology: ordered and independent interpretation performed. Decision-making details documented in ED Course.  Risk Prescription drug management. Decision regarding hospitalization.    Given patient's past medical history of CNS lymphoma, will need to assess for recurrence.  I will Considering stroke. Will assess for electrolyte/metabolic derangements. We will also assess for  infection.  CBC with mild leukocytosis.  No anemia. Metabolic panel without significant electrolyte derangement or renal sufficiency. CT head did reveal recurrence of her cancer with vasogenic edema.  I spoke with Dr. Mickeal Skinner from neurooncology who requested admission and 4 mg Decadron every 6 hours.  MRI with and without contrast.  Patient admitted to medicine for further management.        Final Clinical Impression(s) / ED Diagnoses Final diagnoses:  Vasogenic brain edema [facility])           This chart was dictated using voice recognition software.  Despite best efforts to proofread,  errors can occur which can change the documentation meaning.    Fatima Blank, MD 01/20/22 [phone]

## 2022-01-20 NOTE — H&P (Signed)
History and Physical    PLEASE NOTE THAT DRAGON DICTATION SOFTWARE WAS USED IN THE CONSTRUCTION OF THIS NOTE.   Daisy Oliver OIN:[id number] DOB: October 16, 1935 DOA: 01/19/2022  PCP: [name], MD  Patient coming from: home   I have personally briefly reviewed patient's old medical records in [place]  Chief Complaint: Nausea vomiting  HPI: Daisy Oliver is a 86 y.o. female with medical history significant for CNS lymphoma undergoing chemotherapy, essential hypertension, acquired hypothyroidism, who is admitted to [facility] on 01/19/2022 with suspected progression of CNS lymphoma along with vasogenic edema after presenting from home to [facility] ED complaining of nausea/vomiting.   The patient reports 1 day of intermittent nausea, which is new for her, resulting in an episode of nonbloody, nonbilious emesis prior to presenting to [facility] emergency department this evening.  Over the last day, she also notes new onset dizziness, in the absence of vertigo.   In the setting of CNS lymphoma for which she is undergoing chemotherapy and follows with Dr. Mickeal Skinner of neuro-oncology, the patient reports chronic left lower extremity weakness.  She notes that around 1700 on 01/19/2022, development of right lower extremity weakness, which is new for her.  Denies any other acute focal weakness, nor any associated acute focal numbness, paresthesias, including no saddle anesthesia..  Not associate with any acute change in vision, dysarthria, facial droop, dysphagia.  No recent trauma.  Denies any recent urinary retention or fecal incontinence. No recent not on any blood thinners as an outpatient.  Denies any recent subjective fever, chills, rigors, or generalized myalgias.  No recent neck stiffness, rash, cough, abdominal pain, dysuria, gross hematuria.  She also denies any recent chest pain, shortness of breath, palpitations, diaphoresis, presyncope, or syncope.  The patient conveys  that she lives at home with her husband, for whom she is the primary caregiver given his underlying dementia.      ED Course:  Vital signs in the ED were notable for the following: Afebrile; heart rate 70-96; systolic blood pressures in the 100s to 120s; respiratory rate 14-22, oxygen saturation 96 to 90% on room air.  Labs were notable for the following: CMP notable for the following: Sodium 138, potassium 3.9, bicarbonate 26, creatinine 1.12 compared to 1.07 on 01/01/2022, glucose 115, liver enzymes within normal limits.  CBC notable for white blood cell count 11,600 compared to 7000 on 01/01/2022, hemoglobin 12 compared to 11 on 01/01/2022.  Urinalysis ordered, with result currently pending.  Imaging and additional notable ED work-up: EKG shows sinus rhythm with heart rate 67, normal intervals, and no evidence of T wave or ST changes, including no evidence of ST elevation.  Noncontrast CT head, compared to MRI brain on 12/19/2021 as well as compared to CT head from 01/04/2022, shows 2.8 x 3.2 cm right frontal parenchymal mass with surrounding vasogenic edema with right to left midline shift of about 7 mm; this appears to demonstrate progression in size of the right frontal lesion as well as interval increase in mass effect relative to the MRI brain on 12/19/2021.  No evidence of associated hemorrhage.   EDP discussed patient's case and imaging with the on-call neuro oncologist, Dr. Mickeal Skinner, Who will formally consult and see the patient in the morning.  In the meantime he recommends initiation of Decadron 4 mg every 6 hours as well as pursuit of MRI brain with contrast.  While in the ED, the following were administered: Decadron 4 mg IV x1.  NS x1  L bolus.  Subsequently, the patient was admitted for further evaluation management of concern regarding worsening of CNS lymphoma, with CT imaging suggestive of interval increase in size of right frontal parenchymal mass , now with radiographic evidence of  vasogenic edema, along with interval worsening of right to left midline shift compared to MRI brain 1 month ago.    Review of Systems: As per HPI otherwise 10 point review of systems negative.   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 Herbert Deaner   FRAMELESS  BIOPSY WITH BRAINLAB Right 09/02/2021   Procedure: FRONTAL STEREOTACTIC BRAIN BIOPSY;  Surgeon: Vallarie Mare, MD;  Location: [place];  Service: Neurosurgery;  Laterality: Right;   IR IMAGING GUIDED PORT INSERTION  09/24/2021   RADIAL HEAD ARTHROPLASTY Right 09/16/2012   Procedure: RADIAL HEAD ARTHROPLASTY;  Surgeon: Johnny Bridge, MD;  Location: [place];  Service: Orthopedics;  Laterality: Right;  RADIAL HEAD REPLACEMENT      Social History:  reports that she quit smoking about 38 years ago. Her smoking use included cigarettes. She has a 44.00 pack-year smoking history. She has never used smokeless tobacco. She reports that she does not currently use alcohol. She reports that she does not use drugs.   Allergies  Allergen Reactions   Barbiturates Other (See Comments)    Unknown reaction   Other Other (See Comments)    Patient has AIP and is not to have hormones or red wine   Pentothal [Thiopental] Other (See Comments)    Unknown reaction   Sulfa Antibiotics Other (See Comments)    Unknown reaction    Family History  Problem Relation  Age of Onset   Heart disease Mother    Brain cancer Father 19       Brain tumor, not cancerous   Hypertension Sister    Osteoporosis Sister    Arthritis Sister    Scoliosis Sister    Hypertension Sister    Osteoporosis Sister    Cancer Paternal Grandfather    Suicidality Paternal Grandfather    Breast cancer Neg Hx     Family history reviewed and not pertinent    Prior to Admission medications   Medication Sig Start Date End Date Taking? Authorizing Provider  acetaminophen (TYLENOL) 500 MG tablet Take 500 mg by mouth 2 (two) times daily as needed (pain).   Yes [provider]  Ascorbic Acid (VITAMIN C PO) Take 1 tablet by mouth daily.   Yes [provider]  BIOTIN PO Take 1 tablet by mouth 2 (two) times daily.   Yes [provider]  famotidine (PEPCID) 20 MG tablet Take 20 mg by mouth at bedtime as needed for indigestion.   Yes [provider]  liothyronine (CYTOMEL) 5 MCG tablet Take 2 tablets (10 mcg total) by mouth See admin instructions. Take 10 mcg before breakfast. Patient taking  differently: Take 5 mcg by mouth in the morning and at bedtime. 09/24/21  Yes Liane Comber, NP  MAGNESIUM PO Take 1 capsule by mouth 2 (two) times daily.   Yes [provider]  Melatonin 10 MG TABS Take 10 mg by mouth at bedtime.   Yes [provider]  Menthol, Topical Analgesic, (BIOFREEZE EX) Apply 1 Application topically at bedtime as needed (leg pain).   Yes [provider]  Misc Natural Products (OSTEO BI-FLEX JOINT SHIELD PO) Take 1 tablet by mouth 2 (two) times daily.   Yes [provider]  Multiple Vitamins-Minerals (ZINC PO) Take 1 tablet by mouth 2 (two) times daily.   Yes [provider]  nystatin cream (MYCOSTATIN) Apply 1 Application topically as needed for dry skin (fever blisters).   Yes [provider]  olmesartan (BENICAR) 20 MG tablet Take 1/2 tablet  Daily  for BP Patient taking differently:  Take 10 mg by mouth daily. Take 1/2 tablet  Daily  for BP 08/13/21  Yes Liane Comber, NP  ondansetron (ZOFRAN) 8 MG tablet Take 1 tablet (8 mg total) by mouth every 8 (eight) hours as needed for nausea or vomiting. May take 30-60 minutes prior to Temodar administration if nausea/vomiting occurs as needed. 12/25/21  Yes Vaslow, Acey Lav, MD  Polyethyl Glycol-Propyl Glycol (SYSTANE) 0.4-0.3 % GEL ophthalmic gel Place 1 Application into both eyes at bedtime.   Yes [provider]  TAURINE PO Take 1 capsule by mouth daily.   Yes [provider]  temozolomide (TEMODAR) 140 MG capsule Take 2 capsules (280 mg total) by mouth daily. Take for 5 days, then off 23 days. May take on an empty stomach to decrease nausea & vomiting. 12/29/21  Yes Vaslow, Acey Lav, MD  tretinoin (RETIN-A) 0.05 % cream Apply topically at bedtime. Patient taking differently: Apply 1 application  topically See admin instructions. 1 application at bedtime on Monday, Wednesday, Friday 11/23/21  Yes [name], MD     Objective    Physical Exam: Vitals:   01/20/22 0051 01/20/22 0100 01/20/22 0141 01/20/22 0241  BP: 114/82   (!) 110/55  Pulse: 88 68  68  Resp: (!) _0 Temp:   97.7 F (36.5 C)   TempSrc:   Oral   SpO2: 98% 97%  96%    General: appears to be stated age; alert, oriented Skin: warm, dry, no rash Head:  AT/[place] Mouth:  Oral mucosa membranes appear dry, normal dentition Neck: supple; trachea midline Heart:  RRR; did not appreciate any M/R/G Lungs: CTAB, did not appreciate any wheezes, rales, or rhonchi Abdomen: + BS; soft, ND, NT Vascular: 2+ pedal pulses b/l; 2+ radial pulses b/l Extremities: no peripheral edema, no muscle wasting Neuro: sensation intact in upper and lower extremities b/l; 5/5 strength in b/l upper extremities; 4/5 strength in b/l lower extremities.  cranial nerves II through XII grossly intact; no evidence suggestive of slurred speech, dysarthria, or facial  droop; Normal muscle tone. No tremors.    Labs on Admission: I have personally reviewed following labs and imaging studies  CBC: Recent Labs  Lab 01/19/22 2220  WBC 11.3*  HGB 12.0  HCT 37.2  MCV 100.0  PLT 409   Basic Metabolic Panel: Recent Labs  Lab 01/19/22 2220  NA 138  K 3.9  CL 102  CO2 26  GLUCOSE 115*  BUN 19  CREATININE 1.12*  CALCIUM 9.6   GFR: CrCl cannot be calculated (Unknown ideal weight.).  Liver Function Tests: Recent Labs  Lab 01/19/22 2220  AST 38  ALT 21  ALKPHOS 46  BILITOT 0.9  PROT 7.3  ALBUMIN 4.0   No results for input(s): "LIPASE", "AMYLASE" in the last 168 hours. No results for input(s): "AMMONIA" in the last 168 hours. Coagulation Profile: No results for input(s): "INR", "PROTIME" in the last 168 hours. Cardiac Enzymes: No results for input(s): "CKTOTAL", "CKMB", "CKMBINDEX", "TROPONINI" in the last 168 hours. BNP (last 3 results) No results for input(s): "PROBNP" in the last 8760 hours. HbA1C: No results for input(s): "HGBA1C" in the last 72 hours. CBG: Recent Labs  Lab 01/19/22 2230  GLUCAP 109*   Lipid Profile: No results for input(s): "CHOL", "HDL", "LDLCALC", "TRIG", "CHOLHDL", "LDLDIRECT" in the last 72 hours. Thyroid Function Tests: No results for input(s): "TSH", "T4TOTAL", "FREET4", "T3FREE", "THYROIDAB" in the last 72 hours. Anemia Panel: No results for input(s): "VITAMINB12", "FOLATE", "FERRITIN", "TIBC", "IRON", "RETICCTPCT" in the last 72 hours. Urine analysis:    Component Value Date/Time   COLORURINE STRAW (A) 12/07/2021 0432   APPEARANCEUR CLEAR 12/07/2021 0432   LABSPEC 1.005 12/07/2021 0432   PHURINE 9.0 (H) 12/07/2021 0432   GLUCOSEU NEGATIVE 12/07/2021 0432   HGBUR NEGATIVE 12/07/2021 0432   BILIRUBINUR NEGATIVE 12/07/2021 0432   KETONESUR NEGATIVE 12/07/2021 0432   PROTEINUR NEGATIVE 12/07/2021 0432   UROBILINOGEN 0.2 08/28/2014 0958   NITRITE NEGATIVE 12/07/2021 0432   LEUKOCYTESUR NEGATIVE  12/07/2021 0432    Radiological Exams on Admission: CT Head Wo Contrast  Result Date: 01/20/2022 CLINICAL DATA:  Brain/CNS neoplasm to assess treatment response. Lymphoma with nausea and vomiting. EXAM: CT HEAD WITHOUT CONTRAST TECHNIQUE: Contiguous axial images were obtained from the base of the skull through the vertex without intravenous contrast. RADIATION DOSE REDUCTION: This exam was performed according to the departmental dose-optimization program which includes automated exposure control, adjustment of the mA and/or kV according to patient size and/or use of iterative reconstruction technique. COMPARISON:  MRI 12/19/2021.  CT 09/03/2021 FINDINGS: Brain: Intra-axial mass lesion in the right frontal lobe measuring 2.8 x 3.2 cm in diameter. Correlation is limited due to difference in modality but the mass appears larger than on the previous MRI and CT studies. There is surrounding vasogenic edema similar to previous studies. Right to left midline shift of about 7 mm. Mass effect is increased since the previous MR. Mild effacement of the anterior horn of the right lateral ventricle. No acute hemorrhage. No new lesions are identified although MRI would be more sensitive for detection of intracranial lesions. Vascular: No hyperdense vessel or unexpected calcification. Skull: Normal. Negative for fracture or focal lesion. Sinuses/Orbits: No acute finding. Other: None. IMPRESSION: 2.8 x 3.2 cm right frontal parenchymal mass with surrounding vasogenic edema. Right to left midline shift of about 7 mm. There appears to be progression in size of the lesion and in mass effect since the prior MRI. Electronically Signed   By: Lucienne Capers M.D.   On: 01/20/2022 01:04     EKG: Independently reviewed, with result as described above.    Assessment/Plan    Principal Problem:   CNS lymphoma ([place]) Active Problems:   Essential hypertension   Acquired hypothyroidism   Nausea & vomiting   Leukocytosis    Vasogenic edema (HCC)      #) Progression of CNS lymphoma: Presentation concerning for progression of known CNS lymphoma, for which she has been receiving chemo,  in the setting of 1 day of new onset nausea/vomiting, dizziness, right lower  extremity weakness, with noncontrast CT head performed in the ED this evening suggestive of interval increase in size of right frontal parenchymal mass , now with radiographic evidence of vasogenic edema, along with interval worsening of right to left midline shift, without e/o hemorrhagic or acute infarct,  compared to MRI brain  performed 1 month ago.   EDP discussed patient's case and imaging with the on-call neuro oncologist, Dr. Mickeal Skinner, Who will formally consult and see the patient in the morning.  In the meantime he recommends initiation of Decadron 4 mg every 6 hours as well as pursuit of MRI brain with contrast.  Of note, the patient's new onset right lower extremity weak is starting around 1700 on 01/19/2022 appears to be as a consequence of interval increase in right to left midline shift stemming from interval increase in size of right frontal mass identified on today CT head, along with new vasogenic edema.  Presentation less suggestive of acute stroke.  As noted patient presents to the emergency department outside of window for consideration for tPA. Will follow for result of MRI brain with contrast to further confirm the absence of acute ischemic infarct.    Plan: Neurooncology consulted, and I have added Dr. Mickeal Skinner to the treatment team for the morning per his recommendations, Decadron 4 mg IV every 6 hours.  MRI brain with contrast.  RN swallow screen prior to initiation of diet.  Every 4 hours neurochecks x4 occurrences ordered.  Fall precautions ordered.  As needed Zofran.  Close monitoring of heart rate and blood pressure to evaluate for evidence of increasing intracranial pressure.  Repeat CBC in the morning. PT/OT consults placed for AM.            #) Nausea/vomiting: 1 day of intermittent nausea resulting in this emesis, which appears to be as a consequence aforementioned vasogenic edema identified on CT head as a consequence of apparent interval increase in size of CNS lymphoma resulting in exacerbation of right to left midline shift as further detailed above.   Plan: Prn Zofran.  Further evaluation and management of suspected progression of CNS lymphoma and associated vasogenic edema, as above, including Decadron.  Monitor strict I's and O's and daily weights.  Add on serum magnesium level.  Repeat CMP and CBC in the morning.  Add on TSH in the setting of acquired hypothyroidism.            #) Leukocytosis: Presenting CBC reflects mildly elevated white cell count of 11,300, which is new compared to blood cell count checked on 01/02/2019.  Suspect that this inflammatory/reactive in nature in the context of interval increase in the size of his CNS lymphoma lesion resulting in new onset vasogenic edema.  There may also be a secondary element of hemoconcentration in the setting of episode of vomiting earlier today along with overall decline in oral intake over the course of the last 12 to 24 hours.  Of note, there may be ensuing further elevation in degree of leukocytosis given plan for every 6 hour Decadron administration for aforementioned vasogenic edema.  Will presentation is also associated with intermittent mild tachypnea, there is no evidence to suggest underlying infectious process at this time.  Consequently, her criteria for sepsis not currently met.  Patient appears hemodynamically stable.  Therefore we will refrain from initiation of antibiotics at this time.  Of note, UA ordered, with result currently pending.   Plan: Repeat CBC with diff in the morning.  Monitor strict I's and O's, daily  weights.  Follow-up result urinalysis.              #) Essential Hypertension: documented h/o such, with  outpatient antihypertensive regimen including olmesartan.  In the setting of SBP's in the ED today in the low 100s mmHg, will hold him olmesartan for now.  Close monitoring of ensuing blood pressure is a indicator for elevation in intracranial pressure.  Plan: Close monitoring of subsequent BP via routine VS. hold antihypertensive for now, as above.             #) acquired hypothyroidism: documented h/o such, on Cytomel as outpatient.   Plan: cont home Cytomel.  Check TSH.       DVT prophylaxis: SCD's   Code Status: DNR/DNI (confirmed per my discussions with the patient this evening) Family Communication: none Disposition Plan: Per Rounding Team Consults called: EDP discussed patient's case with the on-call neuro oncologist, Dr. Mickeal Skinner , Who will formally consult, see the patient in the morning, as further detailed above;  Admission status: Inpatient   PLEASE NOTE THAT DRAGON DICTATION SOFTWARE WAS USED IN THE CONSTRUCTION OF THIS NOTE.   [place] DO [facility] [place]   01/20/2022, 2:48 AM

## 2022-01-20 NOTE — TOC Initial Note (Signed)
Transition of Care [facility]) - Initial/Assessment Note    Patient Details  Name: Daisy Oliver MRN: [id number] Date of Birth: 1935-07-21  [facility] CM/SW Contact:    [name], RN Phone Number: 01/20/2022, 3:06 PM  Clinical Narrative: Spoke to dtr Daisy Oliver-d/c plan return home.Has private duty caregivers.                  Expected Discharge Plan: Home/Self Care Barriers to Discharge: Continued Medical Work up   Patient Goals and CMS Choice Patient states their goals for this hospitalization and ongoing recovery are::  (Home) CMS Medicare.gov Compare Post Acute Care list provided to:: Patient Represenative (must comment) (Daisy Oliver(dtr)) Choice offered to / list presented to : Adult Children  Expected Discharge Plan and Services Expected Discharge Plan: Home/Self Care   Discharge Planning Services: CM Consult Post Acute Care Choice: NA Living arrangements for the past 2 months: Single Family Home                                      Prior Living Arrangements/Services Living arrangements for the past 2 months: Single Family Home Lives with:: Adult Children Patient language and need for interpreter reviewed:: Yes Do you feel safe going back to the place where you live?: Yes      Need for Family Participation in Patient Care: Yes (Comment) Care giver support system in place?: Yes (comment) Current home services: Other (comment) (Private Duty Ambulance person) Criminal Activity/Legal Involvement Pertinent to Current Situation/Hospitalization: No - Comment as needed  Activities of Daily Living Home Assistive Devices/Equipment: Eyeglasses ADL Screening (condition at time of admission) Patient's cognitive ability adequate to safely complete daily activities?: Yes Is the patient deaf or have difficulty hearing?: No Does the patient have difficulty seeing, even when wearing glasses/contacts?: No Does the patient have difficulty concentrating,  remembering, or making decisions?: No Patient able to express need for assistance with ADLs?: Yes Does the patient have difficulty dressing or bathing?: No Independently performs ADLs?: Yes (appropriate for developmental age) Does the patient have difficulty walking or climbing stairs?: Yes Weakness of Legs: Both Weakness of Arms/Hands: Left  Permission Sought/Granted Permission sought to share information with : Case Manager Permission granted to share information with : Yes, Verbal Permission Granted  Share Information with NAME:  (Case manager)           Emotional Assessment Appearance:: Appears stated age Attitude/Demeanor/Rapport: Gracious Affect (typically observed): Accepting Orientation: : Oriented to Self, Oriented to Place, Oriented to  Time, Oriented to Situation Alcohol / Substance Use: Not Applicable Psych Involvement: No (comment)  Admission diagnosis:  Vasogenic brain edema (HCC) [G93.6] CNS lymphoma (HCC) [C85.89] Patient Active Problem List   Diagnosis Date Noted   CNS lymphoma ([place]) 01/20/2022   Nausea & vomiting 01/20/2022   Leukocytosis 01/20/2022   Vasogenic edema ([place]) 01/20/2022   Hypokalemia 12/05/2021   Abdominal aortic atherosclerosis ([place]) - per PET 09/22/2021 09/24/2021   B12 deficiency 09/23/2021   Bowel and bladder incontinence 09/23/2021   Lumbar back pain 09/23/2021   Status post stereotactic brain biopsy 09/02/2021   Primary CNS lymphoma ([place]) 08/28/2021   Neoplasm of brain causing mass effect and brain compression on adjacent structures ([place]) 08/27/2021   Age-related osteoporosis without current pathological fracture 10/10/2020   CKD (chronic kidney disease) stage 2, GFR 60-89 ml/min 03/29/2020   History of basal cell cancer 03/17/2018   Vitamin D  deficiency 08/28/2014   Medication management 08/28/2014   Acquired hypothyroidism 08/28/2014   Essential hypertension 08/25/2013   Hyperlipidemia 08/25/2013   Migraines 08/25/2013   Rosacea  08/25/2013   PCP:  [name], MD Pharmacy:   [id number] [place], [place] - [address] [place] [address]Phone: [phone]: [phone] CSan Geronimo [place]Phone: [phone]: [phone] [place]Phone: [phone]: [phone]    Social Determinants of Health (SDOH) Interventions    Readmission Risk Interventions    10/29/2021   10:49 AM  Readmission Risk Prevention Plan  Transportation Screening Complete  PCP or Specialist Appt within 5-7 Days Complete  Home Care Screening Complete  Medication Review (RN CM) Complete

## 2022-01-21 ENCOUNTER — Other Ambulatory Visit (HOSPITAL_COMMUNITY): Payer: Self-pay

## 2022-01-21 DIAGNOSIS — G936 Cerebral edema: Secondary | ICD-10-CM | POA: Diagnosis not present

## 2022-01-21 DIAGNOSIS — R112 Nausea with vomiting, unspecified: Secondary | ICD-10-CM | POA: Diagnosis not present

## 2022-01-21 DIAGNOSIS — C8589 Other specified types of non-Hodgkin lymphoma, extranodal and solid organ sites: Secondary | ICD-10-CM | POA: Diagnosis not present

## 2022-01-21 DIAGNOSIS — D649 Anemia, unspecified: Secondary | ICD-10-CM

## 2022-01-21 NOTE — Evaluation (Signed)
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 She walked ~300 feet with her cane. She tolerated distance well. Intermittent unsteadiness but no overt LOB. Pt would like to resume OP PT once she feels ready. Will plan to follow patient during this hospital stay.        Recommendations for follow up therapy are one component of a multi-disciplinary discharge planning process, led by the attending physician.  Recommendations may be updated based on patient status, additional functional criteria and insurance authorization.  Follow Up Recommendations Outpatient PT      Assistance Recommended at Discharge PRN  Patient can return home with the following  Assist for transportation;Assistance with cooking/housework    Equipment Recommendations None recommended by PT  Recommendations for Other Services       Functional Status Assessment Patient has had a recent decline in their functional status and demonstrates the ability to make significant improvements in function in a reasonable and predictable amount of time.     Precautions / Restrictions Restrictions Weight Bearing Restrictions: No      Mobility  Bed Mobility               General bed mobility comments: oob in recliner    Transfers Overall transfer level: Modified independent   Transfers: Sit to/from Stand Sit to Stand: Modified independent (Device/Increase time)                Ambulation/Gait Ambulation/Gait assistance: Min guard Gait Distance (Feet): 300 Feet Assistive device: Straight cane Gait Pattern/deviations: Step-through pattern, Decreased stride length        General Gait Details: Min guard for safety. Pt tolerated distance well. No LOB with cane use.  Stairs            Wheelchair Mobility    Modified Rankin (Stroke Patients Only)       Balance Overall balance assessment: Mild deficits observed, not formally tested                                           Pertinent Vitals/Pain Pain Assessment Pain Assessment: No/denies pain    Home Living Family/patient expects to be discharged to:: Private residence Living Arrangements: Spouse/significant other Available Help at Discharge: Friend(s) Type of Home: House Home Access: Stairs to enter Entrance Stairs-Rails: Right Entrance Stairs-Number of Steps: 5   Home Layout: Multi-level;Laundry or work area in [place]: Kasandra Knudsen - single point      Prior Function Prior Level of Function : Independent/Modified Independent             Mobility Comments: uses cane intermittently for ambulation ADLs Comments: She was independent with ADLs, her and her spouse had hired assist for cleaning, she has not driven since starting chemo, and she was independent with cooking.     Hand Dominance   Dominant Hand: Right    Extremity/Trunk Assessment   Upper Extremity Assessment Upper Extremity Assessment: Defer to OT evaluation    Lower Extremity Assessment Lower Extremity Assessment: Generalized weakness    Cervical / Trunk Assessment Cervical / Trunk Assessment: Normal  Communication  Communication: No difficulties  Cognition Arousal/Alertness: Awake/alert Behavior During Therapy: WFL for tasks assessed/performed Overall Cognitive Status: Within Functional Limits for tasks assessed                                          General Comments      Exercises     Assessment/Plan    PT Assessment Patient needs continued PT services  PT Problem List Decreased strength;Decreased mobility;Decreased activity tolerance;Decreased  balance;Decreased knowledge of use of DME       PT Treatment Interventions DME instruction;Therapeutic activities;Therapeutic exercise;Gait training;Patient/family education;Balance training;Functional mobility training    PT Goals (Current goals can be found in the Care Plan section)  Acute Rehab PT Goals Patient Stated Goal: home soon. resume OPPT PT Goal Formulation: With patient Time For Goal Achievement: 02/04/22 Potential to Achieve Goals: Good    Frequency Min 3X/week     Co-evaluation               AM-PAC PT "6 Clicks" Mobility  Outcome Measure Help needed turning from your back to your side while in a flat bed without using bedrails?: None Help needed moving from lying on your back to sitting on the side of a flat bed without using bedrails?: None Help needed moving to and from a bed to a chair (including a wheelchair)?: None Help needed standing up from a chair using your arms (e.g., wheelchair or bedside chair)?: None Help needed to walk in hospital room?: A Little Help needed climbing 3-5 steps with a railing? : A Little 6 Click Score: 22    End of Session Equipment Utilized During Treatment: Gait belt Activity Tolerance: Patient tolerated treatment well Patient left: in chair;with call bell/phone within reach   PT Visit Diagnosis: Muscle weakness (generalized) (M62.81);Difficulty in walking, not elsewhere classified (R26.2);Unsteadiness on feet (R26.81)    Time: 1941-7408 PT Time Calculation (min) (ACUTE ONLY): 12 min   Charges:   PT Evaluation $PT Eval Low Complexity: Kathleen, PT Acute Rehabilitation  Office: [phone] Pager: [phone]

## 2022-01-21 NOTE — Assessment & Plan Note (Addendum)
-   MRI brain obtained on 01/20/2022 shows marked interval increase in underlying right frontal lobe mass; there is mass effect on the right frontal lobe with 6 mm leftward midline shift -Patient seen by Dr. Mickeal Skinner during hospitalization, appreciate assistance - improved with IV decadron; de-escalated to PO decadron at discharge; discussed with Dr. Mickeal Skinner prior to discharge

## 2022-01-21 NOTE — Assessment & Plan Note (Signed)
-   Suspected due to progression of underlying lymphoma - Continue steroids - Nausea and vomiting have improved since admission

## 2022-01-21 NOTE — Assessment & Plan Note (Signed)
-   continue decadron

## 2022-01-21 NOTE — Assessment & Plan Note (Signed)
-   Continue liothyronine

## 2022-01-21 NOTE — Hospital Course (Addendum)
Daisy Oliver is an 86 yo female with PMH acute intermittent porphyria, CNS lymphoma, GERD, HTN, hypothyroidism who presented with worsening right-sided weakness, nausea, and dizziness. She follows with Dr. Mickeal Skinner with oncology. MRI brain obtained on 01/20/2022 showed marked interval increase in right frontal lobe mass with mass effect and 6 mm leftward midline shift. She was initiated on steroids. She clinically improved with steroids and was considered stable for discharging home with follow up with oncology.

## 2022-01-21 NOTE — Assessment & Plan Note (Addendum)
-   continue home regimen  

## 2022-01-21 NOTE — Progress Notes (Signed)
Progress Note    Bryan Omura   EVO:[id number]  DOB: 22-Aug-1935  DOA: 01/19/2022     1 PCP: [name], MD  Initial CC: dizziness, N/V  Hospital Course: Ms. Fragoso is an 86 yo female with PMH acute intermittent porphyria, CNS lymphoma, GERD, HTN, hypothyroidism who presented with worsening right-sided weakness, nausea, and dizziness. She follows with Dr. Mickeal Skinner with oncology. MRI brain obtained on 01/20/2022 showed marked interval increase in right frontal lobe mass with mass effect and 6 mm leftward midline shift. She was initiated on steroids.  Interval History:  No events overnight.  Patient sitting up in chair comfortably this morning.  She states that she is overall feeling better than when she came in.  Still wobbly when moving some but her nausea and vomiting have improved.  Assessment and Plan: * CNS lymphoma ([place]) - MRI brain obtained on 01/20/2022 shows marked interval increase in underlying right frontal lobe mass; there is mass effect on the right frontal lobe with 6 mm leftward midline shift -Patient seen by Dr. Mickeal Skinner during hospitalization - Continue IV Decadron for now - Further transition to oral steroids to be determined  Vasogenic edema ([place]) - continue decadron  Nausea & vomiting - Suspected due to progression of underlying lymphoma - Continue steroids - Nausea and vomiting have improved since admission  Normocytic anemia - Baseline hemoglobin around 10 to 11 g/dL - Currently at baseline  Acquired hypothyroidism - Continue liothyronine  Essential hypertension - Blood pressure currently controlled without medication - will resume as able   Old records reviewed in assessment of this patient  Antimicrobials:   DVT prophylaxis:  SCDs Start: 01/20/22 0246   Code Status:   Code Status: DNR  Mobility Assessment (last 72 hours)     Mobility Assessment     Row Name 01/21/22 1323 01/21/22 0945 01/20/22 2022 01/20/22 1358     Does  patient have an order for bedrest or is patient medically unstable -- -- No - Continue assessment No - Continue assessment    What is the highest level of mobility based on the progressive mobility assessment? Level 5 (Walks with assist in room/hall) - Balance while stepping forward/back and can walk in room with assist - Complete Level 5 (Walks with assist in room/hall) - Balance while stepping forward/back and can walk in room with assist - Complete Level 5 (Walks with assist in room/hall) - Balance while stepping forward/back and can walk in room with assist - Complete Level 5 (Walks with assist in room/hall) - Balance while stepping forward/back and can walk in room with assist - Complete             Barriers to discharge:  Disposition Plan:  Home Status is: Inpt  Objective: Blood pressure 127/66, pulse (!) 58, temperature 97.9 F (36.6 C), temperature source Oral, resp. rate 16, height '5\' 4"'$  (1.626 m), weight 69.4 kg, SpO2 94 %.  Examination:  Physical Exam Constitutional:      Appearance: Normal appearance.  HENT:     Head: Normocephalic and atraumatic.     Mouth/Throat:     Mouth: Mucous membranes are moist.  Eyes:     Extraocular Movements: Extraocular movements intact.  Cardiovascular:     Rate and Rhythm: Normal rate and regular rhythm.  Pulmonary:     Effort: Pulmonary effort is normal.     Breath sounds: Normal breath sounds.  Abdominal:     General: Bowel sounds are normal. There is no distension.  Palpations: Abdomen is soft.  Musculoskeletal:        General: Normal range of motion.     Cervical back: Normal range of motion and neck supple.  Skin:    General: Skin is warm.  Neurological:     Mental Status: She is alert. Mental status is at baseline.  Psychiatric:        Mood and Affect: Mood normal.      Consultants:  Oncology  Procedures:    Data Reviewed: No results found for this or any previous visit (from the past 24 hour(s)).  I have  Reviewed nursing notes, Vitals, and Lab results since pt's last encounter. Pertinent lab results : see above I have ordered test including BMP, CBC, Mg I have reviewed the last note from staff over past 24 hours I have discussed pt's care plan and test results with nursing staff, case manager   LOS: 1 day   Dwyane Dee, MD [facility] 01/21/2022, 4:32 PM

## 2022-01-21 NOTE — Evaluation (Signed)
Occupational Therapy Evaluation Patient Details Name: Daisy Oliver MRN: [id number] DOB: 08-08-1935 Today's Date: 01/21/2022   History of Present Illness Pt is a 86 yr old female admitted to the hospital with nausea, vomiting, and R sided weakness. She has a history of CNS lymphoma on chemo. A MRI of the brain revealed the following: 1. Marked interval increase in size of the homogenously contrast  enhancing right frontal lobe mass, as well as the degree of  surrounding T2/FLAIR hyperintense signal, which now extends along  the genu of the corpus callosum. There is mass effect the right  frontal lobe with approximate 6 mm leftward midline shift.  2. Other areas of contrast enhancement involving the right insular  cortex, frontal operculum, and corona radiata are redemonstrated,  but appear less conspicuous than on prior exam.   Clinical Impression   Patient was found seated in the bedside chair. She performed all assessed tasks without the need for assistance, including lower body dressing, sit to stand using a cane, and ambulating in her room using a cane. She also reported being able to manage her grooming and toileting needs this a.m. without the need for assistance. She appears to be at or very near her baseline level of functioning for self-care management & she is not presenting with functional deficits that warrant the need for further OT services. OT will sign off & recommend she return home at discharge.      Recommendations for follow up therapy are one component of a multi-disciplinary discharge planning process, led by the attending physician.  Recommendations may be updated based on patient status, additional functional criteria and insurance authorization.   Follow Up Recommendations  Other (comment) (Return home with resumption of outpatient therapy)    Assistance Recommended at Discharge PRN  Patient can return home with the following Assist for transportation     Functional Status Assessment  Patient has not had a recent decline in their functional status  Equipment Recommendations  None recommended by OT       Precautions / Restrictions Restrictions Weight Bearing Restrictions: No      Mobility      Transfers Overall transfer level: Modified independent Equipment used: Straight cane            Balance     Sitting balance-Leahy Scale: Good       Standing balance-Leahy Scale: Good             ADL either performed or assessed with clinical judgement   ADL   Eating/Feeding: Independent Eating/Feeding Details (indicate cue type and reason): based on clinical judgement Grooming: Modified independent;Standing           Upper Body Dressing : Independent   Lower Body Dressing: Independent Lower Body Dressing Details (indicate cue type and reason): for sock management seated at chair level Toilet Transfer: Modified Independent Toilet Transfer Details (indicate cue type and reason): based on clinical judgement                 Vision Baseline Vision/History: 1 Wears glasses Additional Comments: She correctly read the time depicted on the wall clock            Pertinent Vitals/Pain Pain Assessment Pain Assessment: 0-10 Pain Score: 4  Pain Location: R knee Pain Intervention(s): Limited activity within patient's tolerance     Hand Dominance Right   Extremity/Trunk Assessment Upper Extremity Assessment Upper Extremity Assessment: Overall WFL for tasks assessed   Lower Extremity Assessment Lower Extremity Assessment:  Overall WFL for tasks assessed       Communication Communication Communication: No difficulties   Cognition Arousal/Alertness: Awake/alert Behavior During Therapy: WFL for tasks assessed/performed Overall Cognitive Status: Within Functional Limits for tasks assessed            General Comments: oriented x4, able to follow commands without difficulty                Home  Living Family/patient expects to be discharged to:: Private residence Living Arrangements: Spouse/significant other Available Help at Discharge: Friend(s) Type of Home: House Home Access: Stairs to enter [employer] of Steps: 5 Entrance Stairs-Rails: Right Home Layout: Two level;Able to live on main level with bedroom/bathroom (There is a main level and basement level; the patient does go down into her basement to water her plants)     Bathroom Shower/Tub: Walk-in shower         Home Equipment: Kasandra Knudsen - single point          Prior Functioning/Environment Prior Level of Function : Independent/Modified Independent             Mobility Comments: uses cane intermittently for ambulation ADLs Comments: She was independent with ADLs, her and her spouse had hired assist for cleaning, she has not driven since starting chemo, and she was independent with cooking.                OT Goals(Current goals can be found in the care plan section) Acute Rehab OT Goals Patient Stated Goal: To return home soon OT Goal Formulation: With patient  OT Frequency:         AM-PAC OT "6 Clicks" Daily Activity     Outcome Measure Help from another person eating meals?: None Help from another person taking care of personal grooming?: None Help from another person toileting, which includes using toliet, bedpan, or urinal?: None Help from another person bathing (including washing, rinsing, drying)?: None Help from another person to put on and taking off regular upper body clothing?: None Help from another person to put on and taking off regular lower body clothing?: None 6 Click Score: 24   End of Session    Activity Tolerance: Patient tolerated treatment well Patient left: in chair;with call bell/phone within reach  OT Visit Diagnosis: Muscle weakness (generalized) (M62.81)                Time: 1007-1219 OT Time Calculation (min): 30 min Charges:  OT General Charges $OT Visit:  1 Visit OT Evaluation $OT Eval Low Complexity: 1 Low    Deneene Tarver L Mikkel Charrette, OTR/L 01/21/2022, 10:46 AM

## 2022-01-21 NOTE — Assessment & Plan Note (Signed)
-   Baseline hemoglobin around 10 to 11 g/dL - Currently at baseline

## 2022-01-21 NOTE — TOC Progression Note (Addendum)
Transition of Care [facility]) - Progression Note    Patient Details  Name: Daisy Oliver MRN: [id number] Date of Birth: Aug 01, 1935  Transition of Care [facility]) CM/SW Contact  Itzel Mckibbin, Juliann Pulse, RN Phone Number: 01/21/2022, 10:55 AM  Clinical Narrative:Noted Active w/care connections-[facility]. D/c plan home.  -3:37p-[name] states patient already goes to [facility] for otpt PT-no new referral needed.      Expected Discharge Plan: Home/Self Care Barriers to Discharge: Continued Medical Work up  Expected Discharge Plan and Services Expected Discharge Plan: Home/Self Care   Discharge Planning Services: CM Consult Post Acute Care Choice: NA Living arrangements for the past 2 months: Single Family Home                                       Social Determinants of Health (SDOH) Interventions    Readmission Risk Interventions    10/29/2021   10:49 AM  Readmission Risk Prevention Plan  Transportation Screening Complete  PCP or Specialist Appt within 5-7 Days Complete  Home Care Screening Complete  Medication Review (RN CM) Complete

## 2022-01-22 ENCOUNTER — Other Ambulatory Visit (HOSPITAL_COMMUNITY): Payer: Self-pay

## 2022-01-22 DIAGNOSIS — C8589 Other specified types of non-Hodgkin lymphoma, extranodal and solid organ sites: Secondary | ICD-10-CM | POA: Diagnosis not present

## 2022-01-22 DIAGNOSIS — G936 Cerebral edema: Secondary | ICD-10-CM | POA: Diagnosis not present

## 2022-01-22 MED ORDER — DEXAMETHASONE 4 MG PO TABS
4.0000 mg | ORAL_TABLET | Freq: Every day | ORAL | 0 refills | Status: DC
  Filled 2022-01-22: qty 14, 14d supply, fill #0

## 2022-01-22 NOTE — Discharge Summary (Signed)
Physician Discharge Summary   Daisy Oliver TZG:[id number] DOB: 12-17-1935 DOA: 01/19/2022  PCP: [name], MD  Admit date: 01/19/2022 Discharge date: 01/22/2022  Barriers to discharge: none  Admitted From: Home Disposition:  Home Discharging physician: Dwyane Dee, MD  Recommendations for Outpatient Follow-up:  Follow up with Dr. Mickeal Skinner  Home Health:  Equipment/Devices:   Discharge Condition: stable CODE STATUS: DNR Diet recommendation:  Diet Orders (From admission, onward)     Start     Ordered   01/20/22 0706  Diet gluten free Room service appropriate? Yes; Fluid consistency: Thin  Diet effective now       Question Answer Comment  Room service appropriate? Yes   Fluid consistency: Thin      01/20/22 0705            Hospital Course: Daisy Oliver is an 86 yo female with PMH acute intermittent porphyria, CNS lymphoma, GERD, HTN, hypothyroidism who presented with worsening right-sided weakness, nausea, and dizziness. She follows with Dr. Mickeal Skinner with oncology. MRI brain obtained on 01/20/2022 showed marked interval increase in right frontal lobe mass with mass effect and 6 mm leftward midline shift. She was initiated on steroids. She clinically improved with steroids and was considered stable for discharging home with follow up with oncology.   Assessment and Plan: * CNS lymphoma ([place]) - MRI brain obtained on 01/20/2022 shows marked interval increase in underlying right frontal lobe mass; there is mass effect on the right frontal lobe with 6 mm leftward midline shift -Patient seen by Dr. Mickeal Skinner during hospitalization, appreciate assistance - improved with IV decadron; de-escalated to PO decadron at discharge; discussed with Dr. Mickeal Skinner prior to discharge  Vasogenic edema [facility]) - continue decadron  Nausea & vomiting-resolved as of 01/22/2022 - Suspected due to progression of underlying lymphoma - Continue steroids - Nausea and vomiting have improved  since admission  Normocytic anemia - Baseline hemoglobin around 10 to 11 g/dL - Currently at baseline  Acquired hypothyroidism - Continue liothyronine  Essential hypertension - continue home regimen   The patient's chronic medical conditions were treated accordingly per the patient's home medication regimen except as noted.  On day of discharge, patient was felt deemed stable for discharge. Patient/family member advised to call PCP or come back to ER if needed.   Principal Diagnosis: CNS lymphoma [facility])  Discharge Diagnoses: Active Hospital Problems   Diagnosis Date Noted   CNS lymphoma ([place]) 01/20/2022    Priority: 1.   Vasogenic edema ([place]) 01/20/2022    Priority: 2.   Normocytic anemia 01/21/2022    Priority: 4.   Leukocytosis 01/20/2022   Acquired hypothyroidism 08/28/2014   Essential hypertension 08/25/2013    Resolved Hospital Problems   Diagnosis Date Noted Date Resolved   Nausea & vomiting 01/20/2022 01/22/2022    Priority: 3.     Discharge Instructions     Increase activity slowly   Complete by: As directed       Allergies as of 01/22/2022       Reactions   Barbiturates Other (See Comments)   Unknown reaction   Other Other (See Comments)   Patient has AIP and is not to have hormones or red wine   Pentothal [thiopental] Other (See Comments)   Unknown reaction   Sulfa Antibiotics Other (See Comments)   Unknown reaction        Medication List     TAKE these medications    acetaminophen 500 MG tablet Commonly known as: TYLENOL Take 500 mg  by mouth 2 (two) times daily as needed (pain).   BIOFREEZE EX Apply 1 Application topically at bedtime as needed (leg pain).   BIOTIN PO Take 1 tablet by mouth 2 (two) times daily.   dexamethasone 4 MG tablet Commonly known as: Decadron Take 1 tablet (4 mg total) by mouth daily for 14 days.   famotidine 20 MG tablet Commonly known as: PEPCID Take 20 mg by mouth at bedtime as needed for indigestion.    liothyronine 5 MCG tablet Commonly known as: CYTOMEL Take 2 tablets (10 mcg total) by mouth See admin instructions. Take 10 mcg before breakfast. What changed:  how much to take when to take this additional instructions   MAGNESIUM PO Take 1 capsule by mouth 2 (two) times daily.   Melatonin 10 MG Tabs Take 10 mg by mouth at bedtime.   nystatin cream Commonly known as: MYCOSTATIN Apply 1 Application topically as needed for dry skin (fever blisters).   olmesartan 20 MG tablet Commonly known as: BENICAR Take 1/2 tablet  Daily  for BP What changed:  how much to take how to take this when to take this   ondansetron 8 MG tablet Commonly known as: Zofran Take 1 tablet (8 mg total) by mouth every 8 (eight) hours as needed for nausea or vomiting. May take 30-60 minutes prior to Temodar administration if nausea/vomiting occurs as needed.   OSTEO BI-FLEX JOINT SHIELD PO Take 1 tablet by mouth 2 (two) times daily.   Systane 0.4-0.3 % Gel ophthalmic gel Generic drug: Polyethyl Glycol-Propyl Glycol Place 1 Application into both eyes at bedtime.   TAURINE PO Take 1 capsule by mouth daily.   temozolomide 140 MG capsule Commonly known as: TEMODAR Take 2 capsules (280 mg total) by mouth daily. Take for 5 days, then off 23 days. May take on an empty stomach to decrease nausea & vomiting.   tretinoin 0.05 % cream Commonly known as: RETIN-A Apply topically at bedtime. What changed:  how much to take when to take this additional instructions   VITAMIN C PO Take 1 tablet by mouth daily.   ZINC PO Take 1 tablet by mouth 2 (two) times daily.        Follow-up Information     [facility] Call.   Specialty: Orthopedic Surgery Why: resume outpatient physical therapy Contact information: [facility] [place] [address] [phone]                Allergies  Allergen Reactions   Barbiturates Other  (See Comments)    Unknown reaction   Other Other (See Comments)    Patient has AIP and is not to have hormones or red wine   Pentothal [Thiopental] Other (See Comments)    Unknown reaction   Sulfa Antibiotics Other (See Comments)    Unknown reaction    Consultations:   Procedures:   Discharge Exam: BP 118/71 (BP Location: Left Arm)   Pulse 64   Temp 98.8 F (37.1 C) (Oral)   Resp 16   Ht '5\' 4"'$  (1.626 m)   Wt 70.1 kg   SpO2 98%   BMI 26.53 kg/m  Physical Exam Constitutional:      Appearance: Normal appearance.  HENT:     Head: Normocephalic and atraumatic.     Mouth/Throat:     Mouth: Mucous membranes are moist.  Eyes:     Extraocular Movements: Extraocular movements intact.  Cardiovascular:     Rate  and Rhythm: Normal rate and regular rhythm.  Pulmonary:     Effort: Pulmonary effort is normal.     Breath sounds: Normal breath sounds.  Abdominal:     General: Bowel sounds are normal. There is no distension.     Palpations: Abdomen is soft.  Musculoskeletal:        General: Normal range of motion.     Cervical back: Normal range of motion and neck supple.  Skin:    General: Skin is warm.  Neurological:     Mental Status: She is alert. Mental status is at baseline.  Psychiatric:        Mood and Affect: Mood normal.      The results of significant diagnostics from this hospitalization (including imaging, microbiology, ancillary and laboratory) are listed below for reference.   Microbiology: No results found for this or any previous visit (from the past 240 hour(s)).   Labs: BNP (last 3 results) No results for input(s): "BNP" in the last 8760 hours. Basic Metabolic Panel: Recent Labs  Lab 01/19/22 2220 01/20/22 0541  NA 138 139  K 3.9 3.8  CL 102 106  CO2 26 26  GLUCOSE 115* 108*  BUN 19 17  CREATININE 1.12* 0.90  CALCIUM 9.6 9.3  MG  --  2.3   Liver Function Tests: Recent Labs  Lab 01/19/22 2220 01/20/22 0541  AST 38 29  ALT 21 19   ALKPHOS 46 42  BILITOT 0.9 0.9  PROT 7.3 6.6  ALBUMIN 4.0 3.4*   No results for input(s): "LIPASE", "AMYLASE" in the last 168 hours. No results for input(s): "AMMONIA" in the last 168 hours. CBC: Recent Labs  Lab 01/19/22 2220 01/20/22 0541  WBC 11.3* 6.4  NEUTROABS  --  5.6  HGB 12.0 11.0*  HCT 37.2 33.9*  MCV 100.0 99.4  PLT 249 239   Cardiac Enzymes: No results for input(s): "CKTOTAL", "CKMB", "CKMBINDEX", "TROPONINI" in the last 168 hours. BNP: Invalid input(s): "POCBNP" CBG: Recent Labs  Lab 01/19/22 2230  GLUCAP 109*   D-Dimer No results for input(s): "DDIMER" in the last 72 hours. Hgb A1c No results for input(s): "HGBA1C" in the last 72 hours. Lipid Profile No results for input(s): "CHOL", "HDL", "LDLCALC", "TRIG", "CHOLHDL", "LDLDIRECT" in the last 72 hours. Thyroid function studies Recent Labs    01/20/22 0621  TSH 0.493   Anemia work up No results for input(s): "VITAMINB12", "FOLATE", "FERRITIN", "TIBC", "IRON", "RETICCTPCT" in the last 72 hours. Urinalysis    Component Value Date/Time   COLORURINE YELLOW 01/19/2022 2220   APPEARANCEUR HAZY (A) 01/19/2022 2220   LABSPEC 1.008 01/19/2022 2220   PHURINE 7.0 01/19/2022 2220   GLUCOSEU NEGATIVE 01/19/2022 2220   HGBUR NEGATIVE 01/19/2022 2220   BILIRUBINUR NEGATIVE 01/19/2022 2220   KETONESUR 5 (A) 01/19/2022 2220   PROTEINUR NEGATIVE 01/19/2022 2220   UROBILINOGEN 0.2 08/28/2014 0958   NITRITE NEGATIVE 01/19/2022 2220   LEUKOCYTESUR TRACE (A) 01/19/2022 2220   Sepsis Labs Recent Labs  Lab 01/19/22 2220 01/20/22 0541  WBC 11.3* 6.4   Microbiology No results found for this or any previous visit (from the past 240 hour(s)).  Procedures/Studies: MR Brain W and Wo Contrast  Result Date: 01/20/2022 CLINICAL DATA:  History of lymphoma.  Some confusion. EXAM: MRI HEAD WITHOUT AND WITH CONTRAST TECHNIQUE: Multiplanar, multiecho pulse sequences of the brain and surrounding structures were  obtained without and with intravenous contrast. CONTRAST:  41m GADAVIST GADOBUTROL 1 MMOL/ML IV SOLN COMPARISON:  MRI 08/28/2021  brain 12/20/2018 FINDINGS: Brain: Compared to prior exam there is marked interval increase in size of the homogeneously contrast-enhancing mass in the right frontal lobe, now measuring to 3.2 x 3.3 cm most recently 1.3 x 1.5 cm. There is marked interval increase in the degree of T2/FLAIR hyperintense signal surrounding this lesion, which now extends along the genu of the corpus callosum (series 8, image 15) and inferiorly to the gyrus rectus. There is mass effect the right frontal lobe with approximate 6 mm midline shift. There is a small amount of hemorrhage along the superior aspect of this mass (series 10, image 45), which may represent changes related to prior biopsy, and is unchanged. Other areas of contrast enhancement involving the right insular cortex, frontal operculum, and the corona radiata are redemonstrated, but appear less conspicuous than on prior exam. No new infarct, hydrocephalus, or extar axial fluid collection. Vascular: Normal flow voids. Skull and upper cervical spine: Normal marrow signal. Sinuses/Orbits: Bilateral lens replacement. Trace left mastoid effusion. Other: None. IMPRESSION: 1. Marked interval increase in size of the homogenously contrast enhancing right frontal lobe mass, as well as the degree of surrounding T2/FLAIR hyperintense signal, which now extends along the genu of the corpus callosum. There is mass effect the right frontal lobe with approximate 6 mm leftward midline shift. 2. Other areas of contrast enhancement involving the right insular cortex, frontal operculum, and corona radiata are redemonstrated, but appear less conspicuous than on prior exam. Electronically Signed   By: Marin Roberts M.D.   On: 01/20/2022 09:05   CT Head Wo Contrast  Result Date: 01/20/2022 CLINICAL DATA:  Brain/CNS neoplasm to assess treatment response. Lymphoma  with nausea and vomiting. EXAM: CT HEAD WITHOUT CONTRAST TECHNIQUE: Contiguous axial images were obtained from the base of the skull through the vertex without intravenous contrast. RADIATION DOSE REDUCTION: This exam was performed according to the departmental dose-optimization program which includes automated exposure control, adjustment of the mA and/or kV according to patient size and/or use of iterative reconstruction technique. COMPARISON:  MRI 12/19/2021.  CT 09/03/2021 FINDINGS: Brain: Intra-axial mass lesion in the right frontal lobe measuring 2.8 x 3.2 cm in diameter. Correlation is limited due to difference in modality but the mass appears larger than on the previous MRI and CT studies. There is surrounding vasogenic edema similar to previous studies. Right to left midline shift of about 7 mm. Mass effect is increased since the previous MR. Mild effacement of the anterior horn of the right lateral ventricle. No acute hemorrhage. No new lesions are identified although MRI would be more sensitive for detection of intracranial lesions. Vascular: No hyperdense vessel or unexpected calcification. Skull: Normal. Negative for fracture or focal lesion. Sinuses/Orbits: No acute finding. Other: None. IMPRESSION: 2.8 x 3.2 cm right frontal parenchymal mass with surrounding vasogenic edema. Right to left midline shift of about 7 mm. There appears to be progression in size of the lesion and in mass effect since the prior MRI. Electronically Signed   By: Lucienne Capers M.D.   On: 01/20/2022 01:04     Time coordinating discharge: Over 30 minutes    Dwyane Dee, MD  [facility] 01/22/2022, 3:55 PM

## 2022-01-22 NOTE — Progress Notes (Signed)
Mobility Specialist - Progress Note   01/22/22 1101  Mobility  Activity Ambulated with assistance in hallway  Level of Assistance Standby assist, set-up cues, supervision of patient - no hands on  Assistive Device Cane  Distance Ambulated (ft) 500 ft  Range of Motion/Exercises Active  Activity Response Tolerated well  Mobility Referral Yes  $Mobility charge 1 Mobility   Pt received in bed and agreeable to mobility. No complaints during ambulation. Assisted pt w/ bathroom ADLs. Pt EOB at EOS with all needs met & call bell in reach.    [facility]

## 2022-01-23 ENCOUNTER — Other Ambulatory Visit: Payer: Self-pay | Admitting: Internal Medicine

## 2022-01-23 ENCOUNTER — Other Ambulatory Visit (HOSPITAL_COMMUNITY): Payer: Self-pay

## 2022-01-23 ENCOUNTER — Encounter: Payer: Self-pay | Admitting: Internal Medicine

## 2022-01-26 ENCOUNTER — Other Ambulatory Visit (HOSPITAL_COMMUNITY): Payer: Self-pay

## 2022-01-26 ENCOUNTER — Inpatient Hospital Stay: Payer: Medicare PPO

## 2022-01-26 ENCOUNTER — Inpatient Hospital Stay: Payer: Medicare PPO | Attending: Neurosurgery

## 2022-01-26 ENCOUNTER — Inpatient Hospital Stay (HOSPITAL_BASED_OUTPATIENT_CLINIC_OR_DEPARTMENT_OTHER): Payer: Medicare PPO | Admitting: Internal Medicine

## 2022-01-26 VITALS — BP 119/65 | HR 52 | Temp 97.8°F | Resp 18 | Wt 152.2 lb

## 2022-01-26 DIAGNOSIS — Z8269 Family history of other diseases of the musculoskeletal system and connective tissue: Secondary | ICD-10-CM | POA: Insufficient documentation

## 2022-01-26 DIAGNOSIS — M25569 Pain in unspecified knee: Secondary | ICD-10-CM | POA: Insufficient documentation

## 2022-01-26 DIAGNOSIS — M545 Low back pain, unspecified: Secondary | ICD-10-CM | POA: Insufficient documentation

## 2022-01-26 DIAGNOSIS — Z882 Allergy status to sulfonamides status: Secondary | ICD-10-CM | POA: Insufficient documentation

## 2022-01-26 DIAGNOSIS — R112 Nausea with vomiting, unspecified: Secondary | ICD-10-CM | POA: Diagnosis not present

## 2022-01-26 DIAGNOSIS — Z8262 Family history of osteoporosis: Secondary | ICD-10-CM | POA: Insufficient documentation

## 2022-01-26 DIAGNOSIS — R2681 Unsteadiness on feet: Secondary | ICD-10-CM | POA: Insufficient documentation

## 2022-01-26 DIAGNOSIS — Z8261 Family history of arthritis: Secondary | ICD-10-CM | POA: Diagnosis not present

## 2022-01-26 DIAGNOSIS — Z79630 Long term (current) use of alkylating agent: Secondary | ICD-10-CM | POA: Insufficient documentation

## 2022-01-26 DIAGNOSIS — C8589 Other specified types of non-Hodgkin lymphoma, extranodal and solid organ sites: Secondary | ICD-10-CM

## 2022-01-26 DIAGNOSIS — Z809 Family history of malignant neoplasm, unspecified: Secondary | ICD-10-CM | POA: Diagnosis not present

## 2022-01-26 DIAGNOSIS — Z79899 Other long term (current) drug therapy: Secondary | ICD-10-CM | POA: Insufficient documentation

## 2022-01-26 DIAGNOSIS — Z79890 Hormone replacement therapy (postmenopausal): Secondary | ICD-10-CM | POA: Diagnosis not present

## 2022-01-26 DIAGNOSIS — Z7952 Long term (current) use of systemic steroids: Secondary | ICD-10-CM | POA: Insufficient documentation

## 2022-01-26 DIAGNOSIS — Z818 Family history of other mental and behavioral disorders: Secondary | ICD-10-CM | POA: Diagnosis not present

## 2022-01-26 DIAGNOSIS — Z8249 Family history of ischemic heart disease and other diseases of the circulatory system: Secondary | ICD-10-CM | POA: Diagnosis not present

## 2022-01-26 DIAGNOSIS — R42 Dizziness and giddiness: Secondary | ICD-10-CM | POA: Insufficient documentation

## 2022-01-26 DIAGNOSIS — Z8616 Personal history of COVID-19: Secondary | ICD-10-CM | POA: Diagnosis not present

## 2022-01-26 DIAGNOSIS — C8339 Diffuse large B-cell lymphoma, extranodal and solid organ sites: Secondary | ICD-10-CM | POA: Diagnosis not present

## 2022-01-26 DIAGNOSIS — Z808 Family history of malignant neoplasm of other organs or systems: Secondary | ICD-10-CM | POA: Diagnosis not present

## 2022-01-26 DIAGNOSIS — Z5112 Encounter for antineoplastic immunotherapy: Secondary | ICD-10-CM | POA: Diagnosis present

## 2022-01-26 DIAGNOSIS — E039 Hypothyroidism, unspecified: Secondary | ICD-10-CM | POA: Diagnosis not present

## 2022-01-26 DIAGNOSIS — R2689 Other abnormalities of gait and mobility: Secondary | ICD-10-CM | POA: Insufficient documentation

## 2022-01-26 DIAGNOSIS — I10 Essential (primary) hypertension: Secondary | ICD-10-CM | POA: Insufficient documentation

## 2022-01-26 LAB — CBC WITH DIFFERENTIAL (CANCER CENTER ONLY)
Abs Immature Granulocytes: 0.07 10*3/uL (ref 0.00–0.07)
Basophils Absolute: 0 10*3/uL (ref 0.0–0.1)
Basophils Relative: 0 %
Eosinophils Absolute: 0.3 10*3/uL (ref 0.0–0.5)
Eosinophils Relative: 3 %
HCT: 37.6 % (ref 36.0–46.0)
Hemoglobin: 12.7 g/dL (ref 12.0–15.0)
Immature Granulocytes: 1 %
Lymphocytes Relative: 22 %
Lymphs Abs: 2.3 10*3/uL (ref 0.7–4.0)
MCH: 32.5 pg (ref 26.0–34.0)
MCHC: 33.8 g/dL (ref 30.0–36.0)
MCV: 96.2 fL (ref 80.0–100.0)
Monocytes Absolute: 1 10*3/uL (ref 0.1–1.0)
Monocytes Relative: 9 %
Neutro Abs: 6.5 10*3/uL (ref 1.7–7.7)
Neutrophils Relative %: 65 %
Platelet Count: 207 10*3/uL (ref 150–400)
RBC: 3.91 MIL/uL (ref 3.87–5.11)
RDW: 13.8 % (ref 11.5–15.5)
WBC Count: 10.2 10*3/uL (ref 4.0–10.5)
nRBC: 0 % (ref 0.0–0.2)

## 2022-01-26 LAB — CMP (CANCER CENTER ONLY)
ALT: 23 U/L (ref 0–44)
AST: 23 U/L (ref 15–41)
Albumin: 3.7 g/dL (ref 3.5–5.0)
Alkaline Phosphatase: 46 U/L (ref 38–126)
Anion gap: 4 — ABNORMAL LOW (ref 5–15)
BUN: 18 mg/dL (ref 8–23)
CO2: 33 mmol/L — ABNORMAL HIGH (ref 22–32)
Calcium: 9.2 mg/dL (ref 8.9–10.3)
Chloride: 101 mmol/L (ref 98–111)
Creatinine: 0.84 mg/dL (ref 0.44–1.00)
GFR, Estimated: >60 mL/min (ref >60)
Glucose, Bld: 98 mg/dL (ref 70–99)
Potassium: 3.5 mmol/L (ref 3.5–5.1)
Sodium: 138 mmol/L (ref 135–145)
Total Bilirubin: 0.6 mg/dL (ref 0.3–1.2)
Total Protein: 6.7 g/dL (ref 6.5–8.1)

## 2022-01-26 MED ORDER — DEXAMETHASONE 2 MG PO TABS: 2.0000 mg | ORAL_TABLET | Freq: Every day | ORAL | 0 refills | Status: DC

## 2022-01-26 NOTE — Progress Notes (Signed)
[facility] [place], [place] [address] [phone]   Interval Evaluation  Date of Service: 01/26/22 Patient Name: Daisy Oliver Patient MRN: [id number] Patient DOB: Apr 17, 1935 Provider: [name], MD  Identifying Statement:  Daisy Oliver is a 86 y.o. female with right frontal  CNS lymphoma    Oncologic History: Oncology History  Primary CNS lymphoma ([place])  09/02/2021 Surgery   Stereotactic biopsy by Dr. Marcello Moores; path is B-cell lymphoma CD20+   09/30/2021 - 12/05/2021 Chemotherapy   Patient is on Treatment Plan : IP NON-HODGKINS LYMPHOMA High Dose Methotrexate + Leucovorin Rescue     10/02/2021 -  Chemotherapy   Patient is on Treatment Plan : NON-HODGKINS LYMPHOMA Rituximab q21d     01/01/2022 -  Chemotherapy   Patient is on Treatment Plan : BRAIN GLIOBLASTOMA Consolidation Temozolomide Days 1-5 q28 Days        Biomarkers:  CD20 positive .  Ki-67 50% .   Unknown   Unknown   Interval History: Daisy Oliver presents today for follow up after recent hospitalization.  She is feeling back to baseline overall after her nausea/vomiting, dizziness period.  Currently independent with gait and functional status.  She remains on decadron 57m daily.   H+P (09/16/21) Patient presented to medical attention in May 2023 with several weeks of progressive gait instability.  She describes feeling unsteady on her feet, even falling "a couple of times".  After one of the falls, ED visit led to CNS imaging which demonstrated multifocal masses in the right hemisphere.  She underwent stereotactic biopsy with Dr. TMarcello Mooreson 09/02/21; path demonstrated diffuse large B-cell lymphoma, CD20+.  Since surgery, she has tapered off the steroids without issue.  She feels "fairly normal" aside from pain in her knee and lower back.  She uses a cane to walk, mainly because of the knee pain.  No issues with frank weakness, no seizures.  She  lives at home with her husband who has dementia.  [place]  Medications: Current Outpatient Medications on File Prior to Visit  Medication Sig Dispense Refill   acetaminophen (TYLENOL) 500 MG tablet Take 500 mg by mouth 2 (two) times daily as needed (pain).     Ascorbic Acid (VITAMIN C PO) Take 1 tablet by mouth daily.     BIOTIN PO Take 1 tablet by mouth 2 (two) times daily.     dexamethasone (DECADRON) 4 MG tablet Take 1 tablet (4 mg total) by mouth daily for 14 days. 14 tablet 0   famotidine (PEPCID) 20 MG tablet Take 20 mg by mouth at bedtime as needed for indigestion.     liothyronine (CYTOMEL) 5 MCG tablet Take 2 tablets (10 mcg total) by mouth See admin instructions. Take 10 mcg before breakfast. (Patient taking differently: Take 5 mcg by mouth in the morning and at bedtime.) 180 tablet 3   MAGNESIUM PO Take 1 capsule by mouth 2 (two) times daily.     Melatonin 10 MG TABS Take 10 mg by mouth at bedtime.     Menthol, Topical Analgesic, (BIOFREEZE EX) Apply 1 Application topically at bedtime as needed (leg pain).     Misc Natural Products (OSTEO BI-FLEX JOINT SHIELD PO) Take 1 tablet by mouth 2 (two) times daily.     Multiple Vitamins-Minerals (ZINC PO) Take 1 tablet by mouth 2 (two) times daily.     nystatin cream (MYCOSTATIN) Apply 1 Application topically as needed  for dry skin (fever blisters).     olmesartan (BENICAR) 20 MG tablet Take 0.5 tablets (10 mg total) by mouth daily. Take 1/2 tablet  Daily  for BP 45 tablet 3   ondansetron (ZOFRAN) 8 MG tablet Take 1 tablet (8 mg total) by mouth every 8 (eight) hours as needed for nausea or vomiting. May take 30-60 minutes prior to Temodar administration if nausea/vomiting occurs as needed. 30 tablet 1   Polyethyl Glycol-Propyl Glycol (SYSTANE) 0.4-0.3 % GEL ophthalmic gel Place 1 Application into both eyes at bedtime.     TAURINE PO Take 1 capsule by mouth daily.     temozolomide (TEMODAR) 140 MG capsule Take 2  capsules (280 mg total) by mouth daily. Take for 5 days, then off 23 days. May take on an empty stomach to decrease nausea & vomiting. 10 capsule 0   tretinoin (RETIN-A) 0.05 % cream Apply topically at bedtime. (Patient taking differently: Apply 1 application  topically See admin instructions. 1 application at bedtime on Monday, Wednesday, Friday) 45 g 3   No current facility-administered medications on file prior to visit.    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[place])    diagnosed at age 27   Cancer [facility])    CNS lymphoma ([place])    COVID-19 08/26/2020   Fracture of radial head, right, closed 09/16/2012   GERD (gastroesophageal reflux disease)    pepcid prn   Hypertension    Hypothyroidism    PONV (postoperative nausea and vomiting)    Past Surgical History:  Past Surgical History:  Procedure Laterality Date   APPLICATION OF CRANIAL NAVIGATION Right 09/02/2021   Procedure: APPLICATION OF CRANIAL NAVIGATION;  Surgeon: Vallarie Mare, MD;  Location: [facility];  Service: Neurosurgery;  Laterality: Right;   CATARACT EXTRACTION, BILATERAL  2020   Dr. Herbert Deaner   FRAMELESS  BIOPSY WITH BRAINLAB Right 09/02/2021   Procedure: FRONTAL STEREOTACTIC BRAIN BIOPSY;  Surgeon: Vallarie Mare, MD;  Location: [facility];  Service: Neurosurgery;  Laterality: Right;   IR IMAGING GUIDED PORT INSERTION  09/24/2021   RADIAL HEAD ARTHROPLASTY Right 09/16/2012   Procedure: RADIAL HEAD ARTHROPLASTY;  Surgeon: Johnny Bridge, MD;  Location: [facility];  Service: Orthopedics;  Laterality: Right;  RADIAL HEAD REPLACEMENT      Social History:  Social History   Socioeconomic History   Marital status: Married    Spouse name: Not on file   Number of children: 2   Years of education: Not on file   Highest education level: Not on file  Occupational History   Not on file  Tobacco Use   Smoking status: Former    Packs/day: 2.00    Years: 22.00    Total pack years: 44.00    Types: Cigarettes    Quit date: 09/17/1983    Years since quitting: 38.3   Smokeless tobacco: Never  Vaping Use   Vaping Use: Never used  Substance and Sexual Activity   Alcohol use: Not Currently    Alcohol/week: 0.0 - 1.0 standard drinks of alcohol  Comment: 2/month   Drug use: No   Sexual activity: Not Currently  Other Topics Concern   Not on file  Social History Narrative   Not on file   Social Determinants of Health   Financial Resource Strain: Not on file  Food Insecurity: No Food Insecurity (01/20/2022)   Hunger Vital Sign    Worried About Running Out of Food in the Last Year: Never true    Ran Out of Food in the Last Year: Never true  Transportation Needs: No Transportation Needs (01/20/2022)   PRAPARE - Hydrologist (Medical): No    Lack of Transportation (Non-Medical): No  Physical Activity: Sufficiently Active (09/14/2017)   Exercise Vital Sign    Days of Exercise per Week: 5 days    Minutes of Exercise per Session: 60 min  Stress: No Stress Concern Present (09/14/2017)   [place]    Feeling of Stress : Only a little  Social Connections: Not on file  Intimate Partner Violence: Not At Risk (01/20/2022)   Humiliation, Afraid, Rape, and Kick questionnaire    Fear of Current or Ex-Partner: No    Emotionally Abused: No    Physically Abused: No    Sexually Abused: No   Family History:  Family History  Problem Relation Age of Onset   Heart disease Mother    Brain cancer Father 72       Brain tumor, not cancerous    Hypertension Sister    Osteoporosis Sister    Arthritis Sister    Scoliosis Sister    Hypertension Sister    Osteoporosis Sister    Cancer Paternal Grandfather    Suicidality Paternal Grandfather    Breast cancer Neg Hx     Review of Systems: Constitutional: Doesn't report fevers, chills or abnormal weight loss Eyes: Doesn't report blurriness of vision Ears, nose, mouth, throat, and face: Doesn't report sore throat Respiratory: Doesn't report cough, dyspnea or wheezes Cardiovascular: Doesn't report palpitation, chest discomfort  Gastrointestinal:  Doesn't report nausea, constipation, diarrhea GU: Doesn't report incontinence Skin: Doesn't report skin rashes Neurological: Per HPI Musculoskeletal: Doesn't report joint pain Behavioral/Psych: Doesn't report anxiety  Physical Exam: There were no vitals filed for this visit.  KPS: 80. General: Alert, cooperative, pleasant, in no acute distress Head: Normal EENT: No conjunctival injection or scleral icterus.  Lungs: Resp effort normal Cardiac: Regular rate Abdomen: Non-distended abdomen Skin: No rashes cyanosis or petechiae. Extremities: No clubbing or edema  Neurologic Exam: Mental Status: Awake, alert, attentive to examiner. Oriented to self and environment. Language is fluent with intact comprehension.  Cranial Nerves: Visual acuity is grossly normal. Visual fields are full. Extra-ocular movements intact. No ptosis. Face is symmetric Motor: Tone and bulk are normal. Power is full in both arms and legs. Reflexes are symmetric, no pathologic reflexes present.  Sensory: Intact to light touch Gait: Orthopedic limitation, cane assisted   Labs: I have reviewed the data as listed    Component Value Date/Time   NA 139 01/20/2022 0541   K 3.8 01/20/2022 0541   CL 106 01/20/2022 0541   CO2 26 01/20/2022 0541   GLUCOSE 108 (H) 01/20/2022 0541   BUN 17 01/20/2022 0541   CREATININE 0.90 01/20/2022 0541   CREATININE 1.07 (H)  01/01/2022 0852   CREATININE 0.85 11/05/2021 1619   CALCIUM 9.3 01/20/2022 0541   PROT 6.6 01/20/2022 0541   ALBUMIN 3.4 (L) 01/20/2022 0541   AST  29 01/20/2022 0541   AST 25 01/01/2022 0852   ALT 19 01/20/2022 0541   ALT 15 01/01/2022 0852   ALKPHOS 42 01/20/2022 0541   BILITOT 0.9 01/20/2022 0541   BILITOT 0.6 01/01/2022 0852   GFRNONAA >60 01/20/2022 0541   GFRNONAA 51 (L) 01/01/2022 0852   GFRNONAA 66 09/19/2020 0000   GFRAA 76 09/19/2020 0000   Lab Results  Component Value Date   WBC 10.2 01/26/2022   NEUTROABS 6.5 01/26/2022   HGB 12.7 01/26/2022   HCT 37.6 01/26/2022   MCV 96.2 01/26/2022   PLT 207 01/26/2022    Imaging:  [facility] Clinician Interpretation: I have personally reviewed the CNS images as listed.  My interpretation, in the context of the patient's clinical presentation, is progressive disease  MR Brain W and Wo Contrast  Result Date: 01/20/2022 CLINICAL DATA:  History of lymphoma.  Some confusion. EXAM: MRI HEAD WITHOUT AND WITH CONTRAST TECHNIQUE: Multiplanar, multiecho pulse sequences of the brain and surrounding structures were obtained without and with intravenous contrast. CONTRAST:  42m GADAVIST GADOBUTROL 1 MMOL/ML IV SOLN COMPARISON:  MRI 08/28/2021 brain 12/20/2018 FINDINGS: Brain: Compared to prior exam there is marked interval increase in size of the homogeneously contrast-enhancing mass in the right frontal lobe, now measuring to 3.2 x 3.3 cm most recently 1.3 x 1.5 cm. There is marked interval increase in the degree of T2/FLAIR hyperintense signal surrounding this lesion, which now extends along the genu of the corpus callosum (series 8, image 15) and inferiorly to the gyrus rectus. There is mass effect the right frontal lobe with approximate 6 mm midline shift. There is a small amount of hemorrhage along the superior aspect of this mass (series 10, image 45), which may represent changes related to prior biopsy, and is unchanged. Other areas of contrast  enhancement involving the right insular cortex, frontal operculum, and the corona radiata are redemonstrated, but appear less conspicuous than on prior exam. No new infarct, hydrocephalus, or extar axial fluid collection. Vascular: Normal flow voids. Skull and upper cervical spine: Normal marrow signal. Sinuses/Orbits: Bilateral lens replacement. Trace left mastoid effusion. Other: None. IMPRESSION: 1. Marked interval increase in size of the homogenously contrast enhancing right frontal lobe mass, as well as the degree of surrounding T2/FLAIR hyperintense signal, which now extends along the genu of the corpus callosum. There is mass effect the right frontal lobe with approximate 6 mm leftward midline shift. 2. Other areas of contrast enhancement involving the right insular cortex, frontal operculum, and corona radiata are redemonstrated, but appear less conspicuous than on prior exam. Electronically Signed   By: HMarin RobertsM.D.   On: 01/20/2022 09:05   CT Head Wo Contrast  Result Date: 01/20/2022 CLINICAL DATA:  Brain/CNS neoplasm to assess treatment response. Lymphoma with nausea and vomiting. EXAM: CT HEAD WITHOUT CONTRAST TECHNIQUE: Contiguous axial images were obtained from the base of the skull through the vertex without intravenous contrast. RADIATION DOSE REDUCTION: This exam was performed according to the departmental dose-optimization program which includes automated exposure control, adjustment of the mA and/or kV according to patient size and/or use of iterative reconstruction technique. COMPARISON:  MRI 12/19/2021.  CT 09/03/2021 FINDINGS: Brain: Intra-axial mass lesion in the right frontal lobe measuring 2.8 x 3.2 cm in diameter. Correlation is limited due to difference in modality but the mass appears larger than on the previous MRI and CT studies. There is surrounding vasogenic edema similar to previous studies. Right to left midline shift of about 7 mm.  Mass effect is increased since the  previous MR. Mild effacement of the anterior horn of the right lateral ventricle. No acute hemorrhage. No new lesions are identified although MRI would be more sensitive for detection of intracranial lesions. Vascular: No hyperdense vessel or unexpected calcification. Skull: Normal. Negative for fracture or focal lesion. Sinuses/Orbits: No acute finding. Other: None. IMPRESSION: 2.8 x 3.2 cm right frontal parenchymal mass with surrounding vasogenic edema. Right to left midline shift of about 7 mm. There appears to be progression in size of the lesion and in mass effect since the prior MRI. Electronically Signed   By: Lucienne Capers M.D.   On: 01/20/2022 01:04      Assessment/Plan Primary CNS lymphoma ([place])  Remonia Richter Empie is clinically stable today following recent admission.  MRI brain demonstrates clear progression of disease within R anterior frontal lobe component, with doubling of enhancing volume in just over 1 month.  The deeper, previously more aggressive and symptomatic portion of the right hemispheric tumor remains with very good response to methotrexate.  This is representative of progression of Primary CNS Lymphoma.  The progressive changes are not symptomatic at this time.  The temodar/rituximab has not been effective and will be discontinued.    We discussed goals of care and other potential treatment pathways, including resumption of methotrexate, radiation, hospice referral.  They would like to proceed with whole brain radiation therapy at this time.   Consult and Ct/sim has been arranged with Dr. Isidore Moos tomorrow.  Dose and fractionation are TBD at this time.   We recommended decreasing decadron to 16m daily if tolerated.   We ask that LJosephina Gipreturn to clinic in 6 weeks with MRI brain for evaluation following WBRT.  All questions were answered. The patient knows to call the clinic with any problems, questions or concerns. No barriers to learning were  detected.  The total time spent in the encounter was 40 minutes and more than 50% was on counseling and review of test results   ZVentura Sellers MD Medical Director of Neuro-Oncology [facility] [place]10/16/23 11:11 AM

## 2022-01-26 NOTE — Progress Notes (Signed)
Radiation Oncology         [phone] ________________________________  Initial Outpatient Consultation  Name: Daisy Oliver MRN: [id number]  Date: 01/27/2022  DOB: 1935-08-19  CC:Daisy Pinto, MD  Daisy Sellers, MD   REFERRING PHYSICIAN: [name], MD  DIAGNOSIS: No diagnosis found.  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  CHIEF COMPLAINT: Here to discuss management of brain cancer  HISTORY OF PRESENT ILLNESS::Daisy Oliver is a 86 y.o. female who presented with a several week history in imbalance and left sided weakness to her PCP on 08/27/21. CT of the head performed that day revealed extensive vasogenic edema in the right frontal, parietal and temporal lobes with probable mass in the right basal ganglia and overlying frontal lobe, concerning for malignancy. CT also showed resulting mass effect with an approximately 8 mm of leftward midline shift and effacement the right lateral ventricle.   Given CT findings, the patient was urgently instructed to present to the ED that day for overnight admission and further evaluation and management. MRI of the brain performed while inpatient on 08/27/21 showed the large infiltrating mass in the right frontal and temporal lobe extending back towards the parietal lobe as most consistent with primary CNS neoplasm. Areas of non-enhancing edema were also redemonstrated, as well as nodular areas of enhancement. The epicenter of the mass again appeared to be in the right basal ganglia, resulting in a 8 mm midline shift to the left. CT of the chest abdomen and pelvis performed showed no evidence of metastatic disease in the chest, abdomen, or pelvis.   MRI of the brain taken on 08/28/21 showed no change of the mass in the right basal ganglia. The satellite lesion in the anterior right frontal lobe also appeared to be unchanged  in size.   Following discussion with neurosurgery while inpatient, the patient opted to pursue biopsies. (The patient was also started on decadron while admitted but had to stop prior to undergoing biopsies).   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 (Patient was admitted overnight for observation).   CT of the head on 09/03/21 redemonstrated multiple masses, the largest of which located in the right basal ganglia. Similar extensive surrounding vasogenic edema was also appreciated, as well as a very slight increase in mass effect resulting in 9 mm of leftward midline shift. CT also showed postsurgical changes pertaining to the right frontal biopsy, with a small volume of intraparenchymal and extra-axial hemorrhage and gas noted in the region.  Accordingly, the patient was referred to Dr. Mickeal Skinner on 09/16/21 to discuss treatment options. Following discussion of the risks and benefits, the patient agreed to proceed with chemotherapy consisting of methotrexate and rituximab. Her infusions were given in an inpatient setting requiring a week long admission each cycle. She received her first cycle on 09/29/21. The patient also received Rituximab infusions while inpatient, typically administered 2 days after her methotrexate.   - PET scan on 09/22/21 prior to initiating treatment showed: focal hypermetabolic activity in the right frontal lobe and right basal ganglia compatible with known CNS lesions; a new hypermetabolic left lower lobe pulmonary nodule (potentially infectious in etiology);  and a mildly hypermetabolic subcentimeter right inguinal lymph node, (potentially reactive in etiology).   -- MRI of the whole spine also performed prior to treatment on 09/25/21 showed no evidence of osseous  metastatic disease.    MRI of the brain on 12/19/21 showed regression of CNS lymphoma since imaging in May, and resolution of intracranial mass  effect. MRI also showed residual tumor in the right frontal lobe, and a new nonspecific T2/FLAIR heterogeneity in the pons, but no other signal changes or mass effect to strongly suggest treated or active tumor.  Recently, the patient presented to the ED on 01/19/22 with worsening right-sided weakness, nausea, and dizziness. MRI of the brain while inpatient on 01/20/22 showed a marked interval increase in right frontal lobe mass with mass effect and 6 mm leftward midline shift. The deeper, previously more aggressive and symptomatic portion of the right hemispheric tumor however showed a very good response to methotrexate.  CT of the head also performed showed the progression of the right frontal parenchymal mass measuring 2.8 x 3.2 cm, with surrounding vasogenic edema and a right to left midline shift of approximately 7 mm. She was promptly admitted and initiated on steroids with clinical improvement. She was also given IV decadron and de-escalated to PO decadron prior to discharge. The patient was seen by Dr. Mickeal Skinner while inpatient who recommended discontinuing temodar/rituximab given clear progression of disease.   Following discussion with Dr. Mickeal Skinner yesterday, the patient voiced interest in proceeding with whole brain radiation therapy. She is here today to discuss this in detail.   Dr. Mickeal Skinner has also decreased her decadron from 4 mg to 2 mg daily as tolerated.    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    PAST SURGICAL HISTORY: Past Surgical History:  Procedure Laterality Date   APPLICATION OF CRANIAL NAVIGATION Right 09/02/2021   Procedure: APPLICATION OF CRANIAL NAVIGATION;  Surgeon: Vallarie Mare, MD;  Location: [facility];  Service: Neurosurgery;  Laterality: Right;   CATARACT EXTRACTION, BILATERAL  2020   Dr. Herbert Deaner   FRAMELESS  BIOPSY WITH BRAINLAB Right 09/02/2021   Procedure: FRONTAL STEREOTACTIC BRAIN BIOPSY;  Surgeon: Vallarie Mare, MD;  Location: [facility];  Service: Neurosurgery;  Laterality: Right;   IR IMAGING GUIDED PORT INSERTION  09/24/2021   RADIAL HEAD ARTHROPLASTY Right 09/16/2012   Procedure: RADIAL HEAD ARTHROPLASTY;  Surgeon: Johnny Bridge, MD;  Location: [facility];  Service: Orthopedics;  Laterality: Right;  RADIAL HEAD REPLACEMENT      FAMILY HISTORY: family history includes Arthritis in her sister; Brain cancer (age of onset: 28) in her father; Cancer in her paternal grandfather; Heart disease in her mother; Hypertension in her sister and sister; Osteoporosis in her sister and sister; Scoliosis in her sister; Suicidality in her paternal grandfather.  SOCIAL HISTORY:  reports that she quit smoking about 38 years ago. Her smoking use included cigarettes. She has a 44.00 pack-year smoking history. She has never used smokeless tobacco. She reports that she does not currently use alcohol. She reports that she does not use drugs.  ALLERGIES: Barbiturates, Other, Pentothal [thiopental], and Sulfa antibiotics  MEDICATIONS:  Current Outpatient Medications  Medication Sig Dispense Refill   acetaminophen (TYLENOL) 500 MG tablet Take 500 mg by mouth 2 (two) times daily as needed (pain).     Ascorbic Acid (VITAMIN C PO) Take 1 tablet by mouth daily.     BIOTIN PO Take 1 tablet by mouth 2 (two)  times daily.     dexamethasone (DECADRON) 2 MG tablet Take 1 tablet (2 mg total) by mouth daily. 60 tablet 0   famotidine (PEPCID) 20 MG tablet Take 20 mg by mouth at bedtime as needed for indigestion.     liothyronine (CYTOMEL) 5 MCG tablet Take 2 tablets (10 mcg total) by mouth See admin instructions. Take 10 mcg before breakfast. (Patient taking differently:  Take 5 mcg by mouth in the morning and at bedtime.) 180 tablet 3   MAGNESIUM PO Take 1 capsule by mouth 2 (two) times daily.     Melatonin 10 MG TABS Take 10 mg by mouth at bedtime.     Menthol, Topical Analgesic, (BIOFREEZE EX) Apply 1 Application topically at bedtime as needed (leg pain).     Misc Natural Products (OSTEO BI-FLEX JOINT SHIELD PO) Take 1 tablet by mouth 2 (two) times daily.     Multiple Vitamins-Minerals (ZINC PO) Take 1 tablet by mouth 2 (two) times daily.     nystatin cream (MYCOSTATIN) Apply 1 Application topically as needed for dry skin (fever blisters).     olmesartan (BENICAR) 20 MG tablet Take 0.5 tablets (10 mg total) by mouth daily. Take 1/2 tablet  Daily  for BP 45 tablet 3   ondansetron (ZOFRAN) 8 MG tablet Take 1 tablet (8 mg total) by mouth every 8 (eight) hours as needed for nausea or vomiting. May take 30-60 minutes prior to Temodar administration if nausea/vomiting occurs as needed. 30 tablet 1   Polyethyl Glycol-Propyl Glycol (SYSTANE) 0.4-0.3 % GEL ophthalmic gel Place 1 Application into both eyes at bedtime.     TAURINE PO Take 1 capsule by mouth daily.     temozolomide (TEMODAR) 140 MG capsule Take 2 capsules (280 mg total) by mouth daily. Take for 5 days, then off 23 days. May take on an empty stomach to decrease nausea & vomiting. 10 capsule 0   tretinoin (RETIN-A) 0.05 % cream Apply topically at bedtime. (Patient taking differently: Apply 1 application  topically See admin instructions. 1 application at bedtime on Monday, Wednesday, Friday) 45 g 3   No current facility-administered medications for this encounter.    REVIEW OF SYSTEMS:  Notable for that above.   PHYSICAL EXAM:  vitals were not taken for this visit.   General: Alert and oriented, in no acute distress *** HEENT: Head is normocephalic. Extraocular movements are intact. Oropharynx is clear. Neck: Neck is supple, no palpable cervical or supraclavicular lymphadenopathy. Heart: Regular in rate  and rhythm with no murmurs, rubs, or gallops. Chest: Clear to auscultation bilaterally, with no rhonchi, wheezes, or rales. Abdomen: Soft, nontender, nondistended, with no rigidity or guarding. Extremities: No cyanosis or edema. Lymphatics: see Neck Exam Skin: No concerning lesions. Musculoskeletal: symmetric strength and muscle tone throughout. Neurologic: Cranial nerves II through XII are grossly intact. No obvious focalities. Speech is fluent. Coordination is intact. Psychiatric: Judgment and insight are intact. Affect is appropriate.   ECOG = ***  0 - Asymptomatic (Fully active, able to carry on all predisease activities without restriction)  1 - Symptomatic but completely ambulatory (Restricted in physically strenuous activity but ambulatory and able to carry out work of a light or sedentary nature. For example, light housework, office work)  2 - Symptomatic, <50% in bed during the day (Ambulatory and capable of all self care but unable to carry out any work activities. Up and about more than 50% of waking hours)  3 - Symptomatic, >50% in bed, but not  bedbound (Capable of only limited self-care, confined to bed or chair 50% or more of waking hours)  4 - Bedbound (Completely disabled. Cannot carry on any self-care. Totally confined to bed or chair)  5 - Death   Eustace Pen MM, Creech RH, Tormey DC, et al. [phone]). "Toxicity and response criteria of the [facility]". [place] Oncol. 5 (6): 649-55   LABORATORY DATA:  Lab Results  Component Value Date   WBC 10.2 01/26/2022   HGB 12.7 01/26/2022   HCT 37.6 01/26/2022   MCV 96.2 01/26/2022   PLT 207 01/26/2022   CMP     Component Value Date/Time   NA 138 01/26/2022 1100   K 3.5 01/26/2022 1100   CL 101 01/26/2022 1100   CO2 33 (H) 01/26/2022 1100   GLUCOSE 98 01/26/2022 1100   BUN 18 01/26/2022 1100   CREATININE 0.84 01/26/2022 1100   CREATININE 0.85 11/05/2021 1619   CALCIUM 9.2 01/26/2022 1100    PROT 6.7 01/26/2022 1100   ALBUMIN 3.7 01/26/2022 1100   AST 23 01/26/2022 1100   ALT 23 01/26/2022 1100   ALKPHOS 46 01/26/2022 1100   BILITOT 0.6 01/26/2022 1100   GFRNONAA >60 01/26/2022 1100   GFRNONAA 66 09/19/2020 0000   GFRAA 76 09/19/2020 0000         RADIOGRAPHY: MR Brain W and Wo Contrast  Result Date: 01/20/2022 CLINICAL DATA:  History of lymphoma.  Some confusion. EXAM: MRI HEAD WITHOUT AND WITH CONTRAST TECHNIQUE: Multiplanar, multiecho pulse sequences of the brain and surrounding structures were obtained without and with intravenous contrast. CONTRAST:  41m GADAVIST GADOBUTROL 1 MMOL/ML IV SOLN COMPARISON:  MRI 08/28/2021 brain 12/20/2018 FINDINGS: Brain: Compared to prior exam there is marked interval increase in size of the homogeneously contrast-enhancing mass in the right frontal lobe, now measuring to 3.2 x 3.3 cm most recently 1.3 x 1.5 cm. There is marked interval increase in the degree of T2/FLAIR hyperintense signal surrounding this lesion, which now extends along the genu of the corpus callosum (series 8, image 15) and inferiorly to the gyrus rectus. There is mass effect the right frontal lobe with approximate 6 mm midline shift. There is a small amount of hemorrhage along the superior aspect of this mass (series 10, image 45), which may represent changes related to prior biopsy, and is unchanged. Other areas of contrast enhancement involving the right insular cortex, frontal operculum, and the corona radiata are redemonstrated, but appear less conspicuous than on prior exam. No new infarct, hydrocephalus, or extar axial fluid collection. Vascular: Normal flow voids. Skull and upper cervical spine: Normal marrow signal. Sinuses/Orbits: Bilateral lens replacement. Trace left mastoid effusion. Other: None. IMPRESSION: 1. Marked interval increase in size of the homogenously contrast enhancing right frontal lobe mass, as well as the degree of surrounding T2/FLAIR hyperintense  signal, which now extends along the genu of the corpus callosum. There is mass effect the right frontal lobe with approximate 6 mm leftward midline shift. 2. Other areas of contrast enhancement involving the right insular cortex, frontal operculum, and corona radiata are redemonstrated, but appear less conspicuous than on prior exam. Electronically Signed   By: HMarin RobertsM.D.   On: 01/20/2022 09:05   CT Head Wo Contrast  Result Date: 01/20/2022 CLINICAL DATA:  Brain/CNS neoplasm to assess treatment response. Lymphoma with nausea and vomiting. EXAM: CT HEAD WITHOUT CONTRAST TECHNIQUE: Contiguous axial images were obtained from the base of the skull through the vertex without intravenous contrast.  RADIATION DOSE REDUCTION: This exam was performed according to the departmental dose-optimization program which includes automated exposure control, adjustment of the mA and/or kV according to patient size and/or use of iterative reconstruction technique. COMPARISON:  MRI 12/19/2021.  CT 09/03/2021 FINDINGS: Brain: Intra-axial mass lesion in the right frontal lobe measuring 2.8 x 3.2 cm in diameter. Correlation is limited due to difference in modality but the mass appears larger than on the previous MRI and CT studies. There is surrounding vasogenic edema similar to previous studies. Right to left midline shift of about 7 mm. Mass effect is increased since the previous MR. Mild effacement of the anterior horn of the right lateral ventricle. No acute hemorrhage. No new lesions are identified although MRI would be more sensitive for detection of intracranial lesions. Vascular: No hyperdense vessel or unexpected calcification. Skull: Normal. Negative for fracture or focal lesion. Sinuses/Orbits: No acute finding. Other: None. IMPRESSION: 2.8 x 3.2 cm right frontal parenchymal mass with surrounding vasogenic edema. Right to left midline shift of about 7 mm. There appears to be progression in size of the lesion and in  mass effect since the prior MRI. Electronically Signed   By: Lucienne Capers M.D.   On: 01/20/2022 01:04      IMPRESSION/PLAN:***    On date of service, in total, I spent *** minutes on this encounter. Patient was seen in person.   __________________________________________   Eppie Gibson, MD  This document serves as a record of services personally performed by Eppie Gibson, MD. It was created on her behalf by Roney Mans, a trained medical scribe. The creation of this record is based on the scribe's personal observations and the provider's statements to them. This document has been checked and approved by the attending provider.

## 2022-01-27 ENCOUNTER — Ambulatory Visit
Admission: RE | Admit: 2022-01-27 | Discharge: 2022-01-27 | Disposition: A | Discharge status: Home / Self Care | Payer: Medicare PPO | Source: Ambulatory Visit | Attending: Radiation Oncology | Admitting: Radiation Oncology

## 2022-01-27 ENCOUNTER — Encounter: Payer: Self-pay | Admitting: Radiation Oncology

## 2022-01-27 ENCOUNTER — Other Ambulatory Visit: Payer: Self-pay

## 2022-01-27 VITALS — BP 150/74 | HR 60 | Temp 97.2°F | Resp 18 | Ht 64.0 in | Wt 150.2 lb

## 2022-01-27 DIAGNOSIS — E039 Hypothyroidism, unspecified: Secondary | ICD-10-CM | POA: Insufficient documentation

## 2022-01-27 DIAGNOSIS — R531 Weakness: Secondary | ICD-10-CM | POA: Diagnosis not present

## 2022-01-27 DIAGNOSIS — Z8616 Personal history of COVID-19: Secondary | ICD-10-CM | POA: Diagnosis not present

## 2022-01-27 DIAGNOSIS — Z79630 Long term (current) use of alkylating agent: Secondary | ICD-10-CM | POA: Insufficient documentation

## 2022-01-27 DIAGNOSIS — Z809 Family history of malignant neoplasm, unspecified: Secondary | ICD-10-CM | POA: Insufficient documentation

## 2022-01-27 DIAGNOSIS — Z95828 Presence of other vascular implants and grafts: Secondary | ICD-10-CM

## 2022-01-27 DIAGNOSIS — R220 Localized swelling, mass and lump, head: Secondary | ICD-10-CM | POA: Diagnosis not present

## 2022-01-27 DIAGNOSIS — Z79890 Hormone replacement therapy (postmenopausal): Secondary | ICD-10-CM | POA: Diagnosis not present

## 2022-01-27 DIAGNOSIS — Z510 Encounter for antineoplastic radiation therapy: Secondary | ICD-10-CM | POA: Diagnosis not present

## 2022-01-27 DIAGNOSIS — K219 Gastro-esophageal reflux disease without esophagitis: Secondary | ICD-10-CM | POA: Insufficient documentation

## 2022-01-27 DIAGNOSIS — I10 Essential (primary) hypertension: Secondary | ICD-10-CM | POA: Insufficient documentation

## 2022-01-27 DIAGNOSIS — C8589 Other specified types of non-Hodgkin lymphoma, extranodal and solid organ sites: Secondary | ICD-10-CM | POA: Diagnosis not present

## 2022-01-27 DIAGNOSIS — C8510 Unspecified B-cell lymphoma, unspecified site: Secondary | ICD-10-CM

## 2022-01-27 DIAGNOSIS — C8331 Diffuse large B-cell lymphoma, lymph nodes of head, face, and neck: Secondary | ICD-10-CM | POA: Insufficient documentation

## 2022-01-27 DIAGNOSIS — R112 Nausea with vomiting, unspecified: Secondary | ICD-10-CM | POA: Insufficient documentation

## 2022-01-27 DIAGNOSIS — Z7952 Long term (current) use of systemic steroids: Secondary | ICD-10-CM | POA: Insufficient documentation

## 2022-01-27 DIAGNOSIS — R5383 Other fatigue: Secondary | ICD-10-CM | POA: Insufficient documentation

## 2022-01-27 DIAGNOSIS — L249 Irritant contact dermatitis, unspecified cause: Secondary | ICD-10-CM | POA: Diagnosis not present

## 2022-01-27 DIAGNOSIS — Z87891 Personal history of nicotine dependence: Secondary | ICD-10-CM | POA: Insufficient documentation

## 2022-01-27 MED ORDER — SODIUM CHLORIDE 0.9% FLUSH
10.0000 mL | Freq: Once | INTRAVENOUS | Status: AC
  Administered 2022-01-27: 10 mL via INTRAVENOUS

## 2022-01-27 MED ORDER — FLUCONAZOLE 100 MG PO TABS: ORAL_TABLET | ORAL | 0 refills | Status: DC

## 2022-01-27 MED ORDER — HEPARIN SOD (PORK) LOCK FLUSH 100 UNIT/ML IV SOLN
500.0000 [IU] | Freq: Once | INTRAVENOUS | Status: AC
  Administered 2022-01-27: 500 [IU] via INTRAVENOUS

## 2022-01-27 NOTE — Progress Notes (Signed)
Has armband been applied?  Yes.    Does patient have an allergy to IV contrast dye?: No.   Has patient ever received premedication for IV contrast dye?: No.   Date of lab work: January 26, 2022 BUN: 18 CR: 0.84 eGFR: >60  Does patient take metformin?: No.  Is eGFR >60?: Yes.   If no, when can patient resume? (Must be 48 hrs AFTER they receive IV contrast):  N/A  IV site: subclavian right, condition patent and no redness  Has IV site been added to flowsheet?  Yes.    BP (!) 150/74 (BP Location: Right Arm, Patient Position: Sitting, Cuff Size: Normal)   Pulse 60   Temp (!) 97.2 F (36.2 C)   Resp 18   Ht '5\' 4"'  (1.626 m)   Wt 150 lb 3.2 oz (68.1 kg)   SpO2 98%   BMI 25.78 kg/m

## 2022-01-27 NOTE — Progress Notes (Signed)
Location/Histology of Brain Tumor:  Right frontal CNS lymphoma   Patient presented with symptoms of:  "H+P (09/16/21) Patient presented to medical attention in May 2023 with several weeks of progressive gait instability.  She describes feeling unsteady on her feet, even falling "a couple of times".  After one of the falls, ED visit led to CNS imaging which demonstrated multifocal masses in the right hemisphere." On 01/20/2022 "presented with worsening right-sided weakness, nausea, and dizziness. MRI brain obtained on 01/20/2022 showed marked interval increase in right frontal lobe mass with mass effect and 6 mm leftward midline shift."  MRI Brain w/ & w/o Contrast 01/20/2022 IMPRESSION:  Marked interval increase in size of the homogenously contrast enhancing right frontal lobe mass, as well as the degree of surrounding T2/FLAIR hyperintense signal, which now extends along the genu of the corpus callosum. There is mass effect the right frontal lobe with approximate 6 mm leftward midline shift. Other areas of contrast enhancement involving the right insular cortex, frontal operculum, and corona radiata are redemonstrated, but appear less conspicuous than on prior exam.  Past or anticipated interventions, if any, per neurosurgery:  09/02/2021 --Dr. Duffy Rhody FRONTAL STEREOTACTIC BRAIN BIOPSY APPLICATION OF CRANIAL NAVIGATION  Past or anticipated interventions, if any, per medical oncology:  01/26/2022 --Dr. Cecil Cobbs  The temodar/rituximab has not been effective and will be discontinued.   We discussed goals of care and other potential treatment pathways, including resumption of methotrexate, radiation, hospice referral.  They would like to proceed with whole brain radiation therapy at this time.  Consult and Ct/sim has been arranged with Dr. Isidore Moos tomorrow.   Dose and fractionation are TBD at this time.  We recommended decreasing decadron to '2mg'$  daily if tolerated.  We ask that  Daisy Oliver return to clinic in 6 weeks with MRI brain for evaluation following WBRT.  Dose of Decadron, if applicable: 2 mg orally daily (decreased from 4 mg daily yesterday)  Recent neurologic symptoms, if any:  Seizures: Denies Headaches: Denies Nausea: Denies Dizziness/ataxia: Reports occasionally feeling dizzy when she changes position (such as laying to sitting or sitting to standing). Ambulates with a cane Difficulty with hand coordination: Yes--difficulty with fine motor skills (such as buttoning a shirt or opening a bottle) Focal numbness/weakness: Denies Visual deficits/changes: Denies blurry vision or double vision, but does feel her vision overall has declined Confusion/Memory deficits: Denies and patient's home health aide who accompanies her today says patient is able to remember things (such as where items have been placed) very well  SAFETY ISSUES: Prior radiation? No Pacemaker/ICD? No Possible current pregnancy? No--postmenopausal Is the patient on methotrexate? No  Additional Complaints / other details: Reports she feels fatigued, generalized weakness, and has minor aches and pains all the time "I'm uncomfortable and can't really sleep, so I live on Tylenol to manage". She is planning to leave for a beach trip next Thursday

## 2022-01-28 ENCOUNTER — Other Ambulatory Visit (HOSPITAL_COMMUNITY): Payer: Self-pay

## 2022-01-28 DIAGNOSIS — Z510 Encounter for antineoplastic radiation therapy: Secondary | ICD-10-CM | POA: Diagnosis not present

## 2022-01-28 DIAGNOSIS — C8589 Other specified types of non-Hodgkin lymphoma, extranodal and solid organ sites: Secondary | ICD-10-CM | POA: Diagnosis not present

## 2022-01-28 DIAGNOSIS — C8331 Diffuse large B-cell lymphoma, lymph nodes of head, face, and neck: Secondary | ICD-10-CM | POA: Diagnosis not present

## 2022-01-28 DIAGNOSIS — C8510 Unspecified B-cell lymphoma, unspecified site: Secondary | ICD-10-CM | POA: Diagnosis not present

## 2022-01-29 ENCOUNTER — Ambulatory Visit
Admission: RE | Admit: 2022-01-29 | Discharge: 2022-01-29 | Disposition: A | Discharge status: Home / Self Care | Payer: Medicare PPO | Source: Ambulatory Visit | Attending: Radiation Oncology | Admitting: Radiation Oncology

## 2022-01-29 ENCOUNTER — Other Ambulatory Visit: Payer: Self-pay

## 2022-01-29 ENCOUNTER — Ambulatory Visit: Payer: Medicare PPO

## 2022-01-29 ENCOUNTER — Ambulatory Visit: Payer: Medicare PPO | Admitting: Internal Medicine

## 2022-01-29 ENCOUNTER — Other Ambulatory Visit: Payer: Medicare PPO

## 2022-01-29 DIAGNOSIS — C8331 Diffuse large B-cell lymphoma, lymph nodes of head, face, and neck: Secondary | ICD-10-CM | POA: Diagnosis not present

## 2022-01-29 DIAGNOSIS — C8510 Unspecified B-cell lymphoma, unspecified site: Secondary | ICD-10-CM | POA: Diagnosis not present

## 2022-01-29 DIAGNOSIS — Z510 Encounter for antineoplastic radiation therapy: Secondary | ICD-10-CM | POA: Diagnosis not present

## 2022-01-29 DIAGNOSIS — C8589 Other specified types of non-Hodgkin lymphoma, extranodal and solid organ sites: Secondary | ICD-10-CM | POA: Diagnosis not present

## 2022-01-29 LAB — RAD ONC ARIA SESSION SUMMARY
Course Elapsed Days: 0
Plan Fractions Treated to Date: 1
Plan Prescribed Dose Per Fraction: 2.3 Gy
Plan Total Fractions Prescribed: 17
Plan Total Prescribed Dose: 39.1 Gy
Reference Point Dosage Given to Date: 2.3 Gy
Reference Point Session Dosage Given: 2.3 Gy
Session Number: 1

## 2022-01-30 ENCOUNTER — Encounter: Payer: Self-pay | Admitting: Internal Medicine

## 2022-01-30 ENCOUNTER — Other Ambulatory Visit: Payer: Self-pay

## 2022-01-30 ENCOUNTER — Ambulatory Visit
Admission: RE | Admit: 2022-01-30 | Discharge: 2022-01-30 | Disposition: A | Discharge status: Home / Self Care | Payer: Medicare PPO | Source: Ambulatory Visit | Attending: Radiation Oncology | Admitting: Radiation Oncology

## 2022-01-30 ENCOUNTER — Telehealth: Payer: Self-pay | Admitting: Nurse Practitioner

## 2022-01-30 DIAGNOSIS — C8331 Diffuse large B-cell lymphoma, lymph nodes of head, face, and neck: Secondary | ICD-10-CM | POA: Diagnosis not present

## 2022-01-30 DIAGNOSIS — C8510 Unspecified B-cell lymphoma, unspecified site: Secondary | ICD-10-CM | POA: Diagnosis not present

## 2022-01-30 DIAGNOSIS — Z510 Encounter for antineoplastic radiation therapy: Secondary | ICD-10-CM | POA: Diagnosis not present

## 2022-01-30 DIAGNOSIS — C8589 Other specified types of non-Hodgkin lymphoma, extranodal and solid organ sites: Secondary | ICD-10-CM | POA: Diagnosis not present

## 2022-01-30 DIAGNOSIS — E039 Hypothyroidism, unspecified: Secondary | ICD-10-CM

## 2022-01-30 LAB — RAD ONC ARIA SESSION SUMMARY
Course Elapsed Days: 1
Plan Fractions Treated to Date: 2
Plan Prescribed Dose Per Fraction: 2.3 Gy
Plan Total Fractions Prescribed: 17
Plan Total Prescribed Dose: 39.1 Gy
Reference Point Dosage Given to Date: 4.6 Gy
Reference Point Session Dosage Given: 2.3 Gy
Session Number: 2

## 2022-01-30 MED ORDER — LIOTHYRONINE SODIUM 5 MCG PO TABS: ORAL_TABLET | ORAL | 3 refills | Status: DC

## 2022-01-30 NOTE — Addendum Note (Signed)
Addended by: Chancy Hurter on: 01/30/2022 12:06 PM   Modules accepted: Orders

## 2022-01-30 NOTE — Telephone Encounter (Signed)
Pt is requesting a refill on liothyronine (CYTOMEL) 5 MCG tablet

## 2022-02-02 ENCOUNTER — Telehealth: Payer: Self-pay | Admitting: Nurse Practitioner

## 2022-02-02 ENCOUNTER — Ambulatory Visit
Admission: RE | Admit: 2022-02-02 | Discharge: 2022-02-02 | Disposition: A | Discharge status: Home / Self Care | Payer: Medicare PPO | Source: Ambulatory Visit | Attending: Radiation Oncology | Admitting: Radiation Oncology

## 2022-02-02 ENCOUNTER — Other Ambulatory Visit: Payer: Medicare PPO

## 2022-02-02 ENCOUNTER — Other Ambulatory Visit: Payer: Self-pay

## 2022-02-02 ENCOUNTER — Ambulatory Visit: Payer: Medicare PPO | Admitting: Internal Medicine

## 2022-02-02 ENCOUNTER — Ambulatory Visit: Payer: Medicare PPO

## 2022-02-02 DIAGNOSIS — E039 Hypothyroidism, unspecified: Secondary | ICD-10-CM

## 2022-02-02 DIAGNOSIS — C8331 Diffuse large B-cell lymphoma, lymph nodes of head, face, and neck: Secondary | ICD-10-CM | POA: Diagnosis not present

## 2022-02-02 DIAGNOSIS — G935 Compression of brain: Secondary | ICD-10-CM

## 2022-02-02 DIAGNOSIS — C8589 Other specified types of non-Hodgkin lymphoma, extranodal and solid organ sites: Secondary | ICD-10-CM | POA: Diagnosis not present

## 2022-02-02 DIAGNOSIS — C8510 Unspecified B-cell lymphoma, unspecified site: Secondary | ICD-10-CM | POA: Diagnosis not present

## 2022-02-02 DIAGNOSIS — Z510 Encounter for antineoplastic radiation therapy: Secondary | ICD-10-CM | POA: Diagnosis not present

## 2022-02-02 LAB — RAD ONC ARIA SESSION SUMMARY
Course Elapsed Days: 4
Plan Fractions Treated to Date: 3
Plan Prescribed Dose Per Fraction: 2.3 Gy
Plan Total Fractions Prescribed: 17
Plan Total Prescribed Dose: 39.1 Gy
Reference Point Dosage Given to Date: 6.9 Gy
Reference Point Session Dosage Given: 2.3 Gy
Session Number: 3

## 2022-02-02 MED ORDER — SONAFINE EX EMUL
1.0000 | Freq: Two times a day (BID) | CUTANEOUS | Status: DC
  Administered 2022-02-02: 1 via TOPICAL

## 2022-02-02 MED ORDER — LIOTHYRONINE SODIUM 5 MCG PO TABS: ORAL_TABLET | ORAL | 3 refills | Status: DC

## 2022-02-02 NOTE — Progress Notes (Signed)
Pt here for patient teaching. Pt given Radiation and You booklet, Managing Acute Radiation Side Effects for Head and Neck Cancer handout, skin care instructions, and Sonafine.  Reviewed areas of pertinence such as fatigue, hair loss, mouth changes, nausea and vomiting, skin changes, throat changes, headache, blurry vision, earaches, and taste changes. Pt able to give teach back of to pat skin, use unscented/gentle soap, and drink plenty of water, apply Sonafine bid, avoid applying anything to skin within 4 hours of treatment, and to use an electric razor if they must shave. Pt verbalizes understanding of information given and will contact nursing with any questions or concerns.     Http://rtanswers.org/treatmentinformation/whattoexpect/index      

## 2022-02-02 NOTE — Addendum Note (Signed)
Addended by: Chancy Hurter on: 02/02/2022 11:24 AM   Modules accepted: Orders

## 2022-02-02 NOTE — Telephone Encounter (Signed)
[facility] [place] [facility] in the [place]. [facility]  [address].

## 2022-02-03 ENCOUNTER — Other Ambulatory Visit: Payer: Self-pay

## 2022-02-03 ENCOUNTER — Ambulatory Visit
Admission: RE | Admit: 2022-02-03 | Discharge: 2022-02-03 | Disposition: A | Discharge status: Home / Self Care | Payer: Medicare PPO | Source: Ambulatory Visit | Attending: Radiation Oncology | Admitting: Radiation Oncology

## 2022-02-03 DIAGNOSIS — C8331 Diffuse large B-cell lymphoma, lymph nodes of head, face, and neck: Secondary | ICD-10-CM | POA: Diagnosis not present

## 2022-02-03 DIAGNOSIS — C8589 Other specified types of non-Hodgkin lymphoma, extranodal and solid organ sites: Secondary | ICD-10-CM | POA: Diagnosis not present

## 2022-02-03 DIAGNOSIS — C8510 Unspecified B-cell lymphoma, unspecified site: Secondary | ICD-10-CM | POA: Diagnosis not present

## 2022-02-03 DIAGNOSIS — Z510 Encounter for antineoplastic radiation therapy: Secondary | ICD-10-CM | POA: Diagnosis not present

## 2022-02-03 LAB — RAD ONC ARIA SESSION SUMMARY
Course Elapsed Days: 5
Plan Fractions Treated to Date: 4
Plan Prescribed Dose Per Fraction: 2.3 Gy
Plan Total Fractions Prescribed: 17
Plan Total Prescribed Dose: 39.1 Gy
Reference Point Dosage Given to Date: 9.2 Gy
Reference Point Session Dosage Given: 2.3 Gy
Session Number: 4

## 2022-02-04 ENCOUNTER — Other Ambulatory Visit: Payer: Self-pay

## 2022-02-04 ENCOUNTER — Ambulatory Visit
Admission: RE | Admit: 2022-02-04 | Discharge: 2022-02-04 | Disposition: A | Discharge status: Home / Self Care | Payer: Medicare PPO | Source: Ambulatory Visit | Attending: Radiation Oncology | Admitting: Radiation Oncology

## 2022-02-04 DIAGNOSIS — C8510 Unspecified B-cell lymphoma, unspecified site: Secondary | ICD-10-CM | POA: Diagnosis not present

## 2022-02-04 DIAGNOSIS — C8589 Other specified types of non-Hodgkin lymphoma, extranodal and solid organ sites: Secondary | ICD-10-CM | POA: Diagnosis not present

## 2022-02-04 DIAGNOSIS — Z510 Encounter for antineoplastic radiation therapy: Secondary | ICD-10-CM | POA: Diagnosis not present

## 2022-02-04 DIAGNOSIS — C8331 Diffuse large B-cell lymphoma, lymph nodes of head, face, and neck: Secondary | ICD-10-CM | POA: Diagnosis not present

## 2022-02-04 LAB — RAD ONC ARIA SESSION SUMMARY
Course Elapsed Days: 6
Plan Fractions Treated to Date: 5
Plan Prescribed Dose Per Fraction: 2.3 Gy
Plan Total Fractions Prescribed: 17
Plan Total Prescribed Dose: 39.1 Gy
Reference Point Dosage Given to Date: 11.5 Gy
Reference Point Session Dosage Given: 2.3 Gy
Session Number: 5

## 2022-02-05 ENCOUNTER — Other Ambulatory Visit: Payer: Self-pay

## 2022-02-05 ENCOUNTER — Ambulatory Visit
Admission: RE | Admit: 2022-02-05 | Discharge: 2022-02-05 | Disposition: A | Discharge status: Home / Self Care | Payer: Medicare PPO | Source: Ambulatory Visit | Attending: Radiation Oncology | Admitting: Radiation Oncology

## 2022-02-05 DIAGNOSIS — C8510 Unspecified B-cell lymphoma, unspecified site: Secondary | ICD-10-CM | POA: Diagnosis not present

## 2022-02-05 DIAGNOSIS — C8331 Diffuse large B-cell lymphoma, lymph nodes of head, face, and neck: Secondary | ICD-10-CM | POA: Diagnosis not present

## 2022-02-05 DIAGNOSIS — Z510 Encounter for antineoplastic radiation therapy: Secondary | ICD-10-CM | POA: Diagnosis not present

## 2022-02-05 DIAGNOSIS — C8589 Other specified types of non-Hodgkin lymphoma, extranodal and solid organ sites: Secondary | ICD-10-CM | POA: Diagnosis not present

## 2022-02-05 LAB — RAD ONC ARIA SESSION SUMMARY
Course Elapsed Days: 7
Plan Fractions Treated to Date: 6
Plan Prescribed Dose Per Fraction: 2.3 Gy
Plan Total Fractions Prescribed: 17
Plan Total Prescribed Dose: 39.1 Gy
Reference Point Dosage Given to Date: 13.8 Gy
Reference Point Session Dosage Given: 2.3 Gy
Session Number: 6

## 2022-02-06 ENCOUNTER — Ambulatory Visit: Payer: Medicare PPO

## 2022-02-09 ENCOUNTER — Ambulatory Visit
Admission: RE | Admit: 2022-02-09 | Discharge: 2022-02-09 | Disposition: A | Discharge status: Home / Self Care | Payer: Medicare PPO | Source: Ambulatory Visit | Attending: Radiation Oncology | Admitting: Radiation Oncology

## 2022-02-09 ENCOUNTER — Ambulatory Visit: Payer: Medicare PPO

## 2022-02-09 ENCOUNTER — Other Ambulatory Visit: Payer: Self-pay

## 2022-02-09 DIAGNOSIS — C8589 Other specified types of non-Hodgkin lymphoma, extranodal and solid organ sites: Secondary | ICD-10-CM | POA: Diagnosis not present

## 2022-02-09 DIAGNOSIS — Z510 Encounter for antineoplastic radiation therapy: Secondary | ICD-10-CM | POA: Diagnosis not present

## 2022-02-09 DIAGNOSIS — C8510 Unspecified B-cell lymphoma, unspecified site: Secondary | ICD-10-CM | POA: Diagnosis not present

## 2022-02-09 DIAGNOSIS — C8331 Diffuse large B-cell lymphoma, lymph nodes of head, face, and neck: Secondary | ICD-10-CM | POA: Diagnosis not present

## 2022-02-09 LAB — RAD ONC ARIA SESSION SUMMARY
Course Elapsed Days: 11
Plan Fractions Treated to Date: 7
Plan Prescribed Dose Per Fraction: 2.3 Gy
Plan Total Fractions Prescribed: 17
Plan Total Prescribed Dose: 39.1 Gy
Reference Point Dosage Given to Date: 16.1 Gy
Reference Point Session Dosage Given: 2.3 Gy
Session Number: 7

## 2022-02-10 ENCOUNTER — Other Ambulatory Visit: Payer: Self-pay

## 2022-02-10 ENCOUNTER — Ambulatory Visit
Admission: RE | Admit: 2022-02-10 | Discharge: 2022-02-10 | Disposition: A | Discharge status: Home / Self Care | Payer: Medicare PPO | Source: Ambulatory Visit | Attending: Radiation Oncology | Admitting: Radiation Oncology

## 2022-02-10 DIAGNOSIS — S335XXD Sprain of ligaments of lumbar spine, subsequent encounter: Secondary | ICD-10-CM | POA: Diagnosis not present

## 2022-02-10 DIAGNOSIS — R269 Unspecified abnormalities of gait and mobility: Secondary | ICD-10-CM | POA: Diagnosis not present

## 2022-02-10 DIAGNOSIS — M6281 Muscle weakness (generalized): Secondary | ICD-10-CM | POA: Diagnosis not present

## 2022-02-10 DIAGNOSIS — C8331 Diffuse large B-cell lymphoma, lymph nodes of head, face, and neck: Secondary | ICD-10-CM | POA: Diagnosis not present

## 2022-02-10 DIAGNOSIS — C711 Malignant neoplasm of frontal lobe: Secondary | ICD-10-CM | POA: Diagnosis not present

## 2022-02-10 DIAGNOSIS — Z510 Encounter for antineoplastic radiation therapy: Secondary | ICD-10-CM | POA: Diagnosis not present

## 2022-02-10 DIAGNOSIS — Z9181 History of falling: Secondary | ICD-10-CM | POA: Diagnosis not present

## 2022-02-10 DIAGNOSIS — C8589 Other specified types of non-Hodgkin lymphoma, extranodal and solid organ sites: Secondary | ICD-10-CM | POA: Diagnosis not present

## 2022-02-10 DIAGNOSIS — C8510 Unspecified B-cell lymphoma, unspecified site: Secondary | ICD-10-CM | POA: Diagnosis not present

## 2022-02-10 LAB — RAD ONC ARIA SESSION SUMMARY
Course Elapsed Days: 12
Plan Fractions Treated to Date: 8
Plan Prescribed Dose Per Fraction: 2.3 Gy
Plan Total Fractions Prescribed: 17
Plan Total Prescribed Dose: 39.1 Gy
Reference Point Dosage Given to Date: 18.4 Gy
Reference Point Session Dosage Given: 2.3 Gy
Session Number: 8

## 2022-02-11 ENCOUNTER — Other Ambulatory Visit: Payer: Self-pay

## 2022-02-11 ENCOUNTER — Ambulatory Visit
Admission: RE | Admit: 2022-02-11 | Discharge: 2022-02-11 | Disposition: A | Discharge status: Home / Self Care | Payer: Medicare PPO | Source: Ambulatory Visit | Attending: Radiation Oncology | Admitting: Radiation Oncology

## 2022-02-11 DIAGNOSIS — Z510 Encounter for antineoplastic radiation therapy: Secondary | ICD-10-CM | POA: Diagnosis not present

## 2022-02-11 DIAGNOSIS — C8331 Diffuse large B-cell lymphoma, lymph nodes of head, face, and neck: Secondary | ICD-10-CM | POA: Diagnosis not present

## 2022-02-11 DIAGNOSIS — C8510 Unspecified B-cell lymphoma, unspecified site: Secondary | ICD-10-CM | POA: Diagnosis not present

## 2022-02-11 DIAGNOSIS — C8589 Other specified types of non-Hodgkin lymphoma, extranodal and solid organ sites: Secondary | ICD-10-CM | POA: Diagnosis not present

## 2022-02-11 LAB — RAD ONC ARIA SESSION SUMMARY
Course Elapsed Days: 13
Plan Fractions Treated to Date: 9
Plan Prescribed Dose Per Fraction: 2.3 Gy
Plan Total Fractions Prescribed: 17
Plan Total Prescribed Dose: 39.1 Gy
Reference Point Dosage Given to Date: 20.7 Gy
Reference Point Session Dosage Given: 2.3 Gy
Session Number: 9

## 2022-02-12 ENCOUNTER — Ambulatory Visit
Admission: RE | Admit: 2022-02-12 | Discharge: 2022-02-12 | Disposition: A | Discharge status: Home / Self Care | Payer: Medicare PPO | Source: Ambulatory Visit | Attending: Radiation Oncology | Admitting: Radiation Oncology

## 2022-02-12 ENCOUNTER — Other Ambulatory Visit: Payer: Self-pay

## 2022-02-12 DIAGNOSIS — C8510 Unspecified B-cell lymphoma, unspecified site: Secondary | ICD-10-CM | POA: Diagnosis not present

## 2022-02-12 DIAGNOSIS — Z510 Encounter for antineoplastic radiation therapy: Secondary | ICD-10-CM | POA: Diagnosis not present

## 2022-02-12 DIAGNOSIS — C8331 Diffuse large B-cell lymphoma, lymph nodes of head, face, and neck: Secondary | ICD-10-CM | POA: Diagnosis not present

## 2022-02-12 DIAGNOSIS — C8589 Other specified types of non-Hodgkin lymphoma, extranodal and solid organ sites: Secondary | ICD-10-CM | POA: Diagnosis not present

## 2022-02-12 LAB — RAD ONC ARIA SESSION SUMMARY
Course Elapsed Days: 14
Plan Fractions Treated to Date: 10
Plan Prescribed Dose Per Fraction: 2.3 Gy
Plan Total Fractions Prescribed: 17
Plan Total Prescribed Dose: 39.1 Gy
Reference Point Dosage Given to Date: 23 Gy
Reference Point Session Dosage Given: 2.3 Gy
Session Number: 10

## 2022-02-13 ENCOUNTER — Other Ambulatory Visit: Payer: Self-pay

## 2022-02-13 ENCOUNTER — Ambulatory Visit
Admission: RE | Admit: 2022-02-13 | Discharge: 2022-02-13 | Disposition: A | Discharge status: Home / Self Care | Payer: Medicare PPO | Source: Ambulatory Visit | Attending: Radiation Oncology | Admitting: Radiation Oncology

## 2022-02-13 DIAGNOSIS — Z510 Encounter for antineoplastic radiation therapy: Secondary | ICD-10-CM | POA: Diagnosis not present

## 2022-02-13 DIAGNOSIS — C8589 Other specified types of non-Hodgkin lymphoma, extranodal and solid organ sites: Secondary | ICD-10-CM | POA: Diagnosis not present

## 2022-02-13 DIAGNOSIS — C8510 Unspecified B-cell lymphoma, unspecified site: Secondary | ICD-10-CM | POA: Diagnosis not present

## 2022-02-13 DIAGNOSIS — C8331 Diffuse large B-cell lymphoma, lymph nodes of head, face, and neck: Secondary | ICD-10-CM | POA: Diagnosis not present

## 2022-02-13 LAB — RAD ONC ARIA SESSION SUMMARY
Course Elapsed Days: 15
Plan Fractions Treated to Date: 11
Plan Prescribed Dose Per Fraction: 2.3 Gy
Plan Total Fractions Prescribed: 17
Plan Total Prescribed Dose: 39.1 Gy
Reference Point Dosage Given to Date: 25.3 Gy
Reference Point Session Dosage Given: 2.3 Gy
Session Number: 11

## 2022-02-16 ENCOUNTER — Other Ambulatory Visit: Payer: Self-pay

## 2022-02-16 ENCOUNTER — Ambulatory Visit: Payer: Medicare PPO

## 2022-02-16 ENCOUNTER — Ambulatory Visit
Admission: RE | Admit: 2022-02-16 | Discharge: 2022-02-16 | Disposition: A | Discharge status: Home / Self Care | Payer: Medicare PPO | Source: Ambulatory Visit | Attending: Radiation Oncology | Admitting: Radiation Oncology

## 2022-02-16 DIAGNOSIS — C8331 Diffuse large B-cell lymphoma, lymph nodes of head, face, and neck: Secondary | ICD-10-CM | POA: Diagnosis not present

## 2022-02-16 DIAGNOSIS — C8510 Unspecified B-cell lymphoma, unspecified site: Secondary | ICD-10-CM | POA: Diagnosis not present

## 2022-02-16 DIAGNOSIS — Z510 Encounter for antineoplastic radiation therapy: Secondary | ICD-10-CM | POA: Diagnosis not present

## 2022-02-16 DIAGNOSIS — C8589 Other specified types of non-Hodgkin lymphoma, extranodal and solid organ sites: Secondary | ICD-10-CM | POA: Diagnosis not present

## 2022-02-16 LAB — RAD ONC ARIA SESSION SUMMARY
Course Elapsed Days: 18
Plan Fractions Treated to Date: 12
Plan Prescribed Dose Per Fraction: 2.3 Gy
Plan Total Fractions Prescribed: 17
Plan Total Prescribed Dose: 39.1 Gy
Reference Point Dosage Given to Date: 27.6 Gy
Reference Point Session Dosage Given: 2.3 Gy
Session Number: 12

## 2022-02-17 ENCOUNTER — Other Ambulatory Visit: Payer: Self-pay

## 2022-02-17 ENCOUNTER — Ambulatory Visit
Admission: RE | Admit: 2022-02-17 | Discharge: 2022-02-17 | Disposition: A | Discharge status: Home / Self Care | Payer: Medicare PPO | Source: Ambulatory Visit | Attending: Radiation Oncology | Admitting: Radiation Oncology

## 2022-02-17 ENCOUNTER — Other Ambulatory Visit: Payer: Self-pay | Admitting: Radiation Therapy

## 2022-02-17 DIAGNOSIS — C8589 Other specified types of non-Hodgkin lymphoma, extranodal and solid organ sites: Secondary | ICD-10-CM | POA: Diagnosis not present

## 2022-02-17 DIAGNOSIS — Z510 Encounter for antineoplastic radiation therapy: Secondary | ICD-10-CM | POA: Diagnosis not present

## 2022-02-17 DIAGNOSIS — C8331 Diffuse large B-cell lymphoma, lymph nodes of head, face, and neck: Secondary | ICD-10-CM | POA: Diagnosis not present

## 2022-02-17 DIAGNOSIS — C8510 Unspecified B-cell lymphoma, unspecified site: Secondary | ICD-10-CM | POA: Diagnosis not present

## 2022-02-17 LAB — RAD ONC ARIA SESSION SUMMARY
Course Elapsed Days: 19
Plan Fractions Treated to Date: 13
Plan Prescribed Dose Per Fraction: 2.3 Gy
Plan Total Fractions Prescribed: 17
Plan Total Prescribed Dose: 39.1 Gy
Reference Point Dosage Given to Date: 29.9 Gy
Reference Point Session Dosage Given: 2.3 Gy
Session Number: 13

## 2022-02-18 ENCOUNTER — Other Ambulatory Visit: Payer: Self-pay

## 2022-02-18 ENCOUNTER — Ambulatory Visit
Admission: RE | Admit: 2022-02-18 | Discharge: 2022-02-18 | Disposition: A | Discharge status: Home / Self Care | Payer: Medicare PPO | Source: Ambulatory Visit | Attending: Radiation Oncology | Admitting: Radiation Oncology

## 2022-02-18 DIAGNOSIS — Z510 Encounter for antineoplastic radiation therapy: Secondary | ICD-10-CM | POA: Diagnosis not present

## 2022-02-18 DIAGNOSIS — C8331 Diffuse large B-cell lymphoma, lymph nodes of head, face, and neck: Secondary | ICD-10-CM | POA: Diagnosis not present

## 2022-02-18 DIAGNOSIS — C8510 Unspecified B-cell lymphoma, unspecified site: Secondary | ICD-10-CM | POA: Diagnosis not present

## 2022-02-18 DIAGNOSIS — C8589 Other specified types of non-Hodgkin lymphoma, extranodal and solid organ sites: Secondary | ICD-10-CM | POA: Diagnosis not present

## 2022-02-18 LAB — RAD ONC ARIA SESSION SUMMARY
Course Elapsed Days: 20
Plan Fractions Treated to Date: 14
Plan Prescribed Dose Per Fraction: 2.3 Gy
Plan Total Fractions Prescribed: 17
Plan Total Prescribed Dose: 39.1 Gy
Reference Point Dosage Given to Date: 32.2 Gy
Reference Point Session Dosage Given: 2.3 Gy
Session Number: 14

## 2022-02-19 ENCOUNTER — Telehealth: Payer: Self-pay | Admitting: Radiation Therapy

## 2022-02-19 ENCOUNTER — Other Ambulatory Visit: Payer: Self-pay

## 2022-02-19 ENCOUNTER — Ambulatory Visit
Admission: RE | Admit: 2022-02-19 | Discharge: 2022-02-19 | Disposition: A | Discharge status: Home / Self Care | Payer: Medicare PPO | Source: Ambulatory Visit | Attending: Radiation Oncology | Admitting: Radiation Oncology

## 2022-02-19 DIAGNOSIS — Z510 Encounter for antineoplastic radiation therapy: Secondary | ICD-10-CM | POA: Diagnosis not present

## 2022-02-19 DIAGNOSIS — C8331 Diffuse large B-cell lymphoma, lymph nodes of head, face, and neck: Secondary | ICD-10-CM | POA: Diagnosis not present

## 2022-02-19 DIAGNOSIS — C8589 Other specified types of non-Hodgkin lymphoma, extranodal and solid organ sites: Secondary | ICD-10-CM | POA: Diagnosis not present

## 2022-02-19 DIAGNOSIS — C8510 Unspecified B-cell lymphoma, unspecified site: Secondary | ICD-10-CM | POA: Diagnosis not present

## 2022-02-19 LAB — RAD ONC ARIA SESSION SUMMARY
Course Elapsed Days: 21
Plan Fractions Treated to Date: 15
Plan Prescribed Dose Per Fraction: 2.3 Gy
Plan Total Fractions Prescribed: 17
Plan Total Prescribed Dose: 39.1 Gy
Reference Point Dosage Given to Date: 34.5 Gy
Reference Point Session Dosage Given: 2.3 Gy
Session Number: 15

## 2022-02-19 NOTE — Telephone Encounter (Signed)
I returned a call to Daisy Oliver. She had a question about the MRI ordered by Dr. Mickeal Skinner for the end of the month. I let her know that the scan has not been scheduled yet, but if she would like to go ahead and do that she could. She is concerned about overbooking her PT and plans to go ahead and get that scan on the schedule so she can work around it.   Mont Dutton R.T.(R)(T) Radiation Special Procedures Navigator

## 2022-02-20 ENCOUNTER — Ambulatory Visit
Admission: RE | Admit: 2022-02-20 | Discharge: 2022-02-20 | Disposition: A | Discharge status: Home / Self Care | Payer: Medicare PPO | Source: Ambulatory Visit | Attending: Radiation Oncology | Admitting: Radiation Oncology

## 2022-02-20 ENCOUNTER — Other Ambulatory Visit: Payer: Self-pay

## 2022-02-20 DIAGNOSIS — Z510 Encounter for antineoplastic radiation therapy: Secondary | ICD-10-CM | POA: Diagnosis not present

## 2022-02-20 DIAGNOSIS — C8331 Diffuse large B-cell lymphoma, lymph nodes of head, face, and neck: Secondary | ICD-10-CM | POA: Diagnosis not present

## 2022-02-20 DIAGNOSIS — C8589 Other specified types of non-Hodgkin lymphoma, extranodal and solid organ sites: Secondary | ICD-10-CM | POA: Diagnosis not present

## 2022-02-20 DIAGNOSIS — C8510 Unspecified B-cell lymphoma, unspecified site: Secondary | ICD-10-CM | POA: Diagnosis not present

## 2022-02-20 LAB — RAD ONC ARIA SESSION SUMMARY
Course Elapsed Days: 22
Plan Fractions Treated to Date: 16
Plan Prescribed Dose Per Fraction: 2.3 Gy
Plan Total Fractions Prescribed: 17
Plan Total Prescribed Dose: 39.1 Gy
Reference Point Dosage Given to Date: 36.8 Gy
Reference Point Session Dosage Given: 2.3 Gy
Session Number: 16

## 2022-02-23 ENCOUNTER — Ambulatory Visit: Payer: Medicare PPO

## 2022-02-23 ENCOUNTER — Other Ambulatory Visit: Payer: Self-pay

## 2022-02-23 ENCOUNTER — Ambulatory Visit
Admission: RE | Admit: 2022-02-23 | Discharge: 2022-02-23 | Disposition: A | Discharge status: Home / Self Care | Payer: Medicare PPO | Source: Ambulatory Visit | Attending: Radiation Oncology | Admitting: Radiation Oncology

## 2022-02-23 ENCOUNTER — Encounter: Payer: Self-pay | Admitting: Radiation Oncology

## 2022-02-23 DIAGNOSIS — C8510 Unspecified B-cell lymphoma, unspecified site: Secondary | ICD-10-CM | POA: Diagnosis not present

## 2022-02-23 DIAGNOSIS — C8589 Other specified types of non-Hodgkin lymphoma, extranodal and solid organ sites: Secondary | ICD-10-CM | POA: Diagnosis not present

## 2022-02-23 DIAGNOSIS — Z510 Encounter for antineoplastic radiation therapy: Secondary | ICD-10-CM | POA: Diagnosis not present

## 2022-02-23 DIAGNOSIS — C8331 Diffuse large B-cell lymphoma, lymph nodes of head, face, and neck: Secondary | ICD-10-CM | POA: Diagnosis not present

## 2022-02-23 LAB — RAD ONC ARIA SESSION SUMMARY
Course Elapsed Days: 25
Plan Fractions Treated to Date: 17
Plan Prescribed Dose Per Fraction: 2.3 Gy
Plan Total Fractions Prescribed: 17
Plan Total Prescribed Dose: 39.1 Gy
Reference Point Dosage Given to Date: 39.1 Gy
Reference Point Session Dosage Given: 2.3 Gy
Session Number: 17

## 2022-02-24 ENCOUNTER — Ambulatory Visit: Payer: Medicare PPO

## 2022-02-24 DIAGNOSIS — R269 Unspecified abnormalities of gait and mobility: Secondary | ICD-10-CM | POA: Diagnosis not present

## 2022-02-24 DIAGNOSIS — S335XXD Sprain of ligaments of lumbar spine, subsequent encounter: Secondary | ICD-10-CM | POA: Diagnosis not present

## 2022-02-24 DIAGNOSIS — Z9181 History of falling: Secondary | ICD-10-CM | POA: Diagnosis not present

## 2022-02-24 DIAGNOSIS — M6281 Muscle weakness (generalized): Secondary | ICD-10-CM | POA: Diagnosis not present

## 2022-02-25 ENCOUNTER — Ambulatory Visit: Payer: Medicare PPO

## 2022-02-26 ENCOUNTER — Ambulatory Visit: Payer: Medicare PPO

## 2022-02-27 ENCOUNTER — Ambulatory Visit: Payer: Medicare PPO

## 2022-03-02 ENCOUNTER — Other Ambulatory Visit: Payer: Self-pay | Admitting: Internal Medicine

## 2022-03-02 ENCOUNTER — Ambulatory Visit: Payer: Medicare PPO

## 2022-03-03 ENCOUNTER — Ambulatory Visit: Payer: Medicare PPO

## 2022-03-03 ENCOUNTER — Encounter: Payer: Self-pay | Admitting: Internal Medicine

## 2022-03-03 DIAGNOSIS — M6281 Muscle weakness (generalized): Secondary | ICD-10-CM | POA: Diagnosis not present

## 2022-03-03 DIAGNOSIS — R269 Unspecified abnormalities of gait and mobility: Secondary | ICD-10-CM | POA: Diagnosis not present

## 2022-03-03 DIAGNOSIS — Z9181 History of falling: Secondary | ICD-10-CM | POA: Diagnosis not present

## 2022-03-03 DIAGNOSIS — S335XXD Sprain of ligaments of lumbar spine, subsequent encounter: Secondary | ICD-10-CM | POA: Diagnosis not present

## 2022-03-03 NOTE — Progress Notes (Signed)
                                                                                                                                                             Patient Name: Daisy Oliver MRN: [id number] DOB: 1936-03-19 Referring Physician: Cecil Cobbs Date of Service: 02/23/2022 [facility], [place]                                                        End Of Treatment Note  Diagnoses: C83.31-Diffuse large b-cell lymphoma, lymph nodes of head, face, and neck  CNS lymphoma  Intent: Aggressive locoregional control  Radiation Treatment Dates: 01/29/2022 through 02/23/2022 Site Technique Total Dose (Gy) Dose per Fx (Gy) Completed Fx Beam Energies  Brain: Brain_Lymphom IMRT 39.1/39.1 2.3 17/17 6X   Narrative: The patient tolerated radiation therapy relatively well. Whole brain was treated with dose-painting technique to boost gross disease to highest dose.  Plan: The patient will follow-up with radiation oncology in 55mo sooner prn.  -----------------------------------  SEppie Gibson MD

## 2022-03-04 ENCOUNTER — Ambulatory Visit: Payer: Medicare PPO

## 2022-03-10 ENCOUNTER — Other Ambulatory Visit: Payer: Self-pay | Admitting: Internal Medicine

## 2022-03-10 ENCOUNTER — Telehealth: Payer: Self-pay | Admitting: *Deleted

## 2022-03-10 DIAGNOSIS — Z9181 History of falling: Secondary | ICD-10-CM | POA: Diagnosis not present

## 2022-03-10 DIAGNOSIS — S335XXD Sprain of ligaments of lumbar spine, subsequent encounter: Secondary | ICD-10-CM | POA: Diagnosis not present

## 2022-03-10 DIAGNOSIS — M6281 Muscle weakness (generalized): Secondary | ICD-10-CM | POA: Diagnosis not present

## 2022-03-10 DIAGNOSIS — R269 Unspecified abnormalities of gait and mobility: Secondary | ICD-10-CM | POA: Diagnosis not present

## 2022-03-10 MED ORDER — DEXAMETHASONE 2 MG PO TABS: 2.0000 mg | ORAL_TABLET | Freq: Every day | ORAL | 0 refills | Status: DC

## 2022-03-10 NOTE — Telephone Encounter (Signed)
Patient called stating that she hasn't been taking the Decadron '2mg'$  for over a week and has noticed a decline in her ability to walk.  She is asking if she should continue the steroid and if so then she will need a refill because she stopped because she ran out of it.    Also gave guidance on use of Imodium for her diarrhea episodes.    Routing to Dr Mickeal Skinner to advise.

## 2022-03-10 NOTE — Telephone Encounter (Signed)
Communicated restart of Decadron to patient.  She stated understanding and denies having any further questions at this time.

## 2022-03-12 ENCOUNTER — Ambulatory Visit (HOSPITAL_COMMUNITY)
Admission: RE | Admit: 2022-03-12 | Discharge: 2022-03-12 | Disposition: A | Discharge status: Home / Self Care | Payer: Medicare PPO | Source: Ambulatory Visit | Attending: Internal Medicine | Admitting: Internal Medicine

## 2022-03-12 DIAGNOSIS — Z9181 History of falling: Secondary | ICD-10-CM | POA: Diagnosis not present

## 2022-03-12 DIAGNOSIS — I129 Hypertensive chronic kidney disease with stage 1 through stage 4 chronic kidney disease, or unspecified chronic kidney disease: Secondary | ICD-10-CM | POA: Diagnosis not present

## 2022-03-12 DIAGNOSIS — S335XXD Sprain of ligaments of lumbar spine, subsequent encounter: Secondary | ICD-10-CM | POA: Diagnosis not present

## 2022-03-12 DIAGNOSIS — N182 Chronic kidney disease, stage 2 (mild): Secondary | ICD-10-CM | POA: Diagnosis not present

## 2022-03-12 DIAGNOSIS — M6281 Muscle weakness (generalized): Secondary | ICD-10-CM | POA: Diagnosis not present

## 2022-03-12 DIAGNOSIS — C8589 Other specified types of non-Hodgkin lymphoma, extranodal and solid organ sites: Secondary | ICD-10-CM | POA: Diagnosis not present

## 2022-03-12 DIAGNOSIS — G9389 Other specified disorders of brain: Secondary | ICD-10-CM | POA: Diagnosis not present

## 2022-03-12 DIAGNOSIS — R269 Unspecified abnormalities of gait and mobility: Secondary | ICD-10-CM | POA: Diagnosis not present

## 2022-03-12 DIAGNOSIS — C711 Malignant neoplasm of frontal lobe: Secondary | ICD-10-CM | POA: Diagnosis not present

## 2022-03-12 MED ORDER — GADOBUTROL 1 MMOL/ML IV SOLN
6.0000 mL | Freq: Once | INTRAVENOUS | Status: AC | PRN
  Administered 2022-03-12: 6 mL via INTRAVENOUS

## 2022-03-13 ENCOUNTER — Telehealth: Payer: Self-pay

## 2022-03-13 NOTE — Telephone Encounter (Signed)
Received VM from Jeanne Ivan (nurse with home palliative care [place]). She stated when she saw patient this week, she noticed that patient's cheeks were reddened and she had an area that looked swollen from the right side of her nose across the cheek. She wanted to know if this could be related to patient's radiation (last treatment 02/23/22). She provided a call-back number of [phone] if needed for further details.  Called and spoke with patient directly to inquire about symptoms. She stated the swollen area was related to an spot that was irritated from the facemask during her treatment. She recently put the Sonafine on that area and reports she had an acute dermal reaction; she also mentioned dealing with rosacea on and off for years. Since she stopped using the Sonafine, the symptoms have improved. Asked patient to send photos of reddened area via [facility]. She stated she would have her nurse aide help her upload the photos.

## 2022-03-16 ENCOUNTER — Inpatient Hospital Stay: Payer: Medicare PPO | Attending: Neurosurgery

## 2022-03-16 DIAGNOSIS — R531 Weakness: Secondary | ICD-10-CM | POA: Insufficient documentation

## 2022-03-16 DIAGNOSIS — Z79899 Other long term (current) drug therapy: Secondary | ICD-10-CM | POA: Insufficient documentation

## 2022-03-16 DIAGNOSIS — Z79630 Long term (current) use of alkylating agent: Secondary | ICD-10-CM | POA: Insufficient documentation

## 2022-03-16 DIAGNOSIS — C8339 Diffuse large B-cell lymphoma, extranodal and solid organ sites: Secondary | ICD-10-CM | POA: Insufficient documentation

## 2022-03-16 DIAGNOSIS — Z923 Personal history of irradiation: Secondary | ICD-10-CM | POA: Insufficient documentation

## 2022-03-16 DIAGNOSIS — Z809 Family history of malignant neoplasm, unspecified: Secondary | ICD-10-CM | POA: Insufficient documentation

## 2022-03-16 DIAGNOSIS — M25561 Pain in right knee: Secondary | ICD-10-CM | POA: Insufficient documentation

## 2022-03-16 DIAGNOSIS — Z8261 Family history of arthritis: Secondary | ICD-10-CM | POA: Insufficient documentation

## 2022-03-16 DIAGNOSIS — R202 Paresthesia of skin: Secondary | ICD-10-CM | POA: Insufficient documentation

## 2022-03-16 DIAGNOSIS — E039 Hypothyroidism, unspecified: Secondary | ICD-10-CM | POA: Insufficient documentation

## 2022-03-16 DIAGNOSIS — Z808 Family history of malignant neoplasm of other organs or systems: Secondary | ICD-10-CM | POA: Insufficient documentation

## 2022-03-16 DIAGNOSIS — Z818 Family history of other mental and behavioral disorders: Secondary | ICD-10-CM | POA: Insufficient documentation

## 2022-03-16 DIAGNOSIS — Z882 Allergy status to sulfonamides status: Secondary | ICD-10-CM | POA: Insufficient documentation

## 2022-03-16 DIAGNOSIS — R2689 Other abnormalities of gait and mobility: Secondary | ICD-10-CM | POA: Insufficient documentation

## 2022-03-16 DIAGNOSIS — Z8616 Personal history of COVID-19: Secondary | ICD-10-CM | POA: Insufficient documentation

## 2022-03-16 DIAGNOSIS — Z8262 Family history of osteoporosis: Secondary | ICD-10-CM | POA: Insufficient documentation

## 2022-03-16 DIAGNOSIS — R200 Anesthesia of skin: Secondary | ICD-10-CM | POA: Insufficient documentation

## 2022-03-16 DIAGNOSIS — Z8269 Family history of other diseases of the musculoskeletal system and connective tissue: Secondary | ICD-10-CM | POA: Insufficient documentation

## 2022-03-16 DIAGNOSIS — R2681 Unsteadiness on feet: Secondary | ICD-10-CM | POA: Insufficient documentation

## 2022-03-16 DIAGNOSIS — Z8249 Family history of ischemic heart disease and other diseases of the circulatory system: Secondary | ICD-10-CM | POA: Insufficient documentation

## 2022-03-16 DIAGNOSIS — I10 Essential (primary) hypertension: Secondary | ICD-10-CM | POA: Insufficient documentation

## 2022-03-17 ENCOUNTER — Telehealth: Payer: Self-pay

## 2022-03-17 ENCOUNTER — Inpatient Hospital Stay (HOSPITAL_BASED_OUTPATIENT_CLINIC_OR_DEPARTMENT_OTHER): Payer: Medicare PPO | Admitting: Internal Medicine

## 2022-03-17 VITALS — BP 109/64 | HR 66 | Temp 97.5°F | Resp 16 | Ht 64.0 in | Wt 147.3 lb

## 2022-03-17 DIAGNOSIS — Z8261 Family history of arthritis: Secondary | ICD-10-CM | POA: Diagnosis not present

## 2022-03-17 DIAGNOSIS — Z882 Allergy status to sulfonamides status: Secondary | ICD-10-CM | POA: Diagnosis not present

## 2022-03-17 DIAGNOSIS — Z923 Personal history of irradiation: Secondary | ICD-10-CM | POA: Diagnosis not present

## 2022-03-17 DIAGNOSIS — R531 Weakness: Secondary | ICD-10-CM | POA: Diagnosis not present

## 2022-03-17 DIAGNOSIS — R269 Unspecified abnormalities of gait and mobility: Secondary | ICD-10-CM | POA: Diagnosis not present

## 2022-03-17 DIAGNOSIS — Z808 Family history of malignant neoplasm of other organs or systems: Secondary | ICD-10-CM | POA: Diagnosis not present

## 2022-03-17 DIAGNOSIS — Z8262 Family history of osteoporosis: Secondary | ICD-10-CM | POA: Diagnosis not present

## 2022-03-17 DIAGNOSIS — R200 Anesthesia of skin: Secondary | ICD-10-CM | POA: Diagnosis not present

## 2022-03-17 DIAGNOSIS — R202 Paresthesia of skin: Secondary | ICD-10-CM | POA: Diagnosis not present

## 2022-03-17 DIAGNOSIS — R2681 Unsteadiness on feet: Secondary | ICD-10-CM | POA: Diagnosis not present

## 2022-03-17 DIAGNOSIS — Z9181 History of falling: Secondary | ICD-10-CM | POA: Diagnosis not present

## 2022-03-17 DIAGNOSIS — C8589 Other specified types of non-Hodgkin lymphoma, extranodal and solid organ sites: Secondary | ICD-10-CM | POA: Diagnosis not present

## 2022-03-17 DIAGNOSIS — R2689 Other abnormalities of gait and mobility: Secondary | ICD-10-CM | POA: Diagnosis not present

## 2022-03-17 DIAGNOSIS — Z8249 Family history of ischemic heart disease and other diseases of the circulatory system: Secondary | ICD-10-CM | POA: Diagnosis not present

## 2022-03-17 DIAGNOSIS — I10 Essential (primary) hypertension: Secondary | ICD-10-CM | POA: Diagnosis not present

## 2022-03-17 DIAGNOSIS — S335XXD Sprain of ligaments of lumbar spine, subsequent encounter: Secondary | ICD-10-CM | POA: Diagnosis not present

## 2022-03-17 DIAGNOSIS — E039 Hypothyroidism, unspecified: Secondary | ICD-10-CM | POA: Diagnosis not present

## 2022-03-17 DIAGNOSIS — Z8269 Family history of other diseases of the musculoskeletal system and connective tissue: Secondary | ICD-10-CM | POA: Diagnosis not present

## 2022-03-17 DIAGNOSIS — M25561 Pain in right knee: Secondary | ICD-10-CM | POA: Diagnosis not present

## 2022-03-17 DIAGNOSIS — M6281 Muscle weakness (generalized): Secondary | ICD-10-CM | POA: Diagnosis not present

## 2022-03-17 DIAGNOSIS — C8339 Diffuse large B-cell lymphoma, extranodal and solid organ sites: Secondary | ICD-10-CM | POA: Diagnosis not present

## 2022-03-17 DIAGNOSIS — Z79630 Long term (current) use of alkylating agent: Secondary | ICD-10-CM | POA: Diagnosis not present

## 2022-03-17 DIAGNOSIS — Z818 Family history of other mental and behavioral disorders: Secondary | ICD-10-CM | POA: Diagnosis not present

## 2022-03-17 DIAGNOSIS — Z79899 Other long term (current) drug therapy: Secondary | ICD-10-CM | POA: Diagnosis not present

## 2022-03-17 DIAGNOSIS — Z809 Family history of malignant neoplasm, unspecified: Secondary | ICD-10-CM | POA: Diagnosis not present

## 2022-03-17 DIAGNOSIS — Z8616 Personal history of COVID-19: Secondary | ICD-10-CM | POA: Diagnosis not present

## 2022-03-17 MED ORDER — GABAPENTIN 100 MG PO CAPS: 100.0000 mg | ORAL_CAPSULE | Freq: Two times a day (BID) | ORAL | 3 refills | Status: DC

## 2022-03-17 MED ORDER — DEXAMETHASONE 1 MG PO TABS: 1.0000 mg | ORAL_TABLET | Freq: Every day | ORAL | 0 refills | Status: DC

## 2022-03-17 NOTE — Telephone Encounter (Signed)
RN Anderson Malta called Tammy RN concerning her phone call about pt patients redness on her face. A picture was sent and reviewed by Juliette Mangle. The photo that was sent showed redness that was not concerning on pt face. Rn called patients nurse Tammy and left a message for pt as well.

## 2022-03-17 NOTE — Progress Notes (Signed)
Nathalie at [facility] [place], [place] [address] [phone]   Interval Evaluation  Date of Service: 03/17/22 Patient Name: Natacia Chaisson Patient MRN: [id number] Patient DOB: [birth year] Provider: [name], MD  Identifying Statement:  Brendia Dampier is a 86 y.o. female with right frontal  CNS lymphoma    Oncologic History: Oncology History  Primary CNS lymphoma ([place])  09/02/2021 Surgery   Stereotactic biopsy by Dr. Marcello Moores; path is B-cell lymphoma CD20+   09/30/2021 - 12/05/2021 Chemotherapy   Patient is on Treatment Plan : IP NON-HODGKINS LYMPHOMA High Dose Methotrexate + Leucovorin Rescue     10/02/2021 -  Chemotherapy   Patient is on Treatment Plan : NON-HODGKINS LYMPHOMA Rituximab q21d     01/01/2022 -  Chemotherapy   Patient is on Treatment Plan : BRAIN GLIOBLASTOMA Consolidation Temozolomide Days 1-5 q28 Days      01/29/2022 - 02/23/2022 Radiation Therapy   Whole brain radiation with right frontal boost with Dr. Isidore Moos     Biomarkers:  CD20 positive .  Ki-67 50% .   Unknown   Unknown   Interval History: Aaniyah Strohm presents today for follow up after recent MRI brain, having completed radiation therapy.  Tolerated treatment well overall. No recurrence of nausea/vomiting, dizziness experienced prior.  Currently independent with gait and functional status, but does describe modest increase in left sided weakness, clumsiness.  Right knee remains in pain, also limiting gait.  Continues to describe numbness, tingling of bottoms of feet since chemo.  H+P (09/16/21) Patient presented to medical attention in May 2023 with several weeks of progressive gait instability.  She describes feeling unsteady on her feet, even falling "a couple of times".  After one of the falls, ED visit led to CNS imaging which demonstrated multifocal masses in the right hemisphere.  She underwent stereotactic biopsy with Dr. Marcello Moores on  09/02/21; path demonstrated diffuse large B-cell lymphoma, CD20+.  Since surgery, she has tapered off the steroids without issue.  She feels "fairly normal" aside from pain in her knee and lower back.  She uses a cane to walk, mainly because of the knee pain.  No issues with frank weakness, no seizures.  She lives at home with her husband who has dementia.  Daughter is nearby in [place].  Medications: Current Outpatient Medications on File Prior to Visit  Medication Sig Dispense Refill   acetaminophen (TYLENOL) 500 MG tablet Take 500 mg by mouth 2 (two) times daily as needed (pain).     Ascorbic Acid (VITAMIN C PO) Take 1 tablet by mouth daily.     BIOTIN PO Take 1 tablet by mouth 2 (two) times daily.     dexamethasone (DECADRON) 2 MG tablet Take 1 tablet (2 mg total) by mouth daily. 60 tablet 0   famotidine (PEPCID) 20 MG tablet Take 20 mg by mouth at bedtime as needed for indigestion.     fluconazole (DIFLUCAN) 100 MG tablet Take 2 tablets today, then 1 tablet daily x 20 more days. 22 tablet 0   liothyronine (CYTOMEL) 5 MCG tablet Take 5 mcg by mouth in the and at bedtime. 180 tablet 3   MAGNESIUM PO Take 1 capsule by mouth 2 (two) times daily.     Melatonin 10 MG TABS Take 10 mg by mouth at bedtime.     Menthol, Topical Analgesic, (BIOFREEZE EX) Apply 1 Application topically at bedtime as needed (leg pain).     Misc Natural  Products (OSTEO BI-FLEX JOINT SHIELD PO) Take 1 tablet by mouth 2 (two) times daily.     Multiple Vitamins-Minerals (ZINC PO) Take 1 tablet by mouth 2 (two) times daily.     olmesartan (BENICAR) 20 MG tablet Take 0.5 tablets (10 mg total) by mouth daily. Take 1/2 tablet  Daily  for BP 45 tablet 3   ondansetron (ZOFRAN) 8 MG tablet Take 1 tablet (8 mg total) by mouth every 8 (eight) hours as needed for nausea or vomiting. May take 30-60 minutes prior to Temodar administration if nausea/vomiting occurs as needed. 30 tablet 1   Polyethyl Glycol-Propyl Glycol (SYSTANE) 0.4-0.3  % GEL ophthalmic gel Place 1 Application into both eyes at bedtime.     TAURINE PO Take 1 capsule by mouth daily.     temozolomide (TEMODAR) 140 MG capsule Take 2 capsules (280 mg total) by mouth daily. Take for 5 days, then off 23 days. May take on an empty stomach to decrease nausea & vomiting. 10 capsule 0   tretinoin (RETIN-A) 0.05 % cream Apply topically at bedtime. (Patient taking differently: Apply 1 application  topically See admin instructions. 1 application at bedtime on Monday, Wednesday, Friday) 45 g 3   No current facility-administered medications on file prior to visit.    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[place])    diagnosed at age 49   Cancer [facility])    CNS lymphoma ([place])    COVID-19 08/26/2020   Fracture of radial head, right, closed 09/16/2012   GERD (gastroesophageal reflux disease)    pepcid prn   Hypertension    Hypothyroidism    PONV (postoperative nausea and vomiting)    Past Surgical History:  Past Surgical History:  Procedure Laterality Date   APPLICATION OF CRANIAL NAVIGATION Right 09/02/2021   Procedure: APPLICATION OF CRANIAL NAVIGATION;  Surgeon: Vallarie Mare, MD;  Location: [facility];  Service: Neurosurgery;  Laterality: Right;   CATARACT EXTRACTION, BILATERAL  2020   Dr. Herbert Deaner   FRAMELESS  BIOPSY WITH BRAINLAB Right 09/02/2021   Procedure: FRONTAL STEREOTACTIC BRAIN BIOPSY;  Surgeon: Vallarie Mare, MD;  Location: [facility];  Service: Neurosurgery;  Laterality: Right;   IR IMAGING GUIDED PORT INSERTION  09/24/2021   RADIAL HEAD ARTHROPLASTY Right 09/16/2012   Procedure: RADIAL  HEAD ARTHROPLASTY;  Surgeon: Johnny Bridge, MD;  Location: [facility];  Service: Orthopedics;  Laterality: Right;  RADIAL HEAD REPLACEMENT     Social History:  Social History   Socioeconomic History   Marital status: Married    Spouse name: Not on file   Number of children: 2   Years of education: Not on file   Highest education level: Not on file  Occupational History   Not on file  Tobacco Use   Smoking status: Former    Packs/day: 2.00    Years: 22.00    Total pack years: 44.00    Types: Cigarettes    Quit date: 09/17/1983    Years since quitting: 38.5   Smokeless tobacco: Never  Vaping Use  Vaping Use: Never used  Substance and Sexual Activity   Alcohol use: Not Currently    Alcohol/week: 0.0 - 1.0 standard drinks of alcohol    Comment: 2/month   Drug use: No   Sexual activity: Not Currently  Other Topics Concern   Not on file  Social History Narrative   Not on file   Social Determinants of Health   Financial Resource Strain: Not on file  Food Insecurity: No Food Insecurity (01/20/2022)   Hunger Vital Sign    Worried About Running Out of Food in the Last Year: Never true    Ran Out of Food in the Last Year: Never true  Transportation Needs: No Transportation Needs (01/20/2022)   PRAPARE - Hydrologist (Medical): No    Lack of Transportation (Non-Medical): No  Physical Activity: Sufficiently Active (09/14/2017)   Exercise Vital Sign    Days of Exercise per Week: 5 days    Minutes of Exercise per Session: 60 min  Stress: No Stress Concern Present (09/14/2017)   [place]    Feeling of Stress : Only a little  Social Connections: Not on file  Intimate Partner Violence: Not At Risk (01/20/2022)   Humiliation, Afraid, Rape, and Kick questionnaire    Fear of Current or Ex-Partner: No    Emotionally Abused: No    Physically Abused: No    Sexually Abused:  No   Family History:  Family History  Problem Relation Age of Onset   Heart disease Mother    Brain cancer Father 47       Brain tumor, not cancerous   Hypertension Sister    Osteoporosis Sister    Arthritis Sister    Scoliosis Sister    Hypertension Sister    Osteoporosis Sister    Cancer Paternal Grandfather    Suicidality Paternal Grandfather    Breast cancer Neg Hx     Review of Systems: Constitutional: Doesn't report fevers, chills or abnormal weight loss Eyes: Doesn't report blurriness of vision Ears, nose, mouth, throat, and face: Doesn't report sore throat Respiratory: Doesn't report cough, dyspnea or wheezes Cardiovascular: Doesn't report palpitation, chest discomfort  Gastrointestinal:  Doesn't report nausea, constipation, diarrhea GU: Doesn't report incontinence Skin: Doesn't report skin rashes Neurological: Per HPI Musculoskeletal: Doesn't report joint pain Behavioral/Psych: Doesn't report anxiety  Physical Exam: Vitals:   03/17/22 0947  BP: 109/64  Pulse: 66  Resp: 16  Temp: (!) 97.5 F (36.4 C)  SpO2: 96%    KPS: 80. General: Alert, cooperative, pleasant, in no acute distress Head: Normal EENT: No conjunctival injection or scleral icterus.  Lungs: Resp effort normal Cardiac: Regular rate Abdomen: Non-distended abdomen Skin: No rashes cyanosis or petechiae. Extremities: No clubbing or edema  Neurologic Exam: Mental Status: Awake, alert, attentive to examiner. Oriented to self and environment. Language is fluent with intact comprehension.  Cranial Nerves: Visual acuity is grossly normal. Visual fields are full. Extra-ocular movements intact. No ptosis. Face is symmetric Motor: Tone and bulk are normal. Power is full in both arms and legs, with subtle drift noted in left arm. Reflexes are symmetric, no pathologic reflexes present.  Sensory: Intact to light touch Gait: Orthopedic limitation, cane assisted   Labs: I have reviewed the data as  listed    Component Value Date/Time   NA 138 01/26/2022 1100   K 3.5 01/26/2022 1100   CL 101 01/26/2022 1100   CO2 33 (H) 01/26/2022  1100   GLUCOSE 98 01/26/2022 1100   BUN 18 01/26/2022 1100   CREATININE 0.84 01/26/2022 1100   CREATININE 0.85 11/05/2021 1619   CALCIUM 9.2 01/26/2022 1100   PROT 6.7 01/26/2022 1100   ALBUMIN 3.7 01/26/2022 1100   AST 23 01/26/2022 1100   ALT 23 01/26/2022 1100   ALKPHOS 46 01/26/2022 1100   BILITOT 0.6 01/26/2022 1100   GFRNONAA >60 01/26/2022 1100   GFRNONAA 66 09/19/2020 0000   GFRAA 76 09/19/2020 0000   Lab Results  Component Value Date   WBC 10.2 01/26/2022   NEUTROABS 6.5 01/26/2022   HGB 12.7 01/26/2022   HCT 37.6 01/26/2022   MCV 96.2 01/26/2022   PLT 207 01/26/2022    Imaging:  [place] Clinician Interpretation: I have personally reviewed the CNS images as listed.  My interpretation, in the context of the patient's clinical presentation, is stable disease  MR BRAIN W WO CONTRAST  Result Date: 03/12/2022 CLINICAL DATA:  Brain/CNS neoplasm, assess treatment response EXAM: MRI HEAD WITHOUT AND WITH CONTRAST TECHNIQUE: Multiplanar, multiecho pulse sequences of the brain and surrounding structures were obtained without and with intravenous contrast. CONTRAST:  44m GADAVIST GADOBUTROL 1 MMOL/ML IV SOLN COMPARISON:  MRI October 10, 23. FINDINGS: Brain: Marked interval decrease in rule size and conspicuity of right frontal enhancing lesion which now has more indistinct/ill-defined enhancement. Although difficult to measure, the area of enhancement is estimated to be up to 1.8 x 1.5 cm on series 16, image 92) with now predominantly peripheral enhancement. Surrounding T2/FLAIR hyperintensities also improved. Additional areas of ill-defined enhancement in the right insula and overlying frontal lobe are mildly decreased in conspicuity. No new areas of enhancement identified. No evidence of acute hemorrhage acute infarct, significant midline shift, or  hydrocephalus. Vascular: Major arterial flow voids are maintained at the skull base. Skull and upper cervical spine: Normal marrow signal. Sinuses/Orbits: Largely clear sinuses.  No acute orbital findings. Other: No mastoid effusions. IMPRESSION: 1. Marked interval decrease in size and conspicuity of enhancement in the right frontal lobe with decreased surrounding T2/FLAIR hyperintensity, compatible with response to treatment. 2. Additional areas of ill-defined enhancement in the right insula and overlying frontal lobe are mildly decreased in conspicuity. Electronically Signed   By: FMargaretha SheffieldM.D.   On: 03/12/2022 17:37      Assessment/Plan [place] CNS [place] ([place]  [place] Rich[place]ageis clinically stable today, now having completed whole brain radiation therapy.  Left sided weakness is mild and relatively stable from prior; it is explainable to this point with right frontal lobe tumor, treatment, radiation exposure.   MRI brain demonstrates significant improvement in burden of enhancement within right frontal mass refractory to the high dose methotrexate.  No other progressive or concerning findings.  We recommended decreasing decadron to 182mdaily, and ultimately discontinuing if tolerated.   Ok with trial of gabapentin 10031mID for neuropathic pain symptoms.  We ask that LynJosephina Gipturn to clinic in 3 months with MRI brain for evaluation.  She will let us [place] if left sided weakness continues to progress, we would then consider re-imaging her spine.  All questions were answered. The patient knows to call the clinic with any problems, questions or concerns. No barriers to learning were detected.  The total time spent in the encounter was 40 minutes and more than 50% was on counseling and review of test results   ZacVentura SellersD Medical Director of Neuro-Oncology [facility]  03/17/22 9:42 AM

## 2022-03-18 ENCOUNTER — Encounter: Payer: Self-pay | Admitting: Internal Medicine

## 2022-03-18 ENCOUNTER — Telehealth: Payer: Self-pay | Admitting: Internal Medicine

## 2022-03-18 NOTE — Telephone Encounter (Signed)
Per 12/5 los called and spoke to pt about appointment

## 2022-03-19 ENCOUNTER — Other Ambulatory Visit (HOSPITAL_COMMUNITY): Payer: Self-pay

## 2022-03-19 DIAGNOSIS — Z9181 History of falling: Secondary | ICD-10-CM | POA: Diagnosis not present

## 2022-03-19 DIAGNOSIS — S335XXD Sprain of ligaments of lumbar spine, subsequent encounter: Secondary | ICD-10-CM | POA: Diagnosis not present

## 2022-03-19 DIAGNOSIS — R269 Unspecified abnormalities of gait and mobility: Secondary | ICD-10-CM | POA: Diagnosis not present

## 2022-03-19 DIAGNOSIS — M6281 Muscle weakness (generalized): Secondary | ICD-10-CM | POA: Diagnosis not present

## 2022-03-24 DIAGNOSIS — L814 Other melanin hyperpigmentation: Secondary | ICD-10-CM | POA: Diagnosis not present

## 2022-03-24 DIAGNOSIS — M71341 Other bursal cyst, right hand: Secondary | ICD-10-CM | POA: Diagnosis not present

## 2022-03-24 DIAGNOSIS — D225 Melanocytic nevi of trunk: Secondary | ICD-10-CM | POA: Diagnosis not present

## 2022-03-24 DIAGNOSIS — M6281 Muscle weakness (generalized): Secondary | ICD-10-CM | POA: Diagnosis not present

## 2022-03-24 DIAGNOSIS — Z9181 History of falling: Secondary | ICD-10-CM | POA: Diagnosis not present

## 2022-03-24 DIAGNOSIS — L821 Other seborrheic keratosis: Secondary | ICD-10-CM | POA: Diagnosis not present

## 2022-03-24 DIAGNOSIS — R269 Unspecified abnormalities of gait and mobility: Secondary | ICD-10-CM | POA: Diagnosis not present

## 2022-03-24 DIAGNOSIS — Z85828 Personal history of other malignant neoplasm of skin: Secondary | ICD-10-CM | POA: Diagnosis not present

## 2022-03-24 DIAGNOSIS — Z08 Encounter for follow-up examination after completed treatment for malignant neoplasm: Secondary | ICD-10-CM | POA: Diagnosis not present

## 2022-03-24 DIAGNOSIS — S335XXD Sprain of ligaments of lumbar spine, subsequent encounter: Secondary | ICD-10-CM | POA: Diagnosis not present

## 2022-03-30 ENCOUNTER — Telehealth: Payer: Self-pay | Admitting: *Deleted

## 2022-03-30 NOTE — Telephone Encounter (Signed)
RETURNED PATIENT'S FRIEND'S PHONE CALL, [place] PHONE CALL, LVM FOR A RETURN CALL

## 2022-03-31 DIAGNOSIS — M6281 Muscle weakness (generalized): Secondary | ICD-10-CM | POA: Diagnosis not present

## 2022-03-31 DIAGNOSIS — R269 Unspecified abnormalities of gait and mobility: Secondary | ICD-10-CM | POA: Diagnosis not present

## 2022-03-31 DIAGNOSIS — Z9181 History of falling: Secondary | ICD-10-CM | POA: Diagnosis not present

## 2022-03-31 DIAGNOSIS — S335XXD Sprain of ligaments of lumbar spine, subsequent encounter: Secondary | ICD-10-CM | POA: Diagnosis not present

## 2022-04-01 ENCOUNTER — Ambulatory Visit (INDEPENDENT_AMBULATORY_CARE_PROVIDER_SITE_OTHER): Payer: Medicare PPO | Admitting: Nurse Practitioner

## 2022-04-01 VITALS — BP 120/64 | HR 58 | Temp 97.7°F | Ht 64.0 in | Wt 147.0 lb

## 2022-04-01 DIAGNOSIS — Z0001 Encounter for general adult medical examination with abnormal findings: Secondary | ICD-10-CM

## 2022-04-01 DIAGNOSIS — G43809 Other migraine, not intractable, without status migrainosus: Secondary | ICD-10-CM

## 2022-04-01 DIAGNOSIS — E039 Hypothyroidism, unspecified: Secondary | ICD-10-CM | POA: Diagnosis not present

## 2022-04-01 DIAGNOSIS — R6889 Other general symptoms and signs: Secondary | ICD-10-CM | POA: Diagnosis not present

## 2022-04-01 DIAGNOSIS — Z79899 Other long term (current) drug therapy: Secondary | ICD-10-CM

## 2022-04-01 DIAGNOSIS — L719 Rosacea, unspecified: Secondary | ICD-10-CM | POA: Diagnosis not present

## 2022-04-01 DIAGNOSIS — I10 Essential (primary) hypertension: Secondary | ICD-10-CM | POA: Diagnosis not present

## 2022-04-01 DIAGNOSIS — M25561 Pain in right knee: Secondary | ICD-10-CM

## 2022-04-01 DIAGNOSIS — E559 Vitamin D deficiency, unspecified: Secondary | ICD-10-CM | POA: Diagnosis not present

## 2022-04-01 DIAGNOSIS — E785 Hyperlipidemia, unspecified: Secondary | ICD-10-CM

## 2022-04-01 DIAGNOSIS — Z85828 Personal history of other malignant neoplasm of skin: Secondary | ICD-10-CM

## 2022-04-01 DIAGNOSIS — G8929 Other chronic pain: Secondary | ICD-10-CM

## 2022-04-01 DIAGNOSIS — Z0000 Encounter for general adult medical examination without abnormal findings: Secondary | ICD-10-CM

## 2022-04-01 DIAGNOSIS — M810 Age-related osteoporosis without current pathological fracture: Secondary | ICD-10-CM

## 2022-04-01 DIAGNOSIS — C8589 Other specified types of non-Hodgkin lymphoma, extranodal and solid organ sites: Secondary | ICD-10-CM | POA: Diagnosis not present

## 2022-04-01 DIAGNOSIS — C8339 Primary central nervous system lymphoma: Secondary | ICD-10-CM

## 2022-04-01 DIAGNOSIS — N182 Chronic kidney disease, stage 2 (mild): Secondary | ICD-10-CM

## 2022-04-01 NOTE — Progress Notes (Signed)
AWV  Assessment:   Annual Medicare Wellness Visit Due annually  Health maintenance reviewed  Other migraine without status migrainosus, not intractable Avoid triggers, controlled  Hypothyroidism, unspecified type Continue Levothyroxine. Reminded to take on an empty stomach 30-42mns before food.  Stop any Biotin Supplement 48-72 hours before next TSH level to reduce the risk of falsely low TSH levels. Continue to monitor.    Rosacea Continue follow up derm as needed  Hypertension Discussed DASH (Dietary Approaches to Stop Hypertension) DASH diet is lower in sodium than a typical American diet. Cut back on foods that are high in saturated fat, cholesterol, and trans fats. Eat more whole-grain foods, fish, poultry, and nuts Remain active and exercise as tolerated daily.  Monitor BP at home-Call if greater than 130/80.  Check CMP/CBC  Hyperlipidemia, unspecified hyperlipidemia type Discussed lifestyle modifications. Recommended diet heavy in fruits and veggies, omega 3's. Decrease consumption of animal meats, cheeses, and dairy products. Remain active and exercise as tolerated. Continue to monitor. Check lipids/TSH   Vitamin D deficiency Continue supplement Check vitamin D levels  CKD II Discussed how what you eat and drink can aide in kidney protection. Stay well hydrated. Avoid high salt foods. Avoid NSAIDS. Keep BP and BG well controlled.   Take medications as prescribed. Remain active and exercise as tolerated daily. Maintain weight.  Continue to monitor. Check CMP/GFR/Microablumin   History of BCC Derm follows annually   Osteoporosis, unspecified osteoporosis type, unspecified pathological fracture presence Declines foxamax, continue vitamin D, calcium Pursue a combination of weight-bearing exercises and strength training. Advised on fall prevention measures including proper lighting in all rooms, removal of area rugs and floor clutter, use of walking  devices as deemed appropriate, avoidance of uneven walking surfaces. Smoking cessation and moderate alcohol consumption if applicable Consume 8814to 1000 IU of vitamin D daily with a goal vitamin D serum value of 30 ng/mL or higher. Aim for 1000 to 1200 mg of elemental calcium daily through supplements and/or dietary sources.  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 Continue to follow with Dr. VMickeal Skinner Oncology  Medication management All medications discussed and reviewed in full. All questions and concerns regarding medications addressed.    Right knee pain Continue PT Continue Gabapentin Continue to monitor   Orders Placed This Encounter  Procedures   CBC with Differential/Platelet   COMPLETE METABOLIC PANEL WITH GFR   Lipid panel   VITAMIN D 25 Hydroxy (Vit-D Deficiency, Fractures)   TSH    Notify office for further evaluation and treatment, questions or concerns if any reported s/s fail to improve.   The patient was advised to call back or seek an in-person evaluation if any symptoms worsen or if the condition fails to improve as anticipated.   Further disposition pending results of labs. Discussed med's effects and SE's.    I discussed the assessment and treatment plan with the patient. The patient was provided an opportunity to ask questions and all were answered. The patient agreed with the plan and demonstrated an understanding of the instructions.  Discussed med's effects and SE's. Screening labs and tests as requested with regular follow-up as recommended.  I provided 40 minutes of face-to-face time during this encounter including counseling, chart review, and critical decision making was preformed.    Future Appointments  Date Time Provider DElizabeth 05/01/2022  2:00 PM [facility]ppie Gibson [facility]  06/16/2022  9:30 AM Vaslow, ZAcey Lav [facility]  09/24/2022  3:00 PM Alycia Rossetti, NP [facility] None      Subjective:   Daisy Oliver is a 86 y.o. female who presents for AWV and follow up. She has Essential hypertension; Hyperlipidemia; Migraines; Rosacea; Vitamin D deficiency; Medication management; Acquired hypothyroidism; History of basal cell cancer; CKD (chronic kidney disease) stage 2, GFR 60-89 ml/min; Age-related osteoporosis without current pathological fracture; Neoplasm of brain causing mass effect and brain compression on adjacent structures ([place]); Primary CNS lymphoma ([place]); Status post stereotactic brain biopsy; B12 deficiency; Bowel and bladder incontinence; Lumbar back pain; Abdominal aortic atherosclerosis ([place]) - per PET 09/22/2021; Hypokalemia; CNS lymphoma ([place]); Leukocytosis; Vasogenic edema (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  She is married, 2 children, no grandchildren. She is a retired Pharmacist, hospital, various ages.  Husband [name] has memory issues, had significant decline over the last year and has been referred to neuro.  She is continuing to care for him with another caretaker who helps out and also drives her places.  Follows with Dr. 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  She had surgery completed 09/02/21 stereotactic biopsy by Dr. Marcello Moores; path B-cell lymphoma CD20+.  She continues treatment with Methotrexate and Leuocovorin, Rituximab for 21 days.  Recently diagnosed with Glioblasotma and had recent radiation therapy on 02/23/22 for whole brain radiation with right frontal boost with Dr. Isidore Moos. Tolerated tmt well overall.  She continues to have left sided weakness which is mild and stable, explainable from right frontal lobe tumor, tmt, radiation exposure.  Updated MRI revealed significant improvement within right frontal mass refractory to the high dose methotrexate.  No other progressive or concerning  findings were noted. Recently recommended on 03/17/22 f/u with Dr. Mickeal Skinner to decrease Decadron to 1  mg daily and ultimately discontinuing.  She was provided Gabapentin 100 mg BID for neuropathic pain. She will return in 3 mo for an updated MRI.  If left-sided weakness returns, consideration for re-imaging of her spine is noted.   She follows with Derm Dr. Pearline Cables annually for history of left cheek basal cell carcinoma.    She endorses right knee pain.  She is continuing PT and using a 4 prong walker, cane.  States that Gabapentin is helping.  She has had steroid injections in the past. She does not feel as though they were effective.   BMI is Body mass index is 25.23 kg/m., she has been working on diet and exercise Wt Readings from Last 3 Encounters:  04/01/22 147 lb (66.7 kg)  03/17/22 147 lb 4.8 oz (66.8 kg)  01/27/22 150 lb 3.2 oz (68.1 kg)   Her blood pressure has been controlled at home, she is on benicar '20mg'$ , today their BP is BP: 120/64 She does workout  She denies chest pain, shortness of breath, dizziness.   She is not on cholesterol medication and denies myalgias. Her cholesterol is at goal. The cholesterol last visit was:  Lab Results  Component Value Date   CHOL 182 09/23/2021   HDL 83 09/23/2021   LDLCALC 83 09/23/2021   TRIG 82 09/23/2021   CHOLHDL 2.2 09/23/2021    Has stable CKD II; last gfr:  Lab Results  Component Value Date   GFRNONAA >60 01/26/2022   Patient is on Vitamin D supplement, she stopped for several weeks and reduced from 4000 IU to 2000 IU daily  Lab Results  Component Value  Date   VD25OH >150 (H) 09/23/2021   She is on thyroid medication, cytomel 49mg (125m BID). Her medication was not changed last visit.   Lab Results  Component Value Date   TSH 0.493 01/20/2022    Medication Review  Current Outpatient Medications (Endocrine & Metabolic):    dexamethasone (DECADRON) 1 MG tablet, Take 1 tablet (1 mg total) by mouth daily.   liothyronine  (CYTOMEL) 5 MCG tablet, Take 5 mcg by mouth in the and at bedtime.  Current Outpatient Medications (Cardiovascular):    olmesartan (BENICAR) 20 MG tablet, Take 0.5 tablets (10 mg total) by mouth daily. Take 1/2 tablet  Daily  for BP   Current Outpatient Medications (Analgesics):    acetaminophen (TYLENOL) 500 MG tablet, Take 500 mg by mouth 2 (two) times daily as needed (pain).   Current Outpatient Medications (Other):    Ascorbic Acid (VITAMIN C PO), Take 1 tablet by mouth daily.   BIOTIN PO, Take 1 tablet by mouth 2 (two) times daily.   gabapentin (NEURONTIN) 100 MG capsule, Take 1 capsule (100 mg total) by mouth 2 (two) times daily.   MAGNESIUM PO, Take 1 capsule by mouth 2 (two) times daily.   Melatonin 10 MG TABS, Take 10 mg by mouth at bedtime.   Menthol, Topical Analgesic, (BIOFREEZE EX), Apply 1 Application topically at bedtime as needed (leg pain).   Misc Natural Products (OSTEO BI-FLEX JOINT SHIELD PO), Take 1 tablet by mouth 2 (two) times daily.   Multiple Vitamins-Minerals (ZINC PO), Take 1 tablet by mouth 2 (two) times daily.   Polyethyl Glycol-Propyl Glycol (SYSTANE) 0.4-0.3 % GEL ophthalmic gel, Place 1 Application into both eyes at bedtime.   TAURINE PO, Take 1 capsule by mouth daily.   tretinoin (RETIN-A) 0.05 % cream, Apply topically at bedtime. (Patient taking differently: Apply 1 application  topically See admin instructions. 1 application at bedtime on Monday, Wednesday, Friday)   famotidine (PEPCID) 20 MG tablet, Take 20 mg by mouth at bedtime as needed for indigestion. (Patient not taking: Reported on 04/01/2022)   fluconazole (DIFLUCAN) 100 MG tablet, Take 2 tablets today, then 1 tablet daily x 20 more days. (Patient not taking: Reported on 03/17/2022)   ondansetron (ZOFRAN) 8 MG tablet, Take 1 tablet (8 mg total) by mouth every 8 (eight) hours as needed for nausea or vomiting. May take 30-60 minutes prior to Temodar administration if nausea/vomiting occurs as needed.  (Patient not taking: Reported on 03/17/2022)   temozolomide (TEMODAR) 140 MG capsule, Take 2 capsules (280 mg total) by mouth daily. Take for 5 days, then off 23 days. May take on an empty stomach to decrease nausea & vomiting. (Patient not taking: Reported on 03/17/2022)  Current Problems (verified) Patient Active Problem List   Diagnosis Date Noted   Large B-cell lymphoma ([facility]10/17/2023   Normocytic anemia 01/21/2022   CNS lymphoma ([facility] [place]10/01/2022   Leukocytosis 01/20/2022   Vasogenic edema ([facility]/01/2022   Hypokalemia 12/05/2021   Abdominal aortic atherosclerosis ([place]- per PET 09/22/2021 09/24/2021   B12 deficiency 09/23/2021   Bowel and bladder incontinence 09/23/2021   Lumbar back pain 09/23/2021   Status post stereotactic brain biopsy 09/02/2021   Primary CNS lymphoma ([facility] [place]05/18/2023   Neoplasm of brain causing mass effect and brain compression on adjacent structures ([facility]05/17/2023   Age-related osteoporosis without current pathological fracture 10/10/2020   CKD (chronic kidney disease) stage 2, GFR 60-89 ml/min 03/29/2020   History of basal cell cancer 03/17/2018  Vitamin D deficiency 08/28/2014   Medication management 08/28/2014   Acquired hypothyroidism 08/28/2014   Essential hypertension 08/25/2013   Hyperlipidemia 08/25/2013   Migraines 08/25/2013   Rosacea 08/25/2013   Screening Test and Preventative care:  Immunization History  Administered Date(s) Administered   DT (Pediatric) 08/28/2014   Fluad Quad(high Dose 65+) 04/02/2021   Influenza, High Dose Seasonal PF 04/11/2019   PFIZER(Purple Top)SARS-COV-2 Vaccination 05/10/2019, 05/31/2019   Pneumococcal Conjugate-13 08/28/2015   Pneumococcal Polysaccharide-23 03/08/2017    Last colonoscopy: 11/2013 DONE Last mammogram: 11/07/2020 Due Last pap smear/pelvic exam: 2010, DONE DEXA: 10/11/2020 + osteoporosis t -2.6 < 2.8   Prior vaccinations: TD or Tdap: 2016  Influenza: Gets at end of month  03/2022 Pneumococcal: 2018 Prevnar 13: 2017 Shingles/Zostavax: holding off for now Covid 19: 2/2, 2021, pfizer - declines booster, had SE  Names of Other Physician/Practitioners you currently use: 1. [facility]- here for primary care 2. Dr. Herbert Deaner, eye doctor, last visit - s/p bilateral cataract surgery, last 2022 Also Following with Dr. Zigmund Daniel for retinal hemorrhage x Feb 27th, ? Mild improvement at 6 month follow up, will have follow up next month  3.  Microbiologist, last visit 2022  Patient Care Team: [name], MD as PCP - General (Internal Medicine) Izora Gala, MD as Consulting Physician (Otolaryngology) Michel Santee, MD as Consulting Physician (Neurology) [place] Nurse Navigator [facility])   Allergies Allergies  Allergen Reactions   Barbiturates Other (See Comments)    Unknown reaction   Other Other (See Comments)    Patient has AIP and is not to have hormones or red wine   Pentothal [Thiopental] Other (See Comments)    Unknown reaction   Sulfa Antibiotics Other (See Comments)    Unknown reaction    SURGICAL HISTORY She  has a past surgical history that includes Radial head arthroplasty (Right, 09/16/2012); Cataract extraction, bilateral (2020); Frameless  biopsy with brainlab (Right, 09/02/2021); Application of cranial navigation (Right, 09/02/2021); and IR IMAGING GUIDED PORT INSERTION (09/24/2021). FAMILY HISTORY Her family history includes Arthritis in her sister; Brain cancer (age of onset: 68) in her father; Cancer in her paternal grandfather; Heart disease in her mother; Hypertension in her sister and sister; Osteoporosis in her sister and sister; Scoliosis in her sister; Suicidality in her paternal grandfather. SOCIAL HISTORY She  reports that she quit smoking about 38 years ago. Her smoking use included cigarettes. She has a 44.00 pack-year smoking history.  She has never used smokeless tobacco. She reports that she does not currently use alcohol. She reports that she does not use drugs.  MEDICARE WELLNESS OBJECTIVES: Physical activity:   Cardiac risk factors:   Depression/mood screen:      04/01/2022    2:28 PM  Depression screen PHQ 2/9  Decreased Interest 0  Down, Depressed, Hopeless 2  PHQ - 2 Score 2    ADLs:     04/01/2022    2:27 PM 01/20/2022    2:15 PM  In your present state of health, do you have any difficulty performing the following activities:  Hearing? 0   Vision? 0   Difficulty concentrating or making decisions? 0   Walking or climbing stairs? 0   Doing errands, shopping? 0 0  Preparing Food and eating ? N   Using the Toilet? N   In the past six months, have you accidently leaked urine? N   Do you have problems with loss  of bowel control? N   Managing your Medications? N   Managing your Finances? N   Housekeeping or managing your Housekeeping? N      Cognitive Testing  Alert? Yes  Normal Appearance?Yes  Oriented to person? Yes  Place? Yes   Time? Yes  Recall of three objects?  Yes  Can perform simple calculations? Yes  Displays appropriate judgment?Yes  Can read the correct time from a watch face?Yes  EOL planning:       Review of Systems  Constitutional:  Negative for malaise/fatigue and weight loss.  HENT:  Negative for hearing loss and tinnitus.   Eyes:  Negative for blurred vision and double vision.  Respiratory:  Negative for cough, sputum production, shortness of breath and wheezing.   Cardiovascular:  Negative for chest pain, palpitations, orthopnea, claudication, leg swelling and PND.  Gastrointestinal:  Negative for abdominal pain, blood in stool, constipation, diarrhea, heartburn, melena, nausea and vomiting.  Genitourinary: Negative.   Musculoskeletal:  Negative for falls, joint pain and myalgias.  Skin:  Negative for rash.  Neurological:  Negative for dizziness, tingling, sensory  change, weakness and headaches.  Endo/Heme/Allergies:  Negative for polydipsia.  Psychiatric/Behavioral: Negative.  Negative for depression, memory loss, substance abuse and suicidal ideas. The patient is not nervous/anxious and does not have insomnia.   All other systems reviewed and are negative.    Objective:     Blood pressure 120/64, pulse (!) 58, temperature 97.7 F (36.5 C), height '5\' 4"'$  (1.626 m), weight 147 lb (66.7 kg), SpO2 98 %. Body mass index is 25.23 kg/m.  General appearance: alert, no distress, WD/WN,  female HEENT: normocephalic, sclerae anicteric, TMs pearly, nares patent, no discharge or erythema, pharynx normal Oral cavity: MMM, no lesions Neck: supple, no lymphadenopathy, no thyromegaly, no masses Heart: RRR, normal S1, S2, no murmurs Lungs: CTA bilaterally, no wheezes, rhonchi, or rales Abdomen: +bs, soft, non tender, non distended, no masses, no hepatomegaly, no splenomegaly Musculoskeletal: nontender, no swelling, no obvious deformity Extremities: no edema, no cyanosis, no clubbing Pulses: 2+ symmetric, upper and lower extremities, normal cap refill Neurological: alert, oriented x 3, CN2-12 intact, strength normal upper extremities and lower extremities, DTRs 2+ bilateral arms and legs, no cerebellar signs, gait normal Skin: sparse eyebrows bilaterally Psychiatric: normal affect, behavior normal, pleasant     Medicare Attestation I have personally reviewed: The patient's medical and social history Their use of alcohol, tobacco or illicit drugs Their current medications and supplements The patient's functional ability including ADLs,fall risks, home safety risks, cognitive, and hearing and visual impairment Diet and physical activities Evidence for depression or mood disorders  The patient's weight, height, BMI, and visual acuity have been recorded in the chart.  I have made referrals, counseling, and provided education to the patient based on review of  the above and I have provided the patient with a written personalized care plan for preventive services.      Darrol Jump, NP   04/01/2022

## 2022-04-02 ENCOUNTER — Encounter: Payer: Self-pay | Admitting: Internal Medicine

## 2022-04-02 LAB — CBC WITH DIFFERENTIAL/PLATELET
Absolute Monocytes: 521 cells/uL (ref 200–950)
Basophils Absolute: 53 cells/uL (ref 0–200)
Basophils Relative: 0.8 %
Eosinophils Absolute: 79 cells/uL (ref 15–500)
Eosinophils Relative: 1.2 %
HCT: 36.3 % (ref 35.0–45.0)
Hemoglobin: 12.4 g/dL (ref 11.7–15.5)
Lymphs Abs: 594 cells/uL — ABNORMAL LOW (ref 850–3900)
MCH: 32.5 pg (ref 27.0–33.0)
MCHC: 34.2 g/dL (ref 32.0–36.0)
MCV: 95 fL (ref 80.0–100.0)
MPV: 9.8 fL (ref 7.5–12.5)
Monocytes Relative: 7.9 %
Neutro Abs: 5353 cells/uL (ref 1500–7800)
Neutrophils Relative %: 81.1 %
Platelets: 190 10*3/uL (ref 140–400)
RBC: 3.82 10*6/uL (ref 3.80–5.10)
RDW: 13.1 % (ref 11.0–15.0)
Total Lymphocyte: 9 %
WBC: 6.6 10*3/uL (ref 3.8–10.8)

## 2022-04-02 LAB — COMPLETE METABOLIC PANEL WITH GFR
AG Ratio: 1.4 (calc) (ref 1.0–2.5)
ALT: 15 U/L (ref 6–29)
AST: 21 U/L (ref 10–35)
Albumin: 3.9 g/dL (ref 3.6–5.1)
Alkaline phosphatase (APISO): 40 U/L (ref 37–153)
BUN: 19 mg/dL (ref 7–25)
CO2: 30 mmol/L (ref 20–32)
Calcium: 9.6 mg/dL (ref 8.6–10.4)
Chloride: 102 mmol/L (ref 98–110)
Creat: 0.78 mg/dL (ref 0.60–0.95)
Globulin: 2.7 g/dL (calc) (ref 1.9–3.7)
Glucose, Bld: 99 mg/dL (ref 65–99)
Potassium: 4.5 mmol/L (ref 3.5–5.3)
Sodium: 141 mmol/L (ref 135–146)
Total Bilirubin: 0.8 mg/dL (ref 0.2–1.2)
Total Protein: 6.6 g/dL (ref 6.1–8.1)
eGFR: 75 mL/min/{1.73_m2} (ref >=60)

## 2022-04-02 LAB — LIPID PANEL
Cholesterol: 187 mg/dL (ref <200)
HDL: 76 mg/dL (ref >=50)
LDL Cholesterol (Calc): 94 mg/dL (calc)
Non-HDL Cholesterol (Calc): 111 mg/dL (calc) (ref <130)
Total CHOL/HDL Ratio: 2.5 (calc) (ref <5.0)
Triglycerides: 80 mg/dL (ref <150)

## 2022-04-02 LAB — TSH: TSH: 1.26 mIU/L (ref 0.40–4.50)

## 2022-04-02 LAB — VITAMIN D 25 HYDROXY (VIT D DEFICIENCY, FRACTURES): Vit D, 25-Hydroxy: 52 ng/mL (ref 30–100)

## 2022-04-11 DIAGNOSIS — N182 Chronic kidney disease, stage 2 (mild): Secondary | ICD-10-CM | POA: Diagnosis not present

## 2022-04-11 DIAGNOSIS — I129 Hypertensive chronic kidney disease with stage 1 through stage 4 chronic kidney disease, or unspecified chronic kidney disease: Secondary | ICD-10-CM | POA: Diagnosis not present

## 2022-04-11 DIAGNOSIS — C711 Malignant neoplasm of frontal lobe: Secondary | ICD-10-CM | POA: Diagnosis not present

## 2022-04-12 ENCOUNTER — Encounter: Payer: Self-pay | Admitting: Internal Medicine

## 2022-04-16 ENCOUNTER — Encounter: Payer: Self-pay | Admitting: Internal Medicine

## 2022-04-16 DIAGNOSIS — C8589 Other specified types of non-Hodgkin lymphoma, extranodal and solid organ sites: Secondary | ICD-10-CM

## 2022-04-20 DIAGNOSIS — S335XXD Sprain of ligaments of lumbar spine, subsequent encounter: Secondary | ICD-10-CM | POA: Diagnosis not present

## 2022-04-20 DIAGNOSIS — Z9181 History of falling: Secondary | ICD-10-CM | POA: Diagnosis not present

## 2022-04-20 DIAGNOSIS — R269 Unspecified abnormalities of gait and mobility: Secondary | ICD-10-CM | POA: Diagnosis not present

## 2022-04-20 DIAGNOSIS — M6281 Muscle weakness (generalized): Secondary | ICD-10-CM | POA: Diagnosis not present

## 2022-04-21 ENCOUNTER — Encounter (INDEPENDENT_AMBULATORY_CARE_PROVIDER_SITE_OTHER): Payer: Medicare PPO | Admitting: Ophthalmology

## 2022-04-21 DIAGNOSIS — I10 Essential (primary) hypertension: Secondary | ICD-10-CM | POA: Diagnosis not present

## 2022-04-21 DIAGNOSIS — H35033 Hypertensive retinopathy, bilateral: Secondary | ICD-10-CM

## 2022-04-21 DIAGNOSIS — H43813 Vitreous degeneration, bilateral: Secondary | ICD-10-CM | POA: Diagnosis not present

## 2022-04-21 DIAGNOSIS — H348322 Tributary (branch) retinal vein occlusion, left eye, stable: Secondary | ICD-10-CM

## 2022-04-22 ENCOUNTER — Other Ambulatory Visit: Payer: Self-pay

## 2022-04-22 NOTE — Progress Notes (Signed)
Ms. Mast presents today for follow up after completing radiation treatment for CNS Lymphoma. She completed treatment on 02-23-22.  Recent neurologic symptoms, if any:  Seizures: none Headaches: none Nausea: none Dizziness/ataxia: with getting up too fast Difficulty with hand coordination: have trouble typing with left hand Focal numbness/weakness: yes in legs and feet Visual deficits/changes: cannot read books the way she used to Confusion/Memory deficits: none  Other issues of note: pt has fallen (slid of bed), feels like no traction on feet/ toes still feel funny, pain in left leg (charlie horse??) right leg also,  Concerned with poop and urine smell, is it linked to medication. Urinating more at night.  Vitals:   05/01/22 1405  BP: 111/66  Pulse: 68  Resp: 20  Temp: 97.8 F (36.6 C)  SpO2: 98%

## 2022-04-28 ENCOUNTER — Ambulatory Visit: Payer: Self-pay | Admitting: Radiation Oncology

## 2022-04-30 ENCOUNTER — Telehealth: Payer: Self-pay | Admitting: *Deleted

## 2022-04-30 NOTE — Telephone Encounter (Signed)
Daughter Mendel Ryder called to see if Dr Mickeal Skinner felt it was appopriate for the patient to be signing legal documents.    Per Dr Mickeal Skinner he would need to complete a neuropsych evaluation here in office with the patient to determine if she is capable of those types of tasks and we can set that appointment up if she would like.  Left message for daughter to advise. Pending call back.

## 2022-05-01 ENCOUNTER — Encounter: Payer: Self-pay | Admitting: Radiation Oncology

## 2022-05-01 ENCOUNTER — Ambulatory Visit
Admission: RE | Admit: 2022-05-01 | Discharge: 2022-05-01 | Disposition: A | Discharge status: Home / Self Care | Payer: Medicare PPO | Source: Ambulatory Visit | Attending: Internal Medicine | Admitting: Internal Medicine

## 2022-05-01 ENCOUNTER — Ambulatory Visit
Admission: RE | Admit: 2022-05-01 | Discharge: 2022-05-01 | Disposition: A | Discharge status: Home / Self Care | Payer: Medicare PPO | Source: Ambulatory Visit | Attending: Radiation Oncology | Admitting: Radiation Oncology

## 2022-05-01 VITALS — BP 111/66 | HR 68 | Temp 97.8°F | Resp 20 | Ht 64.0 in | Wt 149.2 lb

## 2022-05-01 DIAGNOSIS — C8331 Diffuse large B-cell lymphoma, lymph nodes of head, face, and neck: Secondary | ICD-10-CM | POA: Insufficient documentation

## 2022-05-01 DIAGNOSIS — Z79630 Long term (current) use of alkylating agent: Secondary | ICD-10-CM | POA: Insufficient documentation

## 2022-05-01 DIAGNOSIS — Z923 Personal history of irradiation: Secondary | ICD-10-CM | POA: Insufficient documentation

## 2022-05-01 DIAGNOSIS — C8589 Other specified types of non-Hodgkin lymphoma, extranodal and solid organ sites: Secondary | ICD-10-CM

## 2022-05-01 DIAGNOSIS — R351 Nocturia: Secondary | ICD-10-CM | POA: Diagnosis not present

## 2022-05-01 DIAGNOSIS — Z79899 Other long term (current) drug therapy: Secondary | ICD-10-CM | POA: Diagnosis not present

## 2022-05-01 DIAGNOSIS — Z7952 Long term (current) use of systemic steroids: Secondary | ICD-10-CM | POA: Insufficient documentation

## 2022-05-01 DIAGNOSIS — C8510 Unspecified B-cell lymphoma, unspecified site: Secondary | ICD-10-CM

## 2022-05-01 LAB — URINALYSIS, ROUTINE W REFLEX MICROSCOPIC
Bacteria, UA: NONE SEEN
Bilirubin Urine: NEGATIVE
Glucose, UA: NEGATIVE mg/dL
Hgb urine dipstick: NEGATIVE
Ketones, ur: NEGATIVE mg/dL
Nitrite: NEGATIVE
Protein, ur: NEGATIVE mg/dL
Specific Gravity, Urine: 1.014 (ref 1.005–1.030)
pH: 7 (ref 5.0–8.0)

## 2022-05-01 MED ORDER — DEXAMETHASONE 0.5 MG PO TABS: 0.5000 mg | ORAL_TABLET | Freq: Every day | ORAL | 0 refills | Status: DC

## 2022-05-01 NOTE — Progress Notes (Signed)
Radiation Oncology         [phone]) [phone] ________________________________  Name: Daisy Oliver MRN: [id number]  Date: 05/01/2022  DOB: 04-12-1936  Follow-Up Visit Note  Outpatient  CC: Daisy Pinto, MD  Daisy Sellers, MD  Diagnosis and Prior Radiotherapy:    ICD-10-CM   1. 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 CNS lymphoma ([place])  C85.89       Radiation Treatment Dates: 01/29/2022 through 02/23/2022 Site Technique Total Dose (Gy) Dose per Fx (Gy) Completed Fx Beam Energies  Brain: Brain_Lymphom IMRT 39.1/39.1 2.3 17/17 6X    CHIEF COMPLAINT: Here for follow-up and surveillance of brain cancer  Narrative:  The patient returns today for routine follow-up.  Daisy Oliver presents today for follow up after completing radiation treatment for CNS Lymphoma. She completed treatment on 02-23-22.  Recent neurologic symptoms, if any:  Seizures: none Headaches: none Nausea: none Dizziness/ataxia: with getting up too fast Difficulty with hand coordination: have trouble typing with left hand Focal numbness/weakness: Denies numbness in her legs but does have decreased sensation in her feet. Visual deficits/changes: cannot read books the way she used to Confusion/Memory deficits: none  She denies back pain She still has taste changes  Other issues of note: pt has fallen (slid off bed), feels like no traction on feet/ toes still feel funny, pain in left leg (charlie horse??) right leg also, Concerned with urine smell, is it linked to medication. Urinating more at night.  Vitals:   05/01/22 1405  BP: 111/66  Pulse: 68  Resp: 20  Temp: 97.8 F (36.6 C)  SpO2: 98%                                ALLERGIES:  is allergic to barbiturates, other, pentothal [thiopental], and sulfa  antibiotics.  Meds: Current Outpatient Medications  Medication Sig Dispense Refill   acetaminophen (TYLENOL) 500 MG tablet Take 500 mg by mouth 2 (two) times daily as needed (pain).     Ascorbic Acid (VITAMIN C PO) Take 1 tablet by mouth daily.     BIOTIN PO Take 1 tablet by mouth 2 (two) times daily.     famotidine (PEPCID) 20 MG tablet Take 20 mg by mouth at bedtime as needed for indigestion.     gabapentin (NEURONTIN) 100 MG capsule Take 1 capsule (100 mg total) by mouth 2 (two) times daily. 60 capsule 3   liothyronine (CYTOMEL) 5 MCG tablet Take 5 mcg by mouth in the and at bedtime. 180 tablet 3   MAGNESIUM PO Take 1 capsule by mouth 2 (two) times daily.     Melatonin 10 MG TABS Take 10 mg by mouth at bedtime.     Menthol, Topical Analgesic, (BIOFREEZE EX) Apply 1 Application topically at bedtime as needed (leg pain).     Misc Natural Products (OSTEO BI-FLEX JOINT SHIELD PO) Take 1 tablet by mouth 2 (two) times daily.     Multiple Vitamins-Minerals (ZINC PO) Take 1 tablet by mouth 2 (two) times daily.     olmesartan (BENICAR) 20 MG tablet Take 0.5 tablets (10 mg total) by mouth daily. Take 1/2 tablet  Daily  for BP 45 tablet 3   Polyethyl Glycol-Propyl Glycol (SYSTANE) 0.4-0.3 %  GEL ophthalmic gel Place 1 Application into both eyes at bedtime.     TAURINE PO Take 1 capsule by mouth daily.     dexamethasone (DECADRON) 0.5 MG tablet Take 1 tablet (0.5 mg total) by mouth daily. 60 tablet 0   fluconazole (DIFLUCAN) 100 MG tablet Take 2 tablets today, then 1 tablet daily x 20 more days. (Patient not taking: Reported on 03/17/2022) 22 tablet 0   ondansetron (ZOFRAN) 8 MG tablet Take 1 tablet (8 mg total) by mouth every 8 (eight) hours as needed for nausea or vomiting. May take 30-60 minutes prior to Temodar administration if nausea/vomiting occurs as needed. (Patient not taking: Reported on 03/17/2022) 30 tablet 1   temozolomide (TEMODAR) 140 MG capsule Take 2 capsules (280 mg total) by mouth  daily. Take for 5 days, then off 23 days. May take on an empty stomach to decrease nausea & vomiting. (Patient not taking: Reported on 03/17/2022) 10 capsule 0   tretinoin (RETIN-A) 0.05 % cream Apply topically at bedtime. (Patient taking differently: Apply 1 application  topically See admin instructions. 1 application at bedtime on Monday, Wednesday, Friday) 45 g 3   No current facility-administered medications for this encounter.    Physical Findings: The patient is in no acute distress. Patient is alert and oriented.  height is '5\' 4"'$  (1.626 m) and weight is 149 lb 3.2 oz (67.7 kg). Her temperature is 97.8 F (36.6 C). Her blood pressure is 111/66 and her pulse is 68. Her respiration is 20 and oxygen saturation is 98%. .     General: Alert and oriented, in no acute distress HEENT: Head is normocephalic.  Alopecia.  Extraocular movements are intact.  Extremities: No cyanosis or edema.  No tenderness to palpation of calves Skin: No concerning lesions.  Musculoskeletal: symmetric strength and muscle tone throughout.  She does have difficulty rising from her chair and transferring to the lowered exam table but is able to do this independently. Neurologic: Cranial nerves II through XII are grossly intact. No obvious focalities. Speech is fluent. Coordination is intact. Psychiatric: Judgment and insight are intact. Affect is appropriate.   Lab Findings: Lab Results  Component Value Date   WBC 6.6 04/01/2022   HGB 12.4 04/01/2022   HCT 36.3 04/01/2022   MCV 95.0 04/01/2022   PLT 190 04/01/2022    Radiographic Findings: No results found.  Impression/Plan: CNS lymphoma - she is doing relatively well after completing whole brain radiation 2 months ago.  No signs of cognitive decline.    She is still taking dexamethasone 1 mg and she reports a recent fall as well as difficulty rising from her chair.  She is engaged in physical therapy intermittently as well as an exercise class  intermittently.  She denies any back pain or numbness in her legs.  My suspicion for spinal canal lymphoma is low but my suspicion for muscle atrophy in her leg muscles is high, especially given lack of activity and chronic steroid use.  We will reduce her dexamethasone steroid to 0.5 mg daily and she will follow-up with neuro-oncology in early March at which time she may be ready to stop this.  Also recommend more frequent physical therapy and a referral was made to her favorite physical therapist for this.  Also encouraged the patient and her caregiver to attend more chair exercise classes that are offered through [facility] health as the patient finds these enjoyable and helpful.  She states that she has an unusual sense to her  urine and we will order a urinalysis to rule out infection  Follow-up with neuro-oncology in early March with MRI of brain at that time.  I will see her in 6 months for physical exam, sooner as needed.  The patient and her caregiver expressed gratitude for our visit today.  On date of service, in total, I spent 40 minutes on this encounter. Patient was seen in person.  _____________________________________   Eppie Gibson, MD

## 2022-05-03 ENCOUNTER — Other Ambulatory Visit: Payer: Self-pay

## 2022-05-03 LAB — URINE CULTURE: Culture: <10000 — AB

## 2022-05-06 ENCOUNTER — Encounter: Payer: Self-pay | Admitting: Internal Medicine

## 2022-05-06 DIAGNOSIS — M79642 Pain in left hand: Secondary | ICD-10-CM | POA: Diagnosis not present

## 2022-05-06 DIAGNOSIS — S52124A Nondisplaced fracture of head of right radius, initial encounter for closed fracture: Secondary | ICD-10-CM | POA: Diagnosis not present

## 2022-05-07 DIAGNOSIS — H348322 Tributary (branch) retinal vein occlusion, left eye, stable: Secondary | ICD-10-CM | POA: Diagnosis not present

## 2022-05-07 DIAGNOSIS — Z79899 Other long term (current) drug therapy: Secondary | ICD-10-CM | POA: Diagnosis not present

## 2022-05-07 DIAGNOSIS — H524 Presbyopia: Secondary | ICD-10-CM | POA: Diagnosis not present

## 2022-05-07 DIAGNOSIS — H35033 Hypertensive retinopathy, bilateral: Secondary | ICD-10-CM | POA: Diagnosis not present

## 2022-05-07 DIAGNOSIS — H3562 Retinal hemorrhage, left eye: Secondary | ICD-10-CM | POA: Diagnosis not present

## 2022-05-08 ENCOUNTER — Telehealth: Payer: Self-pay | Admitting: *Deleted

## 2022-05-08 DIAGNOSIS — Z9181 History of falling: Secondary | ICD-10-CM | POA: Diagnosis not present

## 2022-05-08 DIAGNOSIS — S335XXD Sprain of ligaments of lumbar spine, subsequent encounter: Secondary | ICD-10-CM | POA: Diagnosis not present

## 2022-05-08 DIAGNOSIS — R269 Unspecified abnormalities of gait and mobility: Secondary | ICD-10-CM | POA: Diagnosis not present

## 2022-05-08 DIAGNOSIS — M6281 Muscle weakness (generalized): Secondary | ICD-10-CM | POA: Diagnosis not present

## 2022-05-08 DIAGNOSIS — C8589 Other specified types of non-Hodgkin lymphoma, extranodal and solid organ sites: Secondary | ICD-10-CM

## 2022-05-08 NOTE — Addendum Note (Signed)
Addended by: [name] on: 05/08/2022 04:22 PM   Modules accepted: Orders

## 2022-05-08 NOTE — Telephone Encounter (Signed)
Daughter states, Yes she would like to get a Psychiatric Consult per Dr Renda Rolls requirement to write the letter for her to be able to get POA.

## 2022-05-09 ENCOUNTER — Encounter: Payer: Self-pay | Admitting: Internal Medicine

## 2022-05-11 ENCOUNTER — Telehealth: Payer: Self-pay | Admitting: *Deleted

## 2022-05-11 DIAGNOSIS — C711 Malignant neoplasm of frontal lobe: Secondary | ICD-10-CM | POA: Diagnosis not present

## 2022-05-11 DIAGNOSIS — I129 Hypertensive chronic kidney disease with stage 1 through stage 4 chronic kidney disease, or unspecified chronic kidney disease: Secondary | ICD-10-CM | POA: Diagnosis not present

## 2022-05-11 DIAGNOSIS — N182 Chronic kidney disease, stage 2 (mild): Secondary | ICD-10-CM | POA: Diagnosis not present

## 2022-05-11 NOTE — Telephone Encounter (Signed)
Phoned [facility] to see if they would accept verbal order for OT evaluation for safety to drive if they have OT services there at their facility.  [phone]  Left message pending call back to advise.

## 2022-05-12 ENCOUNTER — Encounter: Payer: Self-pay | Admitting: *Deleted

## 2022-05-12 ENCOUNTER — Other Ambulatory Visit: Payer: Self-pay | Admitting: *Deleted

## 2022-05-12 DIAGNOSIS — C8589 Other specified types of non-Hodgkin lymphoma, extranodal and solid organ sites: Secondary | ICD-10-CM

## 2022-05-13 DIAGNOSIS — R269 Unspecified abnormalities of gait and mobility: Secondary | ICD-10-CM | POA: Diagnosis not present

## 2022-05-13 DIAGNOSIS — M6281 Muscle weakness (generalized): Secondary | ICD-10-CM | POA: Diagnosis not present

## 2022-05-13 DIAGNOSIS — I129 Hypertensive chronic kidney disease with stage 1 through stage 4 chronic kidney disease, or unspecified chronic kidney disease: Secondary | ICD-10-CM | POA: Diagnosis not present

## 2022-05-13 DIAGNOSIS — C711 Malignant neoplasm of frontal lobe: Secondary | ICD-10-CM | POA: Diagnosis not present

## 2022-05-13 DIAGNOSIS — S335XXD Sprain of ligaments of lumbar spine, subsequent encounter: Secondary | ICD-10-CM | POA: Diagnosis not present

## 2022-05-13 DIAGNOSIS — Z9181 History of falling: Secondary | ICD-10-CM | POA: Diagnosis not present

## 2022-05-13 DIAGNOSIS — N182 Chronic kidney disease, stage 2 (mild): Secondary | ICD-10-CM | POA: Diagnosis not present

## 2022-05-15 DIAGNOSIS — R269 Unspecified abnormalities of gait and mobility: Secondary | ICD-10-CM | POA: Diagnosis not present

## 2022-05-15 DIAGNOSIS — S335XXD Sprain of ligaments of lumbar spine, subsequent encounter: Secondary | ICD-10-CM | POA: Diagnosis not present

## 2022-05-15 DIAGNOSIS — Z9181 History of falling: Secondary | ICD-10-CM | POA: Diagnosis not present

## 2022-05-15 DIAGNOSIS — M6281 Muscle weakness (generalized): Secondary | ICD-10-CM | POA: Diagnosis not present

## 2022-05-19 ENCOUNTER — Encounter: Payer: Self-pay | Admitting: Internal Medicine

## 2022-05-19 DIAGNOSIS — M6281 Muscle weakness (generalized): Secondary | ICD-10-CM | POA: Diagnosis not present

## 2022-05-19 DIAGNOSIS — R269 Unspecified abnormalities of gait and mobility: Secondary | ICD-10-CM | POA: Diagnosis not present

## 2022-05-19 DIAGNOSIS — Z9181 History of falling: Secondary | ICD-10-CM | POA: Diagnosis not present

## 2022-05-19 DIAGNOSIS — S335XXD Sprain of ligaments of lumbar spine, subsequent encounter: Secondary | ICD-10-CM | POA: Diagnosis not present

## 2022-05-20 DIAGNOSIS — M6281 Muscle weakness (generalized): Secondary | ICD-10-CM | POA: Diagnosis not present

## 2022-05-20 DIAGNOSIS — M25642 Stiffness of left hand, not elsewhere classified: Secondary | ICD-10-CM | POA: Diagnosis not present

## 2022-05-21 ENCOUNTER — Telehealth: Payer: Self-pay

## 2022-05-21 NOTE — Progress Notes (Signed)
Review office visits, consults, Medication changes and Chronic conditions for Initial CCM visit with CPP on 05/26/21 @1$ :30PM.   PCP- 04/01/22 [place], NP (PCP) AWV - BP 120/64 HR 58. no med changes   Specialist-  03/17/22 Dr. Mickeal Skinner (Onc) F/U after brain MRI - BP 109/64 HR 66. Start: Gabapentin 145m 1 cap twice daily, Change: Dexamethasone 115m1 tab once daily.  01/26/22 Dr. VaMickeal SkinnerOnc) F/U after hospitalization - BP 119/65 HR 52. Change: Dexamethasone 36m636m tab once daily  01/01/22 Dr. VasMickeal Skinnernc) rituximab infusion - BP 114/60 HR 69. Change: Menthol topical analgesic 1 application topically at bedtime PRN. Discontinue: Emollient   12/23/21 Dr. VasMickeal Skinnernc) follow up - BP 121/68 HR 88. no med changes   Hospital visits in the last 6 months?  [facility]20[facility] CNS [facility]. Start: Dexamethasone 4mg26mtab once daily x14days  [facility] Change: Systane - Place 1 drop in both eyes at bedtime  Stop: Dexamethasone 2 MG tablet, Leucovorin 5 MG tablet, Preparation H 1-0.25-14.4-15 % Cream, Tramadol 50 MG tablet, Zinc Oxide 40 % Paste   Pre Call Questions: Date: 05/22/22 Time: AM Outcome: Successful Confirmed appointment date/time with patient/caregiver? Yes  Date/time of the appointment: 2/13 @1PM$  Visit type: Clinic Patient instructed to bring medications to appointment: Yes What, if any, problems do you have getting your medications from the pharmacy? none What is your top health concern to discuss at your upcoming visit? none     Time Spent: 40 [place]on [place]

## 2022-05-26 ENCOUNTER — Other Ambulatory Visit: Payer: Self-pay | Admitting: Pharmacist

## 2022-05-26 ENCOUNTER — Ambulatory Visit: Payer: Medicare PPO | Admitting: Pharmacy Technician

## 2022-05-27 ENCOUNTER — Other Ambulatory Visit: Payer: Self-pay | Admitting: Internal Medicine

## 2022-05-27 DIAGNOSIS — Z1231 Encounter for screening mammogram for malignant neoplasm of breast: Secondary | ICD-10-CM

## 2022-05-28 ENCOUNTER — Telehealth: Payer: Self-pay

## 2022-05-28 NOTE — Progress Notes (Signed)
Per CPP patient is interested in getting some information and resources to get help with her husband as he has dementia and she is already going through a lot herself. I have found some resources and spoke with patient and she request that I email them to her. I have found in home aid by social services [phone]) and [facility] [phone]). I also sent her an online support and community for CNS lymphoma. Advised her to call if she has any questions or any additional support.   Time Spent: 52mn.  EHildred Alamin CTL

## 2022-06-04 DIAGNOSIS — M6281 Muscle weakness (generalized): Secondary | ICD-10-CM | POA: Diagnosis not present

## 2022-06-04 DIAGNOSIS — M25642 Stiffness of left hand, not elsewhere classified: Secondary | ICD-10-CM | POA: Diagnosis not present

## 2022-06-08 ENCOUNTER — Other Ambulatory Visit: Payer: Self-pay | Admitting: Radiation Therapy

## 2022-06-08 DIAGNOSIS — M25642 Stiffness of left hand, not elsewhere classified: Secondary | ICD-10-CM | POA: Diagnosis not present

## 2022-06-08 DIAGNOSIS — M6281 Muscle weakness (generalized): Secondary | ICD-10-CM | POA: Diagnosis not present

## 2022-06-10 ENCOUNTER — Telehealth: Payer: Self-pay | Admitting: *Deleted

## 2022-06-10 ENCOUNTER — Encounter: Payer: Self-pay | Admitting: Internal Medicine

## 2022-06-10 DIAGNOSIS — C8589 Other specified types of non-Hodgkin lymphoma, extranodal and solid organ sites: Secondary | ICD-10-CM

## 2022-06-10 NOTE — Telephone Encounter (Signed)
Daughter called to follow up on referral for psych eval request. States they have not heard anything.

## 2022-06-11 ENCOUNTER — Ambulatory Visit (HOSPITAL_COMMUNITY)
Admission: RE | Admit: 2022-06-11 | Discharge: 2022-06-11 | Disposition: A | Discharge status: Home / Self Care | Payer: Medicare PPO | Source: Ambulatory Visit | Attending: Internal Medicine | Admitting: Internal Medicine

## 2022-06-11 DIAGNOSIS — C719 Malignant neoplasm of brain, unspecified: Secondary | ICD-10-CM | POA: Diagnosis not present

## 2022-06-11 DIAGNOSIS — I129 Hypertensive chronic kidney disease with stage 1 through stage 4 chronic kidney disease, or unspecified chronic kidney disease: Secondary | ICD-10-CM | POA: Diagnosis not present

## 2022-06-11 DIAGNOSIS — N182 Chronic kidney disease, stage 2 (mild): Secondary | ICD-10-CM | POA: Diagnosis not present

## 2022-06-11 DIAGNOSIS — C8589 Other specified types of non-Hodgkin lymphoma, extranodal and solid organ sites: Secondary | ICD-10-CM | POA: Insufficient documentation

## 2022-06-11 DIAGNOSIS — C711 Malignant neoplasm of frontal lobe: Secondary | ICD-10-CM | POA: Diagnosis not present

## 2022-06-11 MED ORDER — HEPARIN SOD (PORK) LOCK FLUSH 100 UNIT/ML IV SOLN
500.0000 [IU] | Freq: Once | INTRAVENOUS | Status: AC
  Administered 2022-06-11: 500 [IU] via INTRAVENOUS

## 2022-06-11 MED ORDER — HEPARIN SOD (PORK) LOCK FLUSH 100 UNIT/ML IV SOLN
INTRAVENOUS | Status: AC
  Filled 2022-06-11: qty 5

## 2022-06-11 MED ORDER — GADOBUTROL 1 MMOL/ML IV SOLN
6.0000 mL | Freq: Once | INTRAVENOUS | Status: AC | PRN
  Administered 2022-06-11: 6 mL via INTRAVENOUS

## 2022-06-11 NOTE — Addendum Note (Signed)
Addended by: [name] on: 06/11/2022 09:14 AM   Modules accepted: Orders

## 2022-06-12 DIAGNOSIS — M545 Low back pain, unspecified: Secondary | ICD-10-CM | POA: Diagnosis not present

## 2022-06-12 DIAGNOSIS — M25521 Pain in right elbow: Secondary | ICD-10-CM | POA: Diagnosis not present

## 2022-06-15 ENCOUNTER — Inpatient Hospital Stay: Payer: Medicare PPO | Attending: Neurosurgery

## 2022-06-15 ENCOUNTER — Encounter: Payer: Self-pay | Admitting: Internal Medicine

## 2022-06-15 DIAGNOSIS — Z8249 Family history of ischemic heart disease and other diseases of the circulatory system: Secondary | ICD-10-CM | POA: Insufficient documentation

## 2022-06-15 DIAGNOSIS — R200 Anesthesia of skin: Secondary | ICD-10-CM | POA: Insufficient documentation

## 2022-06-15 DIAGNOSIS — Z818 Family history of other mental and behavioral disorders: Secondary | ICD-10-CM | POA: Insufficient documentation

## 2022-06-15 DIAGNOSIS — C8339 Diffuse large B-cell lymphoma, extranodal and solid organ sites: Secondary | ICD-10-CM | POA: Insufficient documentation

## 2022-06-15 DIAGNOSIS — R202 Paresthesia of skin: Secondary | ICD-10-CM | POA: Insufficient documentation

## 2022-06-15 DIAGNOSIS — Z8616 Personal history of COVID-19: Secondary | ICD-10-CM | POA: Insufficient documentation

## 2022-06-15 DIAGNOSIS — Z808 Family history of malignant neoplasm of other organs or systems: Secondary | ICD-10-CM | POA: Insufficient documentation

## 2022-06-15 DIAGNOSIS — E039 Hypothyroidism, unspecified: Secondary | ICD-10-CM | POA: Insufficient documentation

## 2022-06-15 DIAGNOSIS — R2689 Other abnormalities of gait and mobility: Secondary | ICD-10-CM | POA: Insufficient documentation

## 2022-06-15 DIAGNOSIS — I10 Essential (primary) hypertension: Secondary | ICD-10-CM | POA: Insufficient documentation

## 2022-06-15 DIAGNOSIS — Z8269 Family history of other diseases of the musculoskeletal system and connective tissue: Secondary | ICD-10-CM | POA: Insufficient documentation

## 2022-06-15 DIAGNOSIS — R2681 Unsteadiness on feet: Secondary | ICD-10-CM | POA: Insufficient documentation

## 2022-06-15 DIAGNOSIS — Z8262 Family history of osteoporosis: Secondary | ICD-10-CM | POA: Insufficient documentation

## 2022-06-15 DIAGNOSIS — M545 Low back pain, unspecified: Secondary | ICD-10-CM | POA: Insufficient documentation

## 2022-06-15 DIAGNOSIS — Z882 Allergy status to sulfonamides status: Secondary | ICD-10-CM | POA: Insufficient documentation

## 2022-06-15 DIAGNOSIS — Z7952 Long term (current) use of systemic steroids: Secondary | ICD-10-CM | POA: Insufficient documentation

## 2022-06-15 DIAGNOSIS — Z79899 Other long term (current) drug therapy: Secondary | ICD-10-CM | POA: Insufficient documentation

## 2022-06-15 DIAGNOSIS — Z79630 Long term (current) use of alkylating agent: Secondary | ICD-10-CM | POA: Insufficient documentation

## 2022-06-15 DIAGNOSIS — Z809 Family history of malignant neoplasm, unspecified: Secondary | ICD-10-CM | POA: Insufficient documentation

## 2022-06-15 DIAGNOSIS — M25561 Pain in right knee: Secondary | ICD-10-CM | POA: Insufficient documentation

## 2022-06-16 ENCOUNTER — Telehealth: Payer: Self-pay | Admitting: Internal Medicine

## 2022-06-16 ENCOUNTER — Inpatient Hospital Stay (HOSPITAL_BASED_OUTPATIENT_CLINIC_OR_DEPARTMENT_OTHER): Payer: Medicare PPO | Admitting: Internal Medicine

## 2022-06-16 VITALS — BP 102/57 | HR 70 | Temp 97.0°F | Resp 15 | Wt 147.6 lb

## 2022-06-16 DIAGNOSIS — Z8262 Family history of osteoporosis: Secondary | ICD-10-CM | POA: Diagnosis not present

## 2022-06-16 DIAGNOSIS — M545 Low back pain, unspecified: Secondary | ICD-10-CM | POA: Diagnosis not present

## 2022-06-16 DIAGNOSIS — Z79630 Long term (current) use of alkylating agent: Secondary | ICD-10-CM | POA: Diagnosis not present

## 2022-06-16 DIAGNOSIS — C8589 Other specified types of non-Hodgkin lymphoma, extranodal and solid organ sites: Secondary | ICD-10-CM | POA: Diagnosis not present

## 2022-06-16 DIAGNOSIS — Z809 Family history of malignant neoplasm, unspecified: Secondary | ICD-10-CM | POA: Diagnosis not present

## 2022-06-16 DIAGNOSIS — Z79899 Other long term (current) drug therapy: Secondary | ICD-10-CM | POA: Diagnosis not present

## 2022-06-16 DIAGNOSIS — R2681 Unsteadiness on feet: Secondary | ICD-10-CM | POA: Diagnosis not present

## 2022-06-16 DIAGNOSIS — Z808 Family history of malignant neoplasm of other organs or systems: Secondary | ICD-10-CM | POA: Diagnosis not present

## 2022-06-16 DIAGNOSIS — I10 Essential (primary) hypertension: Secondary | ICD-10-CM | POA: Diagnosis not present

## 2022-06-16 DIAGNOSIS — Z8616 Personal history of COVID-19: Secondary | ICD-10-CM | POA: Diagnosis not present

## 2022-06-16 DIAGNOSIS — Z818 Family history of other mental and behavioral disorders: Secondary | ICD-10-CM | POA: Diagnosis not present

## 2022-06-16 DIAGNOSIS — Z7952 Long term (current) use of systemic steroids: Secondary | ICD-10-CM | POA: Diagnosis not present

## 2022-06-16 DIAGNOSIS — Z8249 Family history of ischemic heart disease and other diseases of the circulatory system: Secondary | ICD-10-CM | POA: Diagnosis not present

## 2022-06-16 DIAGNOSIS — R2689 Other abnormalities of gait and mobility: Secondary | ICD-10-CM | POA: Diagnosis not present

## 2022-06-16 DIAGNOSIS — E039 Hypothyroidism, unspecified: Secondary | ICD-10-CM | POA: Diagnosis not present

## 2022-06-16 DIAGNOSIS — M25561 Pain in right knee: Secondary | ICD-10-CM | POA: Diagnosis not present

## 2022-06-16 DIAGNOSIS — Z8269 Family history of other diseases of the musculoskeletal system and connective tissue: Secondary | ICD-10-CM | POA: Diagnosis not present

## 2022-06-16 DIAGNOSIS — C8339 Diffuse large B-cell lymphoma, extranodal and solid organ sites: Secondary | ICD-10-CM | POA: Diagnosis not present

## 2022-06-16 DIAGNOSIS — R200 Anesthesia of skin: Secondary | ICD-10-CM | POA: Diagnosis not present

## 2022-06-16 DIAGNOSIS — R202 Paresthesia of skin: Secondary | ICD-10-CM | POA: Diagnosis not present

## 2022-06-16 DIAGNOSIS — Z882 Allergy status to sulfonamides status: Secondary | ICD-10-CM | POA: Diagnosis not present

## 2022-06-16 NOTE — Progress Notes (Signed)
Per order placed by Dr Mickeal Skinner, I submitted online request for [facility] to reach out to [place] to schedule neuro cognitive evaluation for this patient per previous discussion with Dr Mickeal Skinner.   Pending response.

## 2022-06-16 NOTE — Progress Notes (Signed)
[facility] [address] [place], [place] [address] [phone]   Interval Evaluation  Date of Service: 06/16/22 Patient Name: Daisy Oliver Patient MRN: [id number] Patient DOB: [birth year] Provider: [name], MD  Identifying Statement:  [name] is a 87 y.o. female with right frontal  CNS lymphoma    Oncologic History: Oncology History  Primary CNS lymphoma ([place])  09/02/2021 Surgery   Stereotactic biopsy by Dr. Marcello Moores; path is B-cell lymphoma CD20+   09/30/2021 - 12/05/2021 Chemotherapy   Patient is on Treatment Plan : IP NON-HODGKINS LYMPHOMA High Dose Methotrexate + Leucovorin Rescue     10/02/2021 - 01/01/2022 Chemotherapy   Patient is on Treatment Plan : NON-HODGKINS LYMPHOMA Rituximab q21d     01/01/2022 -  Chemotherapy   Patient is on Treatment Plan : BRAIN GLIOBLASTOMA Consolidation Temozolomide Days 1-5 q28 Days      01/29/2022 - 02/23/2022 Radiation Therapy   Whole brain radiation with right frontal boost with Dr. Isidore Moos     Biomarkers:  CD20 positive .  Ki-67 50% .   Unknown   Unknown   Interval History: Daisy Oliver presents today for follow up after recent MRI brain.  No new or progressive changes today, aside from chronic orthopedic issues. Currently independent with gait and functional status, is utilizing a cane most of the time.  Right knee remains in pain, also limiting gait.  Continues to describe numbness, tingling of bottoms of feet since chemo.  H+P (09/16/21) Patient presented to medical attention in May 2023 with several weeks of progressive gait instability.  She describes feeling unsteady on her feet, even falling "a couple of times".  After one of the falls, ED visit led to CNS imaging which demonstrated multifocal masses in the right hemisphere.  She underwent stereotactic biopsy with Dr. Marcello Moores on 09/02/21; path demonstrated diffuse large B-cell lymphoma, CD20+.  Since surgery,  she has tapered off the steroids without issue.  She feels "fairly normal" aside from pain in her knee and lower back.  She uses a cane to walk, mainly because of the knee pain.  No issues with frank weakness, no seizures.  She lives at home with her husband who has dementia.  Daughter is nearby in [place].  Medications: Current Outpatient Medications on File Prior to Visit  Medication Sig Dispense Refill   acetaminophen (TYLENOL) 500 MG tablet Take 500 mg by mouth 2 (two) times daily as needed (pain).     Ascorbic Acid (VITAMIN C PO) Take 1 tablet by mouth daily.     BIOTIN PO Take 1 tablet by mouth 2 (two) times daily.     dexamethasone (DECADRON) 0.5 MG tablet Take 1 tablet (0.5 mg total) by mouth daily. 60 tablet 0   famotidine (PEPCID) 20 MG tablet Take 20 mg by mouth at bedtime as needed for indigestion.     gabapentin (NEURONTIN) 100 MG capsule Take 1 capsule (100 mg total) by mouth 2 (two) times daily. 60 capsule 3   liothyronine (CYTOMEL) 5 MCG tablet Take 5 mcg by mouth in the and at bedtime. 180 tablet 3   MAGNESIUM PO Take 1 capsule by mouth 2 (two) times daily.     Melatonin 10 MG TABS Take 10 mg by mouth at bedtime.     Menthol, Topical Analgesic, (BIOFREEZE EX) Apply 1 Application topically at bedtime as needed (leg pain).     Misc Natural Products (OSTEO BI-FLEX JOINT SHIELD PO) Take  1 tablet by mouth 2 (two) times daily.     Multiple Vitamins-Minerals (ZINC PO) Take 1 tablet by mouth 2 (two) times daily.     olmesartan (BENICAR) 20 MG tablet Take 0.5 tablets (10 mg total) by mouth daily. Take 1/2 tablet  Daily  for BP 45 tablet 3   ondansetron (ZOFRAN) 8 MG tablet Take 1 tablet (8 mg total) by mouth every 8 (eight) hours as needed for nausea or vomiting. May take 30-60 minutes prior to Temodar administration if nausea/vomiting occurs as needed. (Patient not taking: Reported on 03/17/2022) 30 tablet 1   Polyethyl Glycol-Propyl Glycol (SYSTANE) 0.4-0.3 % GEL ophthalmic gel Place 1  Application into both eyes at bedtime.     TAURINE PO Take 1 capsule by mouth daily.     temozolomide (TEMODAR) 140 MG capsule Take 2 capsules (280 mg total) by mouth daily. Take for 5 days, then off 23 days. May take on an empty stomach to decrease nausea & vomiting. (Patient not taking: Reported on 03/17/2022) 10 capsule 0   tretinoin (RETIN-A) 0.05 % cream Apply topically at bedtime. (Patient taking differently: Apply 1 application  topically See admin instructions. 1 application at bedtime on Monday, Wednesday, Friday) 45 g 3   No current facility-administered medications on file prior to visit.    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 at age 28   Cancer [facility])    CNS lymphoma ([place])    COVID-19 08/26/2020   Fracture of radial head, right, closed 09/16/2012   GERD (gastroesophageal reflux disease)    pepcid prn   Hypertension    Hypothyroidism    PONV (postoperative nausea and vomiting)    Past Surgical History:  Past Surgical History:  Procedure Laterality Date   APPLICATION OF CRANIAL NAVIGATION Right 09/02/2021   Procedure: APPLICATION OF CRANIAL NAVIGATION;  Surgeon: Vallarie Mare, MD;  Location: [facility];  Service: Neurosurgery;  Laterality: Right;   CATARACT EXTRACTION, BILATERAL  2020   Dr. Herbert Deaner   FRAMELESS  BIOPSY WITH BRAINLAB Right 09/02/2021   Procedure: FRONTAL STEREOTACTIC BRAIN BIOPSY;  Surgeon: Vallarie Mare, MD;  Location: [facility];  Service: Neurosurgery;  Laterality: Right;   IR IMAGING GUIDED PORT INSERTION  09/24/2021   RADIAL HEAD ARTHROPLASTY Right 09/16/2012    Procedure: RADIAL HEAD ARTHROPLASTY;  Surgeon: Johnny Bridge, MD;  Location: [facility];  Service: Orthopedics;  Laterality: Right;  RADIAL HEAD REPLACEMENT     Social History:  Social History   Socioeconomic History   Marital status: Married    Spouse name: Not on file   Number of children: 2   Years of education: Not on file   Highest education level: Not on file  Occupational History   Not on file  Tobacco Use   Smoking status: Former    Packs/day: 2.00    Years: 22.00    Total pack years: 44.00    Types: Cigarettes    Quit date: 09/17/1983    Years since quitting: 38.7   Smokeless tobacco: Never  Vaping Use   Vaping Use: Never used  Substance and Sexual Activity   Alcohol use: Not Currently    Alcohol/week: 0.0 - 1.0 standard drinks of alcohol    Comment: 2/month   Drug use: No   Sexual activity: Not Currently  Other Topics Concern   Not on file  Social History Narrative   Not on file   Social Determinants of Health   Financial Resource Strain: Not on file  Food Insecurity: No Food Insecurity (01/20/2022)   Hunger Vital Sign    Worried About Running Out of Food in the Last Year: Never true    Ran Out of Food in the Last Year: Never true  Transportation Needs: No Transportation Needs (01/20/2022)   PRAPARE - Hydrologist (Medical): No    Lack of Transportation (Non-Medical): No  Physical Activity: Sufficiently Active (09/14/2017)   Exercise Vital Sign    Days of Exercise per Week: 5 days    Minutes of Exercise per Session: 60 min  Stress: No Stress Concern Present (09/14/2017)   [place]    Feeling of Stress : Only a little  Social Connections: Not on file  Intimate Partner Violence: Not At Risk (01/20/2022)   Humiliation, Afraid, Rape, and Kick questionnaire    Fear of Current or Ex-Partner: No    Emotionally Abused: No    Physically Abused: No     Sexually Abused: No   Family History:  Family History  Problem Relation Age of Onset   Heart disease Mother    Brain cancer Father 35       Brain tumor, not cancerous   Hypertension Sister    Osteoporosis Sister    Arthritis Sister    Scoliosis Sister    Hypertension Sister    Osteoporosis Sister    Cancer Paternal Grandfather    Suicidality Paternal Grandfather    Breast cancer Neg Hx     Review of Systems: Constitutional: Doesn't report fevers, chills or abnormal weight loss Eyes: Doesn't report blurriness of vision Ears, nose, mouth, throat, and face: Doesn't report sore throat Respiratory: Doesn't report cough, dyspnea or wheezes Cardiovascular: Doesn't report palpitation, chest discomfort  Gastrointestinal:  Doesn't report nausea, constipation, diarrhea GU: Doesn't report incontinence Skin: Doesn't report skin rashes Neurological: Per HPI Musculoskeletal: Doesn't report joint pain Behavioral/Psych: Doesn't report anxiety  Physical Exam: Vitals:   06/16/22 0941  BP: (!) 102/57  Pulse: 70  Resp: 15  Temp: (!) 97 F (36.1 C)  SpO2: 92%    KPS: 80. General: Alert, cooperative, pleasant, in no acute distress Head: Normal EENT: No conjunctival injection or scleral icterus.  Lungs: Resp effort normal Cardiac: Regular rate Abdomen: Non-distended abdomen Skin: No rashes cyanosis or petechiae. Extremities: No clubbing or edema  Neurologic Exam: Mental Status: Awake, alert, attentive to examiner. Oriented to self and environment. Language is fluent with intact comprehension.  Cranial Nerves: Visual acuity is grossly normal. Visual fields are full. Extra-ocular movements intact. No ptosis. Face is symmetric Motor: Tone and bulk are normal. Power is full in both arms and legs, with subtle drift noted in left arm. Reflexes are symmetric, no pathologic reflexes present.  Sensory: Intact to light touch Gait: Orthopedic limitation, cane assisted   Labs: I have  reviewed the data as listed    Component Value Date/Time   NA 141 04/01/2022 1444   K 4.5 04/01/2022 1444   CL 102 04/01/2022 1444  CO2 30 04/01/2022 1444   GLUCOSE 99 04/01/2022 1444   BUN 19 04/01/2022 1444   CREATININE 0.78 04/01/2022 1444   CALCIUM 9.6 04/01/2022 1444   PROT 6.6 04/01/2022 1444   ALBUMIN 3.7 01/26/2022 1100   AST 21 04/01/2022 1444   AST 23 01/26/2022 1100   ALT 15 04/01/2022 1444   ALT 23 01/26/2022 1100   ALKPHOS 46 01/26/2022 1100   BILITOT 0.8 04/01/2022 1444   BILITOT 0.6 01/26/2022 1100   GFRNONAA >60 01/26/2022 1100   GFRNONAA 66 09/19/2020 0000   GFRAA 76 09/19/2020 0000   Lab Results  Component Value Date   WBC 6.6 04/01/2022   NEUTROABS 5,353 04/01/2022   HGB 12.4 04/01/2022   HCT 36.3 04/01/2022   MCV 95.0 04/01/2022   PLT 190 04/01/2022    Imaging:  [place] Clinician Interpretation: I have personally reviewed the CNS images as listed.  My interpretation, in the context of the patient's clinical presentation, is stable disease  MR BRAIN W WO CONTRAST  Result Date: 06/12/2022 CLINICAL DATA:  Brain/CNS neoplasm, assess treatment response EXAM: MRI HEAD WITHOUT AND WITH CONTRAST TECHNIQUE: Multiplanar, multiecho pulse sequences of the brain and surrounding structures were obtained without and with intravenous contrast. CONTRAST:  62m GADAVIST GADOBUTROL 1 MMOL/ML IV SOLN COMPARISON:  MRI head March 12, 2022. FINDINGS: Brain: Continued decrease in conspicuity of right frontal and right insular enhancement. The degree of T2/FLAIR hyperintensity in these regions rib is not substantially changed. No new pathologic enhancement elsewhere. No significant mass effect. No evidence of acute infarct, acute hemorrhage, midline shift or hydrocephalus. Vascular: Major arterial flow voids are maintained at the skull base. Skull and upper cervical spine: Normal marrow signal. Sinuses/Orbits: Largely clear sinuses.  No acute orbital findings. Other: No sizable  mastoid effusions. IMPRESSION: Continued decrease in conspicuity of enhancement, compatible with continued treatment response. Electronically Signed   By: FMargaretha SheffieldM.D.   On: 06/12/2022 08:25      Assessment/Plan Primary CNS lymphoma ([place]  LRemonia RichterWBrandliis clinically stable today.  MRI brain demonstrates stable findings.  No progression in any neurologic deficit.  Ok with OT driving evaluation, per discussion.  We ask that LJosephina Gipreturn to clinic in 3 months with MRI brain for evaluation.    All questions were answered. The patient knows to call the clinic with any problems, questions or concerns. No barriers to learning were detected.  The total time spent in the encounter was 40 minutes and more than 50% was on counseling and review of test results   ZVentura Sellers MD Medical Director of Neuro-Oncology [facility]at [facility]03/05/24 9:38 AM

## 2022-06-16 NOTE — Telephone Encounter (Signed)
Patient called to schedule appointment.

## 2022-06-17 ENCOUNTER — Other Ambulatory Visit: Payer: Self-pay | Admitting: Radiation Therapy

## 2022-06-18 DIAGNOSIS — M25642 Stiffness of left hand, not elsewhere classified: Secondary | ICD-10-CM | POA: Diagnosis not present

## 2022-06-18 DIAGNOSIS — M6281 Muscle weakness (generalized): Secondary | ICD-10-CM | POA: Diagnosis not present

## 2022-06-25 ENCOUNTER — Other Ambulatory Visit: Payer: Self-pay | Admitting: Nurse Practitioner

## 2022-06-25 ENCOUNTER — Encounter: Payer: Self-pay | Admitting: Nurse Practitioner

## 2022-06-25 DIAGNOSIS — M6281 Muscle weakness (generalized): Secondary | ICD-10-CM | POA: Diagnosis not present

## 2022-06-25 DIAGNOSIS — R21 Rash and other nonspecific skin eruption: Secondary | ICD-10-CM

## 2022-06-25 DIAGNOSIS — M25642 Stiffness of left hand, not elsewhere classified: Secondary | ICD-10-CM | POA: Diagnosis not present

## 2022-06-25 MED ORDER — TRIAMCINOLONE ACETONIDE 0.025 % EX OINT: 1.0000 | TOPICAL_OINTMENT | Freq: Two times a day (BID) | CUTANEOUS | 0 refills | Status: DC

## 2022-06-25 NOTE — Progress Notes (Signed)
Received a faxed documentation message from [facility] Jeanne Ivan RN, with [facility], home based palliative care. Contact [phone].  RN states states that she saw patient this week for a visit in her home based palliative care program. She was noted to have a rash on the back of her neck that is very inflamed. It is raised in red combat approximately 2 inches by 2 inches with additional area of patchy redness extending about three inches down her back. She states that it is very itchy. Her caregiver states that it is worse than it has been. This started about a month ago. She has been applying over the counters there of a lotion but has not tried any of the tropical treatment.  Will start tmt with Triamcinolone cream and Continue to monitor

## 2022-06-29 DIAGNOSIS — I129 Hypertensive chronic kidney disease with stage 1 through stage 4 chronic kidney disease, or unspecified chronic kidney disease: Secondary | ICD-10-CM | POA: Diagnosis not present

## 2022-06-29 DIAGNOSIS — N182 Chronic kidney disease, stage 2 (mild): Secondary | ICD-10-CM | POA: Diagnosis not present

## 2022-06-29 DIAGNOSIS — C711 Malignant neoplasm of frontal lobe: Secondary | ICD-10-CM | POA: Diagnosis not present

## 2022-07-01 ENCOUNTER — Ambulatory Visit: Payer: Medicare PPO | Admitting: Internal Medicine

## 2022-07-07 ENCOUNTER — Encounter: Payer: Self-pay | Admitting: Internal Medicine

## 2022-07-08 ENCOUNTER — Ambulatory Visit: Payer: Medicare PPO | Admitting: Internal Medicine

## 2022-07-13 ENCOUNTER — Ambulatory Visit
Admission: RE | Admit: 2022-07-13 | Discharge: 2022-07-13 | Disposition: A | Discharge status: Home / Self Care | Payer: Medicare PPO | Source: Ambulatory Visit | Attending: Internal Medicine | Admitting: Internal Medicine

## 2022-07-13 DIAGNOSIS — H524 Presbyopia: Secondary | ICD-10-CM | POA: Diagnosis not present

## 2022-07-13 DIAGNOSIS — Z1231 Encounter for screening mammogram for malignant neoplasm of breast: Secondary | ICD-10-CM | POA: Diagnosis not present

## 2022-07-20 ENCOUNTER — Telehealth: Payer: Self-pay

## 2022-07-20 ENCOUNTER — Encounter: Payer: Self-pay | Admitting: *Deleted

## 2022-07-20 ENCOUNTER — Other Ambulatory Visit: Payer: Self-pay | Admitting: *Deleted

## 2022-07-20 DIAGNOSIS — M6281 Muscle weakness (generalized): Secondary | ICD-10-CM | POA: Diagnosis not present

## 2022-07-20 DIAGNOSIS — C8589 Other specified types of non-Hodgkin lymphoma, extranodal and solid organ sites: Secondary | ICD-10-CM

## 2022-07-20 DIAGNOSIS — M25642 Stiffness of left hand, not elsewhere classified: Secondary | ICD-10-CM | POA: Diagnosis not present

## 2022-07-20 NOTE — Progress Notes (Signed)
Received copy of [employer].  Scanned to chart.  Also received request to Support medical necessity for testing by [employer].  Completed form and sent last office note to [phone]

## 2022-07-20 NOTE — Telephone Encounter (Signed)
CSW attempted to contact patient per request of provider.  Left vm.

## 2022-07-20 NOTE — Progress Notes (Signed)
Referral to social work ordered.

## 2022-07-21 ENCOUNTER — Inpatient Hospital Stay: Payer: Medicare PPO | Attending: Neurosurgery | Admitting: Licensed Clinical Social Worker

## 2022-07-21 DIAGNOSIS — R2689 Other abnormalities of gait and mobility: Secondary | ICD-10-CM | POA: Diagnosis not present

## 2022-07-21 DIAGNOSIS — M6281 Muscle weakness (generalized): Secondary | ICD-10-CM | POA: Diagnosis not present

## 2022-07-21 NOTE — Progress Notes (Signed)
[facility] Clinical Social Work  Clinical Social Work was referred by nurse for assessment of psychosocial needs.  Clinical Social Worker contacted patient by phone  to offer support and assess for needs.    Patient lives independently.  She has a good support system and participates in self care activities.  She stated she has hired someone to drive her.  Her children have expressed concern that she has displayed cognitive changes.  Since her husband of 64 years passed away on Apr 02, 2024she has been interested in seeking therapy.  She has contacted [facility] and is waiting for a call back.  CSW provided active listening and supportive counseling.       Dorothey Baseman, LCSW  Clinical Social Worker [facility]

## 2022-07-22 ENCOUNTER — Encounter: Payer: Self-pay | Admitting: Internal Medicine

## 2022-07-22 DIAGNOSIS — M6281 Muscle weakness (generalized): Secondary | ICD-10-CM | POA: Diagnosis not present

## 2022-07-22 DIAGNOSIS — M25642 Stiffness of left hand, not elsewhere classified: Secondary | ICD-10-CM | POA: Diagnosis not present

## 2022-07-24 ENCOUNTER — Telehealth: Payer: Self-pay | Admitting: *Deleted

## 2022-07-24 DIAGNOSIS — F4320 Adjustment disorder, unspecified: Secondary | ICD-10-CM | POA: Diagnosis not present

## 2022-07-24 NOTE — Telephone Encounter (Signed)
CRN called and spoke with patient for follow up as requested by our Clinical Pharmacist. She reports doing ok. She has an appointment with her PCP on 4/18. She is currently seeking counseling and awaiting a call back from [facility] following the death of her husband last month. Her main concern today is not having her Dexamethasone 1/2 mg tablet. She says she used [facility] online but they are waiting on approval. She does admit that she sometimes gets confused using online. She then had a visitor so we ended the call and she plans to be at her appt as scheduled.  CRN phone call: 4 min CRN Documentation: 2 min

## 2022-07-29 ENCOUNTER — Encounter: Payer: Self-pay | Admitting: Internal Medicine

## 2022-07-29 DIAGNOSIS — R2689 Other abnormalities of gait and mobility: Secondary | ICD-10-CM | POA: Diagnosis not present

## 2022-07-29 DIAGNOSIS — M6281 Muscle weakness (generalized): Secondary | ICD-10-CM | POA: Diagnosis not present

## 2022-07-29 NOTE — Progress Notes (Unsigned)
Future Appointments  Date Time Provider Department  07/30/2022 11:30 AM Lucky Cowboy, MD [facility]  09/15/2022 12:30 PM Barbaraann Cao, Georgeanna Lea, MD [facility]  09/24/2022               annual cpe  3:00 PM Raynelle Dick, NP [facility]  10/19/2022 12:35 PM Sherrie George, MD [facility]  11/10/2022  2:00 PM Lonie Peak, MD [facility]    History of Present Illness:       This very nice 87 y.o.   WF  recently widowed about 3 weeks ago (Jul 09, 2022)  and who has HTN, HLD, GERD, Hypothyroidism  and Vitamin D Deficiency.                                                     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 Last head MRI on 06/12/2022 showed progressive decrease size of Glioblastoma .         Patient is treated for HTN  circa 2000 & BP has been controlled at home. Today's  . Patient has had no complaints of any cardiac type chest pain, palpitations, dyspnea / orthopnea / PND, dizziness, claudication, or dependent edema.        Hyperlipidemia is controlled with diet & meds. Patient denies myalgias or other med SE's. Last Lipids were  Lab Results  Component Value Date   CHOL 187 04/01/2022   HDL 76 04/01/2022   LDLCALC 94 04/01/2022   TRIG 80 04/01/2022   CHOLHDL 2.5 04/01/2022     Also, the patient has history of elevated glucose  and has had no symptoms of reactive hypoglycemia, diabetic polys, paresthesias or visual blurring.  Last A1c was  Normal :  Lab Results  Component Value Date   HGBA1C 5.3 09/23/2021                                                       Further, the patient also has history of Vitamin D Deficiency and supplements vitamin D . Last vitamin D was sl low  ( goal 70-100) :  Lab Results  Component Value Date   VD25OH 52 04/01/2022      Current Outpatient Medications on File Prior to Visit  Medication Sig   acetaminophen (TYLENOL) 500 MG tablet Take 500 mg by mouth 2 (two) times daily as needed (pain).   Ascorbic Acid (VITAMIN C PO) Take 1 tablet by mouth daily.   BIOTIN PO Take 1 tablet by mouth 2 (two) times daily.   dexamethasone (DECADRON) 0.5 MG tablet Take 1 tablet (0.5 mg total) by mouth daily.   famotidine (PEPCID) 20 MG tablet Take 20 mg by mouth at bedtime as needed for indigestion.   gabapentin (NEURONTIN) 100 MG capsule Take 1 capsule (100 mg total) by mouth 2 (two) times daily.   liothyronine (CYTOMEL) 5 MCG tablet Take 5 mcg by mouth in the and at bedtime.  MAGNESIUM PO Take 1 capsule by mouth 2 (two) times daily.   Melatonin 10 MG TABS Take 10 mg by mouth at bedtime.   Menthol, Topical Analgesic, (BIOFREEZE EX) Apply 1 Application topically at bedtime as needed (leg pain).   Misc Natural Products (OSTEO BI-FLEX JOINT SHIELD PO) Take 1 tablet by mouth 2 (two) times daily.   Multiple Vitamins-Minerals (ZINC PO) Take 1 tablet by mouth 2 (two) times daily.   olmesartan (BENICAR) 20 MG tablet Take 0.5 tablets (10 mg total) by mouth daily. Take 1/2 tablet  Daily  for BP   Polyethyl Glycol-Propyl Glycol (SYSTANE) 0.4-0.3 % GEL ophthalmic gel Place 1 Application into both eyes at bedtime.   TAURINE PO Take 1 capsule by mouth daily.   triamcinolone (KENALOG) 0.025 % ointment Apply 1 Application topically 2 (two) times daily.      Allergies  Allergen Reactions   Barbiturates Other (See Comments)    Unknown reaction   Other Other (See Comments)    Patient has AIP and is not to have hormones or red wine   Pentothal [Thiopental] Other (See Comments)    Unknown reaction   Sulfa Antibiotics Other (See Comments)    Unknown reaction     PMHx:   Past Medical History:  Diagnosis Date   Acute intermittent porphyria (HCC)    diagnosed at age 57   Cancer [facility])    CNS lymphoma (HCC)    COVID-19 08/26/2020    Fracture of radial head, right, closed 09/16/2012   GERD (gastroesophageal reflux disease)    pepcid prn   Hypertension    Hypothyroidism    PONV (postoperative nausea and vomiting)      Immunization History  Administered Date(s) Administered   DT (Pediatric) 08/28/2014   Fluad Quad(high Dose 65+) 04/02/2021   Influenza, High Dose Seasonal PF 04/11/2019   PFIZER(Purple Top)SARS-COV-2 Vaccination 05/10/2019, 05/31/2019   Pneumococcal Conjugate-13 08/28/2015   Pneumococcal Polysaccharide-23 03/08/2017      Past Surgical History:  Procedure Laterality Date   APPLICATION OF CRANIAL NAVIGATION Right 09/02/2021   Procedure: APPLICATION OF CRANIAL NAVIGATION;  Surgeon: Bedelia Person, MD;  Location: [facility] OR;  Service: Neurosurgery;  Laterality: Right;   CATARACT EXTRACTION, BILATERAL  2020   Dr. Elmer Picker   FRAMELESS  BIOPSY WITH BRAINLAB Right 09/02/2021   Procedure: FRONTAL STEREOTACTIC BRAIN BIOPSY;  Surgeon: Bedelia Person, MD;  Location: [facility] OR;  Service: Neurosurgery;  Laterality: Right;   IR IMAGING GUIDED PORT INSERTION  09/24/2021   RADIAL HEAD ARTHROPLASTY Right 09/16/2012   Procedure: RADIAL HEAD ARTHROPLASTY;  Surgeon: Eulas Post, MD;  Location:  SURGERY CENTER;  Service: Orthopedics;  Laterality: Right;  RADIAL HEAD REPLACEMENT       FHx:    Reviewed / unchanged   SHx:    Reviewed / unchanged    Systems Review:  Constitutional: Denies fever, chills, wt changes, headaches, insomnia, fatigue, night sweats, change in appetite. Eyes: Denies redness, blurred vision, diplopia, discharge, itchy, watery eyes.  ENT: Denies discharge, congestion, post nasal drip, epistaxis, sore throat, earache, hearing loss, dental pain, tinnitus, vertigo, sinus pain, snoring.  CV: Denies chest pain, palpitations, irregular heartbeat, syncope, dyspnea, diaphoresis, orthopnea, PND, claudication or edema. Respiratory: denies cough, dyspnea, DOE, pleurisy, hoarseness,  laryngitis, wheezing.  Gastrointestinal: Denies dysphagia, odynophagia, heartburn, reflux, water brash, abdominal pain or cramps, nausea, vomiting, bloating, diarrhea, constipation, hematemesis, melena, hematochezia  or hemorrhoids. Genitourinary: Denies dysuria, frequency, urgency, nocturia, hesitancy, discharge, hematuria or flank pain. Musculoskeletal: Denies  arthralgias, myalgias, stiffness, jt. swelling, pain, limping or strain/sprain.  Skin: Denies pruritus, rash, hives, warts, acne, eczema or change in skin lesion(s). Neuro: No weakness, tremor, incoordination, spasms, paresthesia or pain. Psychiatric: Denies confusion, memory loss or sensory loss. Endo: Denies change in weight, skin or hair change.  Heme/Lymph: No excessive bleeding, bruising or enlarged lymph nodes.   Physical Exam  There were no vitals taken for this visit.  Appears  well nourished, well groomed  and in no distress.  Eyes: PERRLA, EOMs, conjunctiva no swelling or erythema. Sinuses: No frontal/maxillary tenderness ENT/Mouth: EAC's clear, TM's nl w/o erythema, bulging. Nares clear w/o erythema, swelling, exudates. Oropharynx clear without erythema or exudates. Oral hygiene is good. Tongue normal, non obstructing. Hearing intact.  Neck: Supple. Thyroid not palpable. Car 2+/2+ without bruits, nodes or JVD. Chest: Respirations nl with BS clear & equal w/o rales, rhonchi, wheezing or stridor.  Cor: Heart sounds normal w/ regular rate and rhythm without sig. murmurs, gallops, clicks or rubs. Peripheral pulses normal and equal  without edema.  Abdomen: Soft & bowel sounds normal. Non-tender w/o guarding, rebound, hernias, masses or organomegaly.  Lymphatics: Unremarkable.  Musculoskeletal: Full ROM all peripheral extremities, joint stability, 5/5 strength and normal gait.  Skin: Warm, dry without exposed rashes, lesions or ecchymosis apparent.  Neuro: Cranial nerves intact, reflexes equal bilaterally. Sensory-motor  testing grossly intact. Tendon reflexes grossly intact.  Pysch: Alert & oriented x 3.  Insight and judgement nl & appropriate. No ideations.   Assessment and Plan:  - Continue medication, monitor blood pressure at home.  - Continue DASH diet.  Reminder to go to the ER if any CP,  SOB, nausea, dizziness, severe HA, changes vision/speech.  - Continue diet/meds, exercise,& lifestyle modifications.  - Continue monitor periodic cholesterol/liver & renal functions    - Continue diet, exercise  - Lifestyle modifications.  - Monitor appropriate labs. - Continue supplementation.        Discussed  regular exercise, BP monitoring, weight control to achieve/maintain BMI less than 25 and discussed med and SE's. Recommended labs to assess /monitor clinical status .  I discussed the assessment and treatment plan with the patient. The patient was provided an opportunity to ask questions and all were answered. The patient agreed with the plan and demonstrated an understanding of the instructions.  I provided over 30 minutes of exam, counseling, chart review and  complex critical decision making.        The patient was advised to call back or seek an in-person evaluation if the symptoms worsen or if the condition fails to improve as anticipated.   Marinus Maw, MD

## 2022-07-30 ENCOUNTER — Encounter: Payer: Self-pay | Admitting: Internal Medicine

## 2022-07-30 ENCOUNTER — Ambulatory Visit: Payer: Medicare PPO | Admitting: Internal Medicine

## 2022-07-30 ENCOUNTER — Telehealth: Payer: Self-pay | Admitting: *Deleted

## 2022-07-30 ENCOUNTER — Telehealth: Payer: Self-pay

## 2022-07-30 VITALS — BP 138/70 | HR 60 | Temp 97.9°F | Resp 16 | Ht 64.0 in | Wt 144.6 lb

## 2022-07-30 DIAGNOSIS — C719 Malignant neoplasm of brain, unspecified: Secondary | ICD-10-CM | POA: Diagnosis not present

## 2022-07-30 DIAGNOSIS — E039 Hypothyroidism, unspecified: Secondary | ICD-10-CM

## 2022-07-30 DIAGNOSIS — I10 Essential (primary) hypertension: Secondary | ICD-10-CM

## 2022-07-30 DIAGNOSIS — Z79899 Other long term (current) drug therapy: Secondary | ICD-10-CM

## 2022-07-30 DIAGNOSIS — R7309 Other abnormal glucose: Secondary | ICD-10-CM | POA: Diagnosis not present

## 2022-07-30 DIAGNOSIS — E782 Mixed hyperlipidemia: Secondary | ICD-10-CM | POA: Diagnosis not present

## 2022-07-30 DIAGNOSIS — E559 Vitamin D deficiency, unspecified: Secondary | ICD-10-CM | POA: Diagnosis not present

## 2022-07-30 MED ORDER — OLMESARTAN MEDOXOMIL 20 MG PO TABS: ORAL_TABLET | ORAL | 3 refills | Status: DC

## 2022-07-30 NOTE — Telephone Encounter (Signed)
PC to patient, informed her appointment on 08/20/22 needs to be moved to 2:30 per Dr Barbaraann Cao, she verbalizes understanding.  Also PC to patient's daughter, Mardella Layman, who will also be attending this appointment, informed her of time change to 2:30.

## 2022-07-30 NOTE — Telephone Encounter (Signed)
Rn called pt to inform her that no refill is needed for her Dexamethasone..She had left a message requesting a refill on this medication.  Per Dr. Barbaraann Cao today stated she can now discontinue this medication. Pt understood the conversation and was grateful for the phone call.

## 2022-07-31 ENCOUNTER — Telehealth: Payer: Self-pay | Admitting: Internal Medicine

## 2022-07-31 ENCOUNTER — Encounter: Payer: Self-pay | Admitting: Internal Medicine

## 2022-07-31 DIAGNOSIS — F4320 Adjustment disorder, unspecified: Secondary | ICD-10-CM | POA: Diagnosis not present

## 2022-07-31 DIAGNOSIS — M6281 Muscle weakness (generalized): Secondary | ICD-10-CM | POA: Diagnosis not present

## 2022-07-31 DIAGNOSIS — M25642 Stiffness of left hand, not elsewhere classified: Secondary | ICD-10-CM | POA: Diagnosis not present

## 2022-07-31 LAB — LIPID PANEL
Cholesterol: 182 mg/dL (ref <200)
HDL: 76 mg/dL (ref >=50)
LDL Cholesterol (Calc): 87 mg/dL (calc)
Non-HDL Cholesterol (Calc): 106 mg/dL (calc) (ref <130)
Total CHOL/HDL Ratio: 2.4 (calc) (ref <5.0)
Triglycerides: 99 mg/dL (ref <150)

## 2022-07-31 LAB — CBC WITH DIFFERENTIAL/PLATELET
Absolute Monocytes: 950 cells/uL (ref 200–950)
Basophils Absolute: 53 cells/uL (ref 0–200)
Basophils Relative: 0.6 %
Eosinophils Absolute: 114 cells/uL (ref 15–500)
Eosinophils Relative: 1.3 %
HCT: 38 % (ref 35.0–45.0)
Hemoglobin: 12.6 g/dL (ref 11.7–15.5)
Lymphs Abs: 1021 cells/uL (ref 850–3900)
MCH: 31.5 pg (ref 27.0–33.0)
MCHC: 33.2 g/dL (ref 32.0–36.0)
MCV: 95 fL (ref 80.0–100.0)
MPV: 9.7 fL (ref 7.5–12.5)
Monocytes Relative: 10.8 %
Neutro Abs: 6662 cells/uL (ref 1500–7800)
Neutrophils Relative %: 75.7 %
Platelets: 204 10*3/uL (ref 140–400)
RBC: 4 10*6/uL (ref 3.80–5.10)
RDW: 12.1 % (ref 11.0–15.0)
Total Lymphocyte: 11.6 %
WBC: 8.8 10*3/uL (ref 3.8–10.8)

## 2022-07-31 LAB — COMPLETE METABOLIC PANEL WITH GFR
AG Ratio: 1.6 (calc) (ref 1.0–2.5)
ALT: 11 U/L (ref 6–29)
AST: 18 U/L (ref 10–35)
Albumin: 4.1 g/dL (ref 3.6–5.1)
Alkaline phosphatase (APISO): 51 U/L (ref 37–153)
BUN: 18 mg/dL (ref 7–25)
CO2: 32 mmol/L (ref 20–32)
Calcium: 9.9 mg/dL (ref 8.6–10.4)
Chloride: 104 mmol/L (ref 98–110)
Creat: 0.79 mg/dL (ref 0.60–0.95)
Globulin: 2.6 g/dL (calc) (ref 1.9–3.7)
Glucose, Bld: 85 mg/dL (ref 65–99)
Potassium: 4.3 mmol/L (ref 3.5–5.3)
Sodium: 145 mmol/L (ref 135–146)
Total Bilirubin: 0.8 mg/dL (ref 0.2–1.2)
Total Protein: 6.7 g/dL (ref 6.1–8.1)
eGFR: 74 mL/min/{1.73_m2} (ref >=60)

## 2022-07-31 LAB — HEMOGLOBIN A1C
Hgb A1c MFr Bld: 5.4 % of total Hgb (ref <5.7)
Mean Plasma Glucose: 108 mg/dL
eAG (mmol/L): 6 mmol/L

## 2022-07-31 LAB — MAGNESIUM: Magnesium: 2.3 mg/dL (ref 1.5–2.5)

## 2022-07-31 LAB — TSH: TSH: 0.93 mIU/L (ref 0.40–4.50)

## 2022-07-31 LAB — VITAMIN D 25 HYDROXY (VIT D DEFICIENCY, FRACTURES): Vit D, 25-Hydroxy: 58 ng/mL (ref 30–100)

## 2022-07-31 LAB — INSULIN, RANDOM: Insulin: 18.2 u[IU]/mL

## 2022-07-31 NOTE — Telephone Encounter (Signed)
Scheduled per 04/18 inbasket message, patient has been called and notified

## 2022-08-01 NOTE — Progress Notes (Signed)
^<^<^<^<^<^<^<^<^<^<^<^<^<^<^<^<^<^<^<^<^<^<^<^<^<^<^<^<^<^<^<^<^<^<^<^<^ ^>^>^>^>^>^>^>^>^>^>^>>^>^>^>^>^>^>^>^>^>^>^>^>^>^>^>^>^>^>^>^>^>^>^>^>^>  -  Test results slightly outside the reference range are not unusual. If there is anything important, I will review this with you,  otherwise it is considered normal test values.  If you have further questions,  please do not hesitate to contact me at the office or via My Chart.   ^<^<^<^<^<^<^<^<^<^<^<^<^<^<^<^<^<^<^<^<^<^<^<^<^<^<^<^<^<^<^<^<^<^<^<^<^ ^>^>^>^>^>^>^>^>^>^>^>^>^>^>^>^>^>^>^>^>^>^>^>^>^>^>^>^>^>^>^>^>^>^>^>^>^  -  Chol = 182    &    LDL = 87    -    Both   Excellent   - Very low risk for Heart Attack  / Stroke ^>^>^>^>^>^>^>^>^>^>^>^>^>^>^>^>^>^>^>^>^>^>^>^>^>^>^>^>^>^>^>^>^>^>^>^>^ -^>^>^>^>^>^>^>^>^>^>^>^>^>^>^>^>^>^>^>^>^>^>^>^>^>^>^>^>^>^>^>^>^>^>^>^>^  -  A1c - Normal  - No Diabetes   - Great !  ^>^>^>^>^>^>^>^>^>^>^>^>^>^>^>^>^>^>^>^>^>^>^>^>^>^>^>^>^>^>^>^>^>^>^>^>^  -  Vitamin D  = 86  - Great  !   Please keep dose  same  ^>^>^>^>^>^>^>^>^>^>^>^>^>^>^>^>^>^>^>^>^>^>^>^>^>^>^>^>^>^>^>^>^>^>^>^>^ ^>^>^>^>^>^>^>^>^>^>^>^>^>^>^>^>^>^>^>^>^>^>^>^>^>^>^>^>^>^>^>^>^>^>^>^>^  - All Else - CBC - Kidneys - Electrolytes - Liver - Magnesium & Thyroid    - all  Normal / OK  - Keep up the Great  Work  !  ^>^>^>^>^>^>^>^>^>^>^>^>^>^>^>^>^>^>^>^>^>^>^>^>^>^>^>^>^>^>^>^>^>^>^>^>^ ^>^>^>^>^>^>^>^>^>^>^>^>^>^>^>^>^>^>^>^>^>^>^>^>^>^>^>^>^>^>^>^>^>^>^>^>^

## 2022-08-03 DIAGNOSIS — R2689 Other abnormalities of gait and mobility: Secondary | ICD-10-CM | POA: Diagnosis not present

## 2022-08-03 DIAGNOSIS — M6281 Muscle weakness (generalized): Secondary | ICD-10-CM | POA: Diagnosis not present

## 2022-08-04 ENCOUNTER — Encounter: Payer: Self-pay | Admitting: Nurse Practitioner

## 2022-08-04 ENCOUNTER — Other Ambulatory Visit: Payer: Self-pay | Admitting: Internal Medicine

## 2022-08-05 DIAGNOSIS — M6281 Muscle weakness (generalized): Secondary | ICD-10-CM | POA: Diagnosis not present

## 2022-08-05 DIAGNOSIS — R2689 Other abnormalities of gait and mobility: Secondary | ICD-10-CM | POA: Diagnosis not present

## 2022-08-06 ENCOUNTER — Telehealth: Payer: Self-pay

## 2022-08-06 NOTE — Telephone Encounter (Addendum)
RN called pt to discuss her concern for needed competency documentation. Pt informed of Dr. Basilio Cairo recommendations that she see Dr. Barbaraann Cao (or another Neurology md ) first, then Dr. Basilio Cairo would like her to follow up with her as well if they feel like there is a need.  Dr. Basilio Cairo feels like pt has been mentally sharp in past appointments but wants to know if anything has changed. Pt understood and has a follow up with Dr. Barbaraann Cao on 08-20-22 (that her daughter from [place] will attend with her). She also stated she has an MRI coming up soon as well. She did state on the phone she did not feel like at times she was being listened to by her mds (including internist, which she wants to change). She feels like the doctors are listening more to her daughter. This is a frustration for her. The pt was appreciative of this call and overall stated she was feeling well.  She was grateful that she did not have many side effects of the chemo and radiation. She know to call with any further needs.

## 2022-08-07 ENCOUNTER — Encounter: Payer: Self-pay | Admitting: Nurse Practitioner

## 2022-08-07 ENCOUNTER — Encounter: Payer: Self-pay | Admitting: Internal Medicine

## 2022-08-07 DIAGNOSIS — F4320 Adjustment disorder, unspecified: Secondary | ICD-10-CM | POA: Diagnosis not present

## 2022-08-11 DIAGNOSIS — N182 Chronic kidney disease, stage 2 (mild): Secondary | ICD-10-CM | POA: Diagnosis not present

## 2022-08-11 DIAGNOSIS — C711 Malignant neoplasm of frontal lobe: Secondary | ICD-10-CM | POA: Diagnosis not present

## 2022-08-11 DIAGNOSIS — I129 Hypertensive chronic kidney disease with stage 1 through stage 4 chronic kidney disease, or unspecified chronic kidney disease: Secondary | ICD-10-CM | POA: Diagnosis not present

## 2022-08-19 DIAGNOSIS — M6281 Muscle weakness (generalized): Secondary | ICD-10-CM | POA: Diagnosis not present

## 2022-08-19 DIAGNOSIS — M25642 Stiffness of left hand, not elsewhere classified: Secondary | ICD-10-CM | POA: Diagnosis not present

## 2022-08-20 ENCOUNTER — Inpatient Hospital Stay: Payer: Medicare PPO | Attending: Neurosurgery | Admitting: Internal Medicine

## 2022-08-20 ENCOUNTER — Other Ambulatory Visit: Payer: Self-pay

## 2022-08-20 VITALS — BP 110/67 | HR 81 | Temp 97.7°F | Resp 15 | Wt 143.1 lb

## 2022-08-20 DIAGNOSIS — R200 Anesthesia of skin: Secondary | ICD-10-CM | POA: Diagnosis not present

## 2022-08-20 DIAGNOSIS — Z79899 Other long term (current) drug therapy: Secondary | ICD-10-CM | POA: Diagnosis not present

## 2022-08-20 DIAGNOSIS — I10 Essential (primary) hypertension: Secondary | ICD-10-CM | POA: Diagnosis not present

## 2022-08-20 DIAGNOSIS — R202 Paresthesia of skin: Secondary | ICD-10-CM | POA: Diagnosis not present

## 2022-08-20 DIAGNOSIS — Z8261 Family history of arthritis: Secondary | ICD-10-CM | POA: Insufficient documentation

## 2022-08-20 DIAGNOSIS — M545 Low back pain, unspecified: Secondary | ICD-10-CM | POA: Diagnosis not present

## 2022-08-20 DIAGNOSIS — Z818 Family history of other mental and behavioral disorders: Secondary | ICD-10-CM | POA: Insufficient documentation

## 2022-08-20 DIAGNOSIS — Z8616 Personal history of COVID-19: Secondary | ICD-10-CM | POA: Diagnosis not present

## 2022-08-20 DIAGNOSIS — R2681 Unsteadiness on feet: Secondary | ICD-10-CM | POA: Insufficient documentation

## 2022-08-20 DIAGNOSIS — Z8269 Family history of other diseases of the musculoskeletal system and connective tissue: Secondary | ICD-10-CM | POA: Diagnosis not present

## 2022-08-20 DIAGNOSIS — R2689 Other abnormalities of gait and mobility: Secondary | ICD-10-CM | POA: Insufficient documentation

## 2022-08-20 DIAGNOSIS — Z87891 Personal history of nicotine dependence: Secondary | ICD-10-CM | POA: Diagnosis not present

## 2022-08-20 DIAGNOSIS — C8589 Other specified types of non-Hodgkin lymphoma, extranodal and solid organ sites: Secondary | ICD-10-CM

## 2022-08-20 DIAGNOSIS — Z882 Allergy status to sulfonamides status: Secondary | ICD-10-CM | POA: Diagnosis not present

## 2022-08-20 DIAGNOSIS — Z8262 Family history of osteoporosis: Secondary | ICD-10-CM | POA: Diagnosis not present

## 2022-08-20 DIAGNOSIS — M25569 Pain in unspecified knee: Secondary | ICD-10-CM | POA: Insufficient documentation

## 2022-08-20 DIAGNOSIS — C8339 Diffuse large B-cell lymphoma, extranodal and solid organ sites: Secondary | ICD-10-CM | POA: Diagnosis not present

## 2022-08-20 DIAGNOSIS — Z808 Family history of malignant neoplasm of other organs or systems: Secondary | ICD-10-CM | POA: Insufficient documentation

## 2022-08-20 DIAGNOSIS — Z8249 Family history of ischemic heart disease and other diseases of the circulatory system: Secondary | ICD-10-CM | POA: Diagnosis not present

## 2022-08-20 NOTE — Progress Notes (Signed)
[facility] at [facility] [address]  [place], [place] [address] [phone]   Interval Evaluation  Date of Service: 08/20/22 Patient Name: Daisy Oliver Patient MRN: [id number] Patient DOB: 04-May-1935 Provider: Henreitta Leber, MD  Identifying Statement:  Daisy Oliver is a 87 y.o. female with right frontal  CNS lymphoma    Oncologic History: Oncology History  Primary CNS lymphoma (HCC)  09/02/2021 Surgery   Stereotactic biopsy by Dr. Maisie Fus; path is B-cell lymphoma CD20+   09/30/2021 - 12/05/2021 Chemotherapy   Patient is on Treatment Plan : IP NON-HODGKINS LYMPHOMA High Dose Methotrexate + Leucovorin Rescue     10/02/2021 - 01/01/2022 Chemotherapy   Patient is on Treatment Plan : NON-HODGKINS LYMPHOMA Rituximab q21d     01/01/2022 -  Chemotherapy   Patient is on Treatment Plan : BRAIN GLIOBLASTOMA Consolidation Temozolomide Days 1-5 q28 Days      01/29/2022 - 02/23/2022 Radiation Therapy   Whole brain radiation with right frontal boost with Dr. Basilio Cairo     Biomarkers:  CD20 positive .  Ki-67 50% .   Unknown   Unknown   Interval History: Daisy Oliver presents today for clinical follow up.  She is doing well on her own since the loss of her husband, proceeding through the grieving process.  No new or progressive changes today, aside from chronic orthopedic issues. Currently independent with gait and functional status, is utilizing a cane most of the time.  Continues to describe numbness, tingling of bottoms of feet since chemo.  H+P (09/16/21) Patient presented to medical attention in May 2023 with several weeks of progressive gait instability.  She describes feeling unsteady on her feet, even falling "a couple of times".  After one of the falls, ED visit led to CNS imaging which demonstrated multifocal masses in the right hemisphere.  She underwent stereotactic biopsy with Dr. Maisie Fus on 09/02/21; path demonstrated diffuse large  B-cell lymphoma, CD20+.  Since surgery, she has tapered off the steroids without issue.  She feels "fairly normal" aside from pain in her knee and lower back.  She uses a cane to walk, mainly because of the knee pain.  No issues with frank weakness, no seizures.  She lives at home with her husband who has dementia.  Daughter is nearby in [place].  Medications: Current Outpatient Medications on File Prior to Visit  Medication Sig Dispense Refill   acetaminophen (TYLENOL) 500 MG tablet Take 500 mg by mouth 2 (two) times daily as needed (pain).     Ascorbic Acid (VITAMIN C PO) Take 1 tablet by mouth daily.     famotidine (PEPCID) 20 MG tablet Take 20 mg by mouth at bedtime as needed for indigestion.     liothyronine (CYTOMEL) 5 MCG tablet Take 5 mcg by mouth in the and at bedtime. 180 tablet 3   MAGNESIUM PO Take 1 capsule by mouth 2 (two) times daily.     Melatonin 10 MG TABS Take 10 mg by mouth at bedtime.     Menthol, Topical Analgesic, (BIOFREEZE EX) Apply 1 Application topically at bedtime as needed (leg pain).     Misc Natural Products (OSTEO BI-FLEX JOINT SHIELD PO) Take 1 tablet by mouth 2 (two) times daily.     Multiple Vitamins-Minerals (ZINC PO) Take 1 tablet by mouth 2 (two) times daily.     olmesartan (BENICAR) 20 MG tablet Take  1 tablet  Daily  for BP 90 tablet 3   Polyethyl  Glycol-Propyl Glycol (SYSTANE) 0.4-0.3 % GEL ophthalmic gel Place 1 Application into both eyes at bedtime.     BIOTIN PO Take 1 tablet by mouth 2 (two) times daily.     gabapentin (NEURONTIN) 100 MG capsule Take 1 capsule (100 mg total) by mouth 2 (two) times daily. 60 capsule 3   TAURINE PO Take 1 capsule by mouth daily.     triamcinolone (KENALOG) 0.025 % ointment Apply 1 Application topically 2 (two) times daily. 30 g 0   No current facility-administered medications on file prior to visit.    Allergies:  Allergies  Allergen Reactions   Barbiturates Other (See Comments)    Unknown reaction   Other  Other (See Comments)    Patient has AIP and is not to have hormones or red wine   Pentothal [Thiopental] Other (See Comments)    Unknown reaction   Sulfa Antibiotics Other (See Comments)    Unknown reaction   Past Medical History:  Past Medical History:  Diagnosis Date   Acute intermittent porphyria (HCC)    diagnosed at age 71   Cancer [facility])    CNS lymphoma (HCC)    COVID-19 08/26/2020   Fracture of radial head, right, closed 09/16/2012   GERD (gastroesophageal reflux disease)    pepcid prn   Hypertension    Hypothyroidism    PONV (postoperative nausea and vomiting)    Past Surgical History:  Past Surgical History:  Procedure Laterality Date   APPLICATION OF CRANIAL NAVIGATION Right 09/02/2021   Procedure: APPLICATION OF CRANIAL NAVIGATION;  Surgeon: Bedelia Person, MD;  Location: [facility] OR;  Service: Neurosurgery;  Laterality: Right;   CATARACT EXTRACTION, BILATERAL  2020   Dr. Elmer Picker   FRAMELESS  BIOPSY WITH BRAINLAB Right 09/02/2021   Procedure: FRONTAL STEREOTACTIC BRAIN BIOPSY;  Surgeon: Bedelia Person, MD;  Location: [facility] OR;  Service: Neurosurgery;  Laterality: Right;   IR IMAGING GUIDED PORT INSERTION  09/24/2021   RADIAL HEAD ARTHROPLASTY Right 09/16/2012   Procedure: RADIAL HEAD ARTHROPLASTY;  Surgeon: Eulas Post, MD;  Location: [facility];  Service: Orthopedics;  Laterality: Right;  RADIAL HEAD REPLACEMENT     Social History:  Social History   Socioeconomic History   Marital status: Married    Spouse name: Not on file   Number of children: 2   Years of education: Not on file   Highest education level: Not on file  Occupational History   Not on file  Tobacco Use   Smoking status: Former    Packs/day: 2.00    Years: 22.00    Additional pack years: 0.00    Total pack years: 44.00    Types: Cigarettes    Quit date: 09/17/1983    Years since quitting: 38.9   Smokeless tobacco: Never  Vaping Use   Vaping Use: Never used  Substance and  Sexual Activity   Alcohol use: Not Currently    Alcohol/week: 0.0 - 1.0 standard drinks of alcohol    Comment: 2/month   Drug use: No   Sexual activity: Not Currently  Other Topics Concern   Not on file  Social History Narrative   Not on file   Social Determinants of Health   Financial Resource Strain: Not on file  Food Insecurity: No Food Insecurity (01/20/2022)   Hunger Vital Sign    Worried About Running Out of Food in the Last Year: Never true    Ran Out of Food in the Last Year: Never true  Transportation Needs: No Transportation Needs (01/20/2022)   PRAPARE - Administrator, Civil Service (Medical): No    Lack of Transportation (Non-Medical): No  Physical Activity: Sufficiently Active (09/14/2017)   Exercise Vital Sign    Days of Exercise per Week: 5 days    Minutes of Exercise per Session: 60 min  Stress: No Stress Concern Present (09/14/2017)   [employer] of Occupational Health - Occupational Stress Questionnaire    Feeling of Stress : Only a little  Social Connections: Not on file  Intimate Partner Violence: Not At Risk (01/20/2022)   Humiliation, Afraid, Rape, and Kick questionnaire    Fear of Current or Ex-Partner: No    Emotionally Abused: No    Physically Abused: No    Sexually Abused: No   Family History:  Family History  Problem Relation Age of Onset   Heart disease Mother    Brain cancer Father 88       Brain tumor, not cancerous   Hypertension Sister    Osteoporosis Sister    Arthritis Sister    Scoliosis Sister    Hypertension Sister    Osteoporosis Sister    Cancer Paternal Grandfather    Suicidality Paternal Grandfather    Breast cancer Neg Hx     Review of Systems: Constitutional: Doesn't report fevers, chills or abnormal weight loss Eyes: Doesn't report blurriness of vision Ears, nose, mouth, throat, and face: Doesn't report sore throat Respiratory: Doesn't report cough, dyspnea or wheezes Cardiovascular: Doesn't report  palpitation, chest discomfort  Gastrointestinal:  Doesn't report nausea, constipation, diarrhea GU: Doesn't report incontinence Skin: Doesn't report skin rashes Neurological: Per HPI Musculoskeletal: Doesn't report joint pain Behavioral/Psych: Doesn't report anxiety  Physical Exam: Vitals:   08/20/22 1440  BP: 110/67  Pulse: 81  Resp: 15  Temp: 97.7 F (36.5 C)  SpO2: 96%    KPS: 80. General: Alert, cooperative, pleasant, in no acute distress Head: Normal EENT: No conjunctival injection or scleral icterus.  Lungs: Resp effort normal Cardiac: Regular rate Abdomen: Non-distended abdomen Skin: No rashes cyanosis or petechiae. Extremities: No clubbing or edema  Neurologic Exam: Mental Status: Awake, alert, attentive to examiner. Oriented to self and environment. Language is fluent with intact comprehension.  Cranial Nerves: Visual acuity is grossly normal. Visual fields are full. Extra-ocular movements intact. No ptosis. Face is symmetric Motor: Tone and bulk are normal. Power is full in both arms and legs, with subtle drift noted in left arm. Reflexes are symmetric, no pathologic reflexes present.  Sensory: Intact to light touch Gait: Orthopedic limitation, cane assisted   Labs: I have reviewed the data as listed    Component Value Date/Time   NA 145 07/30/2022 1125   K 4.3 07/30/2022 1125   CL 104 07/30/2022 1125   CO2 32 07/30/2022 1125   GLUCOSE 85 07/30/2022 1125   BUN 18 07/30/2022 1125   CREATININE 0.79 07/30/2022 1125   CALCIUM 9.9 07/30/2022 1125   PROT 6.7 07/30/2022 1125   ALBUMIN 3.7 01/26/2022 1100   AST 18 07/30/2022 1125   AST 23 01/26/2022 1100   ALT 11 07/30/2022 1125   ALT 23 01/26/2022 1100   ALKPHOS 46 01/26/2022 1100   BILITOT 0.8 07/30/2022 1125   BILITOT 0.6 01/26/2022 1100   GFRNONAA >60 01/26/2022 1100   GFRNONAA 66 09/19/2020 0000   GFRAA 76 09/19/2020 0000   Lab Results  Component Value Date   WBC 8.8 07/30/2022   NEUTROABS 6,662  07/30/2022  HGB 12.6 07/30/2022   HCT 38.0 07/30/2022   MCV 95.0 07/30/2022   PLT 204 07/30/2022     Assessment/Plan Primary CNS lymphoma (HCC)  Karter Hlavaty is clinically stable today.   Per our discussion, she maintains insight and judgement and is deeemed competent to make medical decisions.  No progression in any neurologic deficit.  We ask that Rolan Bucco return to clinic in 1 months with MRI brain for evaluation.    All questions were answered. The patient knows to call the clinic with any problems, questions or concerns. No barriers to learning were detected.  The total time spent in the encounter was 30 minutes and more than 50% was on counseling and review of test results   Henreitta Leber, MD Medical Director of Neuro-Oncology [facility] [place] 08/20/22 2:51 PM

## 2022-08-21 DIAGNOSIS — F4320 Adjustment disorder, unspecified: Secondary | ICD-10-CM | POA: Diagnosis not present

## 2022-08-25 DIAGNOSIS — F4320 Adjustment disorder, unspecified: Secondary | ICD-10-CM | POA: Diagnosis not present

## 2022-08-26 DIAGNOSIS — M25642 Stiffness of left hand, not elsewhere classified: Secondary | ICD-10-CM | POA: Diagnosis not present

## 2022-08-26 DIAGNOSIS — M6281 Muscle weakness (generalized): Secondary | ICD-10-CM | POA: Diagnosis not present

## 2022-08-28 DIAGNOSIS — I129 Hypertensive chronic kidney disease with stage 1 through stage 4 chronic kidney disease, or unspecified chronic kidney disease: Secondary | ICD-10-CM | POA: Diagnosis not present

## 2022-08-28 DIAGNOSIS — N182 Chronic kidney disease, stage 2 (mild): Secondary | ICD-10-CM | POA: Diagnosis not present

## 2022-09-04 DIAGNOSIS — F4320 Adjustment disorder, unspecified: Secondary | ICD-10-CM | POA: Diagnosis not present

## 2022-09-09 ENCOUNTER — Ambulatory Visit (HOSPITAL_COMMUNITY)
Admission: RE | Admit: 2022-09-09 | Discharge: 2022-09-09 | Disposition: A | Discharge status: Home / Self Care | Payer: Medicare PPO | Source: Ambulatory Visit | Attending: Internal Medicine | Admitting: Internal Medicine

## 2022-09-09 DIAGNOSIS — C8589 Other specified types of non-Hodgkin lymphoma, extranodal and solid organ sites: Secondary | ICD-10-CM | POA: Insufficient documentation

## 2022-09-09 DIAGNOSIS — G9389 Other specified disorders of brain: Secondary | ICD-10-CM | POA: Diagnosis not present

## 2022-09-09 MED ORDER — GADOBUTROL 1 MMOL/ML IV SOLN
6.0000 mL | Freq: Once | INTRAVENOUS | Status: AC | PRN
  Administered 2022-09-09: 6 mL via INTRAVENOUS

## 2022-09-09 MED ORDER — HEPARIN SOD (PORK) LOCK FLUSH 100 UNIT/ML IV SOLN
INTRAVENOUS | Status: AC
  Filled 2022-09-09: qty 5

## 2022-09-09 MED ORDER — HEPARIN SOD (PORK) LOCK FLUSH 100 UNIT/ML IV SOLN
500.0000 [IU] | Freq: Once | INTRAVENOUS | Status: AC
  Administered 2022-09-09: 500 [IU] via INTRAVENOUS

## 2022-09-09 MED ORDER — HEPARIN SOD (PORK) LOCK FLUSH 100 UNIT/ML IV SOLN: 500.0000 [IU] | Freq: Once | INTRAVENOUS | Status: DC

## 2022-09-11 DIAGNOSIS — N182 Chronic kidney disease, stage 2 (mild): Secondary | ICD-10-CM | POA: Diagnosis not present

## 2022-09-11 DIAGNOSIS — F4320 Adjustment disorder, unspecified: Secondary | ICD-10-CM | POA: Diagnosis not present

## 2022-09-11 DIAGNOSIS — I129 Hypertensive chronic kidney disease with stage 1 through stage 4 chronic kidney disease, or unspecified chronic kidney disease: Secondary | ICD-10-CM | POA: Diagnosis not present

## 2022-09-15 ENCOUNTER — Other Ambulatory Visit: Payer: Self-pay

## 2022-09-15 ENCOUNTER — Inpatient Hospital Stay: Payer: Medicare PPO | Attending: Neurosurgery | Admitting: Internal Medicine

## 2022-09-15 VITALS — BP 151/62 | HR 61 | Temp 97.3°F | Resp 17 | Ht 64.0 in | Wt 139.0 lb

## 2022-09-15 DIAGNOSIS — I10 Essential (primary) hypertension: Secondary | ICD-10-CM | POA: Insufficient documentation

## 2022-09-15 DIAGNOSIS — Z8616 Personal history of COVID-19: Secondary | ICD-10-CM | POA: Insufficient documentation

## 2022-09-15 DIAGNOSIS — Z8261 Family history of arthritis: Secondary | ICD-10-CM | POA: Insufficient documentation

## 2022-09-15 DIAGNOSIS — C8339 Diffuse large B-cell lymphoma, extranodal and solid organ sites: Secondary | ICD-10-CM | POA: Insufficient documentation

## 2022-09-15 DIAGNOSIS — R2681 Unsteadiness on feet: Secondary | ICD-10-CM | POA: Insufficient documentation

## 2022-09-15 DIAGNOSIS — R2689 Other abnormalities of gait and mobility: Secondary | ICD-10-CM | POA: Insufficient documentation

## 2022-09-15 DIAGNOSIS — Z882 Allergy status to sulfonamides status: Secondary | ICD-10-CM | POA: Insufficient documentation

## 2022-09-15 DIAGNOSIS — Z87891 Personal history of nicotine dependence: Secondary | ICD-10-CM | POA: Diagnosis not present

## 2022-09-15 DIAGNOSIS — C8589 Other specified types of non-Hodgkin lymphoma, extranodal and solid organ sites: Secondary | ICD-10-CM

## 2022-09-15 DIAGNOSIS — Z8262 Family history of osteoporosis: Secondary | ICD-10-CM | POA: Insufficient documentation

## 2022-09-15 DIAGNOSIS — M545 Low back pain, unspecified: Secondary | ICD-10-CM | POA: Insufficient documentation

## 2022-09-15 DIAGNOSIS — Z818 Family history of other mental and behavioral disorders: Secondary | ICD-10-CM | POA: Insufficient documentation

## 2022-09-15 DIAGNOSIS — Z8249 Family history of ischemic heart disease and other diseases of the circulatory system: Secondary | ICD-10-CM | POA: Diagnosis not present

## 2022-09-15 DIAGNOSIS — Z8269 Family history of other diseases of the musculoskeletal system and connective tissue: Secondary | ICD-10-CM | POA: Diagnosis not present

## 2022-09-15 DIAGNOSIS — M25569 Pain in unspecified knee: Secondary | ICD-10-CM | POA: Insufficient documentation

## 2022-09-15 DIAGNOSIS — E039 Hypothyroidism, unspecified: Secondary | ICD-10-CM | POA: Diagnosis not present

## 2022-09-15 DIAGNOSIS — Z808 Family history of malignant neoplasm of other organs or systems: Secondary | ICD-10-CM | POA: Diagnosis not present

## 2022-09-15 DIAGNOSIS — Z79899 Other long term (current) drug therapy: Secondary | ICD-10-CM | POA: Insufficient documentation

## 2022-09-15 NOTE — Progress Notes (Signed)
[facility] [address]  [place], [place] [address] [phone]   Interval Evaluation  Date of Service: 09/15/22 Patient Name: Daisy Oliver Patient MRN: [id number] Patient DOB: 1935/09/09 Provider: Henreitta Leber, MD  Identifying Statement:  Daisy Oliver is a 87 y.o. female with right frontal  CNS lymphoma    Oncologic History: Oncology History  Primary CNS lymphoma (HCC)  09/02/2021 Surgery   Stereotactic biopsy by Dr. Maisie Fus; path is B-cell lymphoma CD20+   09/30/2021 - 12/05/2021 Chemotherapy   Patient is on Treatment Plan : IP NON-HODGKINS LYMPHOMA High Dose Methotrexate + Leucovorin Rescue     10/02/2021 - 01/01/2022 Chemotherapy   Patient is on Treatment Plan : NON-HODGKINS LYMPHOMA Rituximab q21d     01/01/2022 -  Chemotherapy   Patient is on Treatment Plan : BRAIN GLIOBLASTOMA Consolidation Temozolomide Days 1-5 q28 Days      01/29/2022 - 02/23/2022 Radiation Therapy   Whole brain radiation with right frontal boost with Dr. Basilio Cairo     Biomarkers:  CD20 positive .  Ki-67 50% .   Unknown   Unknown   Interval History: Daisy Oliver presents today for follow up after recent MRI brain.  No new or progressive changes today, aside from chronic orthopedic issues. Currently independent with gait and functional status, is utilizing a cane most of the time.  Denies seizures, headaches.  H+P (09/16/21) Patient presented to medical attention in May 2023 with several weeks of progressive gait instability.  She describes feeling unsteady on her feet, even falling "a couple of times".  After one of the falls, ED visit led to CNS imaging which demonstrated multifocal masses in the right hemisphere.  She underwent stereotactic biopsy with Dr. Maisie Fus on 09/02/21; path demonstrated diffuse large B-cell lymphoma, CD20+.  Since surgery, she has tapered off the steroids without issue.  She feels "fairly normal" aside from pain in  her knee and lower back.  She uses a cane to walk, mainly because of the knee pain.  No issues with frank weakness, no seizures.  She lives at home with her husband who has dementia.  Daughter is nearby in [place].  Medications: Current Outpatient Medications on File Prior to Visit  Medication Sig Dispense Refill   acetaminophen (TYLENOL) 500 MG tablet Take 500 mg by mouth 2 (two) times daily as needed (pain).     Ascorbic Acid (VITAMIN C PO) Take 1 tablet by mouth daily.     famotidine (PEPCID) 20 MG tablet Take 20 mg by mouth at bedtime as needed for indigestion.     liothyronine (CYTOMEL) 5 MCG tablet Take 5 mcg by mouth in the and at bedtime. 180 tablet 3   MAGNESIUM PO Take 1 capsule by mouth 2 (two) times daily.     Melatonin 10 MG TABS Take 10 mg by mouth at bedtime.     Menthol, Topical Analgesic, (BIOFREEZE EX) Apply 1 Application topically at bedtime as needed (leg pain).     Misc Natural Products (OSTEO BI-FLEX JOINT SHIELD PO) Take 1 tablet by mouth 2 (two) times daily.     Multiple Vitamins-Minerals (ZINC PO) Take 1 tablet by mouth 2 (two) times daily.     olmesartan (BENICAR) 20 MG tablet Take  1 tablet  Daily  for BP 90 tablet 3   Polyethyl Glycol-Propyl Glycol (SYSTANE) 0.4-0.3 % GEL ophthalmic gel Place 1 Application into both eyes at bedtime.     BIOTIN PO Take 1  tablet by mouth 2 (two) times daily.     gabapentin (NEURONTIN) 100 MG capsule Take 1 capsule (100 mg total) by mouth 2 (two) times daily. 60 capsule 3   TAURINE PO Take 1 capsule by mouth daily.     triamcinolone (KENALOG) 0.025 % ointment Apply 1 Application topically 2 (two) times daily. 30 g 0   No current facility-administered medications on file prior to visit.    Allergies:  Allergies  Allergen Reactions   Barbiturates Other (See Comments)    Unknown reaction   Other Other (See Comments)    Patient has AIP and is not to have hormones or red wine   Pentothal [Thiopental] Other (See Comments)     Unknown reaction   Sulfa Antibiotics Other (See Comments)    Unknown reaction   Past Medical History:  Past Medical History:  Diagnosis Date   Acute intermittent porphyria (HCC)    diagnosed at age 73   Cancer [facility])    CNS lymphoma (HCC)    COVID-19 08/26/2020   Fracture of radial head, right, closed 09/16/2012   GERD (gastroesophageal reflux disease)    pepcid prn   Hypertension    Hypothyroidism    PONV (postoperative nausea and vomiting)    Past Surgical History:  Past Surgical History:  Procedure Laterality Date   APPLICATION OF CRANIAL NAVIGATION Right 09/02/2021   Procedure: APPLICATION OF CRANIAL NAVIGATION;  Surgeon: Bedelia Person, MD;  Location: [facility] OR;  Service: Neurosurgery;  Laterality: Right;   CATARACT EXTRACTION, BILATERAL  2020   Dr. Elmer Picker   FRAMELESS  BIOPSY WITH BRAINLAB Right 09/02/2021   Procedure: FRONTAL STEREOTACTIC BRAIN BIOPSY;  Surgeon: Bedelia Person, MD;  Location: [facility] OR;  Service: Neurosurgery;  Laterality: Right;   IR IMAGING GUIDED PORT INSERTION  09/24/2021   RADIAL HEAD ARTHROPLASTY Right 09/16/2012   Procedure: RADIAL HEAD ARTHROPLASTY;  Surgeon: Eulas Post, MD;  Location: [facility];  Service: Orthopedics;  Laterality: Right;  RADIAL HEAD REPLACEMENT     Social History:  Social History   Socioeconomic History   Marital status: Married    Spouse name: Not on file   Number of children: 2   Years of education: Not on file   Highest education level: Not on file  Occupational History   Not on file  Tobacco Use   Smoking status: Former    Packs/day: 2.00    Years: 22.00    Additional pack years: 0.00    Total pack years: 44.00    Types: Cigarettes    Quit date: 09/17/1983    Years since quitting: 39.0   Smokeless tobacco: Never  Vaping Use   Vaping Use: Never used  Substance and Sexual Activity   Alcohol use: Not Currently    Alcohol/week: 0.0 - 1.0 standard drinks of alcohol    Comment: 2/month   Drug use:  No   Sexual activity: Not Currently  Other Topics Concern   Not on file  Social History Narrative   Not on file   Social Determinants of Health   Financial Resource Strain: Not on file  Food Insecurity: No Food Insecurity (01/20/2022)   Hunger Vital Sign    Worried About Running Out of Food in the Last Year: Never true    Ran Out of Food in the Last Year: Never true  Transportation Needs: No Transportation Needs (01/20/2022)   PRAPARE - Transportation    Lack of Transportation (Medical): No    Lack  of Transportation (Non-Medical): No  Physical Activity: Sufficiently Active (09/14/2017)   Exercise Vital Sign    Days of Exercise per Week: 5 days    Minutes of Exercise per Session: 60 min  Stress: No Stress Concern Present (09/14/2017)   [employer] of Occupational Health - Occupational Stress Questionnaire    Feeling of Stress : Only a little  Social Connections: Not on file  Intimate Partner Violence: Not At Risk (01/20/2022)   Humiliation, Afraid, Rape, and Kick questionnaire    Fear of Current or Ex-Partner: No    Emotionally Abused: No    Physically Abused: No    Sexually Abused: No   Family History:  Family History  Problem Relation Age of Onset   Heart disease Mother    Brain cancer Father 68       Brain tumor, not cancerous   Hypertension Sister    Osteoporosis Sister    Arthritis Sister    Scoliosis Sister    Hypertension Sister    Osteoporosis Sister    Cancer Paternal Grandfather    Suicidality Paternal Grandfather    Breast cancer Neg Hx     Review of Systems: Constitutional: Doesn't report fevers, chills or abnormal weight loss Eyes: Doesn't report blurriness of vision Ears, nose, mouth, throat, and face: Doesn't report sore throat Respiratory: Doesn't report cough, dyspnea or wheezes Cardiovascular: Doesn't report palpitation, chest discomfort  Gastrointestinal:  Doesn't report nausea, constipation, diarrhea GU: Doesn't report incontinence Skin:  Doesn't report skin rashes Neurological: Per HPI Musculoskeletal: Doesn't report joint pain Behavioral/Psych: Doesn't report anxiety  Physical Exam: Vitals:   09/15/22 1226  BP: (!) 151/62  Pulse: 61  Resp: 17  Temp: (!) 97.3 F (36.3 C)  SpO2: 100%    KPS: 80. General: Alert, cooperative, pleasant, in no acute distress Head: Normal EENT: No conjunctival injection or scleral icterus.  Lungs: Resp effort normal Cardiac: Regular rate Abdomen: Non-distended abdomen Skin: No rashes cyanosis or petechiae. Extremities: No clubbing or edema  Neurologic Exam: Mental Status: Awake, alert, attentive to examiner. Oriented to self and environment. Language is fluent with intact comprehension.  Cranial Nerves: Visual acuity is grossly normal. Visual fields are full. Extra-ocular movements intact. No ptosis. Face is symmetric Motor: Tone and bulk are normal. Power is full in both arms and legs, with subtle drift noted in left arm. Reflexes are symmetric, no pathologic reflexes present.  Sensory: Intact to light touch Gait: Orthopedic limitation, cane assisted   Labs: I have reviewed the data as listed    Component Value Date/Time   NA 145 07/30/2022 1125   K 4.3 07/30/2022 1125   CL 104 07/30/2022 1125   CO2 32 07/30/2022 1125   GLUCOSE 85 07/30/2022 1125   BUN 18 07/30/2022 1125   CREATININE 0.79 07/30/2022 1125   CALCIUM 9.9 07/30/2022 1125   PROT 6.7 07/30/2022 1125   ALBUMIN 3.7 01/26/2022 1100   AST 18 07/30/2022 1125   AST 23 01/26/2022 1100   ALT 11 07/30/2022 1125   ALT 23 01/26/2022 1100   ALKPHOS 46 01/26/2022 1100   BILITOT 0.8 07/30/2022 1125   BILITOT 0.6 01/26/2022 1100   GFRNONAA >60 01/26/2022 1100   GFRNONAA 66 09/19/2020 0000   GFRAA 76 09/19/2020 0000   Lab Results  Component Value Date   WBC 8.8 07/30/2022   NEUTROABS 6,662 07/30/2022   HGB 12.6 07/30/2022   HCT 38.0 07/30/2022   MCV 95.0 07/30/2022   PLT 204 07/30/2022   Imaging:  [facility]  Clinician Interpretation: I have personally reviewed the CNS images as listed.  My interpretation, in the context of the patient's clinical presentation, is stable disease pending official read  No results found.   Assessment/Plan Primary CNS lymphoma (HCC)  Daisy Oliver is clinically stable today.  MRI brain demonstrates stable findings.  Although contrast sequences are not available, no T2/FLAIR changes are appreciated.  No progression in any neurologic deficit.  We ask that Rolan Bucco return to clinic in 3 months with MRI brain for evaluation.    All questions were answered. The patient knows to call the clinic with any problems, questions or concerns. No barriers to learning were detected.  The total time spent in the encounter was 30 minutes and more than 50% was on counseling and review of test results   Henreitta Leber, MD Medical Director of Neuro-Oncology [facility] 09/15/22 12:43 PM

## 2022-09-16 ENCOUNTER — Encounter: Payer: Self-pay | Admitting: Internal Medicine

## 2022-09-16 MED ORDER — GABAPENTIN 100 MG PO CAPS: 100.0000 mg | ORAL_CAPSULE | Freq: Two times a day (BID) | ORAL | 3 refills | Status: DC

## 2022-09-18 ENCOUNTER — Other Ambulatory Visit: Payer: Self-pay

## 2022-09-18 DIAGNOSIS — I129 Hypertensive chronic kidney disease with stage 1 through stage 4 chronic kidney disease, or unspecified chronic kidney disease: Secondary | ICD-10-CM | POA: Diagnosis not present

## 2022-09-18 DIAGNOSIS — M19012 Primary osteoarthritis, left shoulder: Secondary | ICD-10-CM | POA: Diagnosis not present

## 2022-09-18 DIAGNOSIS — Z9181 History of falling: Secondary | ICD-10-CM | POA: Diagnosis not present

## 2022-09-18 DIAGNOSIS — N189 Chronic kidney disease, unspecified: Secondary | ICD-10-CM | POA: Diagnosis not present

## 2022-09-18 DIAGNOSIS — G5793 Unspecified mononeuropathy of bilateral lower limbs: Secondary | ICD-10-CM | POA: Diagnosis not present

## 2022-09-18 DIAGNOSIS — C8339 Diffuse large B-cell lymphoma, extranodal and solid organ sites: Secondary | ICD-10-CM | POA: Diagnosis not present

## 2022-09-18 DIAGNOSIS — F4320 Adjustment disorder, unspecified: Secondary | ICD-10-CM | POA: Diagnosis not present

## 2022-09-21 ENCOUNTER — Inpatient Hospital Stay: Payer: Medicare PPO

## 2022-09-24 ENCOUNTER — Encounter: Payer: Medicare PPO | Admitting: Nurse Practitioner

## 2022-09-25 DIAGNOSIS — F4320 Adjustment disorder, unspecified: Secondary | ICD-10-CM | POA: Diagnosis not present

## 2022-09-27 DIAGNOSIS — N182 Chronic kidney disease, stage 2 (mild): Secondary | ICD-10-CM | POA: Diagnosis not present

## 2022-09-27 DIAGNOSIS — I129 Hypertensive chronic kidney disease with stage 1 through stage 4 chronic kidney disease, or unspecified chronic kidney disease: Secondary | ICD-10-CM | POA: Diagnosis not present

## 2022-10-02 DIAGNOSIS — F4320 Adjustment disorder, unspecified: Secondary | ICD-10-CM | POA: Diagnosis not present

## 2022-10-09 DIAGNOSIS — F4320 Adjustment disorder, unspecified: Secondary | ICD-10-CM | POA: Diagnosis not present

## 2022-10-11 DIAGNOSIS — I129 Hypertensive chronic kidney disease with stage 1 through stage 4 chronic kidney disease, or unspecified chronic kidney disease: Secondary | ICD-10-CM | POA: Diagnosis not present

## 2022-10-11 DIAGNOSIS — N182 Chronic kidney disease, stage 2 (mild): Secondary | ICD-10-CM | POA: Diagnosis not present

## 2022-10-13 DIAGNOSIS — G629 Polyneuropathy, unspecified: Secondary | ICD-10-CM | POA: Diagnosis not present

## 2022-10-13 DIAGNOSIS — M810 Age-related osteoporosis without current pathological fracture: Secondary | ICD-10-CM | POA: Diagnosis not present

## 2022-10-13 DIAGNOSIS — I739 Peripheral vascular disease, unspecified: Secondary | ICD-10-CM | POA: Diagnosis not present

## 2022-10-13 DIAGNOSIS — N182 Chronic kidney disease, stage 2 (mild): Secondary | ICD-10-CM | POA: Diagnosis not present

## 2022-10-13 DIAGNOSIS — E039 Hypothyroidism, unspecified: Secondary | ICD-10-CM | POA: Diagnosis not present

## 2022-10-13 DIAGNOSIS — K219 Gastro-esophageal reflux disease without esophagitis: Secondary | ICD-10-CM | POA: Diagnosis not present

## 2022-10-13 DIAGNOSIS — R32 Unspecified urinary incontinence: Secondary | ICD-10-CM | POA: Diagnosis not present

## 2022-10-13 DIAGNOSIS — I129 Hypertensive chronic kidney disease with stage 1 through stage 4 chronic kidney disease, or unspecified chronic kidney disease: Secondary | ICD-10-CM | POA: Diagnosis not present

## 2022-10-13 DIAGNOSIS — M1990 Unspecified osteoarthritis, unspecified site: Secondary | ICD-10-CM | POA: Diagnosis not present

## 2022-10-15 DIAGNOSIS — F4320 Adjustment disorder, unspecified: Secondary | ICD-10-CM | POA: Diagnosis not present

## 2022-10-19 ENCOUNTER — Encounter (INDEPENDENT_AMBULATORY_CARE_PROVIDER_SITE_OTHER): Payer: Medicare PPO | Admitting: Ophthalmology

## 2022-10-20 DIAGNOSIS — F4320 Adjustment disorder, unspecified: Secondary | ICD-10-CM | POA: Diagnosis not present

## 2022-10-21 ENCOUNTER — Encounter (INDEPENDENT_AMBULATORY_CARE_PROVIDER_SITE_OTHER): Payer: Medicare PPO | Admitting: Ophthalmology

## 2022-10-21 DIAGNOSIS — H43813 Vitreous degeneration, bilateral: Secondary | ICD-10-CM

## 2022-10-21 DIAGNOSIS — I10 Essential (primary) hypertension: Secondary | ICD-10-CM | POA: Diagnosis not present

## 2022-10-21 DIAGNOSIS — H348322 Tributary (branch) retinal vein occlusion, left eye, stable: Secondary | ICD-10-CM

## 2022-10-21 DIAGNOSIS — H35033 Hypertensive retinopathy, bilateral: Secondary | ICD-10-CM | POA: Diagnosis not present

## 2022-10-22 ENCOUNTER — Other Ambulatory Visit: Payer: Self-pay

## 2022-10-23 DIAGNOSIS — H01111 Allergic dermatitis of right upper eyelid: Secondary | ICD-10-CM | POA: Diagnosis not present

## 2022-10-27 DIAGNOSIS — I129 Hypertensive chronic kidney disease with stage 1 through stage 4 chronic kidney disease, or unspecified chronic kidney disease: Secondary | ICD-10-CM | POA: Diagnosis not present

## 2022-10-27 NOTE — Progress Notes (Signed)
Daisy Oliver presents today for follow up after completing radiation treatment for CNS Lymphoma. She completed treatment on 02-23-22.   MR BRAIN W WO CONTRAST 09/09/2022  IMPRESSION: 1. Stable since February post treatment appearance of the right frontal lobe. Encephalomalacia with mild residual enhancement. 2. No new intracranial abnormality.  Recent neurologic symptoms, if any:  Seizures: No Headaches: No Nausea: No Dizziness/ataxia: No Difficulty with hand coordination: Yes- mild Focal numbness/weakness: Yes- mild weakness Visual deficits/changes: Yes-mild Confusion/Memory deficits: Yes-mild  Other issues of note: Elevated stress levels.  Vitals- BP 122/71 (BP Location: Left Arm, Patient Position: Sitting, Cuff Size: Normal)   Pulse 66   Temp 97.9 F (36.6 C) (Temporal)   Resp 20   Ht 5\' 4"  (1.626 m)   Wt 140 lb 6.4 oz (63.7 kg)   SpO2 100%   BMI 24.10 kg/m   This concludes the interaction.  Ruel Favors, LPN

## 2022-11-05 DIAGNOSIS — E0590 Thyrotoxicosis, unspecified without thyrotoxic crisis or storm: Secondary | ICD-10-CM | POA: Diagnosis not present

## 2022-11-05 DIAGNOSIS — K9041 Non-celiac gluten sensitivity: Secondary | ICD-10-CM | POA: Diagnosis not present

## 2022-11-05 DIAGNOSIS — E039 Hypothyroidism, unspecified: Secondary | ICD-10-CM | POA: Diagnosis not present

## 2022-11-05 DIAGNOSIS — G629 Polyneuropathy, unspecified: Secondary | ICD-10-CM | POA: Diagnosis not present

## 2022-11-05 DIAGNOSIS — C8589 Other specified types of non-Hodgkin lymphoma, extranodal and solid organ sites: Secondary | ICD-10-CM | POA: Diagnosis not present

## 2022-11-05 DIAGNOSIS — R351 Nocturia: Secondary | ICD-10-CM | POA: Diagnosis not present

## 2022-11-05 DIAGNOSIS — Z0000 Encounter for general adult medical examination without abnormal findings: Secondary | ICD-10-CM | POA: Diagnosis not present

## 2022-11-06 DIAGNOSIS — H01111 Allergic dermatitis of right upper eyelid: Secondary | ICD-10-CM | POA: Diagnosis not present

## 2022-11-06 DIAGNOSIS — F4320 Adjustment disorder, unspecified: Secondary | ICD-10-CM | POA: Diagnosis not present

## 2022-11-09 ENCOUNTER — Encounter: Payer: Self-pay | Admitting: Internal Medicine

## 2022-11-10 ENCOUNTER — Ambulatory Visit
Admission: RE | Admit: 2022-11-10 | Discharge: 2022-11-10 | Disposition: A | Discharge status: Home / Self Care | Payer: Medicare PPO | Source: Ambulatory Visit | Attending: Radiation Oncology | Admitting: Radiation Oncology

## 2022-11-10 ENCOUNTER — Encounter: Payer: Self-pay | Admitting: Radiation Oncology

## 2022-11-10 VITALS — BP 122/71 | HR 66 | Temp 97.9°F | Resp 20 | Ht 64.0 in | Wt 140.4 lb

## 2022-11-10 DIAGNOSIS — C8589 Other specified types of non-Hodgkin lymphoma, extranodal and solid organ sites: Secondary | ICD-10-CM | POA: Diagnosis not present

## 2022-11-10 DIAGNOSIS — C8510 Unspecified B-cell lymphoma, unspecified site: Secondary | ICD-10-CM | POA: Diagnosis not present

## 2022-11-10 NOTE — Progress Notes (Signed)
Radiation Oncology         [phone] ________________________________  Name: [place] MRN: [id number]  Date: 11/10/2022  DOB: October 02, 1935  Follow-Up Visit Note  Outpatient  CC: Pcp, No  Vaslow, Georgeanna Lea, MD  Diagnosis and Prior Radiotherapy:    ICD-10-CM   1. 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 Large B-cell lymphoma (HCC)  C85.10        Radiation Treatment Dates: 01/29/2022 through 02/23/2022 Site Technique Total Dose (Gy) Dose per Fx (Gy) Completed Fx Beam Energies  Brain: Brain_Lymphom IMRT 39.1/39.1 2.3 17/17 6X    CHIEF COMPLAINT: Here for follow-up and surveillance of brain cancer  Narrative:  The patient returns today for routine follow-up.  [place] presents today for follow up after completing radiation treatment for CNS Lymphoma. She completed treatment on 02-23-22. Feels cognitively and physically stable compared to before radiation.  Her caregiver has similar observations -  The one change that she is falling a little bit more at home  Recent neurologic symptoms, if any:  Seizures: No Headaches: No Nausea: No Dizziness/ataxia: No Difficulty with hand coordination: Yes- mild Focal numbness/weakness: Yes- mild weakness Visual deficits/changes: Yes-mild Confusion/Memory deficits: Yes-mild   Other issues of note: Elevated stress levels-- she is considering an independent living home in [place] that offers further assistance if needed.  Vitals:   11/10/22 1404  BP: 122/71  Pulse: 66  Resp: 20  Temp: 97.9 F (36.6 C)  SpO2: 100%                                 ALLERGIES:  is allergic to barbiturates, other, pentothal [thiopental], and sulfa antibiotics.  Meds: Current Outpatient Medications  Medication Sig Dispense Refill   acetaminophen (TYLENOL) 500 MG tablet Take 500 mg by mouth 2 (two) times daily as needed (pain).     Ascorbic Acid (VITAMIN C PO) Take 1 tablet by mouth daily.     BIOTIN PO Take 1 tablet by  mouth 2 (two) times daily.     famotidine (PEPCID) 20 MG tablet Take 20 mg by mouth at bedtime as needed for indigestion.     gabapentin (NEURONTIN) 100 MG capsule Take 1 capsule (100 mg total) by mouth 2 (two) times daily. 60 capsule 3   liothyronine (CYTOMEL) 5 MCG tablet Take 5 mcg by mouth in the and at bedtime. 180 tablet 3   MAGNESIUM PO Take 1 capsule by mouth 2 (two) times daily.     Melatonin 10 MG TABS Take 10 mg by mouth at bedtime.     Menthol, Topical Analgesic, (BIOFREEZE EX) Apply 1 Application topically at bedtime as needed (leg pain).     Misc Natural Products (OSTEO BI-FLEX JOINT SHIELD PO) Take 1 tablet by mouth 2 (two) times daily.     Multiple Vitamins-Minerals (ZINC PO) Take 1 tablet by mouth 2 (two) times daily.     olmesartan (BENICAR) 20 MG tablet Take  1 tablet  Daily  for BP 90 tablet 3   Polyethyl Glycol-Propyl Glycol (SYSTANE) 0.4-0.3 % GEL ophthalmic gel Place 1 Application into both eyes at bedtime.     TAURINE PO Take 1 capsule by mouth daily.     triamcinolone (KENALOG) 0.025 % ointment Apply 1 Application topically 2 (two) times daily. 30 g 0   No current facility-administered medications for this encounter.    Physical Findings: The patient is  in no acute distress. Patient is alert and oriented.  height is 5\' 4"  (1.626 m) and weight is 140 lb 6.4 oz (63.7 kg). Her temporal temperature is 97.9 F (36.6 C). Her blood pressure is 122/71 and her pulse is 66. Her respiration is 20 and oxygen saturation is 100%. .    General: Alert and oriented, in no acute distress HEENT: Head is normocephalic. Extraocular movements are intact. Oropharynx is clear. Neck: Neck is supple, no palpable cervical or supraclavicular lymphadenopathy. Heart: Regular in rate and rhythm with no murmurs, rubs, or gallops. Chest: Clear to auscultation bilaterally, with no rhonchi, wheezes, or rales. Abdomen: Soft, nontender, nondistended, with no rigidity or guarding. Extremities: No  cyanosis or edema. Lymphatics: see Neck Exam Skin: No concerning lesions. Musculoskeletal: symmetric strength and muscle tone throughout. No obvious defecits. Neurologic: Cranial nerves II through XII are grossly intact. Speech is fluent. Coordination and discrimination is intact. Sensation grossly intact.  Psychiatric: Judgment and insight are intact. Affect is appropriate.    Lab Findings: Lab Results  Component Value Date   WBC 8.8 07/30/2022   HGB 12.6 07/30/2022   HCT 38.0 07/30/2022   MCV 95.0 07/30/2022   PLT 204 07/30/2022    Radiographic Findings: No results found.  Impression/Plan: CNS lymphoma   It was a pleasure seeing this patient again today. Most recent MRI is stable. She has recovered very well from the radiation. Patient and her caretaker feel as though her cognitive and physical function are intact and similar to her baseline, prior to the radiation, although she is falling a little bit more at home.  They attribute this to her taking risks that are not appropriate given her physical status, such as picking up a 40 pound object.  Overall, they are pleased with how she is doing  She will continue routine follow-up with MRIs under the care of Dr. Barbaraann Cao. She is scheduled to see Dr. Barbaraann Cao in September. We will continue to follow her through our tumor board, but radiation follow-up PRN. It was a pleasure taking part in this patient's care.   Of note, she still has her port in place. Patient states for her last few MRIs no one was available to access it. We will work with our team to try and ensure a someone is available to access her port for her future MRIs.  On date of service, in total, I spent 40 minutes on this encounter. Patient was seen in person.  _____________________________________   Joyice Faster, PA-C    Lonie Peak, MD

## 2022-11-13 DIAGNOSIS — F4320 Adjustment disorder, unspecified: Secondary | ICD-10-CM | POA: Diagnosis not present

## 2022-11-16 ENCOUNTER — Other Ambulatory Visit: Payer: Self-pay | Admitting: *Deleted

## 2022-11-20 DIAGNOSIS — F4320 Adjustment disorder, unspecified: Secondary | ICD-10-CM | POA: Diagnosis not present

## 2022-11-26 DIAGNOSIS — I129 Hypertensive chronic kidney disease with stage 1 through stage 4 chronic kidney disease, or unspecified chronic kidney disease: Secondary | ICD-10-CM | POA: Diagnosis not present

## 2022-11-26 DIAGNOSIS — N182 Chronic kidney disease, stage 2 (mild): Secondary | ICD-10-CM | POA: Diagnosis not present

## 2022-11-27 DIAGNOSIS — F4320 Adjustment disorder, unspecified: Secondary | ICD-10-CM | POA: Diagnosis not present

## 2022-12-04 DIAGNOSIS — F4320 Adjustment disorder, unspecified: Secondary | ICD-10-CM | POA: Diagnosis not present

## 2022-12-08 DIAGNOSIS — R7303 Prediabetes: Secondary | ICD-10-CM | POA: Diagnosis not present

## 2022-12-08 DIAGNOSIS — U099 Post covid-19 condition, unspecified: Secondary | ICD-10-CM | POA: Diagnosis not present

## 2022-12-08 DIAGNOSIS — E559 Vitamin D deficiency, unspecified: Secondary | ICD-10-CM | POA: Diagnosis not present

## 2022-12-11 DIAGNOSIS — F4320 Adjustment disorder, unspecified: Secondary | ICD-10-CM | POA: Diagnosis not present

## 2022-12-16 ENCOUNTER — Ambulatory Visit (HOSPITAL_COMMUNITY)
Admission: RE | Admit: 2022-12-16 | Discharge: 2022-12-16 | Disposition: A | Discharge status: Home / Self Care | Payer: Medicare PPO | Source: Ambulatory Visit | Attending: Internal Medicine | Admitting: Internal Medicine

## 2022-12-16 DIAGNOSIS — C8589 Other specified types of non-Hodgkin lymphoma, extranodal and solid organ sites: Secondary | ICD-10-CM | POA: Diagnosis not present

## 2022-12-16 DIAGNOSIS — G9389 Other specified disorders of brain: Secondary | ICD-10-CM | POA: Diagnosis not present

## 2022-12-16 MED ORDER — GADOBUTROL 1 MMOL/ML IV SOLN
7.0000 mL | Freq: Once | INTRAVENOUS | Status: AC | PRN
  Administered 2022-12-16: 7 mL via INTRAVENOUS

## 2022-12-16 MED ORDER — HEPARIN SOD (PORK) LOCK FLUSH 100 UNIT/ML IV SOLN
500.0000 [IU] | Freq: Once | INTRAVENOUS | Status: AC
  Administered 2022-12-16: 500 [IU] via INTRAVENOUS

## 2022-12-18 DIAGNOSIS — F4320 Adjustment disorder, unspecified: Secondary | ICD-10-CM | POA: Diagnosis not present

## 2022-12-22 ENCOUNTER — Inpatient Hospital Stay: Payer: Medicare PPO | Attending: Neurosurgery | Admitting: Internal Medicine

## 2022-12-22 ENCOUNTER — Inpatient Hospital Stay: Payer: Medicare PPO

## 2022-12-22 VITALS — BP 141/68 | HR 70 | Temp 97.4°F | Resp 14 | Wt 135.7 lb

## 2022-12-22 DIAGNOSIS — Z79899 Other long term (current) drug therapy: Secondary | ICD-10-CM | POA: Diagnosis not present

## 2022-12-22 DIAGNOSIS — Z95828 Presence of other vascular implants and grafts: Secondary | ICD-10-CM | POA: Insufficient documentation

## 2022-12-22 DIAGNOSIS — C8529 Mediastinal (thymic) large B-cell lymphoma, extranodal and solid organ sites: Secondary | ICD-10-CM | POA: Diagnosis not present

## 2022-12-22 DIAGNOSIS — M545 Low back pain, unspecified: Secondary | ICD-10-CM | POA: Insufficient documentation

## 2022-12-22 DIAGNOSIS — Z809 Family history of malignant neoplasm, unspecified: Secondary | ICD-10-CM | POA: Insufficient documentation

## 2022-12-22 DIAGNOSIS — Z808 Family history of malignant neoplasm of other organs or systems: Secondary | ICD-10-CM | POA: Insufficient documentation

## 2022-12-22 DIAGNOSIS — Z8616 Personal history of COVID-19: Secondary | ICD-10-CM | POA: Insufficient documentation

## 2022-12-22 DIAGNOSIS — R2681 Unsteadiness on feet: Secondary | ICD-10-CM | POA: Diagnosis not present

## 2022-12-22 DIAGNOSIS — Z87891 Personal history of nicotine dependence: Secondary | ICD-10-CM | POA: Diagnosis not present

## 2022-12-22 DIAGNOSIS — R2689 Other abnormalities of gait and mobility: Secondary | ICD-10-CM | POA: Insufficient documentation

## 2022-12-22 DIAGNOSIS — M25569 Pain in unspecified knee: Secondary | ICD-10-CM | POA: Insufficient documentation

## 2022-12-22 DIAGNOSIS — Z8261 Family history of arthritis: Secondary | ICD-10-CM | POA: Diagnosis not present

## 2022-12-22 DIAGNOSIS — C8589 Other specified types of non-Hodgkin lymphoma, extranodal and solid organ sites: Secondary | ICD-10-CM | POA: Diagnosis not present

## 2022-12-22 DIAGNOSIS — C8339 Diffuse large B-cell lymphoma, extranodal and solid organ sites: Secondary | ICD-10-CM | POA: Diagnosis present

## 2022-12-22 DIAGNOSIS — I10 Essential (primary) hypertension: Secondary | ICD-10-CM | POA: Diagnosis not present

## 2022-12-22 DIAGNOSIS — Z882 Allergy status to sulfonamides status: Secondary | ICD-10-CM | POA: Insufficient documentation

## 2022-12-22 DIAGNOSIS — Z8262 Family history of osteoporosis: Secondary | ICD-10-CM | POA: Insufficient documentation

## 2022-12-22 DIAGNOSIS — Z8249 Family history of ischemic heart disease and other diseases of the circulatory system: Secondary | ICD-10-CM | POA: Diagnosis not present

## 2022-12-22 MED ORDER — HEPARIN SOD (PORK) LOCK FLUSH 100 UNIT/ML IV SOLN
500.0000 [IU] | Freq: Once | INTRAVENOUS | Status: AC
  Administered 2022-12-22: 500 [IU]

## 2022-12-22 MED ORDER — SODIUM CHLORIDE 0.9% FLUSH
10.0000 mL | Freq: Once | INTRAVENOUS | Status: AC
  Administered 2022-12-22: 10 mL

## 2022-12-22 NOTE — Progress Notes (Signed)
[facility] at [facility] [address]  [place], [place] [address] [phone]   Interval Evaluation  Date of Service: 12/22/22 Patient Name: [place] Patient MRN: [id number] Patient DOB: May 13, 1935 Provider: Henreitta Leber, MD  Identifying Statement:  [place] is a 87 y.o. female with right frontal  CNS lymphoma    Oncologic History: Oncology History  Primary CNS lymphoma (HCC)  09/02/2021 Surgery   Stereotactic biopsy by Dr. Maisie Fus; path is B-cell lymphoma CD20+   09/30/2021 - 12/05/2021 Chemotherapy   Patient is on Treatment Plan : IP NON-HODGKINS LYMPHOMA High Dose Methotrexate + Leucovorin Rescue     10/02/2021 - 01/01/2022 Chemotherapy   Patient is on Treatment Plan : NON-HODGKINS LYMPHOMA Rituximab q21d     01/01/2022 -  Chemotherapy   Patient is on Treatment Plan : BRAIN GLIOBLASTOMA Consolidation Temozolomide Days 1-5 q28 Days      01/29/2022 - 02/23/2022 Radiation Therapy   Whole brain radiation with right frontal boost with Dr. Basilio Cairo     Biomarkers:  CD20 positive .  Ki-67 50% .   Unknown   Unknown   Interval History: [place] presents today for follow up after recent MRI brain.  No neurologic complaints today, aside from chronic orthopedic issues. Currently independent with gait and functional status, is utilizing a cane most of the time.  Denies seizures, headaches.  H+P (09/16/21) Patient presented to medical attention in May 2023 with several weeks of progressive gait instability.  She describes feeling unsteady on her feet, even falling "a couple of times".  After one of the falls, ED visit led to CNS imaging which demonstrated multifocal masses in the right hemisphere.  She underwent stereotactic biopsy with Dr. Maisie Fus on 09/02/21; path demonstrated diffuse large B-cell lymphoma, CD20+.  Since surgery, she has tapered off the steroids without issue.  She feels "fairly normal" aside from pain in her  knee and lower back.  She uses a cane to walk, mainly because of the knee pain.  No issues with frank weakness, no seizures.  She lives at home with her husband who has dementia.  Daughter is nearby in [place].  Medications: Current Outpatient Medications on File Prior to Visit  Medication Sig Dispense Refill   acetaminophen (TYLENOL) 500 MG tablet Take 500 mg by mouth 2 (two) times daily as needed (pain).     Ascorbic Acid (VITAMIN C PO) Take 1 tablet by mouth daily.     BIOTIN PO Take 1 tablet by mouth 2 (two) times daily.     famotidine (PEPCID) 20 MG tablet Take 20 mg by mouth at bedtime as needed for indigestion.     gabapentin (NEURONTIN) 100 MG capsule Take 1 capsule (100 mg total) by mouth 2 (two) times daily. 60 capsule 3   liothyronine (CYTOMEL) 5 MCG tablet Take 5 mcg by mouth in the and at bedtime. 180 tablet 3   MAGNESIUM PO Take 1 capsule by mouth 2 (two) times daily.     Melatonin 10 MG TABS Take 10 mg by mouth at bedtime.     Menthol, Topical Analgesic, (BIOFREEZE EX) Apply 1 Application topically at bedtime as needed (leg pain).     Misc Natural Products (OSTEO BI-FLEX JOINT SHIELD PO) Take 1 tablet by mouth 2 (two) times daily.     Multiple Vitamins-Minerals (ZINC PO) Take 1 tablet by mouth 2 (two) times daily.     olmesartan (BENICAR) 20 MG tablet Take  1 tablet  Daily  for BP 90 tablet 3   Polyethyl Glycol-Propyl Glycol (SYSTANE) 0.4-0.3 % GEL ophthalmic gel Place 1 Application into both eyes at bedtime.     TAURINE PO Take 1 capsule by mouth daily.     triamcinolone (KENALOG) 0.025 % ointment Apply 1 Application topically 2 (two) times daily. 30 g 0   No current facility-administered medications on file prior to visit.    Allergies:  Allergies  Allergen Reactions   Barbiturates Other (See Comments)    Unknown reaction   Other Other (See Comments)    Patient has AIP and is not to have hormones or red wine   Pentothal [Thiopental] Other (See Comments)    Unknown  reaction   Sulfa Antibiotics Other (See Comments)    Unknown reaction   Past Medical History:  Past Medical History:  Diagnosis Date   Acute intermittent porphyria (HCC)    diagnosed at age 54   Cancer [facility])    CNS lymphoma (HCC)    COVID-19 08/26/2020   Fracture of radial head, right, closed 09/16/2012   GERD (gastroesophageal reflux disease)    pepcid prn   Hypertension    Hypothyroidism    PONV (postoperative nausea and vomiting)    Past Surgical History:  Past Surgical History:  Procedure Laterality Date   APPLICATION OF CRANIAL NAVIGATION Right 09/02/2021   Procedure: APPLICATION OF CRANIAL NAVIGATION;  Surgeon: Bedelia Person, MD;  Location: [facility] OR;  Service: Neurosurgery;  Laterality: Right;   CATARACT EXTRACTION, BILATERAL  2020   Dr. Elmer Picker   FRAMELESS  BIOPSY WITH BRAINLAB Right 09/02/2021   Procedure: FRONTAL STEREOTACTIC BRAIN BIOPSY;  Surgeon: Bedelia Person, MD;  Location: [facility] OR;  Service: Neurosurgery;  Laterality: Right;   IR IMAGING GUIDED PORT INSERTION  09/24/2021   RADIAL HEAD ARTHROPLASTY Right 09/16/2012   Procedure: RADIAL HEAD ARTHROPLASTY;  Surgeon: Eulas Post, MD;  Location: [facility];  Service: Orthopedics;  Laterality: Right;  RADIAL HEAD REPLACEMENT     Social History:  Social History   Socioeconomic History   Marital status: Married    Spouse name: Not on file   Number of children: 2   Years of education: Not on file   Highest education level: Not on file  Occupational History   Not on file  Tobacco Use   Smoking status: Former    Current packs/day: 0.00    Average packs/day: 2.0 packs/day for 22.0 years (44.0 ttl pk-yrs)    Types: Cigarettes    Start date: 09/16/1961    Quit date: 09/17/1983    Years since quitting: 39.2   Smokeless tobacco: Never  Vaping Use   Vaping status: Never Used  Substance and Sexual Activity   Alcohol use: Not Currently    Alcohol/week: 0.0 - 1.0 standard drinks of alcohol    Comment:  2/month   Drug use: No   Sexual activity: Not Currently  Other Topics Concern   Not on file  Social History Narrative   Not on file   Social Determinants of Health   Financial Resource Strain: Not on file  Food Insecurity: No Food Insecurity (11/10/2022)   Hunger Vital Sign    Worried About Running Out of Food in the Last Year: Never true    Ran Out of Food in the Last Year: Never true  Transportation Needs: No Transportation Needs (11/10/2022)   PRAPARE - Administrator, Civil Service (Medical): No    Lack of Transportation (  Non-Medical): No  Physical Activity: Sufficiently Active (09/14/2017)   Exercise Vital Sign    Days of Exercise per Week: 5 days    Minutes of Exercise per Session: 60 min  Stress: No Stress Concern Present (09/14/2017)   [employer] of Occupational Health - Occupational Stress Questionnaire    Feeling of Stress : Only a little  Social Connections: Not on file  Intimate Partner Violence: Not At Risk (11/10/2022)   Humiliation, Afraid, Rape, and Kick questionnaire    Fear of Current or Ex-Partner: No    Emotionally Abused: No    Physically Abused: No    Sexually Abused: No   Family History:  Family History  Problem Relation Age of Onset   Heart disease Mother    Brain cancer Father 75       Brain tumor, not cancerous   Hypertension Sister    Osteoporosis Sister    Arthritis Sister    Scoliosis Sister    Hypertension Sister    Osteoporosis Sister    Cancer Paternal Grandfather    Suicidality Paternal Grandfather    Breast cancer Neg Hx     Review of Systems: Constitutional: Doesn't report fevers, chills or abnormal weight loss Eyes: Doesn't report blurriness of vision Ears, nose, mouth, throat, and face: Doesn't report sore throat Respiratory: Doesn't report cough, dyspnea or wheezes Cardiovascular: Doesn't report palpitation, chest discomfort  Gastrointestinal:  Doesn't report nausea, constipation, diarrhea GU: Doesn't report  incontinence Skin: Doesn't report skin rashes Neurological: Per HPI Musculoskeletal: Doesn't report joint pain Behavioral/Psych: Doesn't report anxiety  Physical Exam: Vitals:   12/22/22 1028  BP: (!) 141/68  Pulse: 70  Resp: 14  Temp: (!) 97.4 F (36.3 C)  SpO2: 94%    KPS: 80. General: Alert, cooperative, pleasant, in no acute distress Head: Normal EENT: No conjunctival injection or scleral icterus.  Lungs: Resp effort normal Cardiac: Regular rate Abdomen: Non-distended abdomen Skin: No rashes cyanosis or petechiae. Extremities: No clubbing or edema  Neurologic Exam: Mental Status: Awake, alert, attentive to examiner. Oriented to self and environment. Language is fluent with intact comprehension.  Cranial Nerves: Visual acuity is grossly normal. Visual fields are full. Extra-ocular movements intact. No ptosis. Face is symmetric Motor: Tone and bulk are normal. Power is full in both arms and legs, with subtle drift noted in left arm. Reflexes are symmetric, no pathologic reflexes present.  Sensory: Intact to light touch Gait: Orthopedic limitation, cane assisted   Labs: I have reviewed the data as listed    Component Value Date/Time   NA 145 07/30/2022 1125   K 4.3 07/30/2022 1125   CL 104 07/30/2022 1125   CO2 32 07/30/2022 1125   GLUCOSE 85 07/30/2022 1125   BUN 18 07/30/2022 1125   CREATININE 0.79 07/30/2022 1125   CALCIUM 9.9 07/30/2022 1125   PROT 6.7 07/30/2022 1125   ALBUMIN 3.7 01/26/2022 1100   AST 18 07/30/2022 1125   AST 23 01/26/2022 1100   ALT 11 07/30/2022 1125   ALT 23 01/26/2022 1100   ALKPHOS 46 01/26/2022 1100   BILITOT 0.8 07/30/2022 1125   BILITOT 0.6 01/26/2022 1100   GFRNONAA >60 01/26/2022 1100   GFRNONAA 66 09/19/2020 0000   GFRAA 76 09/19/2020 0000   Lab Results  Component Value Date   WBC 8.8 07/30/2022   NEUTROABS 6,662 07/30/2022   HGB 12.6 07/30/2022   HCT 38.0 07/30/2022   MCV 95.0 07/30/2022   PLT 204 07/30/2022    Imaging:  [facility]  Clinician Interpretation: I have personally reviewed the CNS images as listed.  My interpretation, in the context of the patient's clinical presentation, is stable disease pending official read  No results found.   Assessment/Plan Primary CNS lymphoma (HCC)  Anelly Nicte Sorber is clinically stable today.  MRI brain again demonstrates stable findings.  Official read is pending at this time.  No progression in any neurologic deficit.  We ask that Rolan Bucco return to clinic in 4 months with MRI brain for evaluation.    All questions were answered. The patient knows to call the clinic with any problems, questions or concerns. No barriers to learning were detected.  The total time spent in the encounter was 30 minutes and more than 50% was on counseling and review of test results   Henreitta Leber, MD Medical Director of Neuro-Oncology [facility] at [facility] 12/22/22 10:15 AM

## 2022-12-25 DIAGNOSIS — F4320 Adjustment disorder, unspecified: Secondary | ICD-10-CM | POA: Diagnosis not present

## 2022-12-26 DIAGNOSIS — I129 Hypertensive chronic kidney disease with stage 1 through stage 4 chronic kidney disease, or unspecified chronic kidney disease: Secondary | ICD-10-CM | POA: Diagnosis not present

## 2022-12-27 ENCOUNTER — Other Ambulatory Visit: Payer: Self-pay | Admitting: Nurse Practitioner

## 2023-01-01 DIAGNOSIS — F4320 Adjustment disorder, unspecified: Secondary | ICD-10-CM | POA: Diagnosis not present

## 2023-01-15 DIAGNOSIS — F4320 Adjustment disorder, unspecified: Secondary | ICD-10-CM | POA: Diagnosis not present

## 2023-01-22 DIAGNOSIS — F4320 Adjustment disorder, unspecified: Secondary | ICD-10-CM | POA: Diagnosis not present

## 2023-01-25 DIAGNOSIS — N182 Chronic kidney disease, stage 2 (mild): Secondary | ICD-10-CM | POA: Diagnosis not present

## 2023-01-25 DIAGNOSIS — I129 Hypertensive chronic kidney disease with stage 1 through stage 4 chronic kidney disease, or unspecified chronic kidney disease: Secondary | ICD-10-CM | POA: Diagnosis not present

## 2023-01-28 ENCOUNTER — Other Ambulatory Visit: Payer: Self-pay | Admitting: Internal Medicine

## 2023-01-28 ENCOUNTER — Other Ambulatory Visit: Payer: Self-pay | Admitting: Nurse Practitioner

## 2023-01-28 DIAGNOSIS — E039 Hypothyroidism, unspecified: Secondary | ICD-10-CM

## 2023-01-29 DIAGNOSIS — M170 Bilateral primary osteoarthritis of knee: Secondary | ICD-10-CM | POA: Diagnosis not present

## 2023-01-29 DIAGNOSIS — M25561 Pain in right knee: Secondary | ICD-10-CM | POA: Diagnosis not present

## 2023-01-29 DIAGNOSIS — M25562 Pain in left knee: Secondary | ICD-10-CM | POA: Diagnosis not present

## 2023-01-29 DIAGNOSIS — M25662 Stiffness of left knee, not elsewhere classified: Secondary | ICD-10-CM | POA: Diagnosis not present

## 2023-01-29 DIAGNOSIS — F4320 Adjustment disorder, unspecified: Secondary | ICD-10-CM | POA: Diagnosis not present

## 2023-01-29 DIAGNOSIS — M25661 Stiffness of right knee, not elsewhere classified: Secondary | ICD-10-CM | POA: Diagnosis not present

## 2023-01-29 DIAGNOSIS — R2689 Other abnormalities of gait and mobility: Secondary | ICD-10-CM | POA: Diagnosis not present

## 2023-01-29 DIAGNOSIS — M6281 Muscle weakness (generalized): Secondary | ICD-10-CM | POA: Diagnosis not present

## 2023-02-02 DIAGNOSIS — M25661 Stiffness of right knee, not elsewhere classified: Secondary | ICD-10-CM | POA: Diagnosis not present

## 2023-02-02 DIAGNOSIS — R2689 Other abnormalities of gait and mobility: Secondary | ICD-10-CM | POA: Diagnosis not present

## 2023-02-02 DIAGNOSIS — M25562 Pain in left knee: Secondary | ICD-10-CM | POA: Diagnosis not present

## 2023-02-02 DIAGNOSIS — M170 Bilateral primary osteoarthritis of knee: Secondary | ICD-10-CM | POA: Diagnosis not present

## 2023-02-02 DIAGNOSIS — M6281 Muscle weakness (generalized): Secondary | ICD-10-CM | POA: Diagnosis not present

## 2023-02-02 DIAGNOSIS — M25561 Pain in right knee: Secondary | ICD-10-CM | POA: Diagnosis not present

## 2023-02-02 DIAGNOSIS — M25662 Stiffness of left knee, not elsewhere classified: Secondary | ICD-10-CM | POA: Diagnosis not present

## 2023-02-19 DIAGNOSIS — F4320 Adjustment disorder, unspecified: Secondary | ICD-10-CM | POA: Diagnosis not present

## 2023-02-22 DIAGNOSIS — M25662 Stiffness of left knee, not elsewhere classified: Secondary | ICD-10-CM | POA: Diagnosis not present

## 2023-02-22 DIAGNOSIS — M25561 Pain in right knee: Secondary | ICD-10-CM | POA: Diagnosis not present

## 2023-02-22 DIAGNOSIS — M25661 Stiffness of right knee, not elsewhere classified: Secondary | ICD-10-CM | POA: Diagnosis not present

## 2023-02-22 DIAGNOSIS — M6281 Muscle weakness (generalized): Secondary | ICD-10-CM | POA: Diagnosis not present

## 2023-02-22 DIAGNOSIS — M25562 Pain in left knee: Secondary | ICD-10-CM | POA: Diagnosis not present

## 2023-02-22 DIAGNOSIS — R2689 Other abnormalities of gait and mobility: Secondary | ICD-10-CM | POA: Diagnosis not present

## 2023-02-22 DIAGNOSIS — M170 Bilateral primary osteoarthritis of knee: Secondary | ICD-10-CM | POA: Diagnosis not present

## 2023-02-25 DIAGNOSIS — F4321 Adjustment disorder with depressed mood: Secondary | ICD-10-CM | POA: Diagnosis not present

## 2023-03-05 DIAGNOSIS — F4321 Adjustment disorder with depressed mood: Secondary | ICD-10-CM | POA: Diagnosis not present

## 2023-03-10 ENCOUNTER — Telehealth: Payer: Self-pay | Admitting: *Deleted

## 2023-03-10 NOTE — Telephone Encounter (Signed)
PC to patient, informed her a brain MRI has been scheduled for her on 04/21/23 at 10:00 at [facility], she needs to arrive at 9:30.  Patient verbalizes understanding.

## 2023-03-19 DIAGNOSIS — F4321 Adjustment disorder with depressed mood: Secondary | ICD-10-CM | POA: Diagnosis not present

## 2023-03-23 ENCOUNTER — Encounter: Payer: Self-pay | Admitting: Internal Medicine

## 2023-03-23 ENCOUNTER — Telehealth: Payer: Self-pay | Admitting: *Deleted

## 2023-03-23 ENCOUNTER — Telehealth: Payer: Self-pay | Admitting: Licensed Clinical Social Worker

## 2023-03-23 DIAGNOSIS — C8339 Primary central nervous system lymphoma: Secondary | ICD-10-CM

## 2023-03-23 NOTE — Telephone Encounter (Signed)
PC to patient after reading her MyChart message from today, patient states her leg pain & neuropathy are better since taking an OTC medication which she isn't sure of the name or ingredients, but says this is helping.  She states she is very frustrated, feeling down, she can't do things, feels exhausted, is unable to make good decisions, she misses feeling useful.  Patient states she isn't sure about taking an antidepressant, will speak with Dr Barbaraann Cao about this at her upcoming appointment in January.  She says she is still trying to do things she enjoys, such as playing bridge, she is online a lot & has been buying "a lot of things with my credit card."  She has contacted [facility] to see if she can get on a waiting list but hasn't heard anything.  When asked about harming herself, she states that suicide "has crossed her mind."  She receives counseling for seniors through [facility], also states she did Grief Share classes after losing her husband a few months ago.  Dr Barbaraann Cao informed of this conversation with the patient, referral for SW entered.

## 2023-03-23 NOTE — Telephone Encounter (Signed)
CSW contacted pt regarding concerns expressed to RN about depression. Pt stating she is cooking dinner and would like to speak w/ CSW in the morning on 12/11. CSW to call ot 12/11.

## 2023-03-24 ENCOUNTER — Inpatient Hospital Stay: Payer: Medicare PPO | Attending: Neurosurgery | Admitting: Licensed Clinical Social Worker

## 2023-03-24 DIAGNOSIS — C8339 Primary central nervous system lymphoma: Secondary | ICD-10-CM

## 2023-03-24 NOTE — Progress Notes (Signed)
[facility] CSW Progress Note  Clinical Child psychotherapist contacted patient by phone to check in.  Pt sent a mychart message to the oncologist which suggested she may be struggling emotionally.   Pt states she resides alone following the passing of her husband last March.  Pt has a daughter residing in [place] and a son in [place].  M/W/Th/F pt has an aide at the home to assist and a housekeeper at the home on Tuesdays.  Pt reports she is alone overnight and on the weekends, but has the financial means to pay for additional assistance if she needs.  Pt values her independence and appreciates the "alone" time she is still able to have.  Pt does report experiencing increased depression and feeling as though she does not have purpose.  Pt's ambulation is impacted and pt is uncertain if she can anticipate any additional recovery.  Pt loves to garden and at present would be unable to safely go outside and work in the garden.  Pt speaks w/ a counselor from [facility] regularly and has attended a Grief Share group, both of which she describes as beneficial, but pt struggles with quality of life and the uncertainty of if she will regain physical ability.  Pt will have an MRI on January 8th and then will follow up with the oncologist on January 14th.  Pt states she sent the mychart message to give her oncologist time to review her concerns prior to their appointment.  CSW discussed alternative ways for pt to participate in some of the activities she enjoys as well as strategies to consistently do exercises to improve balance if additional recovery can be expected.  CSW to discuss the above w/ oncologist and will follow up w/ pt just prior to next appointment w/ oncologist.  Pt provided w/ contact details for CSW should she wish to speak sooner.      Rachel Moulds, LCSW Clinical Social Worker [facility]

## 2023-03-26 DIAGNOSIS — F4321 Adjustment disorder with depressed mood: Secondary | ICD-10-CM | POA: Diagnosis not present

## 2023-03-29 DIAGNOSIS — Z08 Encounter for follow-up examination after completed treatment for malignant neoplasm: Secondary | ICD-10-CM | POA: Diagnosis not present

## 2023-03-29 DIAGNOSIS — D225 Melanocytic nevi of trunk: Secondary | ICD-10-CM | POA: Diagnosis not present

## 2023-03-29 DIAGNOSIS — L814 Other melanin hyperpigmentation: Secondary | ICD-10-CM | POA: Diagnosis not present

## 2023-03-29 DIAGNOSIS — Z85828 Personal history of other malignant neoplasm of skin: Secondary | ICD-10-CM | POA: Diagnosis not present

## 2023-03-29 DIAGNOSIS — L821 Other seborrheic keratosis: Secondary | ICD-10-CM | POA: Diagnosis not present

## 2023-04-02 DIAGNOSIS — F4321 Adjustment disorder with depressed mood: Secondary | ICD-10-CM | POA: Diagnosis not present

## 2023-04-09 DIAGNOSIS — F4321 Adjustment disorder with depressed mood: Secondary | ICD-10-CM | POA: Diagnosis not present

## 2023-04-16 DIAGNOSIS — F4321 Adjustment disorder with depressed mood: Secondary | ICD-10-CM | POA: Diagnosis not present

## 2023-04-21 ENCOUNTER — Ambulatory Visit (HOSPITAL_COMMUNITY)
Admission: RE | Admit: 2023-04-21 | Discharge: 2023-04-21 | Disposition: A | Discharge status: Home / Self Care | Payer: Medicare PPO | Source: Ambulatory Visit | Attending: Internal Medicine | Admitting: Internal Medicine

## 2023-04-21 DIAGNOSIS — R9089 Other abnormal findings on diagnostic imaging of central nervous system: Secondary | ICD-10-CM | POA: Diagnosis not present

## 2023-04-21 DIAGNOSIS — C8339 Primary central nervous system lymphoma: Secondary | ICD-10-CM | POA: Diagnosis not present

## 2023-04-21 DIAGNOSIS — I6782 Cerebral ischemia: Secondary | ICD-10-CM | POA: Diagnosis not present

## 2023-04-21 DIAGNOSIS — G319 Degenerative disease of nervous system, unspecified: Secondary | ICD-10-CM | POA: Diagnosis not present

## 2023-04-21 MED ORDER — GADOBUTROL 1 MMOL/ML IV SOLN
6.0000 mL | Freq: Once | INTRAVENOUS | Status: AC | PRN
  Administered 2023-04-21: 6 mL via INTRAVENOUS

## 2023-04-21 MED ORDER — HEPARIN SOD (PORK) LOCK FLUSH 100 UNIT/ML IV SOLN
500.0000 [IU] | Freq: Once | INTRAVENOUS | Status: AC
  Administered 2023-04-21: 500 [IU] via INTRAVENOUS

## 2023-04-21 MED ORDER — HEPARIN SOD (PORK) LOCK FLUSH 100 UNIT/ML IV SOLN
INTRAVENOUS | Status: AC
  Filled 2023-04-21: qty 5

## 2023-04-23 DIAGNOSIS — F4321 Adjustment disorder with depressed mood: Secondary | ICD-10-CM | POA: Diagnosis not present

## 2023-04-27 ENCOUNTER — Inpatient Hospital Stay: Payer: Medicare PPO | Attending: Neurosurgery | Admitting: Internal Medicine

## 2023-04-27 ENCOUNTER — Encounter: Payer: Self-pay | Admitting: Internal Medicine

## 2023-04-27 VITALS — BP 136/62 | HR 66 | Temp 98.1°F | Resp 18 | Wt 126.2 lb

## 2023-04-27 DIAGNOSIS — Z809 Family history of malignant neoplasm, unspecified: Secondary | ICD-10-CM | POA: Diagnosis not present

## 2023-04-27 DIAGNOSIS — M25569 Pain in unspecified knee: Secondary | ICD-10-CM | POA: Insufficient documentation

## 2023-04-27 DIAGNOSIS — R2681 Unsteadiness on feet: Secondary | ICD-10-CM | POA: Diagnosis not present

## 2023-04-27 DIAGNOSIS — Z87891 Personal history of nicotine dependence: Secondary | ICD-10-CM | POA: Insufficient documentation

## 2023-04-27 DIAGNOSIS — Z79890 Hormone replacement therapy (postmenopausal): Secondary | ICD-10-CM | POA: Insufficient documentation

## 2023-04-27 DIAGNOSIS — Z8249 Family history of ischemic heart disease and other diseases of the circulatory system: Secondary | ICD-10-CM | POA: Diagnosis not present

## 2023-04-27 DIAGNOSIS — I6782 Cerebral ischemia: Secondary | ICD-10-CM | POA: Insufficient documentation

## 2023-04-27 DIAGNOSIS — Z808 Family history of malignant neoplasm of other organs or systems: Secondary | ICD-10-CM | POA: Diagnosis not present

## 2023-04-27 DIAGNOSIS — Z882 Allergy status to sulfonamides status: Secondary | ICD-10-CM | POA: Diagnosis not present

## 2023-04-27 DIAGNOSIS — K219 Gastro-esophageal reflux disease without esophagitis: Secondary | ICD-10-CM | POA: Insufficient documentation

## 2023-04-27 DIAGNOSIS — Z8262 Family history of osteoporosis: Secondary | ICD-10-CM | POA: Insufficient documentation

## 2023-04-27 DIAGNOSIS — Z79899 Other long term (current) drug therapy: Secondary | ICD-10-CM | POA: Diagnosis not present

## 2023-04-27 DIAGNOSIS — Z8261 Family history of arthritis: Secondary | ICD-10-CM | POA: Diagnosis not present

## 2023-04-27 DIAGNOSIS — M545 Low back pain, unspecified: Secondary | ICD-10-CM | POA: Insufficient documentation

## 2023-04-27 DIAGNOSIS — I10 Essential (primary) hypertension: Secondary | ICD-10-CM | POA: Diagnosis not present

## 2023-04-27 DIAGNOSIS — C8339 Primary central nervous system lymphoma: Secondary | ICD-10-CM | POA: Diagnosis not present

## 2023-04-27 DIAGNOSIS — Z8616 Personal history of COVID-19: Secondary | ICD-10-CM | POA: Diagnosis not present

## 2023-04-27 DIAGNOSIS — R2689 Other abnormalities of gait and mobility: Secondary | ICD-10-CM | POA: Diagnosis not present

## 2023-04-27 NOTE — Progress Notes (Signed)
 [facility] at [facility] [address]  [place], [place] [address] [phone]   Interval Evaluation  Date of Service: 04/27/23 Patient Name: Daisy Oliver Patient MRN: [id number] Patient DOB: Jan 29, 1936 Provider: Arthea MARLA Manns, MD  Identifying Statement:  Daisy Oliver is a 88 y.o. female with right frontal  CNS lymphoma    Oncologic History: Oncology History  Primary CNS lymphoma  09/02/2021 Surgery   Stereotactic biopsy by Dr. Debby; path is B-cell lymphoma CD20+   09/30/2021 - 12/05/2021 Chemotherapy   Patient is on Treatment Plan : IP NON-HODGKINS LYMPHOMA High Dose Methotrexate  + Leucovorin  Rescue     10/02/2021 - 01/01/2022 Chemotherapy   Patient is on Treatment Plan : NON-HODGKINS LYMPHOMA Rituximab  q21d     01/01/2022 -  Chemotherapy   Patient is on Treatment Plan : BRAIN GLIOBLASTOMA Consolidation Temozolomide  Days 1-5 q28 Days      01/29/2022 - 02/23/2022 Radiation Therapy   Whole brain radiation with right frontal boost with Dr. Izell     Biomarkers:  CD20 positive .  Ki-67 50% .   Unknown   Unknown   Interval History: Daisy Oliver presents today for follow up after recent MRI brain.  Denies any new or progressiv changes today.  Continues to experience chronic orthopedic issues. Currently independent with gait and functional status, is utilizing a cane most of the time.  Denies seizures, headaches.  H+P (09/16/21) Patient presented to medical attention in May 2023 with several weeks of progressive gait instability.  She describes feeling unsteady on her feet, even falling a couple of times.  After one of the falls, ED visit led to CNS imaging which demonstrated multifocal masses in the right hemisphere.  She underwent stereotactic biopsy with Dr. Debby on 09/02/21; path demonstrated diffuse large B-cell lymphoma, CD20+.  Since surgery, she has tapered off the steroids without issue.  She feels fairly normal aside  from pain in her knee and lower back.  She uses a cane to walk, mainly because of the knee pain.  No issues with frank weakness, no seizures.  She lives at home with her husband who has dementia.  Daughter is nearby in [place] .  Medications: Current Outpatient Medications on File Prior to Visit  Medication Sig Dispense Refill   acetaminophen  (TYLENOL ) 500 MG tablet Take 500 mg by mouth 2 (two) times daily as needed (pain).     Ascorbic Acid (VITAMIN C PO) Take 1 tablet by mouth daily.     BIOTIN PO Take 1 tablet by mouth 2 (two) times daily.     famotidine  (PEPCID ) 20 MG tablet Take 20 mg by mouth at bedtime as needed for indigestion.     gabapentin  (NEURONTIN ) 100 MG capsule TAKE 1 CAPSULE BY MOUTH TWICE A DAY 60 capsule 3   liothyronine  (CYTOMEL ) 5 MCG tablet TAKE 5 MCG BY MOUTH IN THE MORNING AND AT BEDTIME. 180 tablet 2   MAGNESIUM  PO Take 1 capsule by mouth 2 (two) times daily.     Melatonin 10 MG TABS Take 10 mg by mouth at bedtime.     Menthol, Topical Analgesic, (BIOFREEZE EX) Apply 1 Application topically at bedtime as needed (leg pain).     Misc Natural Products (OSTEO BI-FLEX JOINT SHIELD PO) Take 1 tablet by mouth 2 (two) times daily.     Multiple Vitamins-Minerals (ZINC  PO) Take 1 tablet by mouth 2 (two) times daily.     olmesartan  (BENICAR ) 20 MG tablet Take  1  tablet  Daily  for BP 90 tablet 3   Polyethyl Glycol-Propyl Glycol (SYSTANE) 0.4-0.3 % GEL ophthalmic gel Place 1 Application into both eyes at bedtime.     TAURINE  PO Take 1 capsule by mouth daily.     triamcinolone  (KENALOG ) 0.025 % ointment Apply 1 Application topically 2 (two) times daily. 30 g 0   No current facility-administered medications on file prior to visit.    Allergies:  Allergies  Allergen Reactions   Barbiturates Other (See Comments)    Unknown reaction   Other Other (See Comments)    Patient has AIP and is not to have hormones or red wine   Pentothal [Thiopental] Other (See Comments)    Unknown  reaction   Sulfa Antibiotics Other (See Comments)    Unknown reaction   Past Medical History:  Past Medical History:  Diagnosis Date   Acute intermittent porphyria (HCC)    diagnosed at age 36   Cancer [facility])    CNS lymphoma (HCC)    COVID-19 08/26/2020   Fracture of radial head, right, closed 09/16/2012   GERD (gastroesophageal reflux disease)    pepcid  prn   Hypertension    Hypothyroidism    PONV (postoperative nausea and vomiting)    Past Surgical History:  Past Surgical History:  Procedure Laterality Date   APPLICATION OF CRANIAL NAVIGATION Right 09/02/2021   Procedure: APPLICATION OF CRANIAL NAVIGATION;  Surgeon: Debby Dorn MATSU, MD;  Location: [facility] OR;  Service: Neurosurgery;  Laterality: Right;   CATARACT EXTRACTION, BILATERAL  2020   Dr. Cleatus   FRAMELESS  BIOPSY WITH BRAINLAB Right 09/02/2021   Procedure: FRONTAL STEREOTACTIC BRAIN BIOPSY;  Surgeon: Debby Dorn MATSU, MD;  Location: [facility] OR;  Service: Neurosurgery;  Laterality: Right;   IR IMAGING GUIDED PORT INSERTION  09/24/2021   RADIAL HEAD ARTHROPLASTY Right 09/16/2012   Procedure: RADIAL HEAD ARTHROPLASTY;  Surgeon: Fonda SHAUNNA Olmsted, MD;  Location: [facility];  Service: Orthopedics;  Laterality: Right;  RADIAL HEAD REPLACEMENT     Social History:  Social History   Socioeconomic History   Marital status: Married    Spouse name: Not on file   Number of children: 2   Years of education: Not on file   Highest education level: Not on file  Occupational History   Not on file  Tobacco Use   Smoking status: Former    Current packs/day: 0.00    Average packs/day: 2.0 packs/day for 22.0 years (44.0 ttl pk-yrs)    Types: Cigarettes    Start date: 09/16/1961    Quit date: 09/17/1983    Years since quitting: 39.6   Smokeless tobacco: Never  Vaping Use   Vaping status: Never Used  Substance and Sexual Activity   Alcohol use: Not Currently    Alcohol/week: 0.0 - 1.0 standard drinks of alcohol    Comment:  2/month   Drug use: No   Sexual activity: Not Currently  Other Topics Concern   Not on file  Social History Narrative   Not on file   Social Drivers of Health   Financial Resource Strain: Not on file  Food Insecurity: No Food Insecurity (11/10/2022)   Hunger Vital Sign    Worried About Running Out of Food in the Last Year: Never true    Ran Out of Food in the Last Year: Never true  Transportation Needs: No Transportation Needs (11/10/2022)   PRAPARE - Transportation    Lack of Transportation (Medical): No    Lack  of Transportation (Non-Medical): No  Physical Activity: Sufficiently Active (09/14/2017)   Exercise Vital Sign    Days of Exercise per Week: 5 days    Minutes of Exercise per Session: 60 min  Stress: No Stress Concern Present (09/14/2017)   [employer] of Occupational Health - Occupational Stress Questionnaire    Feeling of Stress : Only a little  Social Connections: Not on file  Intimate Partner Violence: Not At Risk (11/10/2022)   Humiliation, Afraid, Rape, and Kick questionnaire    Fear of Current or Ex-Partner: No    Emotionally Abused: No    Physically Abused: No    Sexually Abused: No   Family History:  Family History  Problem Relation Age of Onset   Heart disease Mother    Brain cancer Father 107       Brain tumor, not cancerous   Hypertension Sister    Osteoporosis Sister    Arthritis Sister    Scoliosis Sister    Hypertension Sister    Osteoporosis Sister    Cancer Paternal Grandfather    Suicidality Paternal Grandfather    Breast cancer Neg Hx     Review of Systems: Constitutional: Doesn't report fevers, chills or abnormal weight loss Eyes: Doesn't report blurriness of vision Ears, nose, mouth, throat, and face: Doesn't report sore throat Respiratory: Doesn't report cough, dyspnea or wheezes Cardiovascular: Doesn't report palpitation, chest discomfort  Gastrointestinal:  Doesn't report nausea, constipation, diarrhea GU: Doesn't report  incontinence Skin: Doesn't report skin rashes Neurological: Per HPI Musculoskeletal: Doesn't report joint pain Behavioral/Psych: Doesn't report anxiety  Physical Exam: There were no vitals filed for this visit.   KPS: 80. General: Alert, cooperative, pleasant, in no acute distress Head: Normal EENT: No conjunctival injection or scleral icterus.  Lungs: Resp effort normal Cardiac: Regular rate Abdomen: Non-distended abdomen Skin: No rashes cyanosis or petechiae. Extremities: No clubbing or edema  Neurologic Exam: Mental Status: Awake, alert, attentive to examiner. Oriented to self and environment. Language is fluent with intact comprehension.  Cranial Nerves: Visual acuity is grossly normal. Visual fields are full. Extra-ocular movements intact. No ptosis. Face is symmetric Motor: Tone and bulk are normal. Power is full in both arms and legs, with subtle drift noted in left arm. Reflexes are symmetric, no pathologic reflexes present.  Sensory: Intact to light touch Gait: Orthopedic limitation, cane assisted   Labs: I have reviewed the data as listed    Component Value Date/Time   NA 145 07/30/2022 1125   K 4.3 07/30/2022 1125   CL 104 07/30/2022 1125   CO2 32 07/30/2022 1125   GLUCOSE 85 07/30/2022 1125   BUN 18 07/30/2022 1125   CREATININE 0.79 07/30/2022 1125   CALCIUM  9.9 07/30/2022 1125   PROT 6.7 07/30/2022 1125   ALBUMIN 3.7 01/26/2022 1100   AST 18 07/30/2022 1125   AST 23 01/26/2022 1100   ALT 11 07/30/2022 1125   ALT 23 01/26/2022 1100   ALKPHOS 46 01/26/2022 1100   BILITOT 0.8 07/30/2022 1125   BILITOT 0.6 01/26/2022 1100   GFRNONAA >60 01/26/2022 1100   GFRNONAA 66 09/19/2020 0000   GFRAA 76 09/19/2020 0000   Lab Results  Component Value Date   WBC 8.8 07/30/2022   NEUTROABS 6,662 07/30/2022   HGB 12.6 07/30/2022   HCT 38.0 07/30/2022   MCV 95.0 07/30/2022   PLT 204 07/30/2022   Imaging:  [facility] Clinician Interpretation: I have personally  reviewed the CNS images as listed.  My interpretation, in the  context of the patient's clinical presentation, is stable disease pending official read  MR BRAIN W WO CONTRAST Result Date: 04/21/2023 CLINICAL DATA:  CNS neoplasm, assess treatment response EXAM: MRI HEAD WITHOUT AND WITH CONTRAST TECHNIQUE: Multiplanar, multiecho pulse sequences of the brain and surrounding structures were obtained without and with intravenous contrast. CONTRAST:  6mL GADAVIST  GADOBUTROL  1 MMOL/ML IV SOLN COMPARISON:  09/09/2022 FINDINGS: Brain: Subcentimeter nodular area of enhancement in the right frontal lobe treatment area is stable. Given stability and wispy appearance there is a high likelihood this is baseline scarring. No swelling or new area of enhancement. The degree of T2 hyperintensity in the right frontal white matter with volume loss is unchanged. Some atrophy and chronic small vessel ischemia in the background. No incidental acute infarct, hemorrhage, hydrocephalus, or collection. Vascular: Major flow voids and vascular enhancements are preserved Skull and upper cervical spine: Normal marrow signal Sinuses/Orbits: Bilateral cataract resection. Other: Symmetric T2 hyperintensity in the subcutaneous face attributed to cosmetic procedure. IMPRESSION: Stable and satisfactory appearance of right frontal treatment area. No new abnormality. Electronically Signed   By: Dorn Roulette M.D.   On: 04/21/2023 11:46     Assessment/Plan Primary CNS lymphoma  Daisy Oliver is clinically stable today.  MRI brain was reviewed in detail at bedside; it again demonstrates stable findings.    No progression in any neurologic deficit.  We ask that Dajha Boldt Lemelle return to clinic in 6 months following next brain MRI, or sooner as needed.  All questions were answered. The patient knows to call the clinic with any problems, questions or concerns. No barriers to learning were detected.  The total time spent in the  encounter was 40 minutes and more than 50% was on counseling and review of test results   Arthea MARLA Manns, MD Medical Director of Neuro-Oncology [facility] at [facility] 04/27/23 9:53 AM

## 2023-04-28 ENCOUNTER — Telehealth: Payer: Self-pay | Admitting: Internal Medicine

## 2023-04-28 NOTE — Telephone Encounter (Signed)
 Daisy Oliver

## 2023-04-29 ENCOUNTER — Other Ambulatory Visit: Payer: Self-pay

## 2023-05-05 ENCOUNTER — Ambulatory Visit: Payer: Medicare PPO | Admitting: Podiatry

## 2023-05-05 DIAGNOSIS — L600 Ingrowing nail: Secondary | ICD-10-CM

## 2023-05-05 DIAGNOSIS — D2372 Other benign neoplasm of skin of left lower limb, including hip: Secondary | ICD-10-CM | POA: Diagnosis not present

## 2023-05-05 NOTE — Progress Notes (Signed)
Chief Complaint  Patient presents with   Callouses    Pt is here to have callous removed from the side of her left foot.   Ingrown Toenail    Pt is here because she believes she has a ingrown on the left great toenail.    HPI: 88 y.o. female presenting today for evaluation of pain and tenderness associated to the left great toenail plate.  She is concerned for possible ingrown.  She says it has hurt for several months now.  Denies any history of injury.   She also developed symptomatic callus to the lateral aspect of the fifth MTP of the left foot.  She was last seen in the office on 11/17/2021 and at that time it was debrided and she felt significantly better.  She presents for further treatment evaluation  Past Medical History:  Diagnosis Date   Acute intermittent porphyria (HCC)    diagnosed at age 78   Cancer [facility])    CNS lymphoma (HCC)    COVID-19 08/26/2020   Fracture of radial head, right, closed 09/16/2012   GERD (gastroesophageal reflux disease)    pepcid prn   Hypertension    Hypothyroidism    PONV (postoperative nausea and vomiting)     Past Surgical History:  Procedure Laterality Date   APPLICATION OF CRANIAL NAVIGATION Right 09/02/2021   Procedure: APPLICATION OF CRANIAL NAVIGATION;  Surgeon: Bedelia Person, MD;  Location: [facility] OR;  Service: Neurosurgery;  Laterality: Right;   CATARACT EXTRACTION, BILATERAL  2020   Dr. Elmer Picker   FRAMELESS  BIOPSY WITH BRAINLAB Right 09/02/2021   Procedure: FRONTAL STEREOTACTIC BRAIN BIOPSY;  Surgeon: Bedelia Person, MD;  Location: [facility] OR;  Service: Neurosurgery;  Laterality: Right;   IR IMAGING GUIDED PORT INSERTION  09/24/2021   RADIAL HEAD ARTHROPLASTY Right 09/16/2012   Procedure: RADIAL HEAD ARTHROPLASTY;  Surgeon: Eulas Post, MD;  Location: [facility];  Service: Orthopedics;  Laterality: Right;  RADIAL HEAD REPLACEMENT      Allergies  Allergen Reactions   Barbiturates Other (See Comments)    Unknown  reaction   Other Other (See Comments)    Patient has AIP and is not to have hormones or red wine   Pentothal [Thiopental] Other (See Comments)    Unknown reaction   Sulfa Antibiotics Other (See Comments)    Unknown reaction     Physical Exam: General: The patient is alert and oriented x3 in no acute distress.  Dermatology: Skin is warm, dry and supple bilateral lower extremities.  Hyperkeratotic symptomatic skin lesion noted to the lateral aspect of the fifth MTP of the left foot with a central nucleated core. There is also tenderness with palpation of the left hallux nail plate.  Left hallux nail plate is actually loosely adhered and very tender.  Vascular: Palpable pedal pulses bilaterally. Capillary refill within normal limits.  No appreciable edema.  No erythema.  Neurological: Grossly intact via light touch  Musculoskeletal Exam: No pedal deformities noted   Assessment/Plan of Care: 1.  Loosely adhered symptomatic toenail plate left hallux 2.  Symptomatic eccrine poroma fifth MTP left  -Patient evaluated. -After evaluating the left hallux nail plate I do believe that total temporary nail avulsion will benefit the patient and alleviate her pain and symptoms.  This will also allow new nail to grow.  The procedure was discussed in detail with the patient and she agrees would like to have the nail completely avulsed -The toe was prepped in  aseptic manner and digital block performed using 3 mL of 2% lidocaine plain.  The nail was avulsed in its entirety and dressings applied.  Post care instructions provided -As a courtesy for the patient excisional debridement of the hyperkeratotic skin lesion was performed today using a 312 scalpel without incident or bleeding.  Patient felt significant relief -Return to clinic as needed  *Lives on [address] [place] [address]       Felecia Shelling, [place] [facility]  Dr. Felecia Shelling, DPM    [address] [place], [place] [address]                Office [phone]  Fax [phone]

## 2023-05-07 DIAGNOSIS — F4321 Adjustment disorder with depressed mood: Secondary | ICD-10-CM | POA: Diagnosis not present

## 2023-05-10 ENCOUNTER — Telehealth: Payer: Self-pay | Admitting: Podiatry

## 2023-05-10 NOTE — Telephone Encounter (Signed)
Patient called she had a toe nail removed last week. Patient wants to know what she should be doing to take care of her foot.

## 2023-05-12 ENCOUNTER — Ambulatory Visit (HOSPITAL_COMMUNITY)
Admission: RE | Admit: 2023-05-12 | Discharge: 2023-05-12 | Disposition: A | Discharge status: Home / Self Care | Payer: Medicare PPO | Source: Ambulatory Visit | Attending: Internal Medicine | Admitting: Internal Medicine

## 2023-05-12 DIAGNOSIS — Z452 Encounter for adjustment and management of vascular access device: Secondary | ICD-10-CM | POA: Insufficient documentation

## 2023-05-12 DIAGNOSIS — C8339 Primary central nervous system lymphoma: Secondary | ICD-10-CM | POA: Insufficient documentation

## 2023-05-12 HISTORY — PX: IR REMOVAL TUN ACCESS W/ PORT W/O FL MOD SED: IMG2290

## 2023-05-12 MED ORDER — LIDOCAINE-EPINEPHRINE 1 %-1:100000 IJ SOLN
INTRAMUSCULAR | Status: AC
  Filled 2023-05-12: qty 1

## 2023-05-12 MED ORDER — HEPARIN SOD (PORK) LOCK FLUSH 100 UNIT/ML IV SOLN
INTRAVENOUS | Status: AC
  Filled 2023-05-12: qty 5

## 2023-05-12 MED ORDER — LIDOCAINE-EPINEPHRINE 1 %-1:100000 IJ SOLN
20.0000 mL | Freq: Once | INTRAMUSCULAR | Status: AC
  Administered 2023-05-12: 10 mL via INTRADERMAL

## 2023-05-12 NOTE — Procedures (Signed)
Interventional Radiology Procedure Note  Procedure: Removal of a right IJ approach single lumen PowerPort.    Complications: None Recommendations:  - Ok to shower tomorrow - Do not submerge for 7 days - Routine wound care   Signed,  Yvone Neu. Loreta Ave, DO

## 2023-05-13 DIAGNOSIS — F4321 Adjustment disorder with depressed mood: Secondary | ICD-10-CM | POA: Diagnosis not present

## 2023-05-31 ENCOUNTER — Other Ambulatory Visit: Payer: Self-pay | Admitting: Internal Medicine

## 2023-05-31 DIAGNOSIS — Z1231 Encounter for screening mammogram for malignant neoplasm of breast: Secondary | ICD-10-CM

## 2023-06-15 IMAGING — MR MR HEAD WO/W CM
17 of 18 series · 34 of 48 positions shown · IV contrast (Y GAD)
Comparison: 08/27/2021

CLINICAL DATA: Intracranial neoplasm planning for biopsy

EXAM:
MRI HEAD WITHOUT AND WITH CONTRAST
TECHNIQUE: Multiplanar, multiecho pulse sequences of the brain and surrounding
structures were obtained without and with intravenous contrast.
CONTRAST:  7mL GADAVIST GADOBUTROL 1 MMOL/ML IV SOLN

[Series 2: FLAIR · sagittal · 3.0mm · 0.47mm/px · 1 of 42 slices shown (1 of 3)]
[im 1/42]
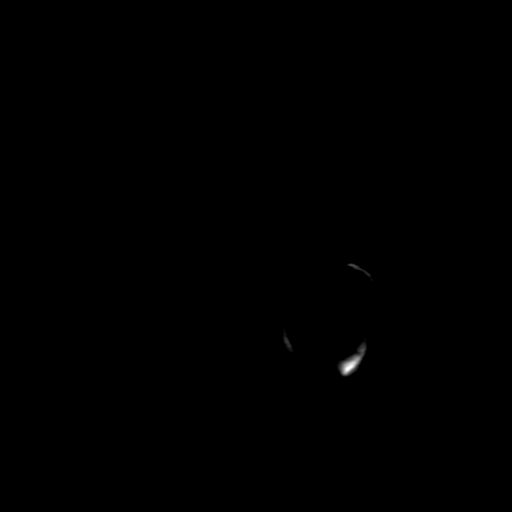

[Series 3: T2 · axial · 5.0mm · 0.47mm/px · 1 of 35 slices shown]
[im 1/35]
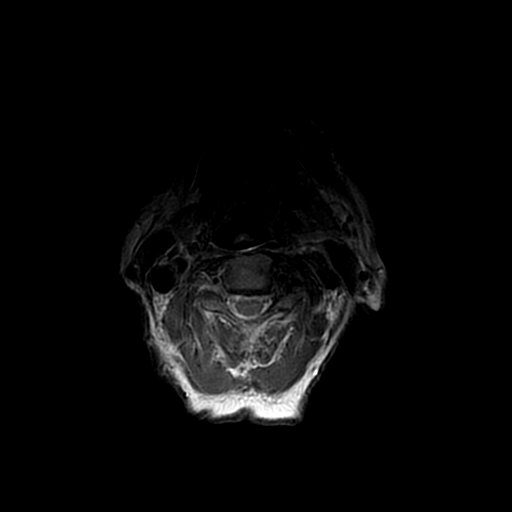

[Series 4: ax dti · axial · 3.0mm · 0.94mm/px · z∈[-110,+52]mm · 8 of 1430 slices shown]
[im 1/1430]
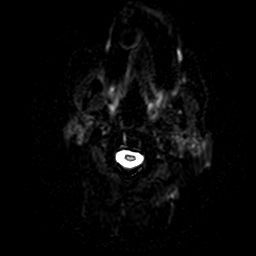
[im 239/1430]
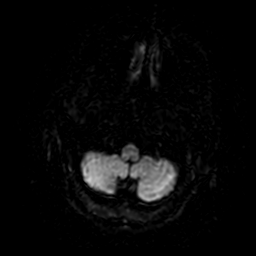
[im 477/1430]
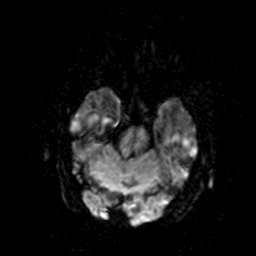
[im 596/1430]
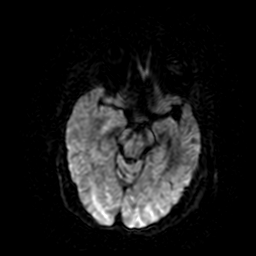
[im 834/1430]
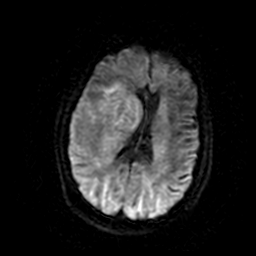
[im 953/1430]
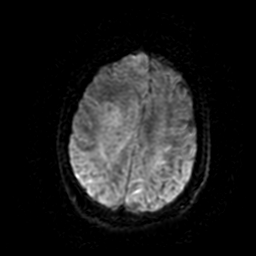
[im 1191/1430]
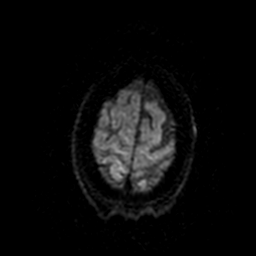
[im 1430/1430]
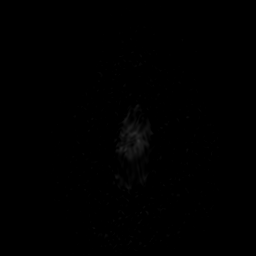

[Series 6: (person_name) · axial · 3.0mm · 0.47mm/px · 1 of 114 slices shown]
[im 1/114]
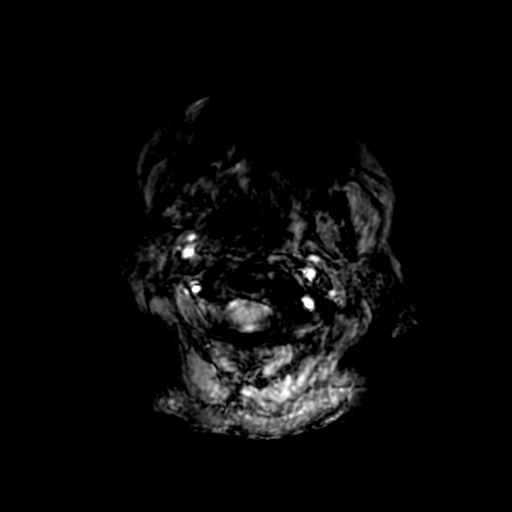

[Series 7: ax 3(person_name) · axial · 1.0mm · 1.02mm/px · 1 of 226 slices shown (1 of 2)]
[im 1/226]
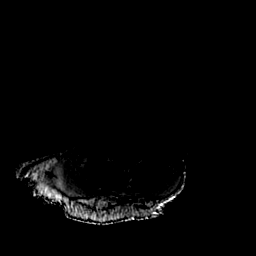

[Series 8: FLAIR · sagittal · 3.0mm · 0.47mm/px · 1 of 42 slices shown (2 of 3)]
[im 1/42]
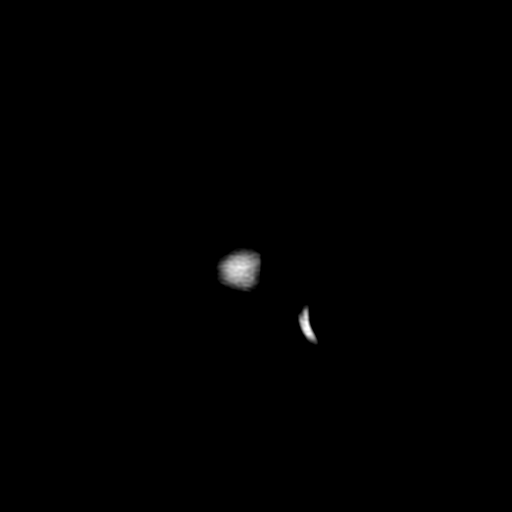

[Series 9: T2 post-contrast · coronal · 3.0mm · 0.39mm/px · 1 of 77 slices shown]
[im 1/77]
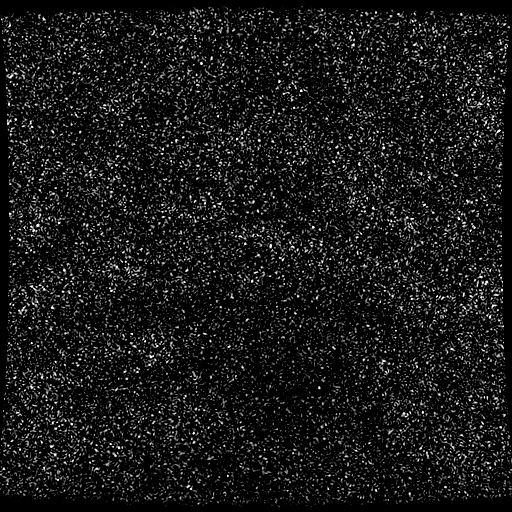

[Series 10: ax 3(person_name) · axial · 1.0mm · 1.02mm/px · 1 of 226 slices shown (2 of 2)]
[im 1/226]
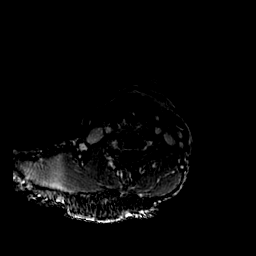

[Series 11: T1 · coronal · 5.0mm · 0.43mm/px · 1 of 39 slices shown]
[im 1/39]
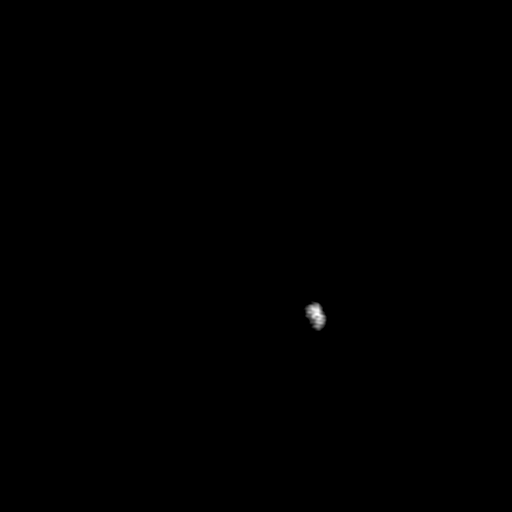

[Series 12: FLAIR · sagittal · 5.0mm · 0.47mm/px · 1 of 28 slices shown (3 of 3)]
[im 1/28]
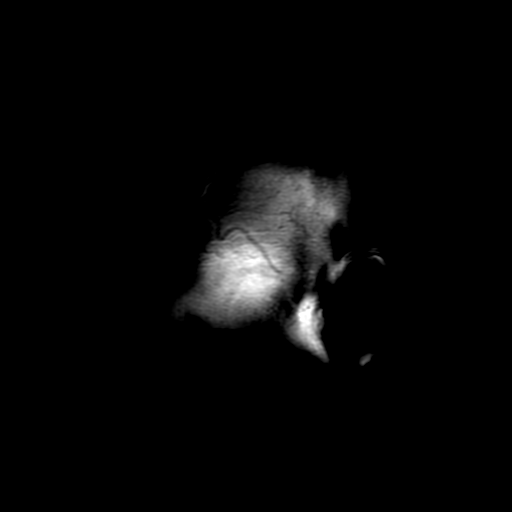

[Series 410: orig: ax dti · axial · 3.0mm · 0.94mm/px · z∈[-110,+52]mm · 8 of 1430 slices shown]
[im 1/1430]
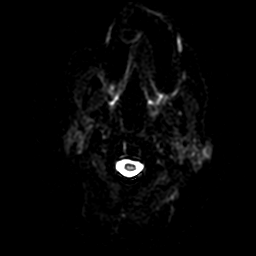
[im 220/1430]
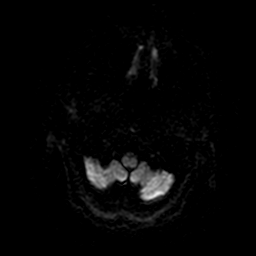
[im 440/1430]
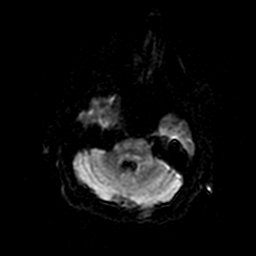
[im 660/1430]
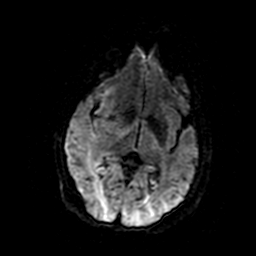
[im 770/1430]
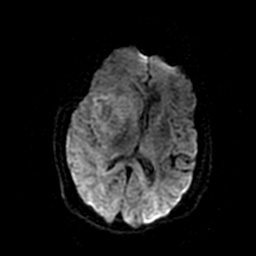
[im 990/1430]
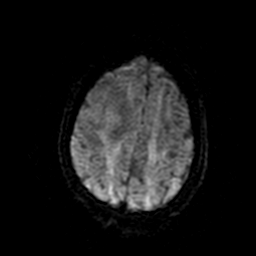
[im 1210/1430]
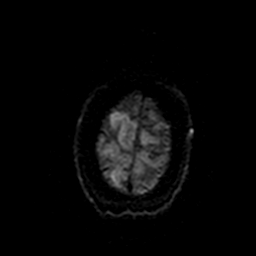
[im 1430/1430]
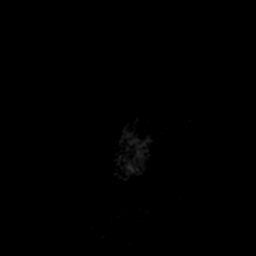

[Series 450: trace · axial · 3.0mm · 0.94mm/px · 1 of 55 slices shown]
[im 1/55]
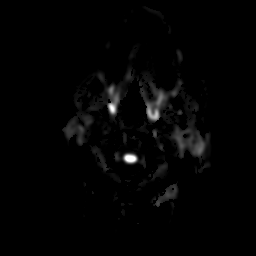

[Series 451: fa(no-q) · axial · 3.0mm · 0.94mm/px · 1 of 50 slices shown]
[im 1/50]
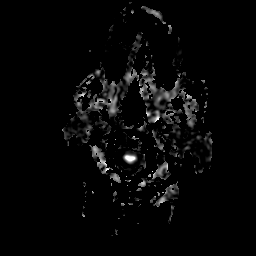

[Series 452: avdc (10^-6 mm²/s)(no-q) · axial · 3.0mm · 0.94mm/px · 1 of 55 slices shown]
[im 1/55]
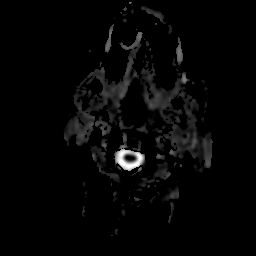

[Series 500: multiplanar reconstruction (mpr) · axial · 1.0mm · 0.50mm/px · z∈[-177,+79]mm · 3 of 257 slices shown (1 of 2)]
[im 1/257]
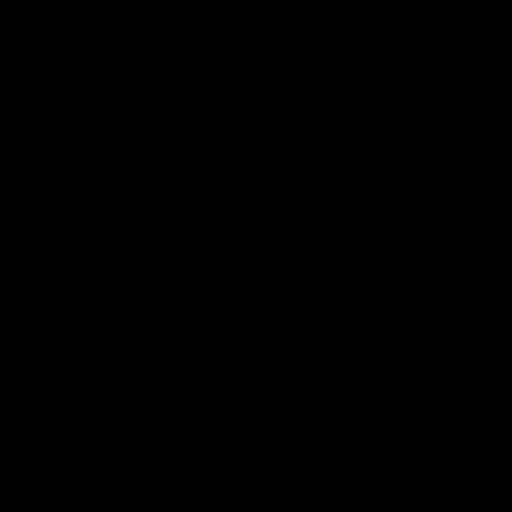
[im 129/257]
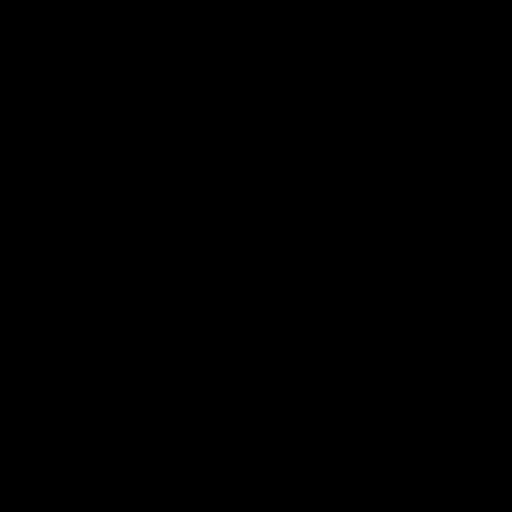
[im 257/257]
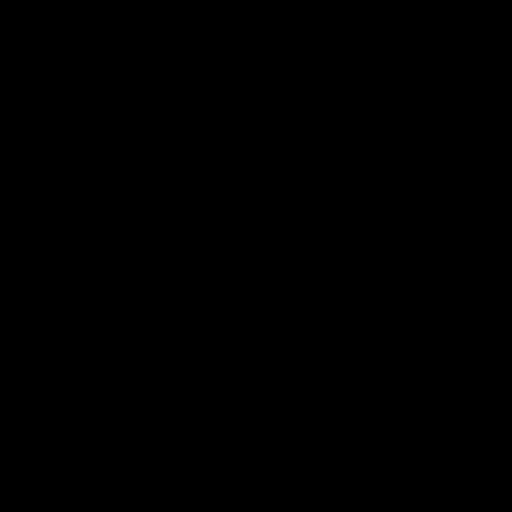

[Series 501: multiplanar reconstruction (mpr) · coronal · 1.0mm · 0.50mm/px · 2 of 243 slices shown (2 of 2)]
[im 1/243]
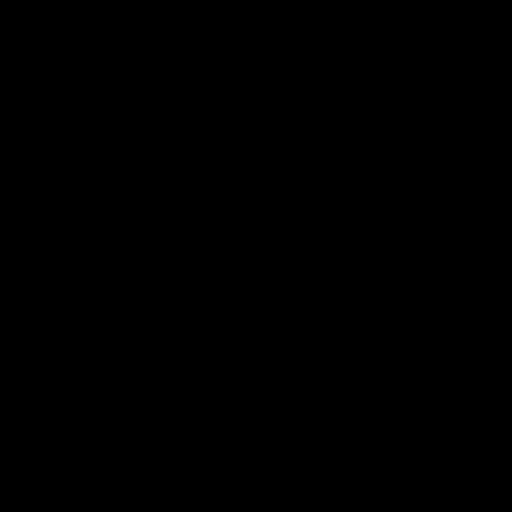
[im 243/243]
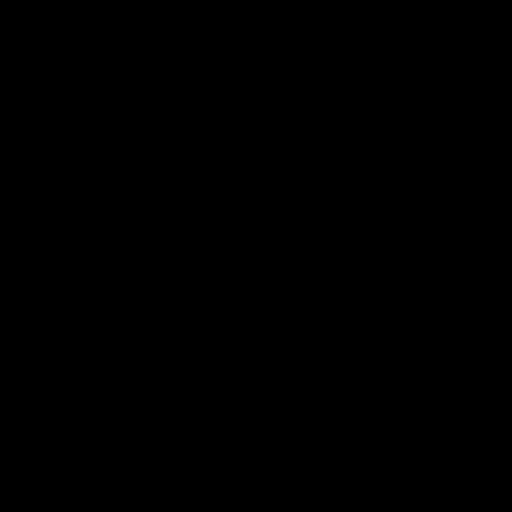

[Series 600: filt_pha: (person_name) · axial · 3.0mm · 0.47mm/px · 1 of 108 slices shown]
[im 1/108]
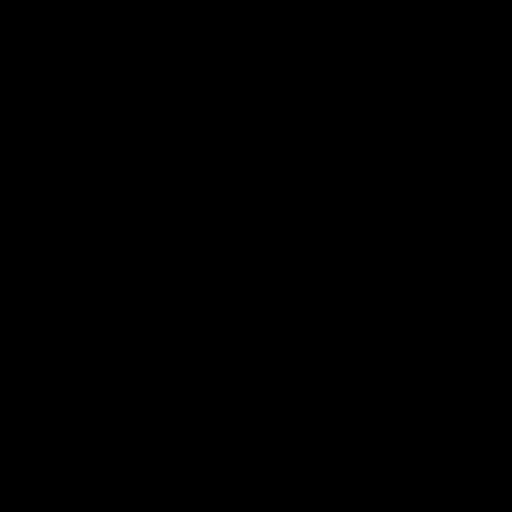

[34 of 48 positions shown; findings below may reference images not displayed]

FINDINGS: Brain: Dominant contrast enhancing mass centered in the right basal
ganglia is unchanged. A satellite lesion at the anterior right
frontal lobe is also unchanged. Unchanged large amount of
surrounding edema causing 8 mm of leftward midline shift.

Vascular: Major flow voids are preserved.

Skull and upper cervical spine: Normal calvarium and skull base.
Visualized upper cervical spine and soft tissues are normal.

Sinuses/Orbits:No paranasal sinus fluid levels or advanced mucosal
thickening. No mastoid or middle ear effusion. Normal orbits.
IMPRESSION: 1. Unchanged size and appearance of dominant mass centered in the
right basal ganglia with large amount of surrounding edema and 8 mm
of leftward midline shift.
2. Satellite lesion at the anterior right frontal lobe is also
unchanged. Lymphoma is most likely.

## 2023-07-06 ENCOUNTER — Other Ambulatory Visit: Payer: Self-pay | Admitting: Internal Medicine

## 2023-07-08 ENCOUNTER — Encounter: Payer: Self-pay | Admitting: Internal Medicine

## 2023-07-14 ENCOUNTER — Ambulatory Visit
Admission: RE | Admit: 2023-07-14 | Discharge: 2023-07-14 | Disposition: A | Discharge status: Home / Self Care | Payer: Medicare PPO | Source: Ambulatory Visit | Attending: Internal Medicine | Admitting: Internal Medicine

## 2023-07-14 DIAGNOSIS — Z1231 Encounter for screening mammogram for malignant neoplasm of breast: Secondary | ICD-10-CM

## 2023-07-22 ENCOUNTER — Encounter (INDEPENDENT_AMBULATORY_CARE_PROVIDER_SITE_OTHER): Payer: Medicare PPO | Admitting: Ophthalmology

## 2023-07-22 ENCOUNTER — Encounter (INDEPENDENT_AMBULATORY_CARE_PROVIDER_SITE_OTHER): Admitting: Ophthalmology

## 2023-07-26 ENCOUNTER — Inpatient Hospital Stay: Attending: Neurosurgery | Admitting: Licensed Clinical Social Worker

## 2023-07-26 DIAGNOSIS — C8339 Primary central nervous system lymphoma: Secondary | ICD-10-CM

## 2023-07-26 NOTE — Progress Notes (Signed)
 [facility] CSW Progress Note  Clinical Child psychotherapist contacted patient by phone to discuss request for assistance finding a Veterinary surgeon.  Pt states she was using a telehealth platform to see a counselor weekly through [facility], but was dismissed from their practice as pt was not consistently logging in at the scheduled appointment time.  Pt requesting in person counseling if possible.  CSW to contact counselors on behalf of pt to try to schedule an appointment.  CSW to follow up w/ pt once a counselor is booked.     Quenton Bruns, LCSW Clinical Social Worker [facility]

## 2023-08-02 ENCOUNTER — Inpatient Hospital Stay: Admitting: Licensed Clinical Social Worker

## 2023-08-02 ENCOUNTER — Encounter (INDEPENDENT_AMBULATORY_CARE_PROVIDER_SITE_OTHER): Admitting: Ophthalmology

## 2023-08-02 DIAGNOSIS — C8339 Primary central nervous system lymphoma: Secondary | ICD-10-CM

## 2023-08-02 NOTE — Progress Notes (Signed)
 [facility] CSW Progress Note  Clinical Social Worker  spoke w/ [facility] [phone]) who confirmed they do take pt's insurance and would be able to get her in for an appointment w/in the next 2 1/2 to 3 weeks.  CSW contacted pt to relay the above information.  Pt states she will call to book an appointment and will contact CSW if there are any concerns.      Quenton Bruns, LCSW Clinical Social Worker [facility]

## 2023-08-04 ENCOUNTER — Encounter (INDEPENDENT_AMBULATORY_CARE_PROVIDER_SITE_OTHER): Admitting: Ophthalmology

## 2023-08-04 DIAGNOSIS — H35033 Hypertensive retinopathy, bilateral: Secondary | ICD-10-CM | POA: Diagnosis not present

## 2023-08-04 DIAGNOSIS — H348322 Tributary (branch) retinal vein occlusion, left eye, stable: Secondary | ICD-10-CM | POA: Diagnosis not present

## 2023-08-04 DIAGNOSIS — I10 Essential (primary) hypertension: Secondary | ICD-10-CM

## 2023-08-04 DIAGNOSIS — H43813 Vitreous degeneration, bilateral: Secondary | ICD-10-CM

## 2023-08-05 ENCOUNTER — Other Ambulatory Visit: Payer: Self-pay

## 2023-08-18 ENCOUNTER — Ambulatory Visit: Admitting: Podiatry

## 2023-08-18 DIAGNOSIS — D2372 Other benign neoplasm of skin of left lower limb, including hip: Secondary | ICD-10-CM | POA: Diagnosis not present

## 2023-08-20 NOTE — Progress Notes (Signed)
   No chief complaint on file.   Subjective: 88 y.o. female presenting to the office today for evaluation of pain and tenderness associated to a symptomatic skin lesion to the plantar aspect of the left foot.   Past Medical History:  Diagnosis Date   Acute intermittent porphyria (HCC)    diagnosed at age 21   Cancer [facility])    CNS lymphoma    COVID-19 08/26/2020   Fracture of radial head, right, closed 09/16/2012   GERD (gastroesophageal reflux disease)    pepcid  prn   Hypertension    Hypothyroidism    PONV (postoperative nausea and vomiting)     Past Surgical History:  Procedure Laterality Date   APPLICATION OF CRANIAL NAVIGATION Right 09/02/2021   Procedure: APPLICATION OF CRANIAL NAVIGATION;  Surgeon: Van Gelinas, MD;  Location: [facility] OR;  Service: Neurosurgery;  Laterality: Right;   CATARACT EXTRACTION, BILATERAL  2020   Dr. Lasandra Points   FRAMELESS  BIOPSY WITH BRAINLAB Right 09/02/2021   Procedure: FRONTAL STEREOTACTIC BRAIN BIOPSY;  Surgeon: Van Gelinas, MD;  Location: [facility] OR;  Service: Neurosurgery;  Laterality: Right;   IR IMAGING GUIDED PORT INSERTION  09/24/2021   IR REMOVAL TUN ACCESS W/ PORT W/O FL MOD SED  05/12/2023   RADIAL HEAD ARTHROPLASTY Right 09/16/2012   Procedure: RADIAL HEAD ARTHROPLASTY;  Surgeon: Neville Barbone, MD;  Location: [facility];  Service: Orthopedics;  Laterality: Right;  RADIAL HEAD REPLACEMENT      Allergies  Allergen Reactions   Barbiturates Other (See Comments)    Unknown reaction   Other Other (See Comments)    Patient has AIP and is not to have hormones or red wine   Pentothal [Thiopental] Other (See Comments)    Unknown reaction   Sulfa Antibiotics Other (See Comments)    Unknown reaction     Objective:  Physical Exam General: Alert and oriented x3 in no acute distress  Dermatology: Hyperkeratotic lesion(s) present on the plantar aspect of the left foot. Pain on palpation with a central nucleated core noted.  Skin is warm, dry and supple bilateral lower extremities. Negative for open lesions or macerations.  Vascular: Palpable pedal pulses bilaterally. No edema or erythema noted. Capillary refill within normal limits.  Neurological: Grossly intact via light touch  Musculoskeletal Exam: Pain on palpation at the keratotic lesion(s) noted. Range of motion within normal limits bilateral. Muscle strength 5/5 in all groups bilateral.  Assessment: 1.  Eccrine from a left foot   Plan of Care:  -Patient evaluated -Excisional debridement of keratoic lesion(s) using a chisel blade was performed without incident.  -Salicylic acid applied with a bandaid -Advised against going barefoot -Return to the clinic PRN.   Dot Gazella, DPM [facility]  Dr. Dot Gazella, DPM    [address] [place], [place] [address]                Office [phone]  Fax [phone]

## 2023-09-29 ENCOUNTER — Telehealth: Payer: Self-pay | Admitting: Licensed Clinical Social Worker

## 2023-09-29 NOTE — Telephone Encounter (Signed)
 provided pt w/ contact information again for [facility] to establish with a counselor

## 2023-10-07 ENCOUNTER — Emergency Department (HOSPITAL_BASED_OUTPATIENT_CLINIC_OR_DEPARTMENT_OTHER)
Admission: EM | Admit: 2023-10-07 | Discharge: 2023-10-07 | Disposition: A | Discharge status: Home / Self Care | Attending: Emergency Medicine | Admitting: Emergency Medicine

## 2023-10-07 ENCOUNTER — Emergency Department (HOSPITAL_BASED_OUTPATIENT_CLINIC_OR_DEPARTMENT_OTHER)

## 2023-10-07 ENCOUNTER — Other Ambulatory Visit: Payer: Self-pay

## 2023-10-07 ENCOUNTER — Emergency Department (HOSPITAL_BASED_OUTPATIENT_CLINIC_OR_DEPARTMENT_OTHER): Admitting: Radiology

## 2023-10-07 ENCOUNTER — Encounter (HOSPITAL_BASED_OUTPATIENT_CLINIC_OR_DEPARTMENT_OTHER): Payer: Self-pay | Admitting: Emergency Medicine

## 2023-10-07 DIAGNOSIS — W01198A Fall on same level from slipping, tripping and stumbling with subsequent striking against other object, initial encounter: Secondary | ICD-10-CM | POA: Insufficient documentation

## 2023-10-07 DIAGNOSIS — I6782 Cerebral ischemia: Secondary | ICD-10-CM | POA: Diagnosis not present

## 2023-10-07 DIAGNOSIS — E039 Hypothyroidism, unspecified: Secondary | ICD-10-CM | POA: Insufficient documentation

## 2023-10-07 DIAGNOSIS — S81812A Laceration without foreign body, left lower leg, initial encounter: Secondary | ICD-10-CM | POA: Diagnosis not present

## 2023-10-07 DIAGNOSIS — S0990XA Unspecified injury of head, initial encounter: Secondary | ICD-10-CM | POA: Insufficient documentation

## 2023-10-07 DIAGNOSIS — I10 Essential (primary) hypertension: Secondary | ICD-10-CM | POA: Insufficient documentation

## 2023-10-07 DIAGNOSIS — S8992XA Unspecified injury of left lower leg, initial encounter: Secondary | ICD-10-CM | POA: Diagnosis present

## 2023-10-07 DIAGNOSIS — S41112A Laceration without foreign body of left upper arm, initial encounter: Secondary | ICD-10-CM | POA: Insufficient documentation

## 2023-10-07 DIAGNOSIS — T148XXA Other injury of unspecified body region, initial encounter: Secondary | ICD-10-CM

## 2023-10-07 DIAGNOSIS — W19XXXA Unspecified fall, initial encounter: Secondary | ICD-10-CM

## 2023-10-07 MED ORDER — LIDOCAINE-EPINEPHRINE-TETRACAINE (LET) TOPICAL GEL
3.0000 mL | Freq: Once | TOPICAL | Status: AC
  Administered 2023-10-07: 3 mL via TOPICAL
  Filled 2023-10-07: qty 3

## 2023-10-07 MED ORDER — LIDOCAINE-EPINEPHRINE-TETRACAINE (LET) TOPICAL GEL
9.0000 mL | Freq: Once | TOPICAL | Status: AC
  Administered 2023-10-07: 9 mL via TOPICAL
  Filled 2023-10-07: qty 9

## 2023-10-07 MED ORDER — BACITRACIN ZINC 500 UNIT/GM EX OINT: 1.0000 | TOPICAL_OINTMENT | Freq: Every day | CUTANEOUS | 0 refills | Status: DC

## 2023-10-07 MED ORDER — BACITRACIN ZINC 500 UNIT/GM EX OINT
TOPICAL_OINTMENT | Freq: Once | CUTANEOUS | Status: AC
  Administered 2023-10-07: 31.5 via TOPICAL
  Filled 2023-10-07: qty 28.35

## 2023-10-07 NOTE — Discharge Instructions (Addendum)
 You have a large abrasion to your left shin and this will take time to heal.  Keep covered with antibiotic (bacitracin ) ointment and a nonadherent (tela) pad, this can be wrapped to keep in place.  Keep the wound dry for 24 hours after that you can shower I encourage you to get the dressing wet for removal and then replace the antibiotic ointment and dressing.  Keep clean and covered until scab forms and then you can place dressing as needed it is sometimes helpful if you find that you bump your shins often to provide some extra padding and protection for the scab.  You can use Tylenol  650 mg every 6 hours as needed for pain.  Monitor for surrounding redness increasing pain or swelling as these are signs of infection.

## 2023-10-07 NOTE — ED Triage Notes (Signed)
 Mechanical fall yesterday around 1600. Lac to left leg. States hit head but no LOC. No thinners. Bleeding controlled.

## 2023-10-07 NOTE — ED Notes (Signed)
 DC paperwork given and verbally understood.

## 2023-10-13 DIAGNOSIS — M170 Bilateral primary osteoarthritis of knee: Secondary | ICD-10-CM | POA: Diagnosis not present

## 2023-10-13 DIAGNOSIS — M545 Low back pain, unspecified: Secondary | ICD-10-CM | POA: Diagnosis not present

## 2023-10-13 NOTE — ED Provider Notes (Signed)
 [place] [facility] AT [facility] Provider Note   CSN: [id number] Arrival date & time: 10/07/23  1003     Patient presents with: Daisy Oliver Daisy Oliver is a 88 y.o. female.   Daisy Oliver is a 88 y.o. female with a history of hypertension, hypothyroidism, GERD, who presents to the emergency department for evaluation of laceration to the left shin after a mechanical fall yesterday.  Patient reports that she tripped and fell striking her anterior left shin and sustaining a large skin tear.  She also reports having a small laceration to the left upper arm.  She reports that she did hit her head but denies loss of consciousness, vision changes, nausea, vomiting or headache, no numbness or weakness.  She is not on any blood thinners.  Applied a dressing to the wound on her shin but reports it has been very painful which prompted her evaluation today.  Tetanus up-to-date.   Fall Pertinent negatives include no headaches.       Prior to Admission medications   Medication Sig Start Date End Date Taking? Authorizing Provider  bacitracin  ointment Apply 1 Application topically daily. 10/07/23  Yes Jaevion Goto F, PA-C  acetaminophen  (TYLENOL ) 500 MG tablet Take 500 mg by mouth 2 (two) times daily as needed (pain).    [provider]  Ascorbic Acid (VITAMIN C PO) Take 1 tablet by mouth daily.    [provider]  BIOTIN PO Take 1 tablet by mouth 2 (two) times daily.    [provider]  celecoxib (CELEBREX) 100 MG capsule Take 100 mg by mouth 2 (two) times daily as needed.    [provider]  famotidine  (PEPCID ) 20 MG tablet Take 20 mg by mouth at bedtime as needed for indigestion.    [provider]  gabapentin  (NEURONTIN ) 100 MG capsule TAKE 1 CAPSULE BY MOUTH TWICE A DAY 07/08/23   Vaslow, Zachary K, MD  hydroxychloroquine (PLAQUENIL) 200 MG tablet as directed Orally    [provider]  liothyronine  (CYTOMEL )  5 MCG tablet TAKE 5 MCG BY MOUTH IN THE MORNING AND AT BEDTIME. 01/28/23   Wilkinson, Dana E, FNP  MAGNESIUM  PO Take 1 capsule by mouth 2 (two) times daily.    [provider]  Melatonin 10 MG TABS Take 10 mg by mouth at bedtime.    [provider]  Menthol, Topical Analgesic, (BIOFREEZE EX) Apply 1 Application topically at bedtime as needed (leg pain).    [provider]  Misc Natural Products (OSTEO BI-FLEX JOINT SHIELD PO) Take 1 tablet by mouth 2 (two) times daily.    [provider]  Multiple Vitamins-Minerals (ZINC  PO) Take 1 tablet by mouth 2 (two) times daily.    [provider]  MYRBETRIQ 50 MG TB24 tablet 1 tablet Orally Once a day for 30 days 11/05/22   [provider]  olmesartan  (BENICAR ) 20 MG tablet Take  1 tablet  Daily  for BP 07/30/22   Tonita Fallow, MD  Polyethyl Glycol-Propyl Glycol (SYSTANE) 0.4-0.3 % GEL ophthalmic gel Place 1 Application into both eyes at bedtime.    [provider]  TAURINE  PO Take 1 capsule by mouth daily.    [provider]    Allergies: Barbiturates, Other, Pentothal [thiopental], and Sulfa antibiotics    Review of Systems  Constitutional:  Negative for chills and fever.  Eyes:  Negative for visual disturbance.  Gastrointestinal:  Negative for nausea and vomiting.  Skin:  Positive for wound.  Neurological:  Negative for syncope and headaches.    Updated Vital Signs BP (!) 147/78 (BP Location: Left Arm)   Pulse 78   Temp 97.8 F (36.6 C) (Oral)   Resp (!) 24   SpO2 97%   Physical Exam Vitals and nursing note reviewed.  Constitutional:      General: She is not in acute distress.    Appearance: Normal appearance. She is well-developed. She is not ill-appearing or diaphoretic.  HENT:     Head: Normocephalic and atraumatic.  Eyes:     General:        Right eye: No discharge.        Left eye: No discharge.  Pulmonary:     Effort: Pulmonary effort is normal. No  respiratory distress.  Musculoskeletal:     Cervical back: No tenderness.     Comments: Large skin tear to the anterior left shin with bleeding controlled, no underlying palpable deformity, DP and PT pulses 2+, 5/5 strength with dorsi and plantarflexion.  There is also a 1 cm skin tear to the left upper arm with bleeding controlled.  All joints supple and easily movable, compartments soft  Neurological:     General: No focal deficit present.     Mental Status: She is alert and oriented to person, place, and time. Mental status is at baseline.     Cranial Nerves: No cranial nerve deficit.     Sensory: No sensory deficit.     Motor: No weakness.     Coordination: Coordination normal.  Psychiatric:        Mood and Affect: Mood normal.        Behavior: Behavior normal.     (all labs ordered are listed, but only abnormal results are displayed) Labs Reviewed - No data to display  EKG: None  Radiology: DG Tibia/Fibula Left Result Date: 10/07/2023 CLINICAL DATA:  Left leg laceration after fall yesterday. EXAM: LEFT TIBIA AND FIBULA - 2 VIEW COMPARISON:  None Available. FINDINGS: There is no evidence of fracture or other focal bone lesions. Soft tissues are unremarkable. IMPRESSION: Negative. Electronically Signed   By: Lynwood Landy Raddle M.D.   On: 10/07/2023 11:45   CT Head Wo Contrast Result Date: 10/07/2023 CLINICAL DATA:  Trauma. EXAM: CT HEAD WITHOUT CONTRAST CT CERVICAL SPINE WITHOUT CONTRAST TECHNIQUE: Multidetector CT imaging of the head and cervical spine was performed following the standard protocol without intravenous contrast. Multiplanar CT image reconstructions of the cervical spine were also generated. RADIATION DOSE REDUCTION: This exam was performed according to the departmental dose-optimization program which includes automated exposure control, adjustment of the mA and/or kV according to patient size and/or use of iterative reconstruction technique. COMPARISON:  None Available.  FINDINGS: CT HEAD FINDINGS Brain: Right frontal encephalomalacia of consistent with old CVA versus postop changes from prior resection and compensatory dilatation of frontal horn right lateral ventricle. There is periventricular white matter decreased attenuation consistent with small vessel ischemic changes. Ventricles, sulci and cisterns are prominent consistent with age related involutional changes. No acute intracranial hemorrhage, mass effect or shift. No hydrocephalus. Vascular: No hyperdense vessel or unexpected calcification. Skull: Normal. Negative for fracture or focal lesion. Sinuses/Orbits: No acute finding. CT CERVICAL SPINE FINDINGS Alignment: Normal. 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 No acute fracture. No primary bone lesion or focal pathologic process. Soft tissues and spinal canal: No prevertebral fluid or swelling. No visible canal hematoma. Disc levels: There  is disc space narrowing with marginal osteophyte formation at each cervical level most severely involving C5-6-7. Upper chest: Negative. IMPRESSION: 1. Atrophy and chronic small vessel ischemic changes. 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 No acute intracranial process identified. 2. Degenerative changes of the cervical spine. No acute traumatic abnormalities identified. Electronically Signed   By: Fonda Field M.D.   On: 10/07/2023 11:36   CT Cervical Spine Wo Contrast Result Date: 10/07/2023 CLINICAL DATA:  Trauma. EXAM: CT HEAD WITHOUT CONTRAST CT CERVICAL SPINE WITHOUT CONTRAST TECHNIQUE: Multidetector CT imaging of the head and cervical spine was performed following the standard protocol without intravenous contrast. Multiplanar CT image reconstructions of the cervical spine were also generated. RADIATION DOSE REDUCTION: This exam was performed according to the departmental dose-optimization program which includes automated exposure control, adjustment of the mA  and/or kV according to patient size and/or use of iterative reconstruction technique. COMPARISON:  None Available. FINDINGS: CT HEAD FINDINGS Brain: Right frontal encephalomalacia of consistent with old CVA versus postop changes from prior resection and compensatory dilatation of frontal horn right lateral ventricle. There is periventricular white matter decreased attenuation consistent with small vessel ischemic changes. Ventricles, sulci and cisterns are prominent consistent with age related involutional changes. No acute intracranial hemorrhage, mass effect or shift. No hydrocephalus. Vascular: No hyperdense vessel or unexpected calcification. Skull: Normal. Negative for fracture or focal lesion. Sinuses/Orbits: No acute finding. CT CERVICAL SPINE FINDINGS Alignment: Normal. 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 No acute fracture. No primary bone lesion or focal pathologic process. Soft tissues and spinal canal: No prevertebral fluid or swelling. No visible canal hematoma. Disc levels: There is disc space narrowing with marginal osteophyte formation at each cervical level most severely involving C5-6-7. Upper chest: Negative. IMPRESSION: 1. Atrophy and chronic small vessel ischemic changes. 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 No acute intracranial process identified. 2. Degenerative changes of the cervical spine. No acute traumatic abnormalities identified. Electronically Signed   By: Fonda Field M.D.   On: 10/07/2023 11:36      .Laceration Repair  Date/Time: 10/13/2023 9:50 PM  Performed by: Alva Larraine FALCON, PA-C Authorized by: Alva Larraine FALCON, PA-C   Consent:    Consent obtained:  Verbal   Consent given by:  Patient   Risks, benefits, and alternatives were discussed: yes     Risks discussed:  Pain and infection   Alternatives discussed:  No treatment Universal protocol:    Procedure explained and questions answered to  patient or proxy's satisfaction: yes     Patient identity confirmed:  Verbally with patient Anesthesia:    Anesthesia method:  Topical application   Topical anesthetic:  LET Laceration details:    Location:  Leg   Leg location:  L lower leg   Length (cm):  13 Exploration:    Hemostasis achieved with:  LET   Wound exploration: entire depth of wound visualized   Treatment:    Area cleansed with:  Saline   Amount of cleaning:  Standard   Debridement:  Moderate (trimmed away thin lose skin around wound periphery) Post-procedure details:    Dressing:  Non-adherent dressing   Procedure completion:  Tolerated well, no immediate complications    Medications Ordered in the ED  lidocaine -EPINEPHrine -tetracaine  (LET) topical gel (9 mLs Topical Given 10/07/23 1037)  lidocaine -EPINEPHrine -tetracaine  (LET) topical gel (3 mLs Topical Given 10/07/23 1057)  bacitracin  ointment (31.5 Applications Topical Given 10/07/23 1221)  Medical Decision Making Amount and/or Complexity of Data Reviewed Radiology: ordered.  Risk OTC drugs.   88 year old female presents with large skin tear after mechanical fall, imaging without underlying bony or intracranial injury.  Tetanus up-to-date.  No tissue left that would be amenable to suture repair, wound anesthetized, cleaned and debrided of thin pieces of skin along the edge.  Nonadherent dressing applied.  Discussed wound care at home and return precautions.  Discharged home in good condition.     Final diagnoses:  Fall, initial encounter  Skin abrasion    ED Discharge Orders          Ordered    bacitracin  ointment  Daily        10/07/23 1230              Alva Larraine FALCON, [place] 10/13/23 2153    Mannie Pac T, DO 10/16/23 1502

## 2023-10-14 DIAGNOSIS — Z7689 Persons encountering health services in other specified circumstances: Secondary | ICD-10-CM | POA: Diagnosis not present

## 2023-10-14 DIAGNOSIS — Z9181 History of falling: Secondary | ICD-10-CM | POA: Diagnosis not present

## 2023-10-14 DIAGNOSIS — S81812D Laceration without foreign body, left lower leg, subsequent encounter: Secondary | ICD-10-CM | POA: Diagnosis not present

## 2023-10-20 ENCOUNTER — Ambulatory Visit (HOSPITAL_COMMUNITY)
Admission: RE | Admit: 2023-10-20 | Discharge: 2023-10-20 | Disposition: A | Discharge status: Home / Self Care | Source: Ambulatory Visit | Attending: Internal Medicine | Admitting: Internal Medicine

## 2023-10-20 DIAGNOSIS — C8339 Primary central nervous system lymphoma: Secondary | ICD-10-CM | POA: Insufficient documentation

## 2023-10-20 DIAGNOSIS — G9389 Other specified disorders of brain: Secondary | ICD-10-CM | POA: Diagnosis not present

## 2023-10-20 MED ORDER — GADOBUTROL 1 MMOL/ML IV SOLN
6.0000 mL | Freq: Once | INTRAVENOUS | Status: AC | PRN
  Administered 2023-10-20: 6 mL via INTRAVENOUS

## 2023-10-21 DIAGNOSIS — R0781 Pleurodynia: Secondary | ICD-10-CM | POA: Diagnosis not present

## 2023-10-21 DIAGNOSIS — K8689 Other specified diseases of pancreas: Secondary | ICD-10-CM | POA: Diagnosis not present

## 2023-10-21 DIAGNOSIS — G9389 Other specified disorders of brain: Secondary | ICD-10-CM | POA: Diagnosis not present

## 2023-10-21 DIAGNOSIS — S81812D Laceration without foreign body, left lower leg, subsequent encounter: Secondary | ICD-10-CM | POA: Diagnosis not present

## 2023-10-21 DIAGNOSIS — R3915 Urgency of urination: Secondary | ICD-10-CM | POA: Diagnosis not present

## 2023-10-21 DIAGNOSIS — Z8572 Personal history of non-Hodgkin lymphomas: Secondary | ICD-10-CM | POA: Diagnosis not present

## 2023-10-21 DIAGNOSIS — M5030 Other cervical disc degeneration, unspecified cervical region: Secondary | ICD-10-CM | POA: Diagnosis not present

## 2023-10-21 DIAGNOSIS — R3911 Hesitancy of micturition: Secondary | ICD-10-CM | POA: Diagnosis not present

## 2023-10-21 DIAGNOSIS — H539 Unspecified visual disturbance: Secondary | ICD-10-CM | POA: Diagnosis not present

## 2023-10-25 ENCOUNTER — Ambulatory Visit: Payer: Self-pay | Admitting: Internal Medicine

## 2023-10-26 ENCOUNTER — Inpatient Hospital Stay: Payer: Medicare PPO | Attending: Neurosurgery | Admitting: Internal Medicine

## 2023-10-26 VITALS — BP 143/68 | HR 70 | Temp 97.3°F | Resp 18 | Wt 120.2 lb

## 2023-10-26 DIAGNOSIS — Z87891 Personal history of nicotine dependence: Secondary | ICD-10-CM | POA: Insufficient documentation

## 2023-10-26 DIAGNOSIS — Z8249 Family history of ischemic heart disease and other diseases of the circulatory system: Secondary | ICD-10-CM | POA: Insufficient documentation

## 2023-10-26 DIAGNOSIS — Z808 Family history of malignant neoplasm of other organs or systems: Secondary | ICD-10-CM | POA: Diagnosis not present

## 2023-10-26 DIAGNOSIS — Z79899 Other long term (current) drug therapy: Secondary | ICD-10-CM | POA: Diagnosis not present

## 2023-10-26 DIAGNOSIS — G9389 Other specified disorders of brain: Secondary | ICD-10-CM | POA: Diagnosis not present

## 2023-10-26 DIAGNOSIS — I10 Essential (primary) hypertension: Secondary | ICD-10-CM | POA: Diagnosis not present

## 2023-10-26 DIAGNOSIS — M5030 Other cervical disc degeneration, unspecified cervical region: Secondary | ICD-10-CM | POA: Diagnosis not present

## 2023-10-26 DIAGNOSIS — Z818 Family history of other mental and behavioral disorders: Secondary | ICD-10-CM | POA: Diagnosis not present

## 2023-10-26 DIAGNOSIS — H539 Unspecified visual disturbance: Secondary | ICD-10-CM | POA: Diagnosis not present

## 2023-10-26 DIAGNOSIS — Z8261 Family history of arthritis: Secondary | ICD-10-CM | POA: Insufficient documentation

## 2023-10-26 DIAGNOSIS — Z8572 Personal history of non-Hodgkin lymphomas: Secondary | ICD-10-CM | POA: Diagnosis not present

## 2023-10-26 DIAGNOSIS — R0781 Pleurodynia: Secondary | ICD-10-CM | POA: Diagnosis not present

## 2023-10-26 DIAGNOSIS — C8339 Primary central nervous system lymphoma: Secondary | ICD-10-CM | POA: Insufficient documentation

## 2023-10-26 DIAGNOSIS — Z8269 Family history of other diseases of the musculoskeletal system and connective tissue: Secondary | ICD-10-CM | POA: Diagnosis not present

## 2023-10-26 DIAGNOSIS — Z809 Family history of malignant neoplasm, unspecified: Secondary | ICD-10-CM | POA: Diagnosis not present

## 2023-10-26 DIAGNOSIS — R3915 Urgency of urination: Secondary | ICD-10-CM | POA: Diagnosis not present

## 2023-10-26 DIAGNOSIS — K8689 Other specified diseases of pancreas: Secondary | ICD-10-CM | POA: Diagnosis not present

## 2023-10-26 DIAGNOSIS — S81812D Laceration without foreign body, left lower leg, subsequent encounter: Secondary | ICD-10-CM | POA: Diagnosis not present

## 2023-10-26 DIAGNOSIS — R3911 Hesitancy of micturition: Secondary | ICD-10-CM | POA: Diagnosis not present

## 2023-10-26 MED ORDER — METHYLPHENIDATE HCL 5 MG PO TABS: 5.0000 mg | ORAL_TABLET | Freq: Two times a day (BID) | ORAL | 0 refills | Status: DC

## 2023-10-26 NOTE — Progress Notes (Signed)
 [facility] at [facility] [address]  [place], [place] [address] [phone]   Interval Evaluation  Date of Service: 10/26/23 Patient Name: Daisy Oliver Patient MRN: [id number] Patient DOB: 1935/07/25 Provider: Arthea MARLA Manns, MD  Identifying Statement:  Daisy Oliver is a 88 y.o. female with right frontal CNS lymphoma   Oncologic History: Oncology History  Primary CNS lymphoma  09/02/2021 Surgery   Stereotactic biopsy by Dr. Debby; path is B-cell lymphoma CD20+   09/30/2021 - 12/05/2021 Chemotherapy   Patient is on Treatment Plan : IP NON-HODGKINS LYMPHOMA High Dose Methotrexate  + Leucovorin  Rescue     10/02/2021 - 01/01/2022 Chemotherapy   Patient is on Treatment Plan : NON-HODGKINS LYMPHOMA Rituximab  q21d     01/01/2022 -  Chemotherapy   Patient is on Treatment Plan : BRAIN GLIOBLASTOMA Consolidation Temozolomide  Days 1-5 q28 Days      01/29/2022 - 02/23/2022 Radiation Therapy   Whole brain radiation with right frontal boost with Dr. Izell     Biomarkers:  CD20 positive.  Ki-67 50%.   Unknown   Unknown   Interval History: Daisy Oliver presents today for follow up after recent MRI brain.  Had her port removed in January without issue.  Continues to have issues with fatigue, sleeping 12-14 hours daily. Denies any other new or progressive changes today.  Continues to experience chronic orthopedic issues. Currently independent with gait and functional status, is utilizing a cane most of the time.  Denies seizures, headaches.  H+P (09/16/21) Patient presented to medical attention in May 2023 with several weeks of progressive gait instability.  She describes feeling unsteady on her feet, even falling a couple of times.  After one of the falls, ED visit led to CNS imaging which demonstrated multifocal masses in the right hemisphere.  She underwent stereotactic biopsy with Dr. Debby on 09/02/21; path demonstrated diffuse large  B-cell lymphoma, CD20+.  Since surgery, she has tapered off the steroids without issue.  She feels fairly normal aside from pain in her knee and lower back.  She uses a cane to walk, mainly because of the knee pain.  No issues with frank weakness, no seizures.  She lives at home with her husband who has dementia.  Daughter is nearby in [place] .  Medications: Current Outpatient Medications on File Prior to Visit  Medication Sig Dispense Refill   acetaminophen  (TYLENOL ) 500 MG tablet Take 500 mg by mouth 2 (two) times daily as needed (pain).     Ascorbic Acid (VITAMIN C PO) Take 1 tablet by mouth daily.     bacitracin  ointment Apply 1 Application topically daily. 120 g 0   BIOTIN PO Take 1 tablet by mouth 2 (two) times daily.     celecoxib (CELEBREX) 100 MG capsule Take 100 mg by mouth 2 (two) times daily as needed.     famotidine  (PEPCID ) 20 MG tablet Take 20 mg by mouth at bedtime as needed for indigestion.     gabapentin  (NEURONTIN ) 100 MG capsule TAKE 1 CAPSULE BY MOUTH TWICE A DAY 180 capsule 2   hydroxychloroquine (PLAQUENIL) 200 MG tablet as directed Orally     liothyronine  (CYTOMEL ) 5 MCG tablet TAKE 5 MCG BY MOUTH IN THE MORNING AND AT BEDTIME. 180 tablet 2   MAGNESIUM  PO Take 1 capsule by mouth 2 (two) times daily.     Melatonin 10 MG TABS Take 10 mg by mouth at bedtime.     Menthol, Topical Analgesic, (BIOFREEZE EX)  Apply 1 Application topically at bedtime as needed (leg pain).     Misc Natural Products (OSTEO BI-FLEX JOINT SHIELD PO) Take 1 tablet by mouth 2 (two) times daily.     Multiple Vitamins-Minerals (ZINC  PO) Take 1 tablet by mouth 2 (two) times daily.     MYRBETRIQ 50 MG TB24 tablet 1 tablet Orally Once a day for 30 days     olmesartan  (BENICAR ) 20 MG tablet Take  1 tablet  Daily  for BP 90 tablet 3   Polyethyl Glycol-Propyl Glycol (SYSTANE) 0.4-0.3 % GEL ophthalmic gel Place 1 Application into both eyes at bedtime.     TAURINE  PO Take 1 capsule by mouth daily.     No  current facility-administered medications on file prior to visit.    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HCC)    diagnosed at age 45   Cancer [facility])    CNS lymphoma    COVID-19 08/26/2020   Fracture of radial head, right, closed 09/16/2012   GERD (gastroesophageal reflux disease)    pepcid  prn   Hypertension    Hypothyroidism    PONV (postoperative nausea and vomiting)    Past Surgical History:  Past Surgical History:  Procedure Laterality Date   APPLICATION OF CRANIAL NAVIGATION Right 09/02/2021   Procedure: APPLICATION OF CRANIAL NAVIGATION;  Surgeon: Debby Dorn MATSU, MD;  Location: [facility] OR;  Service: Neurosurgery;  Laterality: Right;   CATARACT EXTRACTION, BILATERAL  2020   Dr. Cleatus   FRAMELESS  BIOPSY WITH BRAINLAB Right 09/02/2021   Procedure: FRONTAL STEREOTACTIC BRAIN BIOPSY;  Surgeon: Debby Dorn MATSU, MD;  Location: [facility] OR;  Service: Neurosurgery;  Laterality: Right;   IR IMAGING GUIDED PORT INSERTION  09/24/2021   IR REMOVAL TUN ACCESS W/ PORT W/O FL MOD SED  05/12/2023   RADIAL HEAD ARTHROPLASTY Right 09/16/2012   Procedure: RADIAL HEAD ARTHROPLASTY;  Surgeon: Fonda SHAUNNA Olmsted, MD;  Location: [facility];  Service: Orthopedics;  Laterality: Right;  RADIAL HEAD REPLACEMENT     Social History:  Social History   Socioeconomic History   Marital status: Married    Spouse name: Not on file   Number of children: 2   Years of education: Not on file   Highest education level: Not on file  Occupational History   Not on file  Tobacco Use   Smoking status: Former    Current packs/day:  0.00    Average packs/day: 2.0 packs/day for 22.0 years (44.0 ttl pk-yrs)    Types: Cigarettes    Start date: 09/16/1961    Quit date: 09/17/1983    Years since quitting: 40.1   Smokeless tobacco: Never  Vaping Use   Vaping status: Never Used  Substance and Sexual Activity   Alcohol use: Not Currently    Alcohol/week: 0.0 - 1.0 standard drinks of alcohol    Comment: 2/month   Drug use: No   Sexual activity: Not Currently  Other Topics Concern   Not on file  Social History Narrative   Not on file   Social Drivers of  Health   Financial Resource Strain: Not on file  Food Insecurity: No Food Insecurity (11/10/2022)   Hunger Vital Sign    Worried About Running Out of Food in the Last Year: Never true    Ran Out of Food in the Last Year: Never true  Transportation Needs: No Transportation Needs (11/10/2022)   PRAPARE - Administrator, Civil Service (Medical): No    Lack of Transportation (Non-Medical): No  Physical Activity: Sufficiently Active (09/14/2017)   Exercise Vital Sign    Days of Exercise per Week: 5 days    Minutes of Exercise per Session: 60 min  Stress: No Stress Concern Present (09/14/2017)   [employer] of Occupational Health - Occupational Stress Questionnaire    Feeling of Stress : Only a little  Social Connections: Not on file  Intimate Partner Violence: Not At Risk (11/10/2022)   Humiliation, Afraid, Rape, and Kick questionnaire    Fear of Current or Ex-Partner: No    Emotionally Abused: No    Physically Abused: No    Sexually Abused: No   Family History:  Family History  Problem Relation Age of Onset   Heart disease Mother    Brain cancer Father 83       Brain tumor, not cancerous   Hypertension Sister    Osteoporosis Sister    Arthritis Sister    Scoliosis Sister    Hypertension Sister    Osteoporosis Sister    Cancer Paternal Grandfather    Suicidality Paternal Grandfather    Breast cancer Neg Hx     Review of  Systems: Constitutional: Doesn't report fevers, chills or abnormal weight loss Eyes: Doesn't report blurriness of vision Ears, nose, mouth, throat, and face: Doesn't report sore throat Respiratory: Doesn't report cough, dyspnea or wheezes Cardiovascular: Doesn't report palpitation, chest discomfort  Gastrointestinal:  Doesn't report nausea, constipation, diarrhea GU: Doesn't report incontinence Skin: Doesn't report skin rashes Neurological: Per HPI Musculoskeletal: Doesn't report joint pain Behavioral/Psych: Doesn't report anxiety  Physical Exam: Vitals:   10/26/23 1019  BP: (!) 143/68  Pulse: 70  Resp: 18  Temp: (!) 97.3 F (36.3 C)  SpO2: 97%     KPS: 80. General: Alert, cooperative, pleasant, in no acute distress Head: Normal EENT: No conjunctival injection or scleral icterus.  Lungs: Resp effort normal Cardiac: Regular rate Abdomen: Non-distended abdomen Skin: No rashes cyanosis or petechiae. Extremities: No clubbing or edema  Neurologic Exam: Mental Status: Awake, alert, attentive to examiner. Oriented to self and environment. Language is fluent with intact comprehension.  Cranial Nerves: Visual acuity is grossly normal. Visual fields are full. Extra-ocular movements intact. No ptosis. Face is symmetric Motor: Tone and bulk are normal. Power is full in both arms and legs, with subtle drift noted in left arm. Reflexes are symmetric, no pathologic reflexes present.  Sensory: Intact to light touch Gait: Orthopedic limitation, cane assisted   Labs: I have reviewed the data as listed    Component Value Date/Time   NA 145 07/30/2022 1125   K 4.3 07/30/2022 1125   CL 104 07/30/2022 1125   CO2 32 07/30/2022 1125   GLUCOSE 85 07/30/2022 1125   BUN 18 07/30/2022 1125   CREATININE 0.79 07/30/2022 1125   CALCIUM  9.9 07/30/2022 1125   PROT 6.7 07/30/2022 1125   ALBUMIN 3.7 01/26/2022 1100   AST 18 07/30/2022 1125   AST 23 01/26/2022 1100   ALT 11 07/30/2022 1125    ALT 23 01/26/2022 1100   ALKPHOS  46 01/26/2022 1100   BILITOT 0.8 07/30/2022 1125   BILITOT 0.6 01/26/2022 1100   GFRNONAA >60 01/26/2022 1100   GFRNONAA 66 09/19/2020 0000   GFRAA 76 09/19/2020 0000   Lab Results  Component Value Date   WBC 8.8 07/30/2022   NEUTROABS 6,662 07/30/2022   HGB 12.6 07/30/2022   HCT 38.0 07/30/2022   MCV 95.0 07/30/2022   PLT 204 07/30/2022   Imaging:  [facility] Clinician Interpretation: I have personally reviewed the CNS images as listed.  My interpretation, in the context of the patient's clinical presentation, is stable disease  MR BRAIN W WO CONTRAST Result Date: 10/20/2023 CLINICAL DATA:  Brain/CNS neoplasm, assess treatment response EXAM: MRI HEAD WITHOUT AND WITH CONTRAST TECHNIQUE: Multiplanar, multiecho pulse sequences of the brain and surrounding structures were obtained without and with intravenous contrast. CONTRAST:  6mL GADAVIST  GADOBUTROL  1 MMOL/ML IV SOLN COMPARISON:  CT of the head dated October 07, 2023 and MRI of the head dated Aug 19, 2023. FINDINGS: Brain: Stable chronic encephalomalacia changes within the right frontal lobe, basal ganglia and insula with ex vacuo dilatation of the anterior horn of the right lateral ventricle. A stable nodular area of enhancement is again demonstrated anteromedially within the right upper lobe and appears unchanged. There are no additional areas of abnormal enhancement. There is increased T2 signal hyperintensity also seen along the anterior horn of the left lateral ventricle. There is a no significant interval change in the appearance of the brain. There is no restricted diffusion. There is mild hemosiderin staining present within the right basal ganglia and anteriorly within the right frontal lobe. Vascular: Normal flow voids. Skull and upper cervical spine: Status post right frontal craniotomy. Normal marrow signal. Sinuses/Orbits: Clear paranasal sinuses. Patient is status post bilateral lens replacement. Other:  None. IMPRESSION: 1. Stable encephalomalacia changes within the right cerebral hemisphere with ex vacuo dilatation of the anterior horn of the right lateral ventricle and periventricular gliosis, will likely treatment related. There is a small focal area of residual enhancement present anteromedially within the right frontal lobe, which is stable and likely represents scarring. Electronically Signed   By: Evalene Coho M.D.   On: 10/20/2023 15:19   DG Tibia/Fibula Left Result Date: 10/07/2023 CLINICAL DATA:  Left leg laceration after fall yesterday. EXAM: LEFT TIBIA AND FIBULA - 2 VIEW COMPARISON:  None Available. FINDINGS: There is no evidence of fracture or other focal bone lesions. Soft tissues are unremarkable. IMPRESSION: Negative. Electronically Signed   By: Lynwood Landy Raddle M.D.   On: 10/07/2023 11:45   CT Head Wo Contrast Result Date: 10/07/2023 CLINICAL DATA:  Trauma. EXAM: CT HEAD WITHOUT CONTRAST CT CERVICAL SPINE WITHOUT CONTRAST TECHNIQUE: Multidetector CT imaging of the head and cervical spine was performed following the standard protocol without intravenous contrast. Multiplanar CT image reconstructions of the cervical spine were also generated. RADIATION DOSE REDUCTION: This exam was performed according to the departmental dose-optimization program which includes automated exposure control, adjustment of the mA and/or kV according to patient size and/or use of iterative reconstruction technique. COMPARISON:  None Available. FINDINGS: CT HEAD FINDINGS Brain: Right frontal encephalomalacia of consistent with old CVA versus postop changes from prior resection and compensatory dilatation of frontal horn right lateral ventricle. There is periventricular white matter decreased attenuation consistent with small vessel ischemic changes. Ventricles, sulci and cisterns are prominent consistent with age related involutional changes. No acute intracranial hemorrhage, mass effect or shift. No  hydrocephalus. Vascular: No hyperdense vessel or unexpected calcification.  Skull: Normal. Negative for fracture or focal lesion. Sinuses/Orbits: No acute finding. CT CERVICAL SPINE FINDINGS Alignment: Normal. 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 No acute fracture. No primary bone lesion or focal pathologic process. Soft tissues and spinal canal: No prevertebral fluid or swelling. No visible canal hematoma. Disc levels: There is disc space narrowing with marginal osteophyte formation at each cervical level most severely involving C5-6-7. Upper chest: Negative. IMPRESSION: 1. Atrophy and chronic small vessel ischemic changes. 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 No acute intracranial process identified. 2. Degenerative changes of the cervical spine. No acute traumatic abnormalities identified. Electronically Signed   By: Fonda Field M.D.   On: 10/07/2023 11:36   CT Cervical Spine Wo Contrast Result Date: 10/07/2023 CLINICAL DATA:  Trauma. EXAM: CT HEAD WITHOUT CONTRAST CT CERVICAL SPINE WITHOUT CONTRAST TECHNIQUE: Multidetector CT imaging of the head and cervical spine was performed following the standard protocol without intravenous contrast. Multiplanar CT image reconstructions of the cervical spine were also generated. RADIATION DOSE REDUCTION: This exam was performed according to the departmental dose-optimization program which includes automated exposure control, adjustment of the mA and/or kV according to patient size and/or use of iterative reconstruction technique. COMPARISON:  None Available. FINDINGS: CT HEAD FINDINGS Brain: Right frontal encephalomalacia of consistent with old CVA versus postop changes from prior resection and compensatory dilatation of frontal horn right lateral ventricle. There is periventricular white matter decreased attenuation consistent with small vessel ischemic changes. Ventricles, sulci and cisterns are  prominent consistent with age related involutional changes. No acute intracranial hemorrhage, mass effect or shift. No hydrocephalus. Vascular: No hyperdense vessel or unexpected calcification. Skull: Normal. Negative for fracture or focal lesion. Sinuses/Orbits: No acute finding. CT CERVICAL SPINE FINDINGS Alignment: Normal. 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 No acute fracture. No primary bone lesion or focal pathologic process. Soft tissues and spinal canal: No prevertebral fluid or swelling. No visible canal hematoma. Disc levels: There is disc space narrowing with marginal osteophyte formation at each cervical level most severely involving C5-6-7. Upper chest: Negative. IMPRESSION: 1. Atrophy and chronic small vessel ischemic changes. 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 No acute intracranial process identified. 2. Degenerative changes of the cervical spine. No acute traumatic abnormalities identified. Electronically Signed   By: Fonda Field M.D.   On: 10/07/2023 11:36     Assessment/Plan Primary CNS lymphoma  Daisy Oliver is clinically stable today.  MRI brain was reviewed in detail at bedside; it again demonstrates stable findings.    Discussed and recommended trial of Ritalin  5mg  BID for brain tumor related fatigue, lethargy.  She understands this could affect her appetite and mood.    Will give her a call in 2 months to assess response to stimulant.  We ask that Daisy Oliver return to clinic in 6 months following next brain MRI, or sooner as needed.  All questions were answered. The patient knows to call the clinic with any problems, questions or concerns. No barriers to learning were detected.  The total time spent in the encounter was 40 minutes and more than 50% was on counseling and review of test results   Arthea MARLA Manns, MD Medical Director of Neuro-Oncology [facility] at [place]  [place] 10/26/23 10:16 AM

## 2023-10-27 DIAGNOSIS — K8689 Other specified diseases of pancreas: Secondary | ICD-10-CM | POA: Diagnosis not present

## 2023-10-27 DIAGNOSIS — R3911 Hesitancy of micturition: Secondary | ICD-10-CM | POA: Diagnosis not present

## 2023-10-27 DIAGNOSIS — R0781 Pleurodynia: Secondary | ICD-10-CM | POA: Diagnosis not present

## 2023-10-27 DIAGNOSIS — S81812D Laceration without foreign body, left lower leg, subsequent encounter: Secondary | ICD-10-CM | POA: Diagnosis not present

## 2023-10-27 DIAGNOSIS — H539 Unspecified visual disturbance: Secondary | ICD-10-CM | POA: Diagnosis not present

## 2023-10-27 DIAGNOSIS — Z8572 Personal history of non-Hodgkin lymphomas: Secondary | ICD-10-CM | POA: Diagnosis not present

## 2023-10-27 DIAGNOSIS — M5030 Other cervical disc degeneration, unspecified cervical region: Secondary | ICD-10-CM | POA: Diagnosis not present

## 2023-10-27 DIAGNOSIS — R3915 Urgency of urination: Secondary | ICD-10-CM | POA: Diagnosis not present

## 2023-10-27 DIAGNOSIS — G9389 Other specified disorders of brain: Secondary | ICD-10-CM | POA: Diagnosis not present

## 2023-10-28 DIAGNOSIS — M545 Low back pain, unspecified: Secondary | ICD-10-CM | POA: Diagnosis not present

## 2023-10-28 DIAGNOSIS — H348192 Central retinal vein occlusion, unspecified eye, stable: Secondary | ICD-10-CM | POA: Diagnosis not present

## 2023-10-28 DIAGNOSIS — G319 Degenerative disease of nervous system, unspecified: Secondary | ICD-10-CM | POA: Diagnosis not present

## 2023-10-28 DIAGNOSIS — M810 Age-related osteoporosis without current pathological fracture: Secondary | ICD-10-CM | POA: Diagnosis not present

## 2023-10-28 DIAGNOSIS — G629 Polyneuropathy, unspecified: Secondary | ICD-10-CM | POA: Diagnosis not present

## 2023-10-28 DIAGNOSIS — M1990 Unspecified osteoarthritis, unspecified site: Secondary | ICD-10-CM | POA: Diagnosis not present

## 2023-10-28 DIAGNOSIS — D84822 Immunodeficiency due to external causes: Secondary | ICD-10-CM | POA: Diagnosis not present

## 2023-10-28 DIAGNOSIS — F321 Major depressive disorder, single episode, moderate: Secondary | ICD-10-CM | POA: Diagnosis not present

## 2023-10-28 DIAGNOSIS — Z8249 Family history of ischemic heart disease and other diseases of the circulatory system: Secondary | ICD-10-CM | POA: Diagnosis not present

## 2023-10-29 DIAGNOSIS — H539 Unspecified visual disturbance: Secondary | ICD-10-CM | POA: Diagnosis not present

## 2023-10-29 DIAGNOSIS — S81812D Laceration without foreign body, left lower leg, subsequent encounter: Secondary | ICD-10-CM | POA: Diagnosis not present

## 2023-10-29 DIAGNOSIS — R3911 Hesitancy of micturition: Secondary | ICD-10-CM | POA: Diagnosis not present

## 2023-10-29 DIAGNOSIS — G9389 Other specified disorders of brain: Secondary | ICD-10-CM | POA: Diagnosis not present

## 2023-10-29 DIAGNOSIS — K8689 Other specified diseases of pancreas: Secondary | ICD-10-CM | POA: Diagnosis not present

## 2023-10-29 DIAGNOSIS — R0781 Pleurodynia: Secondary | ICD-10-CM | POA: Diagnosis not present

## 2023-10-29 DIAGNOSIS — R3915 Urgency of urination: Secondary | ICD-10-CM | POA: Diagnosis not present

## 2023-10-29 DIAGNOSIS — M5030 Other cervical disc degeneration, unspecified cervical region: Secondary | ICD-10-CM | POA: Diagnosis not present

## 2023-10-29 DIAGNOSIS — Z8572 Personal history of non-Hodgkin lymphomas: Secondary | ICD-10-CM | POA: Diagnosis not present

## 2023-11-01 DIAGNOSIS — S81812D Laceration without foreign body, left lower leg, subsequent encounter: Secondary | ICD-10-CM | POA: Diagnosis not present

## 2023-11-01 DIAGNOSIS — R3915 Urgency of urination: Secondary | ICD-10-CM | POA: Diagnosis not present

## 2023-11-01 DIAGNOSIS — M5030 Other cervical disc degeneration, unspecified cervical region: Secondary | ICD-10-CM | POA: Diagnosis not present

## 2023-11-01 DIAGNOSIS — G9389 Other specified disorders of brain: Secondary | ICD-10-CM | POA: Diagnosis not present

## 2023-11-01 DIAGNOSIS — H539 Unspecified visual disturbance: Secondary | ICD-10-CM | POA: Diagnosis not present

## 2023-11-01 DIAGNOSIS — K8689 Other specified diseases of pancreas: Secondary | ICD-10-CM | POA: Diagnosis not present

## 2023-11-01 DIAGNOSIS — Z8572 Personal history of non-Hodgkin lymphomas: Secondary | ICD-10-CM | POA: Diagnosis not present

## 2023-11-01 DIAGNOSIS — R3911 Hesitancy of micturition: Secondary | ICD-10-CM | POA: Diagnosis not present

## 2023-11-01 DIAGNOSIS — R0781 Pleurodynia: Secondary | ICD-10-CM | POA: Diagnosis not present

## 2023-11-02 ENCOUNTER — Inpatient Hospital Stay: Admitting: Licensed Clinical Social Worker

## 2023-11-02 DIAGNOSIS — S81812D Laceration without foreign body, left lower leg, subsequent encounter: Secondary | ICD-10-CM | POA: Diagnosis not present

## 2023-11-02 DIAGNOSIS — G9389 Other specified disorders of brain: Secondary | ICD-10-CM | POA: Diagnosis not present

## 2023-11-02 DIAGNOSIS — H539 Unspecified visual disturbance: Secondary | ICD-10-CM | POA: Diagnosis not present

## 2023-11-02 DIAGNOSIS — Z8572 Personal history of non-Hodgkin lymphomas: Secondary | ICD-10-CM | POA: Diagnosis not present

## 2023-11-02 DIAGNOSIS — R0781 Pleurodynia: Secondary | ICD-10-CM | POA: Diagnosis not present

## 2023-11-02 DIAGNOSIS — R3915 Urgency of urination: Secondary | ICD-10-CM | POA: Diagnosis not present

## 2023-11-02 DIAGNOSIS — K8689 Other specified diseases of pancreas: Secondary | ICD-10-CM | POA: Diagnosis not present

## 2023-11-02 DIAGNOSIS — M5030 Other cervical disc degeneration, unspecified cervical region: Secondary | ICD-10-CM | POA: Diagnosis not present

## 2023-11-02 DIAGNOSIS — C8339 Primary central nervous system lymphoma: Secondary | ICD-10-CM

## 2023-11-02 DIAGNOSIS — R3911 Hesitancy of micturition: Secondary | ICD-10-CM | POA: Diagnosis not present

## 2023-11-02 NOTE — Progress Notes (Signed)
 [facility] CSW Progress Note  Clinical Child psychotherapist contacted patient by phone to follow-up on contact information for a Archivist.    Interventions: CSW spoke w/ pt by phone to provide her with the telephone number to [facility].  [phone])  Pt states she will contact them to set up an appointment.      Follow Up Plan:  Patient will contact CSW with any support or resource needs    Devere JONELLE Manna, LCSW Clinical Social Worker [facility]

## 2023-11-05 DIAGNOSIS — Z8572 Personal history of non-Hodgkin lymphomas: Secondary | ICD-10-CM | POA: Diagnosis not present

## 2023-11-05 DIAGNOSIS — M5030 Other cervical disc degeneration, unspecified cervical region: Secondary | ICD-10-CM | POA: Diagnosis not present

## 2023-11-05 DIAGNOSIS — G9389 Other specified disorders of brain: Secondary | ICD-10-CM | POA: Diagnosis not present

## 2023-11-05 DIAGNOSIS — S81812D Laceration without foreign body, left lower leg, subsequent encounter: Secondary | ICD-10-CM | POA: Diagnosis not present

## 2023-11-05 DIAGNOSIS — K8689 Other specified diseases of pancreas: Secondary | ICD-10-CM | POA: Diagnosis not present

## 2023-11-05 DIAGNOSIS — R3915 Urgency of urination: Secondary | ICD-10-CM | POA: Diagnosis not present

## 2023-11-05 DIAGNOSIS — R0781 Pleurodynia: Secondary | ICD-10-CM | POA: Diagnosis not present

## 2023-11-05 DIAGNOSIS — R3911 Hesitancy of micturition: Secondary | ICD-10-CM | POA: Diagnosis not present

## 2023-11-05 DIAGNOSIS — H539 Unspecified visual disturbance: Secondary | ICD-10-CM | POA: Diagnosis not present

## 2023-11-08 DIAGNOSIS — Z8572 Personal history of non-Hodgkin lymphomas: Secondary | ICD-10-CM | POA: Diagnosis not present

## 2023-11-08 DIAGNOSIS — R0781 Pleurodynia: Secondary | ICD-10-CM | POA: Diagnosis not present

## 2023-11-08 DIAGNOSIS — K8689 Other specified diseases of pancreas: Secondary | ICD-10-CM | POA: Diagnosis not present

## 2023-11-08 DIAGNOSIS — M5030 Other cervical disc degeneration, unspecified cervical region: Secondary | ICD-10-CM | POA: Diagnosis not present

## 2023-11-08 DIAGNOSIS — Z0000 Encounter for general adult medical examination without abnormal findings: Secondary | ICD-10-CM | POA: Diagnosis not present

## 2023-11-08 DIAGNOSIS — S81812D Laceration without foreign body, left lower leg, subsequent encounter: Secondary | ICD-10-CM | POA: Diagnosis not present

## 2023-11-08 DIAGNOSIS — R3911 Hesitancy of micturition: Secondary | ICD-10-CM | POA: Diagnosis not present

## 2023-11-08 DIAGNOSIS — G9389 Other specified disorders of brain: Secondary | ICD-10-CM | POA: Diagnosis not present

## 2023-11-08 DIAGNOSIS — H539 Unspecified visual disturbance: Secondary | ICD-10-CM | POA: Diagnosis not present

## 2023-11-08 DIAGNOSIS — R3915 Urgency of urination: Secondary | ICD-10-CM | POA: Diagnosis not present

## 2023-11-08 DIAGNOSIS — E0590 Thyrotoxicosis, unspecified without thyrotoxic crisis or storm: Secondary | ICD-10-CM | POA: Diagnosis not present

## 2023-11-09 DIAGNOSIS — H539 Unspecified visual disturbance: Secondary | ICD-10-CM | POA: Diagnosis not present

## 2023-11-09 DIAGNOSIS — K8689 Other specified diseases of pancreas: Secondary | ICD-10-CM | POA: Diagnosis not present

## 2023-11-09 DIAGNOSIS — R3911 Hesitancy of micturition: Secondary | ICD-10-CM | POA: Diagnosis not present

## 2023-11-09 DIAGNOSIS — R0781 Pleurodynia: Secondary | ICD-10-CM | POA: Diagnosis not present

## 2023-11-09 DIAGNOSIS — Z8572 Personal history of non-Hodgkin lymphomas: Secondary | ICD-10-CM | POA: Diagnosis not present

## 2023-11-09 DIAGNOSIS — M5030 Other cervical disc degeneration, unspecified cervical region: Secondary | ICD-10-CM | POA: Diagnosis not present

## 2023-11-09 DIAGNOSIS — G9389 Other specified disorders of brain: Secondary | ICD-10-CM | POA: Diagnosis not present

## 2023-11-09 DIAGNOSIS — S81812D Laceration without foreign body, left lower leg, subsequent encounter: Secondary | ICD-10-CM | POA: Diagnosis not present

## 2023-11-09 DIAGNOSIS — R3915 Urgency of urination: Secondary | ICD-10-CM | POA: Diagnosis not present

## 2023-11-10 DIAGNOSIS — M545 Low back pain, unspecified: Secondary | ICD-10-CM | POA: Diagnosis not present

## 2023-11-10 DIAGNOSIS — M25551 Pain in right hip: Secondary | ICD-10-CM | POA: Diagnosis not present

## 2023-11-11 DIAGNOSIS — G9389 Other specified disorders of brain: Secondary | ICD-10-CM | POA: Diagnosis not present

## 2023-11-11 DIAGNOSIS — R3911 Hesitancy of micturition: Secondary | ICD-10-CM | POA: Diagnosis not present

## 2023-11-11 DIAGNOSIS — K8689 Other specified diseases of pancreas: Secondary | ICD-10-CM | POA: Diagnosis not present

## 2023-11-11 DIAGNOSIS — C8589 Other specified types of non-Hodgkin lymphoma, extranodal and solid organ sites: Secondary | ICD-10-CM | POA: Diagnosis not present

## 2023-11-11 DIAGNOSIS — R3915 Urgency of urination: Secondary | ICD-10-CM | POA: Diagnosis not present

## 2023-11-11 DIAGNOSIS — N3946 Mixed incontinence: Secondary | ICD-10-CM | POA: Diagnosis not present

## 2023-11-11 DIAGNOSIS — N393 Stress incontinence (female) (male): Secondary | ICD-10-CM | POA: Diagnosis not present

## 2023-11-11 DIAGNOSIS — G629 Polyneuropathy, unspecified: Secondary | ICD-10-CM | POA: Diagnosis not present

## 2023-11-11 DIAGNOSIS — S81812D Laceration without foreign body, left lower leg, subsequent encounter: Secondary | ICD-10-CM | POA: Diagnosis not present

## 2023-11-11 DIAGNOSIS — Z8572 Personal history of non-Hodgkin lymphomas: Secondary | ICD-10-CM | POA: Diagnosis not present

## 2023-11-11 DIAGNOSIS — H539 Unspecified visual disturbance: Secondary | ICD-10-CM | POA: Diagnosis not present

## 2023-11-11 DIAGNOSIS — E039 Hypothyroidism, unspecified: Secondary | ICD-10-CM | POA: Diagnosis not present

## 2023-11-11 DIAGNOSIS — M5030 Other cervical disc degeneration, unspecified cervical region: Secondary | ICD-10-CM | POA: Diagnosis not present

## 2023-11-11 DIAGNOSIS — Z0000 Encounter for general adult medical examination without abnormal findings: Secondary | ICD-10-CM | POA: Diagnosis not present

## 2023-11-11 DIAGNOSIS — F331 Major depressive disorder, recurrent, moderate: Secondary | ICD-10-CM | POA: Diagnosis not present

## 2023-11-11 DIAGNOSIS — R0781 Pleurodynia: Secondary | ICD-10-CM | POA: Diagnosis not present

## 2023-11-11 DIAGNOSIS — L97921 Non-pressure chronic ulcer of unspecified part of left lower leg limited to breakdown of skin: Secondary | ICD-10-CM | POA: Diagnosis not present

## 2023-11-16 DIAGNOSIS — N3944 Nocturnal enuresis: Secondary | ICD-10-CM | POA: Diagnosis not present

## 2023-11-16 DIAGNOSIS — R351 Nocturia: Secondary | ICD-10-CM | POA: Diagnosis not present

## 2023-11-16 DIAGNOSIS — R350 Frequency of micturition: Secondary | ICD-10-CM | POA: Diagnosis not present

## 2023-11-17 DIAGNOSIS — R0781 Pleurodynia: Secondary | ICD-10-CM | POA: Diagnosis not present

## 2023-11-17 DIAGNOSIS — S81812D Laceration without foreign body, left lower leg, subsequent encounter: Secondary | ICD-10-CM | POA: Diagnosis not present

## 2023-11-17 DIAGNOSIS — R3915 Urgency of urination: Secondary | ICD-10-CM | POA: Diagnosis not present

## 2023-11-17 DIAGNOSIS — R3911 Hesitancy of micturition: Secondary | ICD-10-CM | POA: Diagnosis not present

## 2023-11-17 DIAGNOSIS — H539 Unspecified visual disturbance: Secondary | ICD-10-CM | POA: Diagnosis not present

## 2023-11-17 DIAGNOSIS — G9389 Other specified disorders of brain: Secondary | ICD-10-CM | POA: Diagnosis not present

## 2023-11-17 DIAGNOSIS — Z8572 Personal history of non-Hodgkin lymphomas: Secondary | ICD-10-CM | POA: Diagnosis not present

## 2023-11-17 DIAGNOSIS — K8689 Other specified diseases of pancreas: Secondary | ICD-10-CM | POA: Diagnosis not present

## 2023-11-17 DIAGNOSIS — M5030 Other cervical disc degeneration, unspecified cervical region: Secondary | ICD-10-CM | POA: Diagnosis not present

## 2023-11-23 DIAGNOSIS — K8689 Other specified diseases of pancreas: Secondary | ICD-10-CM | POA: Diagnosis not present

## 2023-11-23 DIAGNOSIS — R3915 Urgency of urination: Secondary | ICD-10-CM | POA: Diagnosis not present

## 2023-11-23 DIAGNOSIS — R3911 Hesitancy of micturition: Secondary | ICD-10-CM | POA: Diagnosis not present

## 2023-11-23 DIAGNOSIS — Z8572 Personal history of non-Hodgkin lymphomas: Secondary | ICD-10-CM | POA: Diagnosis not present

## 2023-11-23 DIAGNOSIS — M5030 Other cervical disc degeneration, unspecified cervical region: Secondary | ICD-10-CM | POA: Diagnosis not present

## 2023-11-23 DIAGNOSIS — S81812D Laceration without foreign body, left lower leg, subsequent encounter: Secondary | ICD-10-CM | POA: Diagnosis not present

## 2023-11-23 DIAGNOSIS — R0781 Pleurodynia: Secondary | ICD-10-CM | POA: Diagnosis not present

## 2023-11-23 DIAGNOSIS — H539 Unspecified visual disturbance: Secondary | ICD-10-CM | POA: Diagnosis not present

## 2023-11-23 DIAGNOSIS — G9389 Other specified disorders of brain: Secondary | ICD-10-CM | POA: Diagnosis not present

## 2023-11-25 DIAGNOSIS — Z8572 Personal history of non-Hodgkin lymphomas: Secondary | ICD-10-CM | POA: Diagnosis not present

## 2023-11-25 DIAGNOSIS — K8689 Other specified diseases of pancreas: Secondary | ICD-10-CM | POA: Diagnosis not present

## 2023-11-25 DIAGNOSIS — R0781 Pleurodynia: Secondary | ICD-10-CM | POA: Diagnosis not present

## 2023-11-25 DIAGNOSIS — G9389 Other specified disorders of brain: Secondary | ICD-10-CM | POA: Diagnosis not present

## 2023-11-25 DIAGNOSIS — M5030 Other cervical disc degeneration, unspecified cervical region: Secondary | ICD-10-CM | POA: Diagnosis not present

## 2023-11-25 DIAGNOSIS — H539 Unspecified visual disturbance: Secondary | ICD-10-CM | POA: Diagnosis not present

## 2023-11-25 DIAGNOSIS — R3915 Urgency of urination: Secondary | ICD-10-CM | POA: Diagnosis not present

## 2023-11-25 DIAGNOSIS — S81812D Laceration without foreign body, left lower leg, subsequent encounter: Secondary | ICD-10-CM | POA: Diagnosis not present

## 2023-11-25 DIAGNOSIS — R3911 Hesitancy of micturition: Secondary | ICD-10-CM | POA: Diagnosis not present

## 2023-11-29 DIAGNOSIS — R3915 Urgency of urination: Secondary | ICD-10-CM | POA: Diagnosis not present

## 2023-11-29 DIAGNOSIS — G9389 Other specified disorders of brain: Secondary | ICD-10-CM | POA: Diagnosis not present

## 2023-11-29 DIAGNOSIS — R3911 Hesitancy of micturition: Secondary | ICD-10-CM | POA: Diagnosis not present

## 2023-11-29 DIAGNOSIS — R0781 Pleurodynia: Secondary | ICD-10-CM | POA: Diagnosis not present

## 2023-11-29 DIAGNOSIS — H539 Unspecified visual disturbance: Secondary | ICD-10-CM | POA: Diagnosis not present

## 2023-11-29 DIAGNOSIS — S81812D Laceration without foreign body, left lower leg, subsequent encounter: Secondary | ICD-10-CM | POA: Diagnosis not present

## 2023-11-29 DIAGNOSIS — M5030 Other cervical disc degeneration, unspecified cervical region: Secondary | ICD-10-CM | POA: Diagnosis not present

## 2023-11-29 DIAGNOSIS — K8689 Other specified diseases of pancreas: Secondary | ICD-10-CM | POA: Diagnosis not present

## 2023-11-29 DIAGNOSIS — Z8572 Personal history of non-Hodgkin lymphomas: Secondary | ICD-10-CM | POA: Diagnosis not present

## 2023-11-30 DIAGNOSIS — H539 Unspecified visual disturbance: Secondary | ICD-10-CM | POA: Diagnosis not present

## 2023-11-30 DIAGNOSIS — K8689 Other specified diseases of pancreas: Secondary | ICD-10-CM | POA: Diagnosis not present

## 2023-11-30 DIAGNOSIS — G9389 Other specified disorders of brain: Secondary | ICD-10-CM | POA: Diagnosis not present

## 2023-11-30 DIAGNOSIS — R3911 Hesitancy of micturition: Secondary | ICD-10-CM | POA: Diagnosis not present

## 2023-11-30 DIAGNOSIS — R0781 Pleurodynia: Secondary | ICD-10-CM | POA: Diagnosis not present

## 2023-11-30 DIAGNOSIS — Z8572 Personal history of non-Hodgkin lymphomas: Secondary | ICD-10-CM | POA: Diagnosis not present

## 2023-11-30 DIAGNOSIS — S81812D Laceration without foreign body, left lower leg, subsequent encounter: Secondary | ICD-10-CM | POA: Diagnosis not present

## 2023-11-30 DIAGNOSIS — M5030 Other cervical disc degeneration, unspecified cervical region: Secondary | ICD-10-CM | POA: Diagnosis not present

## 2023-11-30 DIAGNOSIS — R3915 Urgency of urination: Secondary | ICD-10-CM | POA: Diagnosis not present

## 2023-12-01 ENCOUNTER — Emergency Department (HOSPITAL_COMMUNITY)
Admission: EM | Admit: 2023-12-01 | Discharge: 2023-12-01 | Discharge status: Against Medical Advice | Attending: Emergency Medicine | Admitting: Emergency Medicine

## 2023-12-01 ENCOUNTER — Other Ambulatory Visit: Payer: Self-pay

## 2023-12-01 ENCOUNTER — Encounter (HOSPITAL_COMMUNITY): Payer: Self-pay

## 2023-12-01 DIAGNOSIS — W19XXXA Unspecified fall, initial encounter: Secondary | ICD-10-CM | POA: Diagnosis not present

## 2023-12-01 DIAGNOSIS — M79652 Pain in left thigh: Secondary | ICD-10-CM | POA: Insufficient documentation

## 2023-12-01 DIAGNOSIS — Y92511 Restaurant or cafe as the place of occurrence of the external cause: Secondary | ICD-10-CM | POA: Insufficient documentation

## 2023-12-01 DIAGNOSIS — S0990XA Unspecified injury of head, initial encounter: Secondary | ICD-10-CM | POA: Diagnosis not present

## 2023-12-01 DIAGNOSIS — Z5321 Procedure and treatment not carried out due to patient leaving prior to being seen by health care provider: Secondary | ICD-10-CM | POA: Diagnosis not present

## 2023-12-01 DIAGNOSIS — W0110XA Fall on same level from slipping, tripping and stumbling with subsequent striking against unspecified object, initial encounter: Secondary | ICD-10-CM | POA: Diagnosis not present

## 2023-12-01 NOTE — ED Notes (Signed)
 Pt wants to leave, says she will see primary in the A.M.

## 2023-12-01 NOTE — ED Triage Notes (Addendum)
 Mechanical fall at restaurant. No loc, pt states she fell and then her head fell back and she hit the back of her head, no blood thinners. Pain to left inner thigh. A&Ox4 on arrival.

## 2023-12-02 ENCOUNTER — Encounter (HOSPITAL_BASED_OUTPATIENT_CLINIC_OR_DEPARTMENT_OTHER): Payer: Self-pay | Admitting: Emergency Medicine

## 2023-12-02 ENCOUNTER — Emergency Department (HOSPITAL_BASED_OUTPATIENT_CLINIC_OR_DEPARTMENT_OTHER)

## 2023-12-02 ENCOUNTER — Other Ambulatory Visit: Payer: Self-pay

## 2023-12-02 ENCOUNTER — Emergency Department (HOSPITAL_BASED_OUTPATIENT_CLINIC_OR_DEPARTMENT_OTHER)
Admission: EM | Admit: 2023-12-02 | Discharge: 2023-12-02 | Disposition: A | Discharge status: Home / Self Care | Attending: Emergency Medicine | Admitting: Emergency Medicine

## 2023-12-02 DIAGNOSIS — E039 Hypothyroidism, unspecified: Secondary | ICD-10-CM | POA: Insufficient documentation

## 2023-12-02 DIAGNOSIS — S060X0A Concussion without loss of consciousness, initial encounter: Secondary | ICD-10-CM | POA: Diagnosis not present

## 2023-12-02 DIAGNOSIS — S32592A Other specified fracture of left pubis, initial encounter for closed fracture: Secondary | ICD-10-CM | POA: Insufficient documentation

## 2023-12-02 DIAGNOSIS — S32512A Fracture of superior rim of left pubis, initial encounter for closed fracture: Secondary | ICD-10-CM | POA: Diagnosis not present

## 2023-12-02 DIAGNOSIS — I517 Cardiomegaly: Secondary | ICD-10-CM | POA: Diagnosis not present

## 2023-12-02 DIAGNOSIS — I10 Essential (primary) hypertension: Secondary | ICD-10-CM | POA: Diagnosis not present

## 2023-12-02 DIAGNOSIS — M8580 Other specified disorders of bone density and structure, unspecified site: Secondary | ICD-10-CM | POA: Diagnosis not present

## 2023-12-02 DIAGNOSIS — G9389 Other specified disorders of brain: Secondary | ICD-10-CM | POA: Diagnosis not present

## 2023-12-02 DIAGNOSIS — W01198A Fall on same level from slipping, tripping and stumbling with subsequent striking against other object, initial encounter: Secondary | ICD-10-CM | POA: Insufficient documentation

## 2023-12-02 DIAGNOSIS — Z8616 Personal history of COVID-19: Secondary | ICD-10-CM | POA: Insufficient documentation

## 2023-12-02 DIAGNOSIS — R079 Chest pain, unspecified: Secondary | ICD-10-CM | POA: Diagnosis not present

## 2023-12-02 DIAGNOSIS — S161XXA Strain of muscle, fascia and tendon at neck level, initial encounter: Secondary | ICD-10-CM | POA: Insufficient documentation

## 2023-12-02 DIAGNOSIS — R918 Other nonspecific abnormal finding of lung field: Secondary | ICD-10-CM | POA: Diagnosis not present

## 2023-12-02 DIAGNOSIS — S199XXA Unspecified injury of neck, initial encounter: Secondary | ICD-10-CM | POA: Diagnosis not present

## 2023-12-02 DIAGNOSIS — R9089 Other abnormal findings on diagnostic imaging of central nervous system: Secondary | ICD-10-CM | POA: Diagnosis not present

## 2023-12-02 DIAGNOSIS — R911 Solitary pulmonary nodule: Secondary | ICD-10-CM | POA: Diagnosis not present

## 2023-12-02 DIAGNOSIS — R0989 Other specified symptoms and signs involving the circulatory and respiratory systems: Secondary | ICD-10-CM | POA: Diagnosis not present

## 2023-12-02 DIAGNOSIS — Z79899 Other long term (current) drug therapy: Secondary | ICD-10-CM | POA: Insufficient documentation

## 2023-12-02 DIAGNOSIS — W19XXXA Unspecified fall, initial encounter: Secondary | ICD-10-CM

## 2023-12-02 DIAGNOSIS — S0990XA Unspecified injury of head, initial encounter: Secondary | ICD-10-CM | POA: Diagnosis not present

## 2023-12-02 DIAGNOSIS — Z859 Personal history of malignant neoplasm, unspecified: Secondary | ICD-10-CM | POA: Insufficient documentation

## 2023-12-02 DIAGNOSIS — M51369 Other intervertebral disc degeneration, lumbar region without mention of lumbar back pain or lower extremity pain: Secondary | ICD-10-CM | POA: Diagnosis not present

## 2023-12-02 MED ORDER — OXYCODONE-ACETAMINOPHEN 5-325 MG PO TABS: 1.0000 | ORAL_TABLET | Freq: Four times a day (QID) | ORAL | 0 refills | Status: DC | PRN

## 2023-12-02 MED ORDER — OXYCODONE-ACETAMINOPHEN 5-325 MG PO TABS: 1.0000 | ORAL_TABLET | Freq: Once | ORAL | Status: DC

## 2023-12-02 NOTE — Discharge Instructions (Addendum)
 You can call Dr Clarice to help with rehab placement  Also - you may need CAT scan of your chest to evaluate the lung nodule-  call Dr Clarice for the Cat scan of the chest

## 2023-12-02 NOTE — ED Notes (Signed)
 Pt discharged home after verbalizing understanding of discharge instructions; nad noted.

## 2023-12-02 NOTE — Progress Notes (Signed)
 CSW received call from Diplomatic Services operational officer at [facility] stating patient had a pending TOC consult.  CSW received consult for possible SNF placement. Patient has [facility] and would require a PT/OT evaluation for SNF placement.  CSW has informed Dr. Midge information.  Niels Portugal, MSW, LCSW Transitions of Care  Clinical Social Worker II [phone]

## 2023-12-02 NOTE — ED Provider Notes (Signed)
 [place] EMERGENCY DEPARTMENT AT [facility] Provider Note   CSN: [id number] Arrival date & time: 12/02/23  1053     Patient presents with: Daisy Oliver is a 88 y.o. female.   The history is provided by the patient.  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  No LOC, she is not on anticoagulation.  She also reports pain in her left hip. She reports mild pain in her neck and back.  No chest or abdominal pain.  She went to another ER but waited several hours and went home.  Her pain continues.    Past Medical History:  Diagnosis Date   Acute intermittent porphyria (HCC)    diagnosed at age 73   Cancer [facility])    CNS lymphoma    COVID-19 08/26/2020   Fracture of radial head, right, closed 09/16/2012   GERD (gastroesophageal reflux disease)    pepcid  prn   Hypertension    Hypothyroidism    PONV (postoperative nausea and vomiting)     Prior to Admission medications   Medication Sig Start Date End Date Taking? Authorizing Provider  oxyCODONE -acetaminophen  (PERCOCET/ROXICET) 5-325 MG tablet Take 1 tablet by mouth every 6 (six) hours as needed for severe pain (pain score 7-10). 12/02/23  Yes Midge Golas, MD  sertraline (ZOLOFT) 50 MG tablet See admin instructions. 1/2 tablet for 4 days then 1 each morning Orally Once a day 11/11/23  Yes [provider]  Ascorbic Acid (VITAMIN C PO) Take 1 tablet by mouth daily.    [provider]  bacitracin  ointment Apply 1 Application topically daily. 10/07/23   Kehrli, Kelsey F, PA-C  BIOTIN PO Take 1 tablet by mouth 2 (two) times daily.    [provider]  celecoxib (CELEBREX) 100 MG capsule Take 100 mg by mouth 2 (two) times daily as needed.    [provider]  famotidine  (PEPCID ) 20 MG tablet Take 20 mg by mouth at bedtime as needed for  indigestion.    [provider]  gabapentin  (NEURONTIN ) 100 MG capsule TAKE 1 CAPSULE BY MOUTH TWICE A DAY 07/08/23   Vaslow, Zachary K, MD  hydroxychloroquine (PLAQUENIL) 200 MG tablet as directed Orally    [provider]  liothyronine  (CYTOMEL ) 5 MCG tablet TAKE 5 MCG BY MOUTH IN THE MORNING AND AT BEDTIME. 01/28/23   Wilkinson, Dana E, NP  MAGNESIUM  PO Take 1 capsule by mouth 2 (two) times daily.    [provider]  Melatonin 10 MG TABS Take 10 mg by mouth at bedtime.    [provider]  Menthol, Topical Analgesic, (BIOFREEZE EX) Apply 1 Application topically at bedtime as needed (leg pain).    [provider]  methylphenidate  (RITALIN ) 5 MG tablet Take 1 tablet (5 mg total) by mouth 2 (two) times daily. 10/26/23   Vaslow, Zachary K, MD  Misc Natural Products (OSTEO BI-FLEX JOINT SHIELD PO) Take 1 tablet by mouth 2 (two) times daily. Patient not taking: Reported on 10/26/2023    [provider]  Multiple Vitamins-Minerals (ZINC  PO) Take 1 tablet by mouth 2 (two) times daily.    [provider]  MYRBETRIQ 50 MG TB24 tablet 1 tablet Orally Once a day for 30 days 11/05/22   [provider]  olmesartan  (BENICAR ) 20 MG tablet Take  1 tablet  Daily  for BP Patient not taking: Reported on 10/26/2023 07/30/22   Tonita Fallow, MD  Polyethyl Glycol-Propyl Glycol (SYSTANE) 0.4-0.3 % GEL ophthalmic gel Place 1 Application into both eyes at bedtime.    [provider]  TAURINE  PO Take 1 capsule by mouth daily.    [provider]    Allergies: Barbiturates, Other, Pentothal [thiopental], and Sulfa antibiotics    Review of Systems  Musculoskeletal:  Positive for back pain and neck pain.  Neurological:  Positive for headaches.    Updated Vital Signs BP (!) 145/57   Pulse (!) 55   Temp 98.8 F (37.1 C) (Oral)   Resp 18   SpO2 94%   Physical Exam CONSTITUTIONAL: Elderly and frail HEAD:  Normocephalic/atraumatic, tenderness to posterior scalp EYES: EOMI/PERRL ENMT: Mucous membranes moist NECK: supple no meningeal signs SPINE/BACK: Diffuse cervical and upper thoracic tenderness No bruising/crepitance/stepoffs noted to spine CV: S1/S2 noted, no murmurs/rubs/gallops noted LUNGS: Lungs are clear to auscultation bilaterally, no apparent distress ABDOMEN: soft, nontender NEURO: Pt is awake/alert/appropriate, moves all extremitiesx4.  No facial droop.   EXTREMITIES: pulses normal/equal, full ROM Tenderness with range of motion of left hip All other extremities/joints palpated/ranged and nontender SKIN: warm, color normal PSYCH: no abnormalities of mood noted, alert and oriented to situation  (all labs ordered are listed, but only abnormal results are displayed) Labs Reviewed - No data to display  EKG: None  Radiology: CT PELVIS WO CONTRAST Result Date: 12/02/2023 CLINICAL DATA:  Left groin pain after fall last night. EXAM: CT PELVIS WITHOUT CONTRAST TECHNIQUE: Multidetector CT imaging of the pelvis was performed following the standard protocol without intravenous contrast. RADIATION DOSE REDUCTION: This exam was performed according to the departmental dose-optimization program which includes automated exposure control, adjustment of the mA and/or kV according to patient size and/or use of iterative reconstruction technique. COMPARISON:  Aug 28, 2021. FINDINGS: Urinary Tract:  Urinary bladder is unremarkable. Bowel:  Unremarkable visualized pelvic bowel loops. Vascular/Lymphatic: Aortic atherosclerosis.  No adenopathy. Reproductive:  No mass or other significant abnormality Other:  No ascites or hernia. Musculoskeletal: Minimally displaced fracture is seen involving left inferior pubic ramus. Nondisplaced fracture is seen involving junction of left superior pubic ramus and acetabulum. Hip joints are unremarkable. Proximal femurs are unremarkable. Probable old bilateral sacral  fractures are noted. IMPRESSION: 1. Minimally displaced left inferior pubic ramus fracture. Nondisplaced fracture involving junction of left superior pubic ramus and acetabulum. 2. Aortic atherosclerosis. Aortic Atherosclerosis (ICD10-I70.0). Electronically Signed   By: Lynwood Landy Raddle M.D.   On: 12/02/2023 13:12   DG Hip Unilat W or Wo Pelvis 2-3 Views Left Result Date: 12/02/2023 CLINICAL DATA:  painafter fall EXAM: DG HIP (WITH OR WITHOUT PELVIS) 2-3V LEFT COMPARISON:  Aug 28, 2021 FINDINGS: Osteopenia.Mildly displaced fracture of the inferior left pubic ramus. Cortical disruption of the left iliopectineal line.No dislocation.Lumbar degenerative disc disease.Soft tissues are unremarkable. IMPRESSION: 1. Osteopenia. Cortical disruption of the left iliopectineal line, worrisome for an anterior column fracture of the left acetabulum. A follow-up pelvic CT is recommended for further characterization. 2. Mildly displaced fracture of the inferior left pubic ramus. Electronically Signed   By: Rogelia Myers M.D.   On: 12/02/2023 12:10   DG Chest Portable 1 View Result Date: 12/02/2023 CLINICAL DATA:  pain after fall. EXAM: PORTABLE CHEST - 1 VIEW COMPARISON:  Aug 28, 2021 FINDINGS: Lower lung volumes. Nodular opacity in the retrocardiac left lung base measuring 1.8 cm. No pleural effusion or pneumothorax. Mild cardiomegaly.Aortic atherosclerosis.No acute fracture  or destructive lesion. Multilevel thoracic osteophytosis. IMPRESSION: Nodular opacity in the retrocardiac left lung base, measuring 1.8 cm, which may represent bronchopneumonia, atelectasis, or a newly developing lung nodule. Nonemergent chest CT should be considered for further characterization. Electronically Signed   By: Rogelia Myers M.D.   On: 12/02/2023 12:05   CT Head Wo Contrast Result Date: 12/02/2023 CLINICAL DATA:  Clemens last night with trauma to the head and neck EXAM: CT HEAD WITHOUT CONTRAST CT CERVICAL SPINE WITHOUT CONTRAST TECHNIQUE:  Multidetector CT imaging of the head and cervical spine was performed following the standard protocol without intravenous contrast. Multiplanar CT image reconstructions of the cervical spine were also generated. RADIATION DOSE REDUCTION: This exam was performed according to the departmental dose-optimization program which includes automated exposure control, adjustment of the mA and/or kV according to patient size and/or use of iterative reconstruction technique. COMPARISON:  10/07/2023 FINDINGS: CT HEAD FINDINGS Brain: Age related volume loss. Chronic encephalomalacia in the right frontal lobe. Chronic small-vessel ischemic changes of the hemispheric white matter. No sign of acute stroke, mass, hemorrhage, hydrocephalus or extra-axial collection. Vascular: Negative Skull: Negative Sinuses/Orbits: Clear/normal Other: None CT CERVICAL SPINE FINDINGS Alignment: Minimal scoliosis. Skull base and vertebrae: No regional fracture. 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 Soft tissues and spinal canal: No traumatic soft tissue finding. Disc levels: Degenerative spondylosis at C2-3, C3-4, C5-6 and C6-7. No compressive canal stenosis. Bony foraminal narrowing at C5-6 and C6-7 that could possibly cause neural compression. Upper chest: Negative Other: None IMPRESSION: HEAD CT: No acute or traumatic finding. Age related volume loss. Chronic encephalomalacia in the right frontal lobe. Chronic small-vessel ischemic changes of the hemispheric white matter. CERVICAL SPINE CT: No acute or traumatic finding. 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 Degenerative spondylosis at C2-3, C3-4, C5-6 and C6-7. Bony foraminal narrowing at C5-6 and C6-7 that could possibly cause neural compression. Electronically Signed   By: Oneil Officer M.D.   On: 12/02/2023 11:58   CT Cervical Spine Wo Contrast Result Date: 12/02/2023 CLINICAL DATA:  Clemens last night with trauma to the head and neck EXAM: CT HEAD WITHOUT CONTRAST CT  CERVICAL SPINE WITHOUT CONTRAST TECHNIQUE: Multidetector CT imaging of the head and cervical spine was performed following the standard protocol without intravenous contrast. Multiplanar CT image reconstructions of the cervical spine were also generated. RADIATION DOSE REDUCTION: This exam was performed according to the departmental dose-optimization program which includes automated exposure control, adjustment of the mA and/or kV according to patient size and/or use of iterative reconstruction technique. COMPARISON:  10/07/2023 FINDINGS: CT HEAD FINDINGS Brain: Age related volume loss. Chronic encephalomalacia in the right frontal lobe. Chronic small-vessel ischemic changes of the hemispheric white matter. No sign of acute stroke, mass, hemorrhage, hydrocephalus or extra-axial collection. Vascular: Negative Skull: Negative Sinuses/Orbits: Clear/normal Other: None CT CERVICAL SPINE FINDINGS Alignment: Minimal scoliosis. Skull base and vertebrae: No regional fracture. 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 Soft tissues and spinal canal: No traumatic soft tissue finding. Disc levels: Degenerative spondylosis at C2-3, C3-4, C5-6 and C6-7. No compressive canal stenosis. Bony foraminal narrowing at C5-6 and C6-7 that could possibly cause neural compression. Upper chest: Negative Other: None IMPRESSION: HEAD CT: No acute or traumatic finding. Age related volume loss. Chronic encephalomalacia in the right frontal lobe. Chronic small-vessel ischemic changes of the hemispheric white matter. CERVICAL SPINE CT: No acute or traumatic finding. 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 Degenerative spondylosis at C2-3, C3-4, C5-6 and C6-7.  Bony foraminal narrowing at C5-6 and C6-7 that could possibly cause neural compression. Electronically Signed   By: Oneil Officer M.D.   On: 12/02/2023 11:58     Procedures   Medications Ordered in the ED  oxyCODONE -acetaminophen  (PERCOCET/ROXICET) 5-325 MG per tablet 1  tablet (has no administration in time range)    Clinical Course as of 12/02/23 1350  Thu Dec 02, 2023  1348 Patient presents after mechanical fall yesterday.  No findings of any injury to her head or neck.  However she does have pubic rami and acetabular injury. Discussed this with Dr. Ozell Ada on-call for orthopedic He has reviewed the imaging.  She can be weightbearing as tolerated, pain management and he will see in 2 weeks.  This is nonoperative [DW]  1349 Patient has 24-hour care at her house.  Discussed the plan with patient and her caregiver.  They expressed interest in rehab placement, I have placed home health orders and advised her to call her PCP to assist with this as well also informed them of the lung nodule and need for outpatient CT scan to look for lung cancer [DW]    Clinical Course User Index [DW] Midge Golas, MD                                 Medical Decision Making Amount and/or Complexity of Data Reviewed Radiology: ordered.  Risk Prescription drug management.   This patient presents to the ED for concern of fall and head trauma, this involves an extensive number of treatment options, and is a complaint that carries with it a high risk of complications and morbidity.  The differential diagnosis includes but is not limited to subdural hematoma, subarachnoid hemorrhage, skull fracture, concussion   Comorbidities that complicate the patient evaluation: Patient's presentation is complicated by their history of brain tumor  Social Determinants of Health: Patient's poor mobility that requires 24-hour care  increases the complexity of managing their presentation  Additional history obtained: Additional history obtained from caregiver Records reviewed previous imaging results reviewed  Imaging Studies ordered: I ordered imaging studies including CT scan pelvic, ct head/cpsine and X-ray hip xray, chest xray  I independently visualized and interpreted  imaging which showed pubic and acetabular fracture I agree with the radiologist interpretation   Medicines ordered and prescription drug management: I ordered medication including percocet  for pain  Reevaluation of the patient after these medicines showed that the patient    improved   Consultations Obtained: I requested consultation with the consultant orthopedics, and discussed  findings as well as pertinent plan - they recommend: pain management, f/u as outpatient  Reevaluation: After the interventions noted above, I reevaluated the patient and found that they have :improved  Complexity of problems addressed: Patient's presentation is most consistent with  acute presentation with potential threat to life or bodily function  Disposition: After consideration of the diagnostic results and the patient's response to treatment,  I feel that the patent would benefit from discharge  .        Final diagnoses:  Fall, initial encounter  Concussion without loss of consciousness, initial encounter  Acute strain of neck muscle, initial encounter  Closed fracture of ramus of left pubis, initial encounter (HCC)  Lung nodule    ED Discharge Orders          Ordered    oxyCODONE -acetaminophen  (PERCOCET/ROXICET) 5-325 MG tablet  Every 6 hours PRN  12/02/23 1343               Midge Golas, MD 12/02/23 1352

## 2023-12-02 NOTE — ED Triage Notes (Signed)
 Pt arrived with c/o fall last night. Went to [employer] last night LWBS. Pt hit back of head and c/o pain in L groin area.

## 2023-12-02 NOTE — ED Notes (Signed)
 Pt requests purewick; has had one in the past and is trying to get insurance approval for home use. EDP doesn't want pt walking at this time; endorses use of purewick in this patient.

## 2023-12-03 ENCOUNTER — Telehealth: Payer: Self-pay

## 2023-12-03 ENCOUNTER — Other Ambulatory Visit: Payer: Self-pay

## 2023-12-03 DIAGNOSIS — T148XXA Other injury of unspecified body region, initial encounter: Secondary | ICD-10-CM

## 2023-12-03 NOTE — Patient Outreach (Addendum)
 Complex Care Management   Visit Note  12/03/2023  Name:  Daisy Oliver MRN: [id number] DOB: [birth year]  Situation: Referral received for Complex Care Management related to Placement I obtained verbal consent from Caregiver.  Visit completed with Caregiver  on the phone  Background:   Past Medical History:  Diagnosis Date   Acute intermittent porphyria (HCC)    diagnosed at age 88   Cancer [facility])    CNS lymphoma    COVID-19 08/26/2020   Fracture of radial head, right, closed 09/16/2012   GERD (gastroesophageal reflux disease)    pepcid  prn   Hypertension    Hypothyroidism    PONV (postoperative nausea and vomiting)     Assessment: SW completed a telephone outreach with patients son, they have not located any Snf's yet. Patients PCP is currently working on an FL2. Patient has [facility] but is expected to pay private. SW contacted [facility] and they have a short term rehab private bed available for $425 per day. [facility] has a bed available SW is waiting for the liaison to contact her back. Patient do have to pay 30 days upfront [phone] Jon.  SW spoke with Jon and she states patient would have to pay upfront 30 days. SW left a Engineer, technical sales for [facility].  SW spoke with patients son and provied information for [facility] and [employer] and [place]. He will speak with patient about it and inform SW on which they would prefer.  SW receieved a telephone call back from patients son and they would like to go with [facility]. SW contacted [facility] in Admissions and she stated information would be needed on patient in order for a bed offer to be extended. An FL2 and medical history is needed to be emailed to [email or web address]. SW will work to get information sent over.   Recommendation:   No recommendations at this time  Follow Up Plan:   SW will follow up next week.  Thersia Hoar, HEDWIG, MHA [place]  [facility]  [facility] Social Worker, Population Health [phone]

## 2023-12-03 NOTE — Patient Outreach (Signed)
  Chronic Care Management   Note  12/03/2023 Name: Daisy Oliver MRN: [id number] DOB: Jun 15, 1935  Spoke with Levon Luria, Admission Coordinator at [facility] [phone]).  She requested a medication list as the FL2 was incomplete.  Follow up with Tori at Dr. Tyrus office and she is going to fax the list to [facility].  Levon also noted that the patient does not have a PASRR number and she would need that before her admission.  Levon feels they will not be able to admit the patient until Monday due to the PASRR. Call placed to patient to let he know of the situation and she understood and said she is comfortable at this time and her caregiver is present.   Plan is to have [employer]. BSW, follow up on Monday with [facility].  Barnie Gowda RN, BSN, CCM [place]  [facility] Manager Population Health Direct Dial: [phone]  Fax: [phone]

## 2023-12-03 NOTE — Patient Outreach (Addendum)
 SW spoke with Si at PCP office, she is going to fax medical information needed for patient to [facility] at [phone]. SW faxed FL2 to [facility] in Admissions to the same fax number. SW contacted patients son to inform him that fl2 and paperwork was being sent over, once information is received at [facility] they will make the decision on if they want to extend the bed offer. Son understood.  Thersia Hoar, HEDWIG, MHA [place]  [facility] Social Worker, Population Health [phone]

## 2023-12-06 ENCOUNTER — Other Ambulatory Visit: Payer: Self-pay

## 2023-12-06 NOTE — Patient Outreach (Signed)
 SW completed a telephone outreach with patients son and provided an update from Managers notes. He stated the weekend was hard with having to left patient and she definitely needs rehab. SW informed she would be following up with [employer] and provide an update. SW contact Nikki to check on new fl2 and she stated she did not receive an updated one. SW contacted Torri to check on new fl2 and pasrr number. She stated she has not receieved updated FL2.  SW spoke with Si and stated she is not sure what needs to be added to the fl2. She did send over patients medication list. Received a voicemail from [facility] that fl2 has not been received and they would need the updated fl2 and need it before the psar number can be completed. SW spoke with Levon and she stated the wrong fl2 was sent in and a long term care fl2 was needed. SW spoke with Si and provided her with the information. Si states she will work on getting it completed and faxed over to [employer] [facility].   Thersia Hoar, HEDWIG, MHA [place]  [facility] Social Worker, Population Health [phone]

## 2023-12-07 ENCOUNTER — Other Ambulatory Visit: Payer: Self-pay

## 2023-12-07 NOTE — Patient Outreach (Signed)
 SW receieved a call from Si stating that she completed the new FL2 and had her medical records department send it to [facility]. SW contacted and spoke with Levon from [employer] and they did receieve the new FL2 and it has been sent to [employer]. Currently waiting for a decision from them and she will update SW.  SW contacted patient's son to inform him of the update.  Thersia Hoar, HEDWIG, MHA [place]  [facility] Social Worker, Population Health [phone]

## 2023-12-08 ENCOUNTER — Other Ambulatory Visit: Payer: Self-pay

## 2023-12-08 DIAGNOSIS — H539 Unspecified visual disturbance: Secondary | ICD-10-CM | POA: Diagnosis not present

## 2023-12-08 DIAGNOSIS — M5030 Other cervical disc degeneration, unspecified cervical region: Secondary | ICD-10-CM | POA: Diagnosis not present

## 2023-12-08 DIAGNOSIS — R0781 Pleurodynia: Secondary | ICD-10-CM | POA: Diagnosis not present

## 2023-12-08 DIAGNOSIS — G9389 Other specified disorders of brain: Secondary | ICD-10-CM | POA: Diagnosis not present

## 2023-12-08 DIAGNOSIS — R3911 Hesitancy of micturition: Secondary | ICD-10-CM | POA: Diagnosis not present

## 2023-12-08 DIAGNOSIS — K8689 Other specified diseases of pancreas: Secondary | ICD-10-CM | POA: Diagnosis not present

## 2023-12-08 DIAGNOSIS — Z8572 Personal history of non-Hodgkin lymphomas: Secondary | ICD-10-CM | POA: Diagnosis not present

## 2023-12-08 DIAGNOSIS — R3915 Urgency of urination: Secondary | ICD-10-CM | POA: Diagnosis not present

## 2023-12-08 DIAGNOSIS — S81812D Laceration without foreign body, left lower leg, subsequent encounter: Secondary | ICD-10-CM | POA: Diagnosis not present

## 2023-12-08 NOTE — Patient Outreach (Signed)
 SW completed a telephone outreach with Levon at [facility] for a follow up on PSSR. She states they have not made an update yet, she will contact SW as soon as she gets an update. Levon stated patients son did contact her as well.  Thersia Hoar, HEDWIG, MHA [place]  [facility] Social Worker, Population Health [phone]

## 2023-12-09 NOTE — Patient Outreach (Signed)
 SW receieved a telephone call from Duncan with [employer] [employer], PSAR number has been receieved. Patient is able to go to facility on 12/10/23 for 5,950 with a cashiers check. SW contacted patients son and Si to provide update.  Thersia Hoar, HEDWIG, MHA [place]  [facility] Social Worker, Population Health [phone]

## 2023-12-11 DIAGNOSIS — R41841 Cognitive communication deficit: Secondary | ICD-10-CM | POA: Diagnosis not present

## 2023-12-11 DIAGNOSIS — M6281 Muscle weakness (generalized): Secondary | ICD-10-CM | POA: Diagnosis not present

## 2023-12-11 DIAGNOSIS — R1314 Dysphagia, pharyngoesophageal phase: Secondary | ICD-10-CM | POA: Diagnosis not present

## 2023-12-11 DIAGNOSIS — S3289XD Fracture of other parts of pelvis, subsequent encounter for fracture with routine healing: Secondary | ICD-10-CM | POA: Diagnosis not present

## 2023-12-11 DIAGNOSIS — R2689 Other abnormalities of gait and mobility: Secondary | ICD-10-CM | POA: Diagnosis not present

## 2023-12-12 DIAGNOSIS — R2689 Other abnormalities of gait and mobility: Secondary | ICD-10-CM | POA: Diagnosis not present

## 2023-12-12 DIAGNOSIS — M6281 Muscle weakness (generalized): Secondary | ICD-10-CM | POA: Diagnosis not present

## 2023-12-12 DIAGNOSIS — R41841 Cognitive communication deficit: Secondary | ICD-10-CM | POA: Diagnosis not present

## 2023-12-12 DIAGNOSIS — S3289XD Fracture of other parts of pelvis, subsequent encounter for fracture with routine healing: Secondary | ICD-10-CM | POA: Diagnosis not present

## 2023-12-12 DIAGNOSIS — R1314 Dysphagia, pharyngoesophageal phase: Secondary | ICD-10-CM | POA: Diagnosis not present

## 2023-12-13 DIAGNOSIS — F329 Major depressive disorder, single episode, unspecified: Secondary | ICD-10-CM | POA: Diagnosis not present

## 2023-12-13 DIAGNOSIS — N3281 Overactive bladder: Secondary | ICD-10-CM | POA: Diagnosis not present

## 2023-12-13 DIAGNOSIS — R41841 Cognitive communication deficit: Secondary | ICD-10-CM | POA: Diagnosis not present

## 2023-12-13 DIAGNOSIS — R262 Difficulty in walking, not elsewhere classified: Secondary | ICD-10-CM | POA: Diagnosis not present

## 2023-12-13 DIAGNOSIS — G909 Disorder of the autonomic nervous system, unspecified: Secondary | ICD-10-CM | POA: Diagnosis not present

## 2023-12-13 DIAGNOSIS — R2689 Other abnormalities of gait and mobility: Secondary | ICD-10-CM | POA: Diagnosis not present

## 2023-12-13 DIAGNOSIS — R1314 Dysphagia, pharyngoesophageal phase: Secondary | ICD-10-CM | POA: Diagnosis not present

## 2023-12-13 DIAGNOSIS — S329XXD Fracture of unspecified parts of lumbosacral spine and pelvis, subsequent encounter for fracture with routine healing: Secondary | ICD-10-CM | POA: Diagnosis not present

## 2023-12-13 DIAGNOSIS — M6281 Muscle weakness (generalized): Secondary | ICD-10-CM | POA: Diagnosis not present

## 2023-12-13 DIAGNOSIS — G894 Chronic pain syndrome: Secondary | ICD-10-CM | POA: Diagnosis not present

## 2023-12-14 ENCOUNTER — Other Ambulatory Visit: Payer: Self-pay

## 2023-12-14 DIAGNOSIS — M6281 Muscle weakness (generalized): Secondary | ICD-10-CM | POA: Diagnosis not present

## 2023-12-14 DIAGNOSIS — E039 Hypothyroidism, unspecified: Secondary | ICD-10-CM | POA: Diagnosis not present

## 2023-12-14 DIAGNOSIS — R2689 Other abnormalities of gait and mobility: Secondary | ICD-10-CM | POA: Diagnosis not present

## 2023-12-14 DIAGNOSIS — R1314 Dysphagia, pharyngoesophageal phase: Secondary | ICD-10-CM | POA: Diagnosis not present

## 2023-12-14 DIAGNOSIS — S329XXD Fracture of unspecified parts of lumbosacral spine and pelvis, subsequent encounter for fracture with routine healing: Secondary | ICD-10-CM | POA: Diagnosis not present

## 2023-12-14 DIAGNOSIS — R41841 Cognitive communication deficit: Secondary | ICD-10-CM | POA: Diagnosis not present

## 2023-12-14 NOTE — Patient Outreach (Signed)
 SW completed a follow up call with patients son, he states they did get her to [facility] on Friday. However the weekend was not great. They were not aware of her gluten free diet and she did have some gastro issues. Patient did contact a friend to come and get her. Patients son states he does have a meeting with the case managers today about her diet.  Thersia Hoar, HEDWIG, MHA [place]  [facility] Social Worker, Population Health [phone]

## 2023-12-15 DIAGNOSIS — M6281 Muscle weakness (generalized): Secondary | ICD-10-CM | POA: Diagnosis not present

## 2023-12-15 DIAGNOSIS — S329XXD Fracture of unspecified parts of lumbosacral spine and pelvis, subsequent encounter for fracture with routine healing: Secondary | ICD-10-CM | POA: Diagnosis not present

## 2023-12-15 DIAGNOSIS — R1314 Dysphagia, pharyngoesophageal phase: Secondary | ICD-10-CM | POA: Diagnosis not present

## 2023-12-15 DIAGNOSIS — R2689 Other abnormalities of gait and mobility: Secondary | ICD-10-CM | POA: Diagnosis not present

## 2023-12-15 DIAGNOSIS — R41841 Cognitive communication deficit: Secondary | ICD-10-CM | POA: Diagnosis not present

## 2023-12-16 DIAGNOSIS — S3289XD Fracture of other parts of pelvis, subsequent encounter for fracture with routine healing: Secondary | ICD-10-CM | POA: Diagnosis not present

## 2023-12-16 DIAGNOSIS — G909 Disorder of the autonomic nervous system, unspecified: Secondary | ICD-10-CM | POA: Diagnosis not present

## 2023-12-16 DIAGNOSIS — R1314 Dysphagia, pharyngoesophageal phase: Secondary | ICD-10-CM | POA: Diagnosis not present

## 2023-12-16 DIAGNOSIS — R262 Difficulty in walking, not elsewhere classified: Secondary | ICD-10-CM | POA: Diagnosis not present

## 2023-12-16 DIAGNOSIS — G894 Chronic pain syndrome: Secondary | ICD-10-CM | POA: Diagnosis not present

## 2023-12-16 DIAGNOSIS — R2689 Other abnormalities of gait and mobility: Secondary | ICD-10-CM | POA: Diagnosis not present

## 2023-12-16 DIAGNOSIS — S329XXD Fracture of unspecified parts of lumbosacral spine and pelvis, subsequent encounter for fracture with routine healing: Secondary | ICD-10-CM | POA: Diagnosis not present

## 2023-12-16 DIAGNOSIS — Z9181 History of falling: Secondary | ICD-10-CM | POA: Diagnosis not present

## 2023-12-16 DIAGNOSIS — M6281 Muscle weakness (generalized): Secondary | ICD-10-CM | POA: Diagnosis not present

## 2023-12-16 DIAGNOSIS — R41841 Cognitive communication deficit: Secondary | ICD-10-CM | POA: Diagnosis not present

## 2023-12-16 DIAGNOSIS — F331 Major depressive disorder, recurrent, moderate: Secondary | ICD-10-CM | POA: Diagnosis not present

## 2023-12-16 DIAGNOSIS — F329 Major depressive disorder, single episode, unspecified: Secondary | ICD-10-CM | POA: Diagnosis not present

## 2023-12-16 DIAGNOSIS — N3281 Overactive bladder: Secondary | ICD-10-CM | POA: Diagnosis not present

## 2023-12-17 DIAGNOSIS — G894 Chronic pain syndrome: Secondary | ICD-10-CM | POA: Diagnosis not present

## 2023-12-17 DIAGNOSIS — R262 Difficulty in walking, not elsewhere classified: Secondary | ICD-10-CM | POA: Diagnosis not present

## 2023-12-17 DIAGNOSIS — F329 Major depressive disorder, single episode, unspecified: Secondary | ICD-10-CM | POA: Diagnosis not present

## 2023-12-17 DIAGNOSIS — M6281 Muscle weakness (generalized): Secondary | ICD-10-CM | POA: Diagnosis not present

## 2023-12-17 DIAGNOSIS — G909 Disorder of the autonomic nervous system, unspecified: Secondary | ICD-10-CM | POA: Diagnosis not present

## 2023-12-17 DIAGNOSIS — N3281 Overactive bladder: Secondary | ICD-10-CM | POA: Diagnosis not present

## 2023-12-18 DIAGNOSIS — R41841 Cognitive communication deficit: Secondary | ICD-10-CM | POA: Diagnosis not present

## 2023-12-18 DIAGNOSIS — M6281 Muscle weakness (generalized): Secondary | ICD-10-CM | POA: Diagnosis not present

## 2023-12-18 DIAGNOSIS — S329XXD Fracture of unspecified parts of lumbosacral spine and pelvis, subsequent encounter for fracture with routine healing: Secondary | ICD-10-CM | POA: Diagnosis not present

## 2023-12-18 DIAGNOSIS — R2689 Other abnormalities of gait and mobility: Secondary | ICD-10-CM | POA: Diagnosis not present

## 2023-12-18 DIAGNOSIS — R1314 Dysphagia, pharyngoesophageal phase: Secondary | ICD-10-CM | POA: Diagnosis not present

## 2023-12-20 DIAGNOSIS — R1314 Dysphagia, pharyngoesophageal phase: Secondary | ICD-10-CM | POA: Diagnosis not present

## 2023-12-20 DIAGNOSIS — R2689 Other abnormalities of gait and mobility: Secondary | ICD-10-CM | POA: Diagnosis not present

## 2023-12-20 DIAGNOSIS — N3281 Overactive bladder: Secondary | ICD-10-CM | POA: Diagnosis not present

## 2023-12-20 DIAGNOSIS — Z9181 History of falling: Secondary | ICD-10-CM | POA: Diagnosis not present

## 2023-12-20 DIAGNOSIS — S3289XD Fracture of other parts of pelvis, subsequent encounter for fracture with routine healing: Secondary | ICD-10-CM | POA: Diagnosis not present

## 2023-12-20 DIAGNOSIS — S329XXD Fracture of unspecified parts of lumbosacral spine and pelvis, subsequent encounter for fracture with routine healing: Secondary | ICD-10-CM | POA: Diagnosis not present

## 2023-12-20 DIAGNOSIS — R41841 Cognitive communication deficit: Secondary | ICD-10-CM | POA: Diagnosis not present

## 2023-12-20 DIAGNOSIS — F329 Major depressive disorder, single episode, unspecified: Secondary | ICD-10-CM | POA: Diagnosis not present

## 2023-12-20 DIAGNOSIS — G909 Disorder of the autonomic nervous system, unspecified: Secondary | ICD-10-CM | POA: Diagnosis not present

## 2023-12-20 DIAGNOSIS — M6281 Muscle weakness (generalized): Secondary | ICD-10-CM | POA: Diagnosis not present

## 2023-12-20 DIAGNOSIS — R262 Difficulty in walking, not elsewhere classified: Secondary | ICD-10-CM | POA: Diagnosis not present

## 2023-12-20 DIAGNOSIS — G894 Chronic pain syndrome: Secondary | ICD-10-CM | POA: Diagnosis not present

## 2023-12-20 DIAGNOSIS — F331 Major depressive disorder, recurrent, moderate: Secondary | ICD-10-CM | POA: Diagnosis not present

## 2023-12-21 DIAGNOSIS — R1314 Dysphagia, pharyngoesophageal phase: Secondary | ICD-10-CM | POA: Diagnosis not present

## 2023-12-21 DIAGNOSIS — R2689 Other abnormalities of gait and mobility: Secondary | ICD-10-CM | POA: Diagnosis not present

## 2023-12-21 DIAGNOSIS — M6281 Muscle weakness (generalized): Secondary | ICD-10-CM | POA: Diagnosis not present

## 2023-12-21 DIAGNOSIS — R41841 Cognitive communication deficit: Secondary | ICD-10-CM | POA: Diagnosis not present

## 2023-12-21 DIAGNOSIS — S329XXD Fracture of unspecified parts of lumbosacral spine and pelvis, subsequent encounter for fracture with routine healing: Secondary | ICD-10-CM | POA: Diagnosis not present

## 2023-12-22 DIAGNOSIS — R41841 Cognitive communication deficit: Secondary | ICD-10-CM | POA: Diagnosis not present

## 2023-12-22 DIAGNOSIS — S329XXD Fracture of unspecified parts of lumbosacral spine and pelvis, subsequent encounter for fracture with routine healing: Secondary | ICD-10-CM | POA: Diagnosis not present

## 2023-12-22 DIAGNOSIS — N3281 Overactive bladder: Secondary | ICD-10-CM | POA: Diagnosis not present

## 2023-12-22 DIAGNOSIS — M6281 Muscle weakness (generalized): Secondary | ICD-10-CM | POA: Diagnosis not present

## 2023-12-22 DIAGNOSIS — G894 Chronic pain syndrome: Secondary | ICD-10-CM | POA: Diagnosis not present

## 2023-12-22 DIAGNOSIS — F329 Major depressive disorder, single episode, unspecified: Secondary | ICD-10-CM | POA: Diagnosis not present

## 2023-12-22 DIAGNOSIS — R2689 Other abnormalities of gait and mobility: Secondary | ICD-10-CM | POA: Diagnosis not present

## 2023-12-22 DIAGNOSIS — G909 Disorder of the autonomic nervous system, unspecified: Secondary | ICD-10-CM | POA: Diagnosis not present

## 2023-12-22 DIAGNOSIS — R1314 Dysphagia, pharyngoesophageal phase: Secondary | ICD-10-CM | POA: Diagnosis not present

## 2023-12-22 DIAGNOSIS — R262 Difficulty in walking, not elsewhere classified: Secondary | ICD-10-CM | POA: Diagnosis not present

## 2023-12-23 DIAGNOSIS — R2689 Other abnormalities of gait and mobility: Secondary | ICD-10-CM | POA: Diagnosis not present

## 2023-12-23 DIAGNOSIS — F329 Major depressive disorder, single episode, unspecified: Secondary | ICD-10-CM | POA: Diagnosis not present

## 2023-12-23 DIAGNOSIS — R262 Difficulty in walking, not elsewhere classified: Secondary | ICD-10-CM | POA: Diagnosis not present

## 2023-12-23 DIAGNOSIS — S329XXD Fracture of unspecified parts of lumbosacral spine and pelvis, subsequent encounter for fracture with routine healing: Secondary | ICD-10-CM | POA: Diagnosis not present

## 2023-12-23 DIAGNOSIS — G894 Chronic pain syndrome: Secondary | ICD-10-CM | POA: Diagnosis not present

## 2023-12-23 DIAGNOSIS — M6281 Muscle weakness (generalized): Secondary | ICD-10-CM | POA: Diagnosis not present

## 2023-12-23 DIAGNOSIS — N3281 Overactive bladder: Secondary | ICD-10-CM | POA: Diagnosis not present

## 2023-12-23 DIAGNOSIS — R41841 Cognitive communication deficit: Secondary | ICD-10-CM | POA: Diagnosis not present

## 2023-12-23 DIAGNOSIS — G909 Disorder of the autonomic nervous system, unspecified: Secondary | ICD-10-CM | POA: Diagnosis not present

## 2023-12-23 DIAGNOSIS — R1314 Dysphagia, pharyngoesophageal phase: Secondary | ICD-10-CM | POA: Diagnosis not present

## 2023-12-24 DIAGNOSIS — M6281 Muscle weakness (generalized): Secondary | ICD-10-CM | POA: Diagnosis not present

## 2023-12-24 DIAGNOSIS — R262 Difficulty in walking, not elsewhere classified: Secondary | ICD-10-CM | POA: Diagnosis not present

## 2023-12-24 DIAGNOSIS — G909 Disorder of the autonomic nervous system, unspecified: Secondary | ICD-10-CM | POA: Diagnosis not present

## 2023-12-24 DIAGNOSIS — N3281 Overactive bladder: Secondary | ICD-10-CM | POA: Diagnosis not present

## 2023-12-24 DIAGNOSIS — F329 Major depressive disorder, single episode, unspecified: Secondary | ICD-10-CM | POA: Diagnosis not present

## 2023-12-24 DIAGNOSIS — G894 Chronic pain syndrome: Secondary | ICD-10-CM | POA: Diagnosis not present

## 2023-12-25 DIAGNOSIS — I129 Hypertensive chronic kidney disease with stage 1 through stage 4 chronic kidney disease, or unspecified chronic kidney disease: Secondary | ICD-10-CM | POA: Diagnosis not present

## 2023-12-25 DIAGNOSIS — S3289XD Fracture of other parts of pelvis, subsequent encounter for fracture with routine healing: Secondary | ICD-10-CM | POA: Diagnosis not present

## 2023-12-25 DIAGNOSIS — F339 Major depressive disorder, recurrent, unspecified: Secondary | ICD-10-CM | POA: Diagnosis not present

## 2023-12-25 DIAGNOSIS — R1310 Dysphagia, unspecified: Secondary | ICD-10-CM | POA: Diagnosis not present

## 2023-12-25 DIAGNOSIS — Z8572 Personal history of non-Hodgkin lymphomas: Secondary | ICD-10-CM | POA: Diagnosis not present

## 2023-12-25 DIAGNOSIS — G629 Polyneuropathy, unspecified: Secondary | ICD-10-CM | POA: Diagnosis not present

## 2023-12-25 DIAGNOSIS — M1711 Unilateral primary osteoarthritis, right knee: Secondary | ICD-10-CM | POA: Diagnosis not present

## 2023-12-25 DIAGNOSIS — Z791 Long term (current) use of non-steroidal anti-inflammatories (NSAID): Secondary | ICD-10-CM | POA: Diagnosis not present

## 2023-12-25 DIAGNOSIS — K219 Gastro-esophageal reflux disease without esophagitis: Secondary | ICD-10-CM | POA: Diagnosis not present

## 2023-12-25 DIAGNOSIS — N3946 Mixed incontinence: Secondary | ICD-10-CM | POA: Diagnosis not present

## 2023-12-25 DIAGNOSIS — C711 Malignant neoplasm of frontal lobe: Secondary | ICD-10-CM | POA: Diagnosis not present

## 2023-12-25 DIAGNOSIS — E039 Hypothyroidism, unspecified: Secondary | ICD-10-CM | POA: Diagnosis not present

## 2023-12-25 DIAGNOSIS — N182 Chronic kidney disease, stage 2 (mild): Secondary | ICD-10-CM | POA: Diagnosis not present

## 2023-12-25 DIAGNOSIS — C83398 Diffuse large b-cell lymphoma of other extranodal and solid organ sites: Secondary | ICD-10-CM | POA: Diagnosis not present

## 2023-12-25 DIAGNOSIS — Z87891 Personal history of nicotine dependence: Secondary | ICD-10-CM | POA: Diagnosis not present

## 2023-12-28 ENCOUNTER — Emergency Department (HOSPITAL_BASED_OUTPATIENT_CLINIC_OR_DEPARTMENT_OTHER)
Admission: EM | Admit: 2023-12-28 | Discharge: 2023-12-28 | Disposition: A | Discharge status: Home / Self Care | Attending: Emergency Medicine | Admitting: Emergency Medicine

## 2023-12-28 ENCOUNTER — Emergency Department (HOSPITAL_BASED_OUTPATIENT_CLINIC_OR_DEPARTMENT_OTHER): Admitting: Radiology

## 2023-12-28 ENCOUNTER — Inpatient Hospital Stay: Attending: Neurosurgery | Admitting: Internal Medicine

## 2023-12-28 ENCOUNTER — Other Ambulatory Visit: Payer: Self-pay

## 2023-12-28 DIAGNOSIS — E039 Hypothyroidism, unspecified: Secondary | ICD-10-CM | POA: Insufficient documentation

## 2023-12-28 DIAGNOSIS — S81812A Laceration without foreign body, left lower leg, initial encounter: Secondary | ICD-10-CM | POA: Insufficient documentation

## 2023-12-28 DIAGNOSIS — Z8572 Personal history of non-Hodgkin lymphomas: Secondary | ICD-10-CM | POA: Insufficient documentation

## 2023-12-28 DIAGNOSIS — Z043 Encounter for examination and observation following other accident: Secondary | ICD-10-CM | POA: Diagnosis not present

## 2023-12-28 DIAGNOSIS — W1839XA Other fall on same level, initial encounter: Secondary | ICD-10-CM | POA: Diagnosis not present

## 2023-12-28 DIAGNOSIS — C8339 Primary central nervous system lymphoma: Secondary | ICD-10-CM | POA: Diagnosis not present

## 2023-12-28 DIAGNOSIS — I10 Essential (primary) hypertension: Secondary | ICD-10-CM | POA: Insufficient documentation

## 2023-12-28 DIAGNOSIS — S8992XA Unspecified injury of left lower leg, initial encounter: Secondary | ICD-10-CM | POA: Diagnosis present

## 2023-12-28 LAB — CBC
HCT: 36.3 % (ref 36.0–46.0)
Hemoglobin: 12.3 g/dL (ref 12.0–15.0)
MCH: 32.4 pg (ref 26.0–34.0)
MCHC: 33.9 g/dL (ref 30.0–36.0)
MCV: 95.5 fL (ref 80.0–100.0)
Platelets: 194 K/uL (ref 150–400)
RBC: 3.8 MIL/uL — ABNORMAL LOW (ref 3.87–5.11)
RDW: 12.5 % (ref 11.5–15.5)
WBC: 6.3 K/uL (ref 4.0–10.5)
nRBC: 0 % (ref 0.0–0.2)

## 2023-12-28 LAB — BASIC METABOLIC PANEL WITH GFR
Anion gap: 12 (ref 5–15)
BUN: 12 mg/dL (ref 8–23)
CO2: 25 mmol/L (ref 22–32)
Calcium: 9.7 mg/dL (ref 8.9–10.3)
Chloride: 97 mmol/L — ABNORMAL LOW (ref 98–111)
Creatinine, Ser: 0.7 mg/dL (ref 0.44–1.00)
GFR, Estimated: >60 mL/min
Glucose, Bld: 94 mg/dL (ref 70–99)
Potassium: 4 mmol/L (ref 3.5–5.1)
Sodium: 134 mmol/L — ABNORMAL LOW (ref 135–145)

## 2023-12-28 MED ORDER — CEPHALEXIN 250 MG PO CAPS
500.0000 mg | ORAL_CAPSULE | Freq: Once | ORAL | Status: AC
  Administered 2023-12-28: 500 mg via ORAL
  Filled 2023-12-28: qty 2

## 2023-12-28 MED ORDER — CEPHALEXIN 500 MG PO CAPS: 500.0000 mg | ORAL_CAPSULE | Freq: Three times a day (TID) | ORAL | 0 refills | Status: DC

## 2023-12-28 NOTE — ED Provider Notes (Signed)
 [place] EMERGENCY DEPARTMENT AT [facility] Provider Note   CSN: [id number] Arrival date & time: 12/28/23  1344     Patient presents with: Fall   Daisy Oliver is a 88 y.o. female with history of hypothyroidism hypertension, GERD, CNS lymphoma, pelvic fracture.  Patient presents to ED for evaluation of fall.  States on June 28 she had a ground-level fall resulting in skin tear to left anterior shin.  Reports she has been seeing wound care, having home health aides wrapped this since wound initially occurred.  States that she has been seeing her PCP for this.  Reports that wound was healing well until this last Friday when she had another fall.  States that she left rehab AGAINST MEDICAL ADVICE and got home, had fall as result because that she lost her balance.  Denies chest pain, shortness of breath preceding this.  Denies hitting her head, taking blood thinners.  Reports that when EMS came to the scene, she was helped to her feet and EMS provider grabbed her leg worsening skin tear to left anterior shin.  She presents to ED asking for assessment of this skin tear.  She denies any new injuries that occurred after falling on Friday.  She denies fevers, nausea, vomiting.  Denies any drainage from the wound.  She apparently called her home health nurse who was unable to get wound care to assess her today so they came to the ED.   Fall       Prior to Admission medications   Medication Sig Start Date End Date Taking? Authorizing Provider  cephALEXin  (KEFLEX ) 500 MG capsule Take 1 capsule (500 mg total) by mouth 3 (three) times daily. 12/28/23  Yes Ruthell Lonni FALCON, PA-C  Ascorbic Acid (VITAMIN C PO) Take 1 tablet by mouth daily.    [provider]  bacitracin  ointment Apply 1 Application topically daily. 10/07/23   Kehrli, Kelsey F, PA-C  BIOTIN PO Take 1 tablet by mouth 2 (two) times daily.    [provider]  celecoxib (CELEBREX) 100 MG capsule Take 100 mg  by mouth 2 (two) times daily as needed.    [provider]  famotidine  (PEPCID ) 20 MG tablet Take 20 mg by mouth at bedtime as needed for indigestion.    [provider]  gabapentin  (NEURONTIN ) 100 MG capsule TAKE 1 CAPSULE BY MOUTH TWICE A DAY 07/08/23   Vaslow, Zachary K, MD  hydroxychloroquine (PLAQUENIL) 200 MG tablet as directed Orally    [provider]  liothyronine  (CYTOMEL ) 5 MCG tablet TAKE 5 MCG BY MOUTH IN THE MORNING AND AT BEDTIME. 01/28/23   Wilkinson, Dana E, NP  MAGNESIUM  PO Take 1 capsule by mouth 2 (two) times daily.    [provider]  Melatonin 10 MG TABS Take 10 mg by mouth at bedtime.    [provider]  Menthol, Topical Analgesic, (BIOFREEZE EX) Apply 1 Application topically at bedtime as needed (leg pain).    [provider]  Misc Natural Products (OSTEO BI-FLEX JOINT SHIELD PO) Take 1 tablet by mouth 2 (two) times daily. Patient not taking: Reported on 10/26/2023    [provider]  Multiple Vitamins-Minerals (ZINC  PO) Take 1 tablet by mouth 2 (two) times daily.    [provider]  MYRBETRIQ 50 MG TB24 tablet 1 tablet Orally Once a day for 30 days 11/05/22   [provider]  olmesartan  (BENICAR ) 20 MG tablet Take  1 tablet  Daily  for BP  Patient not taking: Reported on 10/26/2023 07/30/22   Tonita Fallow, MD  oxyCODONE -acetaminophen  (PERCOCET/ROXICET) 5-325 MG tablet Take 1 tablet by mouth every 6 (six) hours as needed for severe pain (pain score 7-10). 12/02/23   Midge Golas, MD  Polyethyl Glycol-Propyl Glycol (SYSTANE) 0.4-0.3 % GEL ophthalmic gel Place 1 Application into both eyes at bedtime.    [provider]  sertraline (ZOLOFT) 50 MG tablet See admin instructions. 1/2 tablet for 4 days then 1 each morning Orally Once a day 11/11/23   [provider]  TAURINE  PO Take 1 capsule by mouth daily.    [provider]    Allergies: Barbiturates, Other, Pentothal  [thiopental], and Sulfa antibiotics    Review of Systems  Skin:  Positive for wound.  All other systems reviewed and are negative.   Updated Vital Signs BP (!) 149/89   Pulse 64   Temp 98.1 F (36.7 C)   Resp 18   SpO2 96%   Physical Exam Vitals and nursing note reviewed.  Constitutional:      General: She is not in acute distress.    Appearance: She is well-developed.  HENT:     Head: Normocephalic and atraumatic.  Eyes:     Conjunctiva/sclera: Conjunctivae normal.  Cardiovascular:     Rate and Rhythm: Normal rate and regular rhythm.     Heart sounds: No murmur heard. Pulmonary:     Effort: Pulmonary effort is normal. No respiratory distress.     Breath sounds: Normal breath sounds.  Abdominal:     Palpations: Abdomen is soft.     Tenderness: There is no abdominal tenderness.  Musculoskeletal:        General: No swelling.     Cervical back: Neck supple.  Skin:    General: Skin is warm and dry.     Capillary Refill: Capillary refill takes less than 2 seconds.     Comments: Large 12 cm skin tear to left medial lower leg.  Ecchymosis accompanying.  See photo.  Neurological:     Mental Status: She is alert.  Psychiatric:        Mood and Affect: Mood normal.     (all labs ordered are listed, but only abnormal results are displayed) Labs Reviewed  CBC - Abnormal; Notable for the following components:      Result Value   RBC 3.80 (*)    All other components within normal limits  BASIC METABOLIC PANEL WITH GFR - Abnormal; Notable for the following components:   Sodium 134 (*)    Chloride 97 (*)    All other components within normal limits    EKG: None  Radiology: DG Tibia/Fibula Left Result Date: 12/28/2023 CLINICAL DATA:  sp fall with skin tear to anterior left shin, assess for underlying fx EXAM: LEFT TIBIA AND FIBULA - 2 VIEW COMPARISON:  10/07/2023 FINDINGS: There is no evidence of fracture or other focal bone lesions. No erosions or periostitis. No hip or  ankle dislocation. No focal soft tissue abnormalities are seen. No soft tissue gas or radiopaque foreign body. IMPRESSION: No fracture of the left tibia or fibula. Electronically Signed   By: Andrea Gasman M.D.   On: 12/28/2023 15:55    Procedures   Medications Ordered in the ED  cephALEXin  (KEFLEX ) capsule 500 mg (has no administration in time range)    Medical Decision Making Amount and/or Complexity of Data Reviewed Labs: ordered. Radiology: ordered.   This is a 88 year old female presenting to the  ED out of concern of skin tear.  On exam, hemodynamically stable.  Afebrile and nontachycardic.  Lung sounds clear bilaterally, no hypoxia.  Abdomen soft and compressible.  Neurological examination at baseline.  Patient does have large skin tear to left medial lower extremity.  No active bleeding.  Basic labs collected.  Basic labs grossly unremarkable.  X-ray imaging of left lower leg collected.  No acute process noted.  Patient had wound care performed, leg was then wrapped.  The patient was sent home with supplies such as rolling impregnated gauze, Kerlix, gauze.  Patient home health aide at bedside reports that she contacted home health aide company who reports patient will be seen by wound care at some point this week.  The patient was advised to change her dressings once a day until seen by wound care.  She was sent home with multiple supplies to assist in caring for her wound at home.  She was given strict return precautions and she voiced understanding.  Patient home health aide is concerned patient could develop infection so patient was placed on Keflex  which she will take for the next 5 days 3 times daily.  She was advised to follow-up with PCP.  Given return precautions and she voiced understanding.  Stable to discharge.   Final diagnoses:  Skin tear of left lower leg without complication, initial encounter    ED Discharge Orders          Ordered    cephALEXin  (KEFLEX ) 500 MG  capsule  3 times daily        12/28/23 1823               Ruthell Lonni JULIANNA DEVONNA 12/28/23 MAURINE Armenta Canning, MD 12/29/23 [phone]

## 2023-12-28 NOTE — Discharge Instructions (Signed)
 It was a pleasure taking part in your care.  As discussed, please change dressing once a day until seen by wound care.  Please begin taking Keflex  3 times a day for 5 days.  You received your first dose here tonight.  Use supplies provided to you to care for wound at home.  Return to ED with new symptom such as fevers nausea or vomiting.  Follow-up with home health/wound care.

## 2023-12-28 NOTE — Progress Notes (Signed)
 I connected with Daisy Oliver on 12/28/23 at 10:00 AM EDT by telephone visit and verified that I am speaking with the correct person using two identifiers.  I discussed the limitations, risks, security and privacy concerns of performing an evaluation and management service by telemedicine and the availability of in-person appointments. I also discussed with the patient that there may be a patient responsible charge related to this service. The patient expressed understanding and agreed to proceed.   Other persons participating in the visit and their role in the encounter:  n/a  Patient's location:  Home Provider's location:  Office Chief Complaint:  Primary CNS lymphoma - Plan: MR BRAIN W WO CONTRAST  History of Present Ilness: Daisy Oliver reports no significant improvement in energy levels since starting the Ritalin  stimulant.  She is no longer dosing this.  Currently recovering from fall injury and leg wound at home.    Observations: Language and cognition at baseline  Assessment and Plan: Primary CNS lymphoma - Plan: MR BRAIN W WO CONTRAST  Clinically stable from neurologic standpoint, she has multiple orthopedic and soft tissue issues at this time taking priority.  May discontinue Ritalin  at this time.    Follow Up Instructions:  We ask that Daisy Oliver return to clinic in 4 months following next brain MRI, or sooner as needed.  I discussed the assessment and treatment plan with the patient.  The patient was provided an opportunity to ask questions and all were answered.  The patient agreed with the plan and demonstrated understanding of the instructions.    The patient was advised to call back or seek an in-person evaluation if the symptoms worsen or if the condition fails to improve as anticipated.    Nick Stults K Lynn Recendiz, MD   I provided 20 minutes of non face-to-face telephone visit time during this encounter, and > 50% was spent counseling as documented under my  assessment & plan.

## 2023-12-28 NOTE — ED Notes (Signed)
 Reviewed discharge instructions, medications, and home care with pt. Pt verbalized understanding and had no further questions. Pt exited ED without complications.

## 2023-12-28 NOTE — ED Notes (Addendum)
 Cleaned pt's skin tear on left lower leg with normal saline and dressed with non-stick pad, bactrim ointment and gauze. Secured with ace wrap. Pt tolerated well.

## 2023-12-28 NOTE — ED Triage Notes (Addendum)
 Patient states fall on Friday afternoon. States EMS helped her up after fall and thinks as they were helping her up their hands went into left leg wound. Got out of rehab recently.

## 2023-12-29 DIAGNOSIS — E039 Hypothyroidism, unspecified: Secondary | ICD-10-CM | POA: Diagnosis not present

## 2023-12-29 DIAGNOSIS — S3289XD Fracture of other parts of pelvis, subsequent encounter for fracture with routine healing: Secondary | ICD-10-CM | POA: Diagnosis not present

## 2023-12-29 DIAGNOSIS — Z8572 Personal history of non-Hodgkin lymphomas: Secondary | ICD-10-CM | POA: Diagnosis not present

## 2023-12-29 DIAGNOSIS — K219 Gastro-esophageal reflux disease without esophagitis: Secondary | ICD-10-CM | POA: Diagnosis not present

## 2023-12-29 DIAGNOSIS — G629 Polyneuropathy, unspecified: Secondary | ICD-10-CM | POA: Diagnosis not present

## 2023-12-29 DIAGNOSIS — R1310 Dysphagia, unspecified: Secondary | ICD-10-CM | POA: Diagnosis not present

## 2023-12-29 DIAGNOSIS — Z791 Long term (current) use of non-steroidal anti-inflammatories (NSAID): Secondary | ICD-10-CM | POA: Diagnosis not present

## 2023-12-29 DIAGNOSIS — N3946 Mixed incontinence: Secondary | ICD-10-CM | POA: Diagnosis not present

## 2023-12-30 DIAGNOSIS — R1310 Dysphagia, unspecified: Secondary | ICD-10-CM | POA: Diagnosis not present

## 2023-12-30 DIAGNOSIS — F339 Major depressive disorder, recurrent, unspecified: Secondary | ICD-10-CM | POA: Diagnosis not present

## 2023-12-30 DIAGNOSIS — K219 Gastro-esophageal reflux disease without esophagitis: Secondary | ICD-10-CM | POA: Diagnosis not present

## 2023-12-30 DIAGNOSIS — S3289XD Fracture of other parts of pelvis, subsequent encounter for fracture with routine healing: Secondary | ICD-10-CM | POA: Diagnosis not present

## 2023-12-30 DIAGNOSIS — E039 Hypothyroidism, unspecified: Secondary | ICD-10-CM | POA: Diagnosis not present

## 2023-12-30 DIAGNOSIS — Z791 Long term (current) use of non-steroidal anti-inflammatories (NSAID): Secondary | ICD-10-CM | POA: Diagnosis not present

## 2023-12-30 DIAGNOSIS — Z8572 Personal history of non-Hodgkin lymphomas: Secondary | ICD-10-CM | POA: Diagnosis not present

## 2023-12-30 DIAGNOSIS — G629 Polyneuropathy, unspecified: Secondary | ICD-10-CM | POA: Diagnosis not present

## 2023-12-30 DIAGNOSIS — N3946 Mixed incontinence: Secondary | ICD-10-CM | POA: Diagnosis not present

## 2023-12-31 DIAGNOSIS — C8339 Primary central nervous system lymphoma: Secondary | ICD-10-CM | POA: Diagnosis not present

## 2023-12-31 DIAGNOSIS — R197 Diarrhea, unspecified: Secondary | ICD-10-CM | POA: Diagnosis not present

## 2023-12-31 DIAGNOSIS — L03119 Cellulitis of unspecified part of limb: Secondary | ICD-10-CM | POA: Diagnosis not present

## 2023-12-31 DIAGNOSIS — R269 Unspecified abnormalities of gait and mobility: Secondary | ICD-10-CM | POA: Diagnosis not present

## 2023-12-31 DIAGNOSIS — N3944 Nocturnal enuresis: Secondary | ICD-10-CM | POA: Diagnosis not present

## 2024-01-03 DIAGNOSIS — R1310 Dysphagia, unspecified: Secondary | ICD-10-CM | POA: Diagnosis not present

## 2024-01-03 DIAGNOSIS — E039 Hypothyroidism, unspecified: Secondary | ICD-10-CM | POA: Diagnosis not present

## 2024-01-03 DIAGNOSIS — S3289XD Fracture of other parts of pelvis, subsequent encounter for fracture with routine healing: Secondary | ICD-10-CM | POA: Diagnosis not present

## 2024-01-03 DIAGNOSIS — Z8572 Personal history of non-Hodgkin lymphomas: Secondary | ICD-10-CM | POA: Diagnosis not present

## 2024-01-03 DIAGNOSIS — N3946 Mixed incontinence: Secondary | ICD-10-CM | POA: Diagnosis not present

## 2024-01-03 DIAGNOSIS — K219 Gastro-esophageal reflux disease without esophagitis: Secondary | ICD-10-CM | POA: Diagnosis not present

## 2024-01-03 DIAGNOSIS — Z791 Long term (current) use of non-steroidal anti-inflammatories (NSAID): Secondary | ICD-10-CM | POA: Diagnosis not present

## 2024-01-03 DIAGNOSIS — G629 Polyneuropathy, unspecified: Secondary | ICD-10-CM | POA: Diagnosis not present

## 2024-01-03 DIAGNOSIS — F339 Major depressive disorder, recurrent, unspecified: Secondary | ICD-10-CM | POA: Diagnosis not present

## 2024-01-06 DIAGNOSIS — K219 Gastro-esophageal reflux disease without esophagitis: Secondary | ICD-10-CM | POA: Diagnosis not present

## 2024-01-06 DIAGNOSIS — S3289XD Fracture of other parts of pelvis, subsequent encounter for fracture with routine healing: Secondary | ICD-10-CM | POA: Diagnosis not present

## 2024-01-06 DIAGNOSIS — G629 Polyneuropathy, unspecified: Secondary | ICD-10-CM | POA: Diagnosis not present

## 2024-01-06 DIAGNOSIS — F339 Major depressive disorder, recurrent, unspecified: Secondary | ICD-10-CM | POA: Diagnosis not present

## 2024-01-06 DIAGNOSIS — Z791 Long term (current) use of non-steroidal anti-inflammatories (NSAID): Secondary | ICD-10-CM | POA: Diagnosis not present

## 2024-01-06 DIAGNOSIS — Z8572 Personal history of non-Hodgkin lymphomas: Secondary | ICD-10-CM | POA: Diagnosis not present

## 2024-01-06 DIAGNOSIS — E039 Hypothyroidism, unspecified: Secondary | ICD-10-CM | POA: Diagnosis not present

## 2024-01-06 DIAGNOSIS — R1310 Dysphagia, unspecified: Secondary | ICD-10-CM | POA: Diagnosis not present

## 2024-01-06 DIAGNOSIS — N3946 Mixed incontinence: Secondary | ICD-10-CM | POA: Diagnosis not present

## 2024-01-07 DIAGNOSIS — N3946 Mixed incontinence: Secondary | ICD-10-CM | POA: Diagnosis not present

## 2024-01-07 DIAGNOSIS — E039 Hypothyroidism, unspecified: Secondary | ICD-10-CM | POA: Diagnosis not present

## 2024-01-07 DIAGNOSIS — Z791 Long term (current) use of non-steroidal anti-inflammatories (NSAID): Secondary | ICD-10-CM | POA: Diagnosis not present

## 2024-01-07 DIAGNOSIS — Z8572 Personal history of non-Hodgkin lymphomas: Secondary | ICD-10-CM | POA: Diagnosis not present

## 2024-01-07 DIAGNOSIS — F339 Major depressive disorder, recurrent, unspecified: Secondary | ICD-10-CM | POA: Diagnosis not present

## 2024-01-07 DIAGNOSIS — S3289XD Fracture of other parts of pelvis, subsequent encounter for fracture with routine healing: Secondary | ICD-10-CM | POA: Diagnosis not present

## 2024-01-07 DIAGNOSIS — G629 Polyneuropathy, unspecified: Secondary | ICD-10-CM | POA: Diagnosis not present

## 2024-01-07 DIAGNOSIS — R1310 Dysphagia, unspecified: Secondary | ICD-10-CM | POA: Diagnosis not present

## 2024-01-07 DIAGNOSIS — K219 Gastro-esophageal reflux disease without esophagitis: Secondary | ICD-10-CM | POA: Diagnosis not present

## 2024-01-10 DIAGNOSIS — E039 Hypothyroidism, unspecified: Secondary | ICD-10-CM | POA: Diagnosis not present

## 2024-01-10 DIAGNOSIS — G629 Polyneuropathy, unspecified: Secondary | ICD-10-CM | POA: Diagnosis not present

## 2024-01-10 DIAGNOSIS — F339 Major depressive disorder, recurrent, unspecified: Secondary | ICD-10-CM | POA: Diagnosis not present

## 2024-01-10 DIAGNOSIS — N3946 Mixed incontinence: Secondary | ICD-10-CM | POA: Diagnosis not present

## 2024-01-10 DIAGNOSIS — R1310 Dysphagia, unspecified: Secondary | ICD-10-CM | POA: Diagnosis not present

## 2024-01-10 DIAGNOSIS — S3289XD Fracture of other parts of pelvis, subsequent encounter for fracture with routine healing: Secondary | ICD-10-CM | POA: Diagnosis not present

## 2024-01-10 DIAGNOSIS — Z8572 Personal history of non-Hodgkin lymphomas: Secondary | ICD-10-CM | POA: Diagnosis not present

## 2024-01-10 DIAGNOSIS — Z791 Long term (current) use of non-steroidal anti-inflammatories (NSAID): Secondary | ICD-10-CM | POA: Diagnosis not present

## 2024-01-10 DIAGNOSIS — K219 Gastro-esophageal reflux disease without esophagitis: Secondary | ICD-10-CM | POA: Diagnosis not present

## 2024-01-11 DIAGNOSIS — Z791 Long term (current) use of non-steroidal anti-inflammatories (NSAID): Secondary | ICD-10-CM | POA: Diagnosis not present

## 2024-01-11 DIAGNOSIS — N3946 Mixed incontinence: Secondary | ICD-10-CM | POA: Diagnosis not present

## 2024-01-11 DIAGNOSIS — K219 Gastro-esophageal reflux disease without esophagitis: Secondary | ICD-10-CM | POA: Diagnosis not present

## 2024-01-11 DIAGNOSIS — M1711 Unilateral primary osteoarthritis, right knee: Secondary | ICD-10-CM | POA: Diagnosis not present

## 2024-01-11 DIAGNOSIS — F339 Major depressive disorder, recurrent, unspecified: Secondary | ICD-10-CM | POA: Diagnosis not present

## 2024-01-11 DIAGNOSIS — E039 Hypothyroidism, unspecified: Secondary | ICD-10-CM | POA: Diagnosis not present

## 2024-01-11 DIAGNOSIS — G629 Polyneuropathy, unspecified: Secondary | ICD-10-CM | POA: Diagnosis not present

## 2024-01-11 DIAGNOSIS — Z8572 Personal history of non-Hodgkin lymphomas: Secondary | ICD-10-CM | POA: Diagnosis not present

## 2024-01-11 DIAGNOSIS — Z87891 Personal history of nicotine dependence: Secondary | ICD-10-CM | POA: Diagnosis not present

## 2024-01-11 DIAGNOSIS — I129 Hypertensive chronic kidney disease with stage 1 through stage 4 chronic kidney disease, or unspecified chronic kidney disease: Secondary | ICD-10-CM | POA: Diagnosis not present

## 2024-01-11 DIAGNOSIS — S3289XD Fracture of other parts of pelvis, subsequent encounter for fracture with routine healing: Secondary | ICD-10-CM | POA: Diagnosis not present

## 2024-01-11 DIAGNOSIS — C711 Malignant neoplasm of frontal lobe: Secondary | ICD-10-CM | POA: Diagnosis not present

## 2024-01-11 DIAGNOSIS — R1310 Dysphagia, unspecified: Secondary | ICD-10-CM | POA: Diagnosis not present

## 2024-01-11 DIAGNOSIS — N182 Chronic kidney disease, stage 2 (mild): Secondary | ICD-10-CM | POA: Diagnosis not present

## 2024-01-11 DIAGNOSIS — C83398 Diffuse large b-cell lymphoma of other extranodal and solid organ sites: Secondary | ICD-10-CM | POA: Diagnosis not present

## 2024-01-14 DIAGNOSIS — S3289XD Fracture of other parts of pelvis, subsequent encounter for fracture with routine healing: Secondary | ICD-10-CM | POA: Diagnosis not present

## 2024-01-14 DIAGNOSIS — R1310 Dysphagia, unspecified: Secondary | ICD-10-CM | POA: Diagnosis not present

## 2024-01-14 DIAGNOSIS — K219 Gastro-esophageal reflux disease without esophagitis: Secondary | ICD-10-CM | POA: Diagnosis not present

## 2024-01-14 DIAGNOSIS — N3946 Mixed incontinence: Secondary | ICD-10-CM | POA: Diagnosis not present

## 2024-01-14 DIAGNOSIS — F339 Major depressive disorder, recurrent, unspecified: Secondary | ICD-10-CM | POA: Diagnosis not present

## 2024-01-14 DIAGNOSIS — G629 Polyneuropathy, unspecified: Secondary | ICD-10-CM | POA: Diagnosis not present

## 2024-01-14 DIAGNOSIS — E039 Hypothyroidism, unspecified: Secondary | ICD-10-CM | POA: Diagnosis not present

## 2024-01-14 DIAGNOSIS — Z791 Long term (current) use of non-steroidal anti-inflammatories (NSAID): Secondary | ICD-10-CM | POA: Diagnosis not present

## 2024-01-17 DIAGNOSIS — Z8572 Personal history of non-Hodgkin lymphomas: Secondary | ICD-10-CM | POA: Diagnosis not present

## 2024-01-17 DIAGNOSIS — F339 Major depressive disorder, recurrent, unspecified: Secondary | ICD-10-CM | POA: Diagnosis not present

## 2024-01-17 DIAGNOSIS — K219 Gastro-esophageal reflux disease without esophagitis: Secondary | ICD-10-CM | POA: Diagnosis not present

## 2024-01-17 DIAGNOSIS — N3946 Mixed incontinence: Secondary | ICD-10-CM | POA: Diagnosis not present

## 2024-01-17 DIAGNOSIS — S3289XD Fracture of other parts of pelvis, subsequent encounter for fracture with routine healing: Secondary | ICD-10-CM | POA: Diagnosis not present

## 2024-01-17 DIAGNOSIS — G629 Polyneuropathy, unspecified: Secondary | ICD-10-CM | POA: Diagnosis not present

## 2024-01-17 DIAGNOSIS — E039 Hypothyroidism, unspecified: Secondary | ICD-10-CM | POA: Diagnosis not present

## 2024-01-17 DIAGNOSIS — Z791 Long term (current) use of non-steroidal anti-inflammatories (NSAID): Secondary | ICD-10-CM | POA: Diagnosis not present

## 2024-01-17 DIAGNOSIS — R1310 Dysphagia, unspecified: Secondary | ICD-10-CM | POA: Diagnosis not present

## 2024-01-18 DIAGNOSIS — G629 Polyneuropathy, unspecified: Secondary | ICD-10-CM | POA: Diagnosis not present

## 2024-01-18 DIAGNOSIS — N3946 Mixed incontinence: Secondary | ICD-10-CM | POA: Diagnosis not present

## 2024-01-18 DIAGNOSIS — F339 Major depressive disorder, recurrent, unspecified: Secondary | ICD-10-CM | POA: Diagnosis not present

## 2024-01-18 DIAGNOSIS — K219 Gastro-esophageal reflux disease without esophagitis: Secondary | ICD-10-CM | POA: Diagnosis not present

## 2024-01-18 DIAGNOSIS — Z8572 Personal history of non-Hodgkin lymphomas: Secondary | ICD-10-CM | POA: Diagnosis not present

## 2024-01-18 DIAGNOSIS — Z791 Long term (current) use of non-steroidal anti-inflammatories (NSAID): Secondary | ICD-10-CM | POA: Diagnosis not present

## 2024-01-18 DIAGNOSIS — R1310 Dysphagia, unspecified: Secondary | ICD-10-CM | POA: Diagnosis not present

## 2024-01-18 DIAGNOSIS — E039 Hypothyroidism, unspecified: Secondary | ICD-10-CM | POA: Diagnosis not present

## 2024-01-19 DIAGNOSIS — R1310 Dysphagia, unspecified: Secondary | ICD-10-CM | POA: Diagnosis not present

## 2024-01-19 DIAGNOSIS — N3946 Mixed incontinence: Secondary | ICD-10-CM | POA: Diagnosis not present

## 2024-01-19 DIAGNOSIS — G629 Polyneuropathy, unspecified: Secondary | ICD-10-CM | POA: Diagnosis not present

## 2024-01-19 DIAGNOSIS — F339 Major depressive disorder, recurrent, unspecified: Secondary | ICD-10-CM | POA: Diagnosis not present

## 2024-01-19 DIAGNOSIS — Z8572 Personal history of non-Hodgkin lymphomas: Secondary | ICD-10-CM | POA: Diagnosis not present

## 2024-01-19 DIAGNOSIS — K219 Gastro-esophageal reflux disease without esophagitis: Secondary | ICD-10-CM | POA: Diagnosis not present

## 2024-01-19 DIAGNOSIS — Z791 Long term (current) use of non-steroidal anti-inflammatories (NSAID): Secondary | ICD-10-CM | POA: Diagnosis not present

## 2024-01-19 DIAGNOSIS — S3289XD Fracture of other parts of pelvis, subsequent encounter for fracture with routine healing: Secondary | ICD-10-CM | POA: Diagnosis not present

## 2024-01-19 DIAGNOSIS — E039 Hypothyroidism, unspecified: Secondary | ICD-10-CM | POA: Diagnosis not present

## 2024-01-21 DIAGNOSIS — E039 Hypothyroidism, unspecified: Secondary | ICD-10-CM | POA: Diagnosis not present

## 2024-01-21 DIAGNOSIS — S3289XD Fracture of other parts of pelvis, subsequent encounter for fracture with routine healing: Secondary | ICD-10-CM | POA: Diagnosis not present

## 2024-01-21 DIAGNOSIS — G629 Polyneuropathy, unspecified: Secondary | ICD-10-CM | POA: Diagnosis not present

## 2024-01-21 DIAGNOSIS — F339 Major depressive disorder, recurrent, unspecified: Secondary | ICD-10-CM | POA: Diagnosis not present

## 2024-01-21 DIAGNOSIS — R1310 Dysphagia, unspecified: Secondary | ICD-10-CM | POA: Diagnosis not present

## 2024-01-21 DIAGNOSIS — N3946 Mixed incontinence: Secondary | ICD-10-CM | POA: Diagnosis not present

## 2024-01-21 DIAGNOSIS — K219 Gastro-esophageal reflux disease without esophagitis: Secondary | ICD-10-CM | POA: Diagnosis not present

## 2024-01-21 DIAGNOSIS — Z791 Long term (current) use of non-steroidal anti-inflammatories (NSAID): Secondary | ICD-10-CM | POA: Diagnosis not present

## 2024-01-21 DIAGNOSIS — Z8572 Personal history of non-Hodgkin lymphomas: Secondary | ICD-10-CM | POA: Diagnosis not present

## 2024-01-24 DIAGNOSIS — G629 Polyneuropathy, unspecified: Secondary | ICD-10-CM | POA: Diagnosis not present

## 2024-01-24 DIAGNOSIS — Z791 Long term (current) use of non-steroidal anti-inflammatories (NSAID): Secondary | ICD-10-CM | POA: Diagnosis not present

## 2024-01-24 DIAGNOSIS — Z8572 Personal history of non-Hodgkin lymphomas: Secondary | ICD-10-CM | POA: Diagnosis not present

## 2024-01-24 DIAGNOSIS — K219 Gastro-esophageal reflux disease without esophagitis: Secondary | ICD-10-CM | POA: Diagnosis not present

## 2024-01-24 DIAGNOSIS — E039 Hypothyroidism, unspecified: Secondary | ICD-10-CM | POA: Diagnosis not present

## 2024-01-24 DIAGNOSIS — F339 Major depressive disorder, recurrent, unspecified: Secondary | ICD-10-CM | POA: Diagnosis not present

## 2024-01-24 DIAGNOSIS — N3946 Mixed incontinence: Secondary | ICD-10-CM | POA: Diagnosis not present

## 2024-01-24 DIAGNOSIS — R1310 Dysphagia, unspecified: Secondary | ICD-10-CM | POA: Diagnosis not present

## 2024-01-24 DIAGNOSIS — S3289XD Fracture of other parts of pelvis, subsequent encounter for fracture with routine healing: Secondary | ICD-10-CM | POA: Diagnosis not present

## 2024-01-27 DIAGNOSIS — S81812A Laceration without foreign body, left lower leg, initial encounter: Secondary | ICD-10-CM | POA: Diagnosis not present

## 2024-01-27 DIAGNOSIS — Z23 Encounter for immunization: Secondary | ICD-10-CM | POA: Diagnosis not present

## 2024-01-27 DIAGNOSIS — F439 Reaction to severe stress, unspecified: Secondary | ICD-10-CM | POA: Diagnosis not present

## 2024-01-27 DIAGNOSIS — R911 Solitary pulmonary nodule: Secondary | ICD-10-CM | POA: Diagnosis not present

## 2024-01-28 ENCOUNTER — Other Ambulatory Visit: Payer: Self-pay | Admitting: Internal Medicine

## 2024-01-28 DIAGNOSIS — S3289XD Fracture of other parts of pelvis, subsequent encounter for fracture with routine healing: Secondary | ICD-10-CM | POA: Diagnosis not present

## 2024-01-28 DIAGNOSIS — R911 Solitary pulmonary nodule: Secondary | ICD-10-CM

## 2024-01-28 DIAGNOSIS — K219 Gastro-esophageal reflux disease without esophagitis: Secondary | ICD-10-CM | POA: Diagnosis not present

## 2024-01-28 DIAGNOSIS — F339 Major depressive disorder, recurrent, unspecified: Secondary | ICD-10-CM | POA: Diagnosis not present

## 2024-01-28 DIAGNOSIS — G629 Polyneuropathy, unspecified: Secondary | ICD-10-CM | POA: Diagnosis not present

## 2024-01-28 DIAGNOSIS — N3946 Mixed incontinence: Secondary | ICD-10-CM | POA: Diagnosis not present

## 2024-01-28 DIAGNOSIS — R1310 Dysphagia, unspecified: Secondary | ICD-10-CM | POA: Diagnosis not present

## 2024-01-28 DIAGNOSIS — E039 Hypothyroidism, unspecified: Secondary | ICD-10-CM | POA: Diagnosis not present

## 2024-01-28 DIAGNOSIS — Z8572 Personal history of non-Hodgkin lymphomas: Secondary | ICD-10-CM | POA: Diagnosis not present

## 2024-01-28 DIAGNOSIS — Z791 Long term (current) use of non-steroidal anti-inflammatories (NSAID): Secondary | ICD-10-CM | POA: Diagnosis not present

## 2024-01-31 DIAGNOSIS — Z8572 Personal history of non-Hodgkin lymphomas: Secondary | ICD-10-CM | POA: Diagnosis not present

## 2024-01-31 DIAGNOSIS — N3946 Mixed incontinence: Secondary | ICD-10-CM | POA: Diagnosis not present

## 2024-01-31 DIAGNOSIS — Z791 Long term (current) use of non-steroidal anti-inflammatories (NSAID): Secondary | ICD-10-CM | POA: Diagnosis not present

## 2024-01-31 DIAGNOSIS — S3289XD Fracture of other parts of pelvis, subsequent encounter for fracture with routine healing: Secondary | ICD-10-CM | POA: Diagnosis not present

## 2024-01-31 DIAGNOSIS — E039 Hypothyroidism, unspecified: Secondary | ICD-10-CM | POA: Diagnosis not present

## 2024-01-31 DIAGNOSIS — R1310 Dysphagia, unspecified: Secondary | ICD-10-CM | POA: Diagnosis not present

## 2024-01-31 DIAGNOSIS — K219 Gastro-esophageal reflux disease without esophagitis: Secondary | ICD-10-CM | POA: Diagnosis not present

## 2024-01-31 DIAGNOSIS — G629 Polyneuropathy, unspecified: Secondary | ICD-10-CM | POA: Diagnosis not present

## 2024-01-31 DIAGNOSIS — F339 Major depressive disorder, recurrent, unspecified: Secondary | ICD-10-CM | POA: Diagnosis not present

## 2024-02-02 ENCOUNTER — Inpatient Hospital Stay: Admission: RE | Admit: 2024-02-02 | Discharge: 2024-02-02 | Attending: Internal Medicine | Admitting: Internal Medicine

## 2024-02-02 DIAGNOSIS — S3289XD Fracture of other parts of pelvis, subsequent encounter for fracture with routine healing: Secondary | ICD-10-CM | POA: Diagnosis not present

## 2024-02-02 DIAGNOSIS — R918 Other nonspecific abnormal finding of lung field: Secondary | ICD-10-CM | POA: Diagnosis not present

## 2024-02-02 DIAGNOSIS — R911 Solitary pulmonary nodule: Secondary | ICD-10-CM

## 2024-02-04 DIAGNOSIS — E039 Hypothyroidism, unspecified: Secondary | ICD-10-CM | POA: Diagnosis not present

## 2024-02-04 DIAGNOSIS — F339 Major depressive disorder, recurrent, unspecified: Secondary | ICD-10-CM | POA: Diagnosis not present

## 2024-02-04 DIAGNOSIS — S3289XD Fracture of other parts of pelvis, subsequent encounter for fracture with routine healing: Secondary | ICD-10-CM | POA: Diagnosis not present

## 2024-02-04 DIAGNOSIS — G629 Polyneuropathy, unspecified: Secondary | ICD-10-CM | POA: Diagnosis not present

## 2024-02-04 DIAGNOSIS — K219 Gastro-esophageal reflux disease without esophagitis: Secondary | ICD-10-CM | POA: Diagnosis not present

## 2024-02-04 DIAGNOSIS — Z8572 Personal history of non-Hodgkin lymphomas: Secondary | ICD-10-CM | POA: Diagnosis not present

## 2024-02-04 DIAGNOSIS — Z791 Long term (current) use of non-steroidal anti-inflammatories (NSAID): Secondary | ICD-10-CM | POA: Diagnosis not present

## 2024-02-04 DIAGNOSIS — N3946 Mixed incontinence: Secondary | ICD-10-CM | POA: Diagnosis not present

## 2024-02-04 DIAGNOSIS — R1310 Dysphagia, unspecified: Secondary | ICD-10-CM | POA: Diagnosis not present

## 2024-02-07 DIAGNOSIS — G629 Polyneuropathy, unspecified: Secondary | ICD-10-CM | POA: Diagnosis not present

## 2024-02-07 DIAGNOSIS — F339 Major depressive disorder, recurrent, unspecified: Secondary | ICD-10-CM | POA: Diagnosis not present

## 2024-02-07 DIAGNOSIS — R1310 Dysphagia, unspecified: Secondary | ICD-10-CM | POA: Diagnosis not present

## 2024-02-07 DIAGNOSIS — Z791 Long term (current) use of non-steroidal anti-inflammatories (NSAID): Secondary | ICD-10-CM | POA: Diagnosis not present

## 2024-02-07 DIAGNOSIS — S3289XD Fracture of other parts of pelvis, subsequent encounter for fracture with routine healing: Secondary | ICD-10-CM | POA: Diagnosis not present

## 2024-02-07 DIAGNOSIS — N3946 Mixed incontinence: Secondary | ICD-10-CM | POA: Diagnosis not present

## 2024-02-07 DIAGNOSIS — Z8572 Personal history of non-Hodgkin lymphomas: Secondary | ICD-10-CM | POA: Diagnosis not present

## 2024-02-07 DIAGNOSIS — K219 Gastro-esophageal reflux disease without esophagitis: Secondary | ICD-10-CM | POA: Diagnosis not present

## 2024-02-07 DIAGNOSIS — E039 Hypothyroidism, unspecified: Secondary | ICD-10-CM | POA: Diagnosis not present

## 2024-02-08 DIAGNOSIS — R1310 Dysphagia, unspecified: Secondary | ICD-10-CM | POA: Diagnosis not present

## 2024-02-09 DIAGNOSIS — R300 Dysuria: Secondary | ICD-10-CM | POA: Diagnosis not present

## 2024-02-11 DIAGNOSIS — Z791 Long term (current) use of non-steroidal anti-inflammatories (NSAID): Secondary | ICD-10-CM | POA: Diagnosis not present

## 2024-02-11 DIAGNOSIS — K219 Gastro-esophageal reflux disease without esophagitis: Secondary | ICD-10-CM | POA: Diagnosis not present

## 2024-02-11 DIAGNOSIS — N3946 Mixed incontinence: Secondary | ICD-10-CM | POA: Diagnosis not present

## 2024-02-11 DIAGNOSIS — S3289XD Fracture of other parts of pelvis, subsequent encounter for fracture with routine healing: Secondary | ICD-10-CM | POA: Diagnosis not present

## 2024-02-11 DIAGNOSIS — R1310 Dysphagia, unspecified: Secondary | ICD-10-CM | POA: Diagnosis not present

## 2024-02-11 DIAGNOSIS — Z8572 Personal history of non-Hodgkin lymphomas: Secondary | ICD-10-CM | POA: Diagnosis not present

## 2024-02-11 DIAGNOSIS — G629 Polyneuropathy, unspecified: Secondary | ICD-10-CM | POA: Diagnosis not present

## 2024-02-11 DIAGNOSIS — E039 Hypothyroidism, unspecified: Secondary | ICD-10-CM | POA: Diagnosis not present

## 2024-02-11 DIAGNOSIS — F339 Major depressive disorder, recurrent, unspecified: Secondary | ICD-10-CM | POA: Diagnosis not present

## 2024-02-14 DIAGNOSIS — N3946 Mixed incontinence: Secondary | ICD-10-CM | POA: Diagnosis not present

## 2024-02-18 DIAGNOSIS — G629 Polyneuropathy, unspecified: Secondary | ICD-10-CM | POA: Diagnosis not present

## 2024-02-18 DIAGNOSIS — E039 Hypothyroidism, unspecified: Secondary | ICD-10-CM | POA: Diagnosis not present

## 2024-02-18 DIAGNOSIS — Z8572 Personal history of non-Hodgkin lymphomas: Secondary | ICD-10-CM | POA: Diagnosis not present

## 2024-02-18 DIAGNOSIS — Z791 Long term (current) use of non-steroidal anti-inflammatories (NSAID): Secondary | ICD-10-CM | POA: Diagnosis not present

## 2024-02-18 DIAGNOSIS — K219 Gastro-esophageal reflux disease without esophagitis: Secondary | ICD-10-CM | POA: Diagnosis not present

## 2024-02-18 DIAGNOSIS — F339 Major depressive disorder, recurrent, unspecified: Secondary | ICD-10-CM | POA: Diagnosis not present

## 2024-02-18 DIAGNOSIS — S3289XD Fracture of other parts of pelvis, subsequent encounter for fracture with routine healing: Secondary | ICD-10-CM | POA: Diagnosis not present

## 2024-02-18 DIAGNOSIS — R1310 Dysphagia, unspecified: Secondary | ICD-10-CM | POA: Diagnosis not present

## 2024-02-18 DIAGNOSIS — N3946 Mixed incontinence: Secondary | ICD-10-CM | POA: Diagnosis not present

## 2024-02-21 DIAGNOSIS — Z8572 Personal history of non-Hodgkin lymphomas: Secondary | ICD-10-CM | POA: Diagnosis not present

## 2024-02-22 DIAGNOSIS — N390 Urinary tract infection, site not specified: Secondary | ICD-10-CM | POA: Diagnosis not present

## 2024-02-22 DIAGNOSIS — R1310 Dysphagia, unspecified: Secondary | ICD-10-CM | POA: Diagnosis not present

## 2024-02-22 DIAGNOSIS — F331 Major depressive disorder, recurrent, moderate: Secondary | ICD-10-CM | POA: Diagnosis not present

## 2024-02-22 DIAGNOSIS — E039 Hypothyroidism, unspecified: Secondary | ICD-10-CM | POA: Diagnosis not present

## 2024-02-22 DIAGNOSIS — N3946 Mixed incontinence: Secondary | ICD-10-CM | POA: Diagnosis not present

## 2024-02-22 DIAGNOSIS — S81812D Laceration without foreign body, left lower leg, subsequent encounter: Secondary | ICD-10-CM | POA: Diagnosis not present

## 2024-02-22 DIAGNOSIS — Z791 Long term (current) use of non-steroidal anti-inflammatories (NSAID): Secondary | ICD-10-CM | POA: Diagnosis not present

## 2024-02-22 DIAGNOSIS — S3289XD Fracture of other parts of pelvis, subsequent encounter for fracture with routine healing: Secondary | ICD-10-CM | POA: Diagnosis not present

## 2024-02-22 DIAGNOSIS — K219 Gastro-esophageal reflux disease without esophagitis: Secondary | ICD-10-CM | POA: Diagnosis not present

## 2024-02-22 DIAGNOSIS — Z8572 Personal history of non-Hodgkin lymphomas: Secondary | ICD-10-CM | POA: Diagnosis not present

## 2024-02-22 DIAGNOSIS — F339 Major depressive disorder, recurrent, unspecified: Secondary | ICD-10-CM | POA: Diagnosis not present

## 2024-02-22 DIAGNOSIS — G629 Polyneuropathy, unspecified: Secondary | ICD-10-CM | POA: Diagnosis not present

## 2024-02-23 ENCOUNTER — Encounter (HOSPITAL_BASED_OUTPATIENT_CLINIC_OR_DEPARTMENT_OTHER): Attending: Internal Medicine | Admitting: Internal Medicine

## 2024-02-23 DIAGNOSIS — T798XXA Other early complications of trauma, initial encounter: Secondary | ICD-10-CM | POA: Diagnosis not present

## 2024-02-23 DIAGNOSIS — L97822 Non-pressure chronic ulcer of other part of left lower leg with fat layer exposed: Secondary | ICD-10-CM | POA: Diagnosis not present

## 2024-02-23 DIAGNOSIS — I87312 Chronic venous hypertension (idiopathic) with ulcer of left lower extremity: Secondary | ICD-10-CM | POA: Diagnosis not present

## 2024-02-23 DIAGNOSIS — Z8572 Personal history of non-Hodgkin lymphomas: Secondary | ICD-10-CM | POA: Diagnosis not present

## 2024-02-24 ENCOUNTER — Ambulatory Visit

## 2024-02-24 VITALS — BP 110/75 | HR 66 | Temp 97.4°F | Ht 64.0 in | Wt 121.4 lb

## 2024-02-24 DIAGNOSIS — C8339 Primary central nervous system lymphoma: Secondary | ICD-10-CM

## 2024-02-24 DIAGNOSIS — R918 Other nonspecific abnormal finding of lung field: Secondary | ICD-10-CM

## 2024-02-24 DIAGNOSIS — Z87891 Personal history of nicotine dependence: Secondary | ICD-10-CM

## 2024-02-24 NOTE — Progress Notes (Signed)
 New Patient Pulmonology Office Visit   Subjective:  Patient ID: Daisy Oliver, female    DOB: 03/24/1936  MRN: [id number]  Referred by: Clarice Nottingham, MD  CC:  Chief Complaint  Patient presents with   Consult    Lung nodules on CT    HPI Daisy Oliver is a 88 y.o. female with pulmonary nodules who is referred to this clinic for follow up. She has Hx CNS Lymphoma- RT brain cancer 2023   Discussed the use of AI scribe software for clinical note transcription with the patient, who gave verbal consent to proceed.  History of Present Illness Daisy Oliver is an 88 year old female with CNS lymphoma who presents for evaluation of lung nodules. She was referred by Dr. Clarice for evaluation of lung nodules found after a fall.  She has a history of CNS lymphoma diagnosed in May 2023, treated with chemotherapy and radiation. The lymphoma was confined to her brain, causing balance issues and frequent falls prior to diagnosis. Multiple scans, including a PET CT in June 2023, have been conducted to monitor for disease spread.  In October 2025, she experienced a fall that led to the discovery of small lung nodules during a scan. She has a persistent cough attributed to nasal drainage. No shortness of breath, wheezing, or other respiratory symptoms. She has a history of smoking for 22 years, having quit in 1985 before moving from [address], [place] , to [place].  She resides on [address] in [place] with 24-hour caregivers. Her two children live out of state and in [place], respectively. Due to difficulty walking and performing household tasks, she uses various mobility aids, including walkers and a transport chair. Walking, such as grocery shopping, exhausts her, and she requires assistance with daily activities.  There is no family history of lung cancer or other cancers, except for her own history of brain lymphoma. She continues to undergo regular MRIs to monitor  her condition.       ROS Review of symptoms negative except mentioned above   Allergies: Barbiturates, Other, Pentothal [thiopental], and Sulfa antibiotics  Current Outpatient Medications:    Ascorbic Acid (VITAMIN C PO), Take 1 tablet by mouth daily., Disp: , Rfl:    bacitracin  ointment, Apply 1 Application topically daily., Disp: 120 g, Rfl: 0   BIOTIN PO, Take 1 tablet by mouth 2 (two) times daily., Disp: , Rfl:    celecoxib (CELEBREX) 100 MG capsule, Take 100 mg by mouth 2 (two) times daily as needed., Disp: , Rfl:    famotidine  (PEPCID ) 20 MG tablet, Take 20 mg by mouth at bedtime as needed for indigestion., Disp: , Rfl:    gabapentin  (NEURONTIN ) 100 MG capsule, TAKE 1 CAPSULE BY MOUTH TWICE A DAY, Disp: 180 capsule, Rfl: 2   hydroxychloroquine (PLAQUENIL) 200 MG tablet, as directed Orally, Disp: , Rfl:    liothyronine  (CYTOMEL ) 5 MCG tablet, TAKE 5 MCG BY MOUTH IN THE MORNING AND AT BEDTIME., Disp: 180 tablet, Rfl: 2   MAGNESIUM  PO, Take 1 capsule by mouth 2 (two) times daily., Disp: , Rfl:    Melatonin 10 MG TABS, Take 10 mg by mouth at bedtime., Disp: , Rfl:    Menthol, Topical Analgesic, (BIOFREEZE EX), Apply 1 Application topically at bedtime as needed (leg pain)., Disp: , Rfl:    Misc Natural Products (OSTEO BI-FLEX JOINT SHIELD PO), Take 1 tablet by mouth 2 (two) times daily., Disp: , Rfl:    Multiple Vitamins-Minerals (ZINC  PO),  Take 1 tablet by mouth 2 (two) times daily., Disp: , Rfl:    MYRBETRIQ 50 MG TB24 tablet, 1 tablet Orally Once a day for 30 days, Disp: , Rfl:    olmesartan  (BENICAR ) 20 MG tablet, Take  1 tablet  Daily  for BP, Disp: 90 tablet, Rfl: 3   oxyCODONE -acetaminophen  (PERCOCET/ROXICET) 5-325 MG tablet, Take 1 tablet by mouth every 6 (six) hours as needed for severe pain (pain score 7-10)., Disp: 15 tablet, Rfl: 0   Polyethyl Glycol-Propyl Glycol (SYSTANE) 0.4-0.3 % GEL ophthalmic gel, Place 1 Application into both eyes at bedtime., Disp: , Rfl:     sertraline (ZOLOFT) 50 MG tablet, See admin instructions. 1/2 tablet for 4 days then 1 each morning Orally Once a day, Disp: , Rfl:    TAURINE  PO, Take 1 capsule by mouth daily., Disp: , Rfl:    cephALEXin  (KEFLEX ) 500 MG capsule, Take 1 capsule (500 mg total) by mouth 3 (three) times daily. (Patient not taking: Reported on 02/24/2024), Disp: 15 capsule, Rfl: 0 Past Medical History:  Diagnosis Date   Acute intermittent porphyria (HCC)    diagnosed at age 26   Cancer [facility])    CNS lymphoma (HCC)    COVID-19 08/26/2020   Fracture of radial head, right, closed 09/16/2012   GERD (gastroesophageal reflux disease)    pepcid  prn   Hypertension    Hypothyroidism    PONV (postoperative nausea and vomiting)    Past Surgical History:  Procedure Laterality Date   APPLICATION OF CRANIAL NAVIGATION Right 09/02/2021   Procedure: APPLICATION OF CRANIAL NAVIGATION;  Surgeon: Debby Dorn MATSU, MD;  [facility] OR;  Service: Neurosurgery;  Laterality: Right;   CATARACT EXTRACTION, BILATERAL  2020   Dr. Cleatus   FRAMELESS  BIOPSY WITH BRAINLAB Right 09/02/2021   Procedure: FRONTAL STEREOTACTIC BRAIN BIOPSY;  Surgeon: Debby Dorn MATSU, MD;  Location: [facility] OR;  Service: Neurosurgery;  Laterality: Right;   IR IMAGING GUIDED PORT INSERTION  09/24/2021   IR REMOVAL TUN ACCESS W/ PORT W/O FL MOD SED  05/12/2023   RADIAL HEAD ARTHROPLASTY Right 09/16/2012   Procedure: RADIAL HEAD ARTHROPLASTY;  Surgeon: Fonda SHAUNNA Olmsted, MD;  Location: [facility];  Service: Orthopedics;  Laterality: Right;  RADIAL HEAD REPLACEMENT     Family History  Problem Relation Age of Onset   Heart disease Mother    Brain cancer Father 50       Brain tumor, not cancerous   Hypertension Sister    Osteoporosis Sister    Arthritis Sister    Scoliosis Sister    Hypertension Sister    Osteoporosis Sister    Cancer Paternal Grandfather    Suicidality Paternal Grandfather    Breast cancer Neg Hx    Social History    Socioeconomic History   Marital status: Married    Spouse name: Not on file   Number of children: 2   Years of education: Not on file   Highest education level: Not on file  Occupational History   Not on file  Tobacco Use   Smoking status: Former    Current packs/day: 0.00    Average packs/day: 2.0 packs/day for 22.0 years (44.0 ttl pk-yrs)    Types: Cigarettes    Start date: 09/16/1961    Quit date: 09/17/1983    Years since quitting: 40.4   Smokeless tobacco: Never  Vaping Use   Vaping status: Never Used  Substance and Sexual Activity   Alcohol use: Not Currently  Alcohol/week: 0.0 - 1.0 standard drinks of alcohol    Comment: 2/month   Drug use: No   Sexual activity: Not Currently  Other Topics Concern   Not on file  Social History Narrative   Not on file   Social Drivers of Health   Financial Resource Strain: Not on file  Food Insecurity: No Food Insecurity (11/10/2022)   Hunger Vital Sign    Worried About Running Out of Food in the Last Year: Never true    Ran Out of Food in the Last Year: Never true  Transportation Needs: No Transportation Needs (11/10/2022)   PRAPARE - Administrator, Civil Service (Medical): No    Lack of Transportation (Non-Medical): No  Physical Activity: Sufficiently Active (09/14/2017)   Exercise Vital Sign    Days of Exercise per Week: 5 days    Minutes of Exercise per Session: 60 min  Stress: No Stress Concern Present (09/14/2017)   [employer] of Occupational Health - Occupational Stress Questionnaire    Feeling of Stress : Only a little  Social Connections: Not on file  Intimate Partner Violence: Not At Risk (11/10/2022)   Humiliation, Afraid, Rape, and Kick questionnaire    Fear of Current or Ex-Partner: No    Emotionally Abused: No    Physically Abused: No    Sexually Abused: No         Objective:  BP 110/75 (BP Location: Left Arm, Patient Position: Sitting, Cuff Size: Normal)   Pulse 66   Temp (!) 97.4 F  (36.3 C) (Oral)   Ht 5' 4 (1.626 m)   Wt 121 lb 6.4 oz (55.1 kg)   SpO2 97% Comment: RA  BMI 20.84 kg/m    Physical Exam Constitutional:      General: She is not in acute distress.    Appearance: Normal appearance.  HENT:     Mouth/Throat:     Mouth: Mucous membranes are moist.  Cardiovascular:     Rate and Rhythm: Normal rate.  Pulmonary:     Effort: No respiratory distress.     Breath sounds: No wheezing or rales.  Musculoskeletal:     Right lower leg: No edema.     Left lower leg: No edema.  Skin:    General: Skin is warm.  Neurological:     Mental Status: She is alert and oriented to person, place, and time.  Psychiatric:        Mood and Affect: Mood normal.     Diagnostic Review:    Pft     No data to display               Results RADIOLOGY  Ct chest 01/2024 new nodular density in the left upper lobe measuring 3.5 mm ,scattered peripheral reticular opacities in both lungs some atelectasis in the lung bases. There is a new 2 mm nodule in the right upper lobe, stable 2 mm nodule in the right upper lobe .  No pleural effusion or pneumothorax.  Chest xray 11/2023 Nodular opacity in the retrocardiac left lung base, measuring 1.8 cm, which may represent bronchopneumonia, atelectasis, or a newly developing lung nodule. Nonemergent chest CT should be considered for further characterization.    PET CT 09/2021 : Hypermetabolic left lower lobe pulmonary nodule measuring 1.4 x 1.4 cm on series 7, image 49 with an SUV max of 5.6, finding is new when compared with Aug 28, 2021 exam. No hypermetabolic lymph nodes seen in the chest.  CT  chest 08/2021  Mild subpleural reticulation/fibrosis in the lungs bilaterally, lower lobe predominant, favoring mild chronic interstitial lung disease.  Superimposed mild dependent atelectasis in the bilateral lower lobes.   Assessment & Plan:   Assessment & Plan Lung nodules Multiple below 5 mm nodules- the ones on  right were present on prior scans from 2023 The left upper lobe 3.5 mm nodule is new and there are few subpleural atelectatic looking areas/nodules as well Will plan for repeat CT chest in 6 months since last scan Interestingly she had a over 1 cm nodule in left lower lobe on the PET CT 2023, it had some PET uptake as well- however is not present on current scans Some smaller nodules persist without much growth as mentioned above Possible differentials include inflammatory nodules, malignancy- metasta Orders:   CT SUPER D CHEST WO CONTRAST; Future  CNS lymphoma (HCC) No evidence of metastatic disease as per the pt Followed by hem/onc- rad/onc with MRI brains Orders:   CT SUPER D CHEST WO CONTRAST; Future  Former smoker Quit 1985 No copd like symptoms      Thank you for the opportunity to take part in the care of Shakema Surita   Return in about 4 months (around 07/03/2024).   Daisy Homeyer Pleas, MD [place] [facility] Office: [phone]

## 2024-02-24 NOTE — Patient Instructions (Signed)
 It was a pleasure to see you today. Your will receive a call for your CT chest scheduling.  We will see you in this clinic in few days- weeks after the CT chest

## 2024-02-24 NOTE — Assessment & Plan Note (Signed)
 No evidence of metastatic disease as per the pt Followed by hem/onc- rad/onc with MRI brains Orders:   CT SUPER D CHEST WO CONTRAST; Future

## 2024-02-25 DIAGNOSIS — R29898 Other symptoms and signs involving the musculoskeletal system: Secondary | ICD-10-CM | POA: Diagnosis not present

## 2024-02-25 DIAGNOSIS — C8339 Primary central nervous system lymphoma: Secondary | ICD-10-CM | POA: Diagnosis not present

## 2024-02-25 DIAGNOSIS — M170 Bilateral primary osteoarthritis of knee: Secondary | ICD-10-CM | POA: Diagnosis not present

## 2024-03-02 ENCOUNTER — Ambulatory Visit (HOSPITAL_COMMUNITY)

## 2024-03-06 ENCOUNTER — Encounter (HOSPITAL_BASED_OUTPATIENT_CLINIC_OR_DEPARTMENT_OTHER): Admitting: Internal Medicine

## 2024-03-06 DIAGNOSIS — T798XXA Other early complications of trauma, initial encounter: Secondary | ICD-10-CM

## 2024-03-06 DIAGNOSIS — I87312 Chronic venous hypertension (idiopathic) with ulcer of left lower extremity: Secondary | ICD-10-CM | POA: Diagnosis not present

## 2024-03-06 DIAGNOSIS — L97822 Non-pressure chronic ulcer of other part of left lower leg with fat layer exposed: Secondary | ICD-10-CM

## 2024-03-07 ENCOUNTER — Ambulatory Visit (HOSPITAL_COMMUNITY)
Admission: RE | Admit: 2024-03-07 | Discharge: 2024-03-07 | Disposition: A | Discharge status: Home / Self Care | Source: Ambulatory Visit

## 2024-03-07 DIAGNOSIS — R918 Other nonspecific abnormal finding of lung field: Secondary | ICD-10-CM | POA: Insufficient documentation

## 2024-03-07 DIAGNOSIS — C8339 Primary central nervous system lymphoma: Secondary | ICD-10-CM | POA: Insufficient documentation

## 2024-03-07 DIAGNOSIS — I700 Atherosclerosis of aorta: Secondary | ICD-10-CM | POA: Diagnosis not present

## 2024-03-13 ENCOUNTER — Encounter (HOSPITAL_BASED_OUTPATIENT_CLINIC_OR_DEPARTMENT_OTHER): Attending: Internal Medicine | Admitting: Internal Medicine

## 2024-03-13 DIAGNOSIS — I87312 Chronic venous hypertension (idiopathic) with ulcer of left lower extremity: Secondary | ICD-10-CM | POA: Diagnosis not present

## 2024-03-13 DIAGNOSIS — T798XXA Other early complications of trauma, initial encounter: Secondary | ICD-10-CM | POA: Diagnosis not present

## 2024-03-13 DIAGNOSIS — L97822 Non-pressure chronic ulcer of other part of left lower leg with fat layer exposed: Secondary | ICD-10-CM | POA: Diagnosis not present

## 2024-03-20 ENCOUNTER — Encounter (HOSPITAL_BASED_OUTPATIENT_CLINIC_OR_DEPARTMENT_OTHER): Admitting: Internal Medicine

## 2024-03-20 DIAGNOSIS — L97822 Non-pressure chronic ulcer of other part of left lower leg with fat layer exposed: Secondary | ICD-10-CM | POA: Diagnosis not present

## 2024-03-20 DIAGNOSIS — T798XXA Other early complications of trauma, initial encounter: Secondary | ICD-10-CM | POA: Diagnosis not present

## 2024-03-20 DIAGNOSIS — I87312 Chronic venous hypertension (idiopathic) with ulcer of left lower extremity: Secondary | ICD-10-CM | POA: Diagnosis not present

## 2024-04-08 ENCOUNTER — Other Ambulatory Visit: Payer: Self-pay | Admitting: Internal Medicine

## 2024-04-10 ENCOUNTER — Encounter: Payer: Self-pay | Admitting: Internal Medicine

## 2024-04-12 ENCOUNTER — Emergency Department (HOSPITAL_COMMUNITY)

## 2024-04-12 ENCOUNTER — Encounter (HOSPITAL_COMMUNITY): Payer: Self-pay

## 2024-04-12 ENCOUNTER — Other Ambulatory Visit: Payer: Self-pay

## 2024-04-12 ENCOUNTER — Emergency Department (HOSPITAL_COMMUNITY)
Admission: EM | Admit: 2024-04-12 | Discharge: 2024-04-12 | Disposition: A | Discharge status: Home / Self Care | Source: Home / Self Care | Attending: Emergency Medicine | Admitting: Emergency Medicine

## 2024-04-12 ENCOUNTER — Encounter: Payer: Self-pay | Admitting: Internal Medicine

## 2024-04-12 DIAGNOSIS — S76011A Strain of muscle, fascia and tendon of right hip, initial encounter: Secondary | ICD-10-CM | POA: Insufficient documentation

## 2024-04-12 DIAGNOSIS — S32000A Wedge compression fracture of unspecified lumbar vertebra, initial encounter for closed fracture: Secondary | ICD-10-CM

## 2024-04-12 DIAGNOSIS — S3992XA Unspecified injury of lower back, initial encounter: Secondary | ICD-10-CM | POA: Diagnosis present

## 2024-04-12 DIAGNOSIS — J181 Lobar pneumonia, unspecified organism: Secondary | ICD-10-CM | POA: Insufficient documentation

## 2024-04-12 DIAGNOSIS — S32048A Other fracture of fourth lumbar vertebra, initial encounter for closed fracture: Secondary | ICD-10-CM | POA: Insufficient documentation

## 2024-04-12 DIAGNOSIS — W010XXA Fall on same level from slipping, tripping and stumbling without subsequent striking against object, initial encounter: Secondary | ICD-10-CM | POA: Insufficient documentation

## 2024-04-12 DIAGNOSIS — J189 Pneumonia, unspecified organism: Secondary | ICD-10-CM

## 2024-04-12 LAB — BASIC METABOLIC PANEL WITH GFR
Anion gap: 9 (ref 5–15)
BUN: 17 mg/dL (ref 8–23)
CO2: 27 mmol/L (ref 22–32)
Calcium: 9.3 mg/dL (ref 8.9–10.3)
Chloride: 97 mmol/L — ABNORMAL LOW (ref 98–111)
Creatinine, Ser: 0.67 mg/dL (ref 0.44–1.00)
GFR, Estimated: >60 mL/min
Glucose, Bld: 109 mg/dL — ABNORMAL HIGH (ref 70–99)
Potassium: 3.8 mmol/L (ref 3.5–5.1)
Sodium: 133 mmol/L — ABNORMAL LOW (ref 135–145)

## 2024-04-12 LAB — CBC WITH DIFFERENTIAL/PLATELET
Abs Immature Granulocytes: 0.03 K/uL (ref 0.00–0.07)
Basophils Absolute: 0.1 K/uL (ref 0.0–0.1)
Basophils Relative: 1 %
Eosinophils Absolute: 0.4 K/uL (ref 0.0–0.5)
Eosinophils Relative: 4 %
HCT: 34.2 % — ABNORMAL LOW (ref 36.0–46.0)
Hemoglobin: 11.4 g/dL — ABNORMAL LOW (ref 12.0–15.0)
Immature Granulocytes: 0 %
Lymphocytes Relative: 12 %
Lymphs Abs: 1.1 K/uL (ref 0.7–4.0)
MCH: 30.9 pg (ref 26.0–34.0)
MCHC: 33.3 g/dL (ref 30.0–36.0)
MCV: 92.7 fL (ref 80.0–100.0)
Monocytes Absolute: 0.9 K/uL (ref 0.1–1.0)
Monocytes Relative: 9 %
Neutro Abs: 6.6 K/uL (ref 1.7–7.7)
Neutrophils Relative %: 74 %
Platelets: 196 K/uL (ref 150–400)
RBC: 3.69 MIL/uL — ABNORMAL LOW (ref 3.87–5.11)
RDW: 13 % (ref 11.5–15.5)
WBC: 9.1 K/uL (ref 4.0–10.5)
nRBC: 0 % (ref 0.0–0.2)

## 2024-04-12 LAB — PROTIME-INR
INR: 1.1 (ref 0.8–1.2)
Prothrombin Time: 14.5 s (ref 11.4–15.2)

## 2024-04-12 LAB — TYPE AND SCREEN
ABO/RH(D): A POS
Antibody Screen: NEGATIVE

## 2024-04-12 MED ORDER — AMOXICILLIN-POT CLAVULANATE 875-125 MG PO TABS: 1.0000 | ORAL_TABLET | Freq: Two times a day (BID) | ORAL | 0 refills | Status: DC

## 2024-04-12 MED ORDER — FAMOTIDINE IN NACL 20-0.9 MG/50ML-% IV SOLN
20.0000 mg | Freq: Once | INTRAVENOUS | Status: AC
  Administered 2024-04-12: 20 mg via INTRAVENOUS
  Filled 2024-04-12: qty 50

## 2024-04-12 MED ORDER — HYDROCODONE-ACETAMINOPHEN 5-325 MG PO TABS: 1.0000 | ORAL_TABLET | Freq: Four times a day (QID) | ORAL | Status: DC | PRN

## 2024-04-12 MED ORDER — DOXYCYCLINE HYCLATE 100 MG PO TABS
100.0000 mg | ORAL_TABLET | Freq: Once | ORAL | Status: AC
  Administered 2024-04-12: 100 mg via ORAL
  Filled 2024-04-12: qty 1

## 2024-04-12 MED ORDER — DOXYCYCLINE HYCLATE 100 MG PO TABS: 100.0000 mg | ORAL_TABLET | Freq: Two times a day (BID) | ORAL | Status: DC

## 2024-04-12 MED ORDER — FENTANYL CITRATE (PF) 50 MCG/ML IJ SOSY
50.0000 ug | PREFILLED_SYRINGE | INTRAMUSCULAR | Status: DC | PRN
  Administered 2024-04-12: 50 ug via INTRAVENOUS
  Filled 2024-04-12: qty 1

## 2024-04-12 MED ORDER — DOXYCYCLINE HYCLATE 100 MG PO CAPS: 100.0000 mg | ORAL_CAPSULE | Freq: Two times a day (BID) | ORAL | 0 refills | Status: DC

## 2024-04-12 MED ORDER — OXYCODONE-ACETAMINOPHEN 5-325 MG PO TABS: 1.0000 | ORAL_TABLET | Freq: Four times a day (QID) | ORAL | 0 refills | Status: AC | PRN

## 2024-04-12 MED ORDER — AMOXICILLIN-POT CLAVULANATE 875-125 MG PO TABS: 1.0000 | ORAL_TABLET | Freq: Two times a day (BID) | ORAL | Status: DC

## 2024-04-12 MED ORDER — GABAPENTIN 100 MG PO CAPS: 100.0000 mg | ORAL_CAPSULE | Freq: Two times a day (BID) | ORAL | Status: DC

## 2024-04-12 MED ORDER — ONDANSETRON HCL 4 MG/2ML IJ SOLN
4.0000 mg | Freq: Once | INTRAMUSCULAR | Status: AC
  Administered 2024-04-12: 4 mg via INTRAVENOUS
  Filled 2024-04-12: qty 2

## 2024-04-12 MED ORDER — AMOXICILLIN-POT CLAVULANATE 875-125 MG PO TABS
1.0000 | ORAL_TABLET | Freq: Once | ORAL | Status: AC
  Administered 2024-04-12: 1 via ORAL
  Filled 2024-04-12: qty 1

## 2024-04-12 NOTE — ED Notes (Signed)
 [facility] has arrived pt, denies any needs at this and denies pain .

## 2024-04-12 NOTE — ED Triage Notes (Signed)
 Daisy Oliver trying to get to chair. no LOC confirmed hip fracture, 2/10 hip pain when moving. No blood thinners. Pt reports coughing for a few weeks. A&Ox4 walks with walker. 138/64 BP 70 HR 18 RR 97% RA CBG 148

## 2024-04-12 NOTE — Progress Notes (Addendum)
 Discussed the pt with the MD. MD will order a PT consult to evaluate for recommendations. The patient is interested in rehab. ICM to follow.  ADDEN 7:00pm  CSW spoke with the pts son, Zachary, to discuss the recommendation for SNF placement. The pts son stated he would prefer the pt return to her assisted living facility, as she was recently placed there. He reported that he and his sister live out of town and requested that CSW speak with the facility. CSW attempted to contact [facility] and is awaiting a return call.  8:00 PM:  CSW spoke with Therisa Berg at [facility], who reported that the facility can meet the pts needs and that the pt may return to the ALF. She stated the pt will receive PT through their contracted agency, [facility]. She requested that home health orders and the AVS be emailed to her.   CSW spoke with the pts son, who agreed with the discharge plan and requested EMS transport back to the facility.MD and RN made aware. ICM sign off.   Tawni HERO.Eustolia Drennen, MSW, LCSW [facility]  Clinical Social Worker  Direct Dial: [phone]  Fax: [phone] [email or web address]

## 2024-04-12 NOTE — ED Provider Notes (Signed)
 " [place] EMERGENCY DEPARTMENT AT [facility] Provider Note   CSN: [id number] Arrival date & time: 04/12/24  1022     Patient presents with: Daisy Oliver is a 88 y.o. female.   HPI 88 year old female presents with a hip fracture. She was trying to get out of bed to go to the bathroom and slipped while trying to sit down in a chair, landing on her right buttocks.  This was a few nights ago.  Since then she has significant pain with walking.  X-rays were obtained at her nursing facility and then were confirmed to have a hip fracture today on report.  She was sent in by EMS.  She denies hitting her head or losing consciousness.  No weakness or numbness associated with the extremity.  She does not take any blood thinners.  She does have some low back pain as well.  Family arrived later and endorse that the patient has been having a cough for 1-2 weeks.  Patient denies fevers or shortness of breath or chest pain.  Prior to Admission medications  Medication Sig Start Date End Date Taking? Authorizing Provider  Ascorbic Acid (VITAMIN C PO) Take 1 tablet by mouth daily.    [provider]  bacitracin  ointment Apply 1 Application topically daily. 10/07/23   Kehrli, Kelsey F, PA-C  BIOTIN PO Take 1 tablet by mouth 2 (two) times daily.    [provider]  celecoxib (CELEBREX) 100 MG capsule Take 100 mg by mouth 2 (two) times daily as needed.    [provider]  cephALEXin  (KEFLEX ) 500 MG capsule Take 1 capsule (500 mg total) by mouth 3 (three) times daily. Patient not taking: Reported on 02/24/2024 12/28/23   Ruthell Lonni FALCON, PA-C  famotidine  (PEPCID ) 20 MG tablet Take 20 mg by mouth at bedtime as needed for indigestion.    [provider]  gabapentin  (NEURONTIN ) 100 MG capsule TAKE 1 CAPSULE BY MOUTH TWICE A DAY 04/10/24   Vaslow, Zachary K, MD  hydroxychloroquine (PLAQUENIL) 200 MG tablet as directed Orally    [provider]  liothyronine  (CYTOMEL ) 5 MCG tablet TAKE 5 MCG BY MOUTH IN THE MORNING AND AT BEDTIME. 01/28/23   Wilkinson, Dana E, NP  MAGNESIUM  PO Take 1 capsule by mouth 2 (two) times daily.    [provider]  Melatonin 10 MG TABS Take 10 mg by mouth at bedtime.    [provider]  Menthol, Topical Analgesic, (BIOFREEZE EX) Apply 1 Application topically at bedtime as needed (leg pain).    [provider]  Misc Natural Products (OSTEO BI-FLEX JOINT SHIELD PO) Take 1 tablet by mouth 2 (two) times daily.    [provider]  Multiple Vitamins-Minerals (ZINC  PO) Take 1 tablet by mouth 2 (two) times daily.    [provider]  MYRBETRIQ 50 MG TB24 tablet 1 tablet Orally Once a day for 30 days 11/05/22   [provider]  olmesartan  (BENICAR ) 20 MG tablet Take  1 tablet  Daily  for BP 07/30/22   Tonita Fallow, MD  oxyCODONE -acetaminophen  (PERCOCET/ROXICET) 5-325 MG tablet Take 1 tablet by mouth every 6 (six) hours as needed for severe pain (pain score 7-10). 12/02/23   Midge Golas, MD  Polyethyl Glycol-Propyl Glycol (SYSTANE) 0.4-0.3 % GEL ophthalmic gel Place 1 Application into both eyes at bedtime.    [provider]  sertraline (ZOLOFT) 50 MG tablet See admin instructions. 1/2 tablet for  4 days then 1 each morning Orally Once a day 11/11/23   [provider]  TAURINE  PO Take 1 capsule by mouth daily.    [provider]    Allergies: Barbiturates, Other, Pentothal [thiopental], and Sulfa antibiotics    Review of Systems  Musculoskeletal:  Positive for arthralgias and back pain.  Neurological:  Negative for weakness, numbness and headaches.    Updated Vital Signs BP (!) 107/58   Pulse 70   Temp 98.4 F (36.9 C) (Oral)   Resp 16   Ht 5' 4 (1.626 m)   Wt 55.1 kg   SpO2 96%   BMI 20.85 kg/m   Physical Exam Vitals and nursing note reviewed.  Constitutional:      Appearance: She is well-developed.  HENT:      Head: Normocephalic and atraumatic.  Cardiovascular:     Rate and Rhythm: Normal rate and regular rhythm.     Pulses:          Dorsalis pedis pulses are 2+ on the right side.  Pulmonary:     Effort: Pulmonary effort is normal.  Abdominal:     Palpations: Abdomen is soft.     Tenderness: There is no abdominal tenderness.  Musculoskeletal:     Thoracic back: No tenderness.     Lumbar back: Tenderness present.     Right hip: Tenderness (posterior) present. No deformity. Normal range of motion.     Right knee: Normal range of motion.     Comments: Grossly normal sensation and movement in right hip  Skin:    General: Skin is warm and dry.  Neurological:     Mental Status: She is alert.     (all labs ordered are listed, but only abnormal results are displayed) Labs Reviewed  BASIC METABOLIC PANEL WITH GFR  CBC WITH DIFFERENTIAL/PLATELET  PROTIME-INR  TYPE AND SCREEN    EKG: None  Radiology: No results found.   Procedures   Medications Ordered in the ED  fentaNYL  (SUBLIMAZE ) injection 50 mcg (has no administration in time range)                                    Medical Decision Making Amount and/or Complexity of Data Reviewed Independent Historian: caregiver Labs: ordered.    Details: Normal WBC Radiology: ordered and independent interpretation performed.    Details: Lumbar compression fractures  Risk Prescription drug management.   Patient is found to have lumbar compression fractures at L3 and L4.  Initial hip x-ray is negative.  Will get a CT of her hip.  Family is also noting a subacute cough for 2 weeks.  Chest x-ray is concerning for a pneumonia.  Will treat with antibiotics.  Right now her pain seems to be relatively well-controlled.  Will order an LSO.  She is not having any increased work of breathing, hypoxia, or abnormal vital signs.  WBC is normal.  CT hip is pending.  Care transferred to Dr. Lynder.     Final diagnoses:  None    ED Discharge  Orders     None          Freddi Hamilton, MD 04/12/24 1519  "

## 2024-04-12 NOTE — Progress Notes (Signed)
 Orthopedic Tech Progress Note Patient Details:  Daisy Oliver 03/02/1936 [id number]  Patient ID: Daisy Oliver, female   DOB: [birth year], 88 y.o.   MRN: [id number]  Daisy Oliver 04/12/2024, 3:03 PM LSO ordered from [facility].

## 2024-04-12 NOTE — ED Provider Notes (Signed)
" °  Physical Exam  BP 127/61 (BP Location: Right Arm)   Pulse 66   Temp 98.4 F (36.9 C) (Oral)   Resp 17   Ht 5' 4 (1.626 m)   Wt 55.1 kg   SpO2 95%   BMI 20.85 kg/m   Physical Exam  Procedures  Procedures  ED Course / MDM    Medical Decision Making Care assumed at 3 PM.  Patient is here with a fall.  She has a lumbar fracture and also questionable hip fracture on x-ray.  Signed out pending CT of the right hip.  Patient was ordered antibiotics for pneumonia  5 pm CT showed no right hip fracture.  Patient was ordered doxycycline  and Augmentin.  White blood cell count is normal.  LSO brace ordered.  Patient is from [facility] which does not have a nursing facility.  I discussed with social work who recommend PT evaluation and potential rehab facility.  Patient is ordered pain medicine and antibiotics.  7:05 PM Physical therapy recommended rehab facility.  Discussed with son who contacted [facility]  and they apparently have another rehab facility they work with.  I discussed with social worker  8:04 PM Social work was able to reach the son and also the facility.   the facility felt that patient can benefit from home health aide and physical therapy and is able to manage her there.  Will start on Vicodin as needed for pain and Zofran  as needed for nausea and also doxycycline  and Augmentin for pneumonia.  I have updated patient and friend at bedside  Problems Addressed: Community acquired pneumonia of left lower lobe of lung: acute illness or injury Compression fracture of lumbar vertebra, unspecified lumbar vertebral level, initial encounter [facility]): acute illness or injury Strain of right hip, initial encounter: acute illness or injury  Amount and/or Complexity of Data Reviewed Labs: ordered. Decision-making details documented in ED Course. Radiology: ordered and independent interpretation performed. Decision-making details documented in ED Course.  Risk Prescription drug  management.          Patt Alm Macho, MD 04/12/24 2010  "

## 2024-04-12 NOTE — Discharge Instructions (Signed)
 You have several issues going on.  First you have pneumonia on your x-ray.  I have prescribed Augmentin 875/125 mg twice daily for a week.  I have also prescribed doxycycline  100 milligrams twice daily for a week  You do not have a hip fracture but you do have a lumbar fracture.  I recommend you wear the lumbar brace during the day but you may remove it at night  I have prescribed 1 tablet of Percocet 5/325 mg every 6 hours as needed and have prescribed 10 tablets  Please follow-up with spine doctor  I have ordered home health with physical therapy and an aide and social work will follow-up with you  Please follow-up with your primary care doctor  Return to ER if you have worsening pain or unable to walk or fever or trouble breathing

## 2024-04-12 NOTE — Evaluation (Signed)
 Physical Therapy Evaluation Patient Details Name: Daisy Oliver MRN: [id number] DOB: 09-21-1935 Today's Date: 04/12/2024  History of Present Illness  Pt is 88 yo female admitted on 04/02/24 after fall a few days ago.  Initial xrays at ALF revealed R hip fx; however, repeat xray and CT of hip were negative for hip fx.  She did have ne lumbar compression fx at L3 and L4 and has been fit with LSO. Chest xray concerning for PNE. Pt with hx including but not limited to CA, CNS lymphoma, HTN  Clinical Impression  Pt admitted with above diagnosis. At baseline, pt ambulatory with RW or rollator (has needed some assistance the past 2 weeks) and recently moved to ALF.  Today, pt with generalized weakness and decreased mobility.  She needed mod to max A for transfers and ambulated short distance with assistance.  Noted initial concern for R hip fx but CT negative and pt was able to WB during session.  LSO was in place and no complaints of back pain other than with sidelying to sit. Pt well below baseline and requiring mod - max A.  Pt currently with functional limitations due to the deficits listed below (see PT Problem List). Pt will benefit from acute skilled PT to increase their independence and safety with mobility to allow discharge.  Recommend Patient will benefit from continued inpatient follow up therapy, <3 hours/day at d/c unless pt's ALF can provide 24 hr supervision and mod-max A for transfers (suspect unlikely since ALF level).          If plan is discharge home, recommend the following: A lot of help with walking and/or transfers;A lot of help with bathing/dressing/bathroom;Assistance with cooking/housework   Can travel by private vehicle   No    Equipment Recommendations None recommended by PT  Recommendations for Other Services       Functional Status Assessment Patient has had a recent decline in their functional status and demonstrates the ability to make significant improvements  in function in a reasonable and predictable amount of time.     Precautions / Restrictions Precautions Precautions: Back Precaution Booklet Issued: Yes (comment) (provided back precaution handout for comfort - needs reinforcement) Required Braces or Orthoses: Spinal Brace Spinal Brace: Lumbar corset;Applied in sitting position Restrictions Weight Bearing Restrictions Per Provider Order: No      Mobility  Bed Mobility Overal bed mobility: Needs Assistance Bed Mobility: Rolling, Sidelying to Sit, Sit to Sidelying Rolling: Mod assist Sidelying to sit: Mod assist     Sit to sidelying: Max assist General bed mobility comments: max cues for transfer technique and mod to max A    Transfers Overall transfer level: Needs assistance Equipment used: Rolling walker (2 wheels) Transfers: Sit to/from Stand Sit to Stand: Mod assist           General transfer comment: From elevated stretcher and required cues to get L foot under then therapist blocked    Ambulation/Gait Ambulation/Gait assistance: Mod assist Gait Distance (Feet): 6 Feet Assistive device: Rolling walker (2 wheels) Gait Pattern/deviations: Step-to pattern, Decreased stride length, Shuffle, Trunk flexed Gait velocity: decreased     General Gait Details: Assist for balance and RW  Stairs            Wheelchair Mobility     Tilt Bed    Modified Rankin (Stroke Patients Only)       Balance Overall balance assessment: Needs assistance Sitting-balance support: No upper extremity supported Sitting balance-Leahy Scale: Fair  Standing balance support: Bilateral upper extremity supported Standing balance-Leahy Scale: Poor                               Pertinent Vitals/Pain Pain Assessment Pain Assessment: Faces Faces Pain Scale: Hurts little more Pain Location: R buttock and back with sidelying to sit Pain Descriptors / Indicators: Discomfort Pain Intervention(s): Limited activity  within patient's tolerance, Monitored during session    Home Living Family/patient expects to be discharged to:: Assisted living                 Home Equipment: [employer] (2 wheels);Rollator (4 wheels) Additional Comments: Pt recently (last week) moved into ALF at [facility].  She had previously been at home with 24 hr caregivers.  Does not have 24 hr support at ALF.    Prior Function Prior Level of Function : Needs assist             Mobility Comments: Caregiver provided hx (in ED pt easily distracted).  Reports she could ambulate in home with RW or rollator up until the past 2 weeks.  Recently, still ambulating but needing light assist. ADLs Comments: Up until past couple of weeks - pt only needing light assist/supervision for ADLs     Extremity/Trunk Assessment   Upper Extremity Assessment Upper Extremity Assessment: Generalized weakness    Lower Extremity Assessment Lower Extremity Assessment: Generalized weakness (Difficult to fully assess on edge of stretcher in ED hallway, but ROM is [facility].  Pt does have some deficits on L LE baseline compared to R LE but strength appears at least 3/5.)    Cervical / Trunk Assessment Cervical / Trunk Assessment: Kyphotic;Other exceptions Cervical / Trunk Exceptions: LSO in place; utilized back prec for comfort  Communication        Cognition Arousal: Alert Behavior During Therapy: Flat affect   PT - Cognitive impairments: Difficult to assess, Awareness, Attention, Sequencing, Problem solving, Safety/Judgement, History of cognitive impairments                       PT - Cognition Comments: Pt in busy hallway ED and easily distracted.  Pt soft spoken in loud environment.  Difficult to assess but pt did follow comamnds with increased time and multimodal cues.  Caregiver reports deficits baseline due to brain tumor and touch of dementia         Cueing       General Comments      Exercises      Assessment/Plan    PT Assessment Patient needs continued PT services  PT Problem List Decreased strength;Decreased mobility;Decreased activity tolerance;Decreased balance;Decreased knowledge of use of DME;Decreased knowledge of precautions;Decreased safety awareness;Decreased cognition       PT Treatment Interventions DME instruction;Therapeutic exercise;Gait training;Functional mobility training;Therapeutic activities;Patient/family education;Balance training    PT Goals (Current goals can be found in the Care Plan section)  Acute Rehab PT Goals Patient Stated Goal: Caregiver reports would prefer return to ALF but not sure they can provide support PT Goal Formulation: With patient/family Time For Goal Achievement: 04/26/24 Potential to Achieve Goals: Good    Frequency Min 2X/week     Co-evaluation               AM-PAC PT 6 Clicks Mobility  Outcome Measure Help needed turning from your back to your side while in a flat bed without using bedrails?: Total Help needed moving  from lying on your back to sitting on the side of a flat bed without using bedrails?: Total Help needed moving to and from a bed to a chair (including a wheelchair)?: A Lot Help needed standing up from a chair using your arms (e.g., wheelchair or bedside chair)?: A Lot Help needed to walk in hospital room?: Total Help needed climbing 3-5 steps with a railing? : Total 6 Click Score: 8    End of Session Equipment Utilized During Treatment: Back brace Activity Tolerance: Patient limited by pain;Patient limited by fatigue Patient left: with family/visitor present (stretcher in ED hallway) Nurse Communication: Mobility status PT Visit Diagnosis: Other abnormalities of gait and mobility (R26.89);Pain;Muscle weakness (generalized) (M62.81) Pain - part of body:  (back)    Time: 1700-1730 PT Time Calculation (min) (ACUTE ONLY): 30 min   Charges:   PT Evaluation $PT Eval Low Complexity: 1 Low PT  Treatments $Therapeutic Activity: 8-22 mins PT General Charges $$ ACUTE PT VISIT: 1 Visit         Benjiman, PT Acute Rehab [facility] [phone]   Benjiman VEAR Mulberry 04/12/2024, 5:53 PM

## 2024-04-20 ENCOUNTER — Encounter: Payer: Self-pay | Admitting: *Deleted

## 2024-04-20 ENCOUNTER — Ambulatory Visit (HOSPITAL_COMMUNITY): Admission: RE | Admit: 2024-04-20 | Source: Ambulatory Visit

## 2024-04-27 ENCOUNTER — Ambulatory Visit: Admitting: Internal Medicine

## 2024-04-27 ENCOUNTER — Telehealth: Payer: Self-pay | Admitting: *Deleted

## 2024-04-27 NOTE — Telephone Encounter (Signed)
 PC to patient, no answer, no VM available.  PC to her son Zachary, no answer, left VM - informed him she missed her recent brain MRI & that this needs to be rescheduled by [employer], phone number given.  Also informed him she will need a F/U visit scheduled with Dr Buckley after the MRI to review results, our scheduling number given for this as well.

## 2024-04-27 NOTE — Telephone Encounter (Signed)
 PC received from patient's son Daisy Oliver - he states that she is now a resident of [facility] [place].  The coordinator there is responsible for scheduling her appointments & seeing that she has transportation here.  He is contacting them today so they get her rescheduled.

## 2024-05-01 ENCOUNTER — Telehealth: Payer: Self-pay | Admitting: *Deleted

## 2024-05-01 ENCOUNTER — Encounter: Payer: Self-pay | Admitting: Internal Medicine

## 2024-05-01 NOTE — Telephone Encounter (Signed)
 PC to [facility], spoke with Porsche - informed her patient has MRI brain ordered which may be scheduled by [employer], number given.  She will need F/U appointment with Dr Buckley the following week to discuss MRI results, scheduling number at [facility] given.  Porsche verbalizes understanding.

## 2024-05-03 ENCOUNTER — Encounter: Payer: Self-pay | Admitting: Internal Medicine

## 2024-05-09 ENCOUNTER — Ambulatory Visit (HOSPITAL_COMMUNITY)
Admission: RE | Admit: 2024-05-09 | Discharge: 2024-05-09 | Disposition: A | Discharge status: Home / Self Care | Source: Ambulatory Visit | Attending: Internal Medicine | Admitting: Internal Medicine

## 2024-05-09 DIAGNOSIS — C8339 Primary central nervous system lymphoma: Secondary | ICD-10-CM | POA: Diagnosis present

## 2024-05-09 MED ORDER — GADOBUTROL 1 MMOL/ML IV SOLN
5.0000 mL | Freq: Once | INTRAVENOUS | Status: AC | PRN
  Administered 2024-05-09: 5 mL via INTRAVENOUS

## 2024-05-15 ENCOUNTER — Emergency Department (HOSPITAL_COMMUNITY)

## 2024-05-15 ENCOUNTER — Other Ambulatory Visit: Payer: Self-pay

## 2024-05-15 ENCOUNTER — Observation Stay (HOSPITAL_COMMUNITY)
Admission: EM | Admit: 2024-05-15 | Discharge: 2024-05-17 | Disposition: A | Attending: Emergency Medicine | Admitting: Emergency Medicine

## 2024-05-15 ENCOUNTER — Encounter (HOSPITAL_COMMUNITY): Payer: Self-pay | Admitting: *Deleted

## 2024-05-15 DIAGNOSIS — M2669 Other specified disorders of temporomandibular joint: Secondary | ICD-10-CM | POA: Insufficient documentation

## 2024-05-15 DIAGNOSIS — M8580 Other specified disorders of bone density and structure, unspecified site: Secondary | ICD-10-CM | POA: Insufficient documentation

## 2024-05-15 DIAGNOSIS — R55 Syncope and collapse: Principal | ICD-10-CM | POA: Diagnosis present

## 2024-05-15 DIAGNOSIS — W19XXXA Unspecified fall, initial encounter: Principal | ICD-10-CM | POA: Insufficient documentation

## 2024-05-15 DIAGNOSIS — E039 Hypothyroidism, unspecified: Secondary | ICD-10-CM | POA: Insufficient documentation

## 2024-05-15 DIAGNOSIS — I10 Essential (primary) hypertension: Secondary | ICD-10-CM | POA: Insufficient documentation

## 2024-05-15 DIAGNOSIS — S01511A Laceration without foreign body of lip, initial encounter: Secondary | ICD-10-CM | POA: Insufficient documentation

## 2024-05-15 DIAGNOSIS — G9389 Other specified disorders of brain: Secondary | ICD-10-CM | POA: Insufficient documentation

## 2024-05-15 DIAGNOSIS — K5730 Diverticulosis of large intestine without perforation or abscess without bleeding: Secondary | ICD-10-CM | POA: Insufficient documentation

## 2024-05-15 DIAGNOSIS — Z87891 Personal history of nicotine dependence: Secondary | ICD-10-CM | POA: Insufficient documentation

## 2024-05-15 LAB — CBC
HCT: 37.7 % (ref 36.0–46.0)
Hemoglobin: 12.6 g/dL (ref 12.0–15.0)
MCH: 31.3 pg (ref 26.0–34.0)
MCHC: 33.4 g/dL (ref 30.0–36.0)
MCV: 93.8 fL (ref 80.0–100.0)
Platelets: 178 10*3/uL (ref 150–400)
RBC: 4.02 MIL/uL (ref 3.87–5.11)
RDW: 13.4 % (ref 11.5–15.5)
WBC: 13.6 10*3/uL — ABNORMAL HIGH (ref 4.0–10.5)
nRBC: 0 % (ref 0.0–0.2)

## 2024-05-15 LAB — I-STAT CHEM 8, ED
BUN: 22 mg/dL (ref 8–23)
Calcium, Ion: 1.21 mmol/L (ref 1.15–1.40)
Chloride: 96 mmol/L — ABNORMAL LOW (ref 98–111)
Creatinine, Ser: 0.7 mg/dL (ref 0.44–1.00)
Glucose, Bld: 127 mg/dL — ABNORMAL HIGH (ref 70–99)
HCT: 39 % (ref 36.0–46.0)
Hemoglobin: 13.3 g/dL (ref 12.0–15.0)
Potassium: 3.7 mmol/L (ref 3.5–5.1)
Sodium: 137 mmol/L (ref 135–145)
TCO2: 29 mmol/L (ref 22–32)

## 2024-05-15 LAB — PROTIME-INR
INR: 1.1 (ref 0.8–1.2)
Prothrombin Time: 15.2 s (ref 11.4–15.2)

## 2024-05-15 LAB — I-STAT CG4 LACTIC ACID, ED: Lactic Acid, Venous: 1.8 mmol/L (ref 0.5–1.9)

## 2024-05-15 MED ORDER — ONDANSETRON HCL 4 MG/2ML IJ SOLN
4.0000 mg | Freq: Four times a day (QID) | INTRAMUSCULAR | Status: DC | PRN
  Administered 2024-05-16: 4 mg via INTRAVENOUS
  Filled 2024-05-15: qty 2

## 2024-05-15 MED ORDER — LACTATED RINGERS IV BOLUS
500.0000 mL | Freq: Once | INTRAVENOUS | Status: AC
  Administered 2024-05-16: 500 mL via INTRAVENOUS

## 2024-05-15 MED ORDER — FENTANYL CITRATE (PF) 50 MCG/ML IJ SOSY
50.0000 ug | PREFILLED_SYRINGE | INTRAMUSCULAR | Status: DC | PRN
  Administered 2024-05-16: 50 ug via INTRAVENOUS
  Filled 2024-05-15: qty 1

## 2024-05-15 NOTE — ED Provider Notes (Signed)
 " [place] EMERGENCY DEPARTMENT AT [facility] Provider Note   CSN: [id number] Arrival date & time: 05/15/24  2324     Patient presents with: Daisy Oliver Daisy Oliver is a 89 y.o. female.  {Add pertinent medical, surgical, social history, OB history to YEP:67052}  Fall  Patient presents after unwitnessed fall.  Medical history includes CNS lymphoma, HTN, GERD, migraines, HLD, CKD, anemia.  She is followed by Dr. Buckley.  Her CNS lymphoma was diagnosed 3 years ago.  She underwent chemotherapy and radiation therapy in 2023.  She was found facedown at her facility with presence of vomitus.  She does not recall the fall.  She endorses acute right shoulder pain as well as chronic pain in her lower extremities.  She states that her left leg is chronically swollen and discolored and has actually had some improvement lately.     Prior to Admission medications  Medication Sig Start Date End Date Taking? Authorizing Provider  amoxicillin -clavulanate (AUGMENTIN ) 875-125 MG tablet Take 1 tablet by mouth every 12 (twelve) hours. 04/12/24   Patt Alm Macho, MD  Ascorbic Acid (VITAMIN C PO) Take 1 tablet by mouth daily.    [provider]  bacitracin  ointment Apply 1 Application topically daily. 10/07/23   Kehrli, Kelsey F, PA-C  BIOTIN PO Take 1 tablet by mouth 2 (two) times daily.    [provider]  celecoxib  (CELEBREX ) 100 MG capsule Take 100 mg by mouth 2 (two) times daily as needed.    [provider]  cephALEXin  (KEFLEX ) 500 MG capsule Take 1 capsule (500 mg total) by mouth 3 (three) times daily. Patient not taking: Reported on 02/24/2024 12/28/23   Ruthell Lonni FALCON, PA-C  doxycycline  (VIBRAMYCIN ) 100 MG capsule Take 1 capsule (100 mg total) by mouth 2 (two) times daily. One po bid x 7 days 04/12/24   Patt Alm Macho, MD  famotidine  (PEPCID ) 20 MG tablet Take 20 mg by mouth at bedtime as needed for indigestion.    [provider]   gabapentin  (NEURONTIN ) 100 MG capsule TAKE 1 CAPSULE BY MOUTH TWICE A DAY 04/10/24   Vaslow, Zachary K, MD  hydroxychloroquine  (PLAQUENIL ) 200 MG tablet as directed Orally    [provider]  liothyronine  (CYTOMEL ) 5 MCG tablet TAKE 5 MCG BY MOUTH IN THE MORNING AND AT BEDTIME. 01/28/23   Wilkinson, Dana E, NP  MAGNESIUM  PO Take 1 capsule by mouth 2 (two) times daily.    [provider]  Melatonin 10 MG TABS Take 10 mg by mouth at bedtime.    [provider]  Menthol, Topical Analgesic, (BIOFREEZE EX) Apply 1 Application topically at bedtime as needed (leg pain).    [provider]  Misc Natural Products (OSTEO BI-FLEX JOINT SHIELD PO) Take 1 tablet by mouth 2 (two) times daily.    [provider]  Multiple Vitamins-Minerals (ZINC  PO) Take 1 tablet by mouth 2 (two) times daily.    [provider]  MYRBETRIQ  50 MG TB24 tablet 1 tablet Orally Once a day for 30 days 11/05/22   [provider]  olmesartan  (BENICAR ) 20 MG tablet Take  1 tablet  Daily  for BP 07/30/22   Tonita Fallow, MD  oxyCODONE -acetaminophen  (PERCOCET) 5-325 MG tablet Take 1 tablet by mouth every 6 (six) hours as needed. 04/12/24   Patt Alm Macho, MD  Polyethyl Glycol-Propyl Glycol (SYSTANE) 0.4-0.3 % GEL ophthalmic gel Place 1 Application into both eyes at bedtime.    [provider]  sertraline (ZOLOFT) 50 MG tablet See admin instructions. 1/2 tablet for 4 days then 1 each morning Orally Once a day 11/11/23   [provider]  TAURINE  PO Take 1 capsule by mouth daily.    [provider]    Allergies: Barbiturates, Other, Pentothal [thiopental], and Sulfa antibiotics    Review of Systems  Gastrointestinal:  Positive for vomiting.  Musculoskeletal:  Positive for arthralgias and myalgias.  Neurological:  Positive for syncope.  All other systems reviewed and are negative.   Updated Vital Signs There were no vitals taken for this  visit.  Physical Exam Vitals and nursing note reviewed.  Constitutional:      General: She is not in acute distress.    Appearance: Normal appearance. She is well-developed. She is not toxic-appearing or diaphoretic.  HENT:     Head: Normocephalic and atraumatic.     Right Ear: External ear normal.     Left Ear: External ear normal.     Nose: Nose normal.     Mouth/Throat:     Mouth: Mucous membranes are moist.  Eyes:     Extraocular Movements: Extraocular movements intact.     Conjunctiva/sclera: Conjunctivae normal.  Cardiovascular:     Rate and Rhythm: Normal rate and regular rhythm.     Heart sounds: No murmur heard. Pulmonary:     Effort: Pulmonary effort is normal. No respiratory distress.     Breath sounds: Normal breath sounds.  Chest:     Chest wall: No tenderness.  Abdominal:     General: There is no distension.     Palpations: Abdomen is soft.     Tenderness: There is no abdominal tenderness.  Musculoskeletal:        General: No deformity.     Cervical back: Normal range of motion and neck supple.     Left lower leg: Edema present.  Skin:    General: Skin is warm and dry.     Capillary Refill: Capillary refill takes less than 2 seconds.     Findings: Bruising present.  Neurological:     General: No focal deficit present.     Mental Status: She is alert and oriented to person, place, and time.  Psychiatric:        Mood and Affect: Mood normal.        Behavior: Behavior normal.     (all labs ordered are listed, but only abnormal results are displayed) Labs Reviewed - No data to display  EKG: None  Radiology: No results found.  {Document cardiac monitor, telemetry assessment procedure when appropriate:32947} Procedures   Medications Ordered in the ED - No data to display    {Click here for ABCD2, HEART and other calculators REFRESH Note before signing:1}                              Medical Decision Making Amount and/or Complexity of Data  Reviewed Labs: ordered. Radiology: ordered.  Risk Prescription drug management.   This patient presents to the ED for concern of ***, this involves an extensive number of treatment options, and is a complaint that carries with it a high risk of complications and morbidity.  The differential diagnosis includes ***   Co morbidities / Chronic conditions that complicate the patient evaluation  ***   Additional history obtained:  Additional history obtained from EMR External records from outside source obtained and reviewed including ***  Lab Tests:  I Ordered, and personally interpreted labs.  The pertinent results include:  ***   Imaging Studies ordered:  I ordered imaging studies including ***  I independently visualized and interpreted imaging which showed *** I agree with the radiologist interpretation   Cardiac Monitoring: / EKG:  The patient was maintained on a cardiac monitor.  I personally viewed and interpreted the cardiac monitored which showed an underlying rhythm of: ***   Problem List / ED Course / Critical interventions / Medication management  Patient presenting after unwitnessed fall at facility.  On arrival, she is alert and oriented.  She does not recall the fall.  She may have had a syncopal episode.  She does have a history of CNS lymphoma but denies any seizure history.  She does have an abrasion to her lower lip.  No tongue bite is present.  She endorses new pain in her right shoulder as well as chronic pain in her lower extremities.  Lower extremities are notable for significant swelling and bruising to LLE.  She states that this is a chronic condition for her.  Workup was initiated.*** I ordered medication including ***   Reevaluation of the patient after these medicines showed that the patient *** I have reviewed the patients home medicines and have made adjustments as needed   Consultations Obtained:  I requested consultation with the ***,  and  discussed lab and imaging findings as well as pertinent plan - they recommend: ***   Social Determinants of Health:  ***   Test / Admission - Considered:  ***   {Document critical care time when appropriate  Document review of labs and clinical decision tools ie CHADS2VASC2, etc  Document your independent review of radiology images and any outside records  Document your discussion with family members, caretakers and with consultants  Document social determinants of health affecting pt's care  Document your decision making why or why not admission, treatments were needed:32947:::1}   Final diagnoses:  None    ED Discharge Orders     None        "

## 2024-05-16 ENCOUNTER — Observation Stay (HOSPITAL_COMMUNITY)

## 2024-05-16 ENCOUNTER — Emergency Department (HOSPITAL_COMMUNITY)

## 2024-05-16 ENCOUNTER — Inpatient Hospital Stay: Admitting: Internal Medicine

## 2024-05-16 DIAGNOSIS — R55 Syncope and collapse: Secondary | ICD-10-CM

## 2024-05-16 DIAGNOSIS — R569 Unspecified convulsions: Secondary | ICD-10-CM

## 2024-05-16 LAB — URINALYSIS, ROUTINE W REFLEX MICROSCOPIC
Bilirubin Urine: NEGATIVE
Glucose, UA: NEGATIVE mg/dL
Hgb urine dipstick: NEGATIVE
Ketones, ur: NEGATIVE mg/dL
Leukocytes,Ua: NEGATIVE
Nitrite: NEGATIVE
Protein, ur: NEGATIVE mg/dL
Specific Gravity, Urine: 1.02 (ref 1.005–1.030)
pH: 5 (ref 5.0–8.0)

## 2024-05-16 LAB — CBC
HCT: 34.3 % — ABNORMAL LOW (ref 36.0–46.0)
Hemoglobin: 11.3 g/dL — ABNORMAL LOW (ref 12.0–15.0)
MCH: 31.1 pg (ref 26.0–34.0)
MCHC: 32.9 g/dL (ref 30.0–36.0)
MCV: 94.5 fL (ref 80.0–100.0)
Platelets: 154 10*3/uL (ref 150–400)
RBC: 3.63 MIL/uL — ABNORMAL LOW (ref 3.87–5.11)
RDW: 13.5 % (ref 11.5–15.5)
WBC: 9.1 10*3/uL (ref 4.0–10.5)
nRBC: 0 % (ref 0.0–0.2)

## 2024-05-16 LAB — BASIC METABOLIC PANEL WITH GFR
Anion gap: 9 (ref 5–15)
BUN: 18 mg/dL (ref 8–23)
CO2: 27 mmol/L (ref 22–32)
Calcium: 9 mg/dL (ref 8.9–10.3)
Chloride: 98 mmol/L (ref 98–111)
Creatinine, Ser: 0.6 mg/dL (ref 0.44–1.00)
GFR, Estimated: >60 mL/min
Glucose, Bld: 114 mg/dL — ABNORMAL HIGH (ref 70–99)
Potassium: 4.1 mmol/L (ref 3.5–5.1)
Sodium: 134 mmol/L — ABNORMAL LOW (ref 135–145)

## 2024-05-16 LAB — ECHOCARDIOGRAM COMPLETE
AR max vel: 3.77 cm2
AV Area VTI: 3.91 cm2
AV Area mean vel: 3.64 cm2
AV Mean grad: 3 mmHg
AV Peak grad: 4.7 mmHg
Ao pk vel: 1.08 m/s
Area-P 1/2: 2.79 cm2
S' Lateral: 2.5 cm

## 2024-05-16 LAB — COMPREHENSIVE METABOLIC PANEL WITH GFR
ALT: 20 U/L (ref 0–44)
AST: 39 U/L (ref 15–41)
Albumin: 4 g/dL (ref 3.5–5.0)
Alkaline Phosphatase: 90 U/L (ref 38–126)
Anion gap: 11 (ref 5–15)
BUN: 20 mg/dL (ref 8–23)
CO2: 28 mmol/L (ref 22–32)
Calcium: 9.6 mg/dL (ref 8.9–10.3)
Chloride: 97 mmol/L — ABNORMAL LOW (ref 98–111)
Creatinine, Ser: 0.66 mg/dL (ref 0.44–1.00)
GFR, Estimated: >60 mL/min
Glucose, Bld: 127 mg/dL — ABNORMAL HIGH (ref 70–99)
Potassium: 4.1 mmol/L (ref 3.5–5.1)
Sodium: 136 mmol/L (ref 135–145)
Total Bilirubin: 0.9 mg/dL (ref 0.0–1.2)
Total Protein: 7.7 g/dL (ref 6.5–8.1)

## 2024-05-16 LAB — TROPONIN T, HIGH SENSITIVITY
Troponin T High Sensitivity: 34 ng/L — ABNORMAL HIGH (ref 0–19)
Troponin T High Sensitivity: 33 ng/L — ABNORMAL HIGH (ref 0–19)

## 2024-05-16 LAB — TSH: TSH: 1.7 u[IU]/mL (ref 0.350–4.500)

## 2024-05-16 LAB — MAGNESIUM: Magnesium: 2 mg/dL (ref 1.7–2.4)

## 2024-05-16 LAB — CBG MONITORING, ED: Glucose-Capillary: 108 mg/dL — ABNORMAL HIGH (ref 70–99)

## 2024-05-16 LAB — CK: Total CK: 133 U/L (ref 38–234)

## 2024-05-16 LAB — PHOSPHORUS: Phosphorus: 3 mg/dL (ref 2.5–4.6)

## 2024-05-16 LAB — ETHANOL: Alcohol, Ethyl (B): <15 mg/dL

## 2024-05-16 MED ORDER — OXYCODONE-ACETAMINOPHEN 5-325 MG PO TABS: 1.0000 | ORAL_TABLET | Freq: Four times a day (QID) | ORAL | Status: DC | PRN

## 2024-05-16 MED ORDER — SENNOSIDES-DOCUSATE SODIUM 8.6-50 MG PO TABS: 1.0000 | ORAL_TABLET | Freq: Every evening | ORAL | Status: DC | PRN

## 2024-05-16 MED ORDER — SODIUM CHLORIDE 0.9% FLUSH: 3.0000 mL | INTRAVENOUS | Status: DC | PRN

## 2024-05-16 MED ORDER — IRBESARTAN 75 MG PO TABS: 37.5000 mg | ORAL_TABLET | Freq: Every day | ORAL | Status: DC

## 2024-05-16 MED ORDER — HYDROXYCHLOROQUINE SULFATE 200 MG PO TABS: 200.0000 mg | ORAL_TABLET | Freq: Every day | ORAL | Status: DC

## 2024-05-16 MED ORDER — LIOTHYRONINE SODIUM 5 MCG PO TABS: 5.0000 ug | ORAL_TABLET | Freq: Every day | ORAL | Status: DC

## 2024-05-16 MED ORDER — SODIUM CHLORIDE 0.9 % IV SOLN: INTRAVENOUS | Status: AC

## 2024-05-16 MED ORDER — ONDANSETRON HCL 4 MG/2ML IJ SOLN: 4.0000 mg | Freq: Four times a day (QID) | INTRAMUSCULAR | Status: DC | PRN

## 2024-05-16 MED ORDER — ONDANSETRON HCL 4 MG PO TABS: 4.0000 mg | ORAL_TABLET | Freq: Four times a day (QID) | ORAL | Status: DC | PRN

## 2024-05-16 MED ORDER — MELATONIN 5 MG PO TABS
10.0000 mg | ORAL_TABLET | Freq: Every day | ORAL | Status: DC
  Administered 2024-05-16: 10 mg via ORAL
  Filled 2024-05-16: qty 2

## 2024-05-16 MED ORDER — SODIUM CHLORIDE 0.9 % IV SOLN: 250.0000 mL | INTRAVENOUS | Status: AC | PRN

## 2024-05-16 MED ORDER — LEVETIRACETAM (KEPPRA) 500 MG/5 ML ADULT IV PUSH
500.0000 mg | Freq: Two times a day (BID) | INTRAVENOUS | Status: DC
  Administered 2024-05-16 – 2024-05-17 (×2): 500 mg via INTRAVENOUS
  Filled 2024-05-16 (×2): qty 5

## 2024-05-16 MED ORDER — LACTATED RINGERS IV BOLUS: 500.0000 mL | Freq: Once | INTRAVENOUS | Status: DC

## 2024-05-16 MED ORDER — SODIUM CHLORIDE 0.9% FLUSH
3.0000 mL | Freq: Two times a day (BID) | INTRAVENOUS | Status: DC
  Administered 2024-05-16 (×2): 3 mL via INTRAVENOUS

## 2024-05-16 MED ORDER — FAMOTIDINE 20 MG PO TABS: 20.0000 mg | ORAL_TABLET | Freq: Every evening | ORAL | Status: DC | PRN

## 2024-05-16 MED ORDER — LEVETIRACETAM (KEPPRA) 500 MG/5 ML ADULT IV PUSH
500.0000 mg | INTRAVENOUS | Status: DC
  Administered 2024-05-16: 500 mg via INTRAVENOUS
  Filled 2024-05-16: qty 5

## 2024-05-16 MED ORDER — ACETAMINOPHEN 325 MG PO TABS
650.0000 mg | ORAL_TABLET | Freq: Four times a day (QID) | ORAL | Status: DC | PRN
  Administered 2024-05-17 (×2): 650 mg via ORAL
  Filled 2024-05-16 (×2): qty 2

## 2024-05-16 MED ORDER — ACETAMINOPHEN 650 MG RE SUPP: 650.0000 mg | Freq: Four times a day (QID) | RECTAL | Status: DC | PRN

## 2024-05-16 MED ORDER — MIRABEGRON ER 50 MG PO TB24
50.0000 mg | ORAL_TABLET | Freq: Every day | ORAL | Status: DC
  Administered 2024-05-16: 50 mg via ORAL
  Filled 2024-05-16 (×2): qty 1

## 2024-05-16 MED ORDER — LIOTHYRONINE SODIUM 5 MCG PO TABS
5.0000 ug | ORAL_TABLET | Freq: Two times a day (BID) | ORAL | Status: DC
  Administered 2024-05-16 (×2): 5 ug via ORAL
  Filled 2024-05-16 (×3): qty 1

## 2024-05-16 NOTE — ED Notes (Signed)
 FLoor notified

## 2024-05-16 NOTE — H&P (Signed)
 " History and Physical    Patient: Daisy Oliver FMW:[id number] DOB: 1936/02/20 DOA: 05/15/2024 DOS: the patient was seen and examined on 05/16/2024 PCP: Clarice Nottingham, MD  Patient coming from: SNF  Chief Complaint:  Chief Complaint  Patient presents with   Fall   HPI: Daisy Oliver is a 89 y.o. female with medical history significant of CNS lymphoma, GERD, hypertension, hypothyroidism, acute intermittent porphyria.  Patient has had recent admissions on account of recurrent falls.  She presents to the emergency room this early morning on account of an unwitnessed fall at her facility.  Patient was found down on the floor laying in vomitus.  She does not recall how she fell.  Denies any preceding symptoms.  She was noted to have some laceration on the lips, no tongue bites however noted.  Patient was unable to state if she has had previous episodes of seizures.  Incidentally, patient had an MRI of the brain done recently.  There were findings of changes in the brain  Review of Systems: unable to review all systems due to the inability of the patient to answer questions. Past Medical History:  Diagnosis Date   Acute intermittent porphyria (HCC)    diagnosed at age 89   Cancer [facility])    CNS lymphoma (HCC)    COVID-19 08/26/2020   Fracture of radial head, right, closed 09/16/2012   GERD (gastroesophageal reflux disease)    pepcid  prn   Hypertension    Hypothyroidism    PONV (postoperative nausea and vomiting)    Past Surgical History:  Procedure Laterality Date   APPLICATION OF CRANIAL NAVIGATION Right 09/02/2021   Procedure: APPLICATION OF CRANIAL NAVIGATION;  Surgeon: Debby Dorn MATSU, MD;  Location: [facility] OR;  Service: Neurosurgery;  Laterality: Right;   CATARACT EXTRACTION, BILATERAL  2020   Dr. Cleatus   FRAMELESS  BIOPSY WITH BRAINLAB Right 09/02/2021   Procedure: FRONTAL STEREOTACTIC BRAIN BIOPSY;  Surgeon: Debby Dorn MATSU, MD;  Location: [facility] OR;  Service: Neurosurgery;   Laterality: Right;   IR IMAGING GUIDED PORT INSERTION  09/24/2021   IR REMOVAL TUN ACCESS W/ PORT W/O FL MOD SED  05/12/2023   RADIAL HEAD ARTHROPLASTY Right 09/16/2012   Procedure: RADIAL HEAD ARTHROPLASTY;  Surgeon: Fonda SHAUNNA Olmsted, MD;  Location: [facility];  Service: Orthopedics;  Laterality: Right;  RADIAL HEAD REPLACEMENT     Social History:  reports that she quit smoking about 40 years ago. Her smoking use included cigarettes. She started smoking about 62 years ago. She has a 44 pack-year smoking history. She has never used smokeless tobacco. She reports that she does not currently use alcohol. She reports that she does not use drugs.  Allergies[1]  Family History  Problem Relation Age of Onset   Heart disease Mother    Brain cancer Father 82       Brain tumor, not cancerous   Hypertension Sister    Osteoporosis Sister    Arthritis Sister    Scoliosis Sister    Hypertension Sister    Osteoporosis Sister    Cancer Paternal Grandfather    Suicidality Paternal Grandfather    Breast cancer Neg Hx     Prior to Admission medications  Medication Sig Start Date End Date Taking? Authorizing Provider  amoxicillin -clavulanate (AUGMENTIN ) 875-125 MG tablet Take 1 tablet by mouth every 12 (twelve) hours. 04/12/24   Patt Alm Macho, MD  Ascorbic Acid (VITAMIN C PO) Take 1 tablet by mouth daily.  [provider]  bacitracin  ointment Apply 1 Application topically daily. 10/07/23   Kehrli, Kelsey F, PA-C  BIOTIN PO Take 1 tablet by mouth 2 (two) times daily.    [provider]  celecoxib  (CELEBREX ) 100 MG capsule Take 100 mg by mouth 2 (two) times daily as needed.    [provider]  cephALEXin  (KEFLEX ) 500 MG capsule Take 1 capsule (500 mg total) by mouth 3 (three) times daily. Patient not taking: Reported on 02/24/2024 12/28/23   Ruthell Lonni FALCON, PA-C  doxycycline  (VIBRAMYCIN ) 100 MG capsule Take 1 capsule (100 mg total) by mouth 2 (two) times  daily. One po bid x 7 days 04/12/24   Patt Alm Macho, MD  famotidine  (PEPCID ) 20 MG tablet Take 20 mg by mouth at bedtime as needed for indigestion.    [provider]  gabapentin  (NEURONTIN ) 100 MG capsule TAKE 1 CAPSULE BY MOUTH TWICE A DAY 04/10/24   Vaslow, Zachary K, MD  hydroxychloroquine  (PLAQUENIL ) 200 MG tablet as directed Orally    [provider]  liothyronine  (CYTOMEL ) 5 MCG tablet TAKE 5 MCG BY MOUTH IN THE MORNING AND AT BEDTIME. 01/28/23   Wilkinson, Dana E, NP  MAGNESIUM  PO Take 1 capsule by mouth 2 (two) times daily.    [provider]  Melatonin 10 MG TABS Take 10 mg by mouth at bedtime.    [provider]  Menthol, Topical Analgesic, (BIOFREEZE EX) Apply 1 Application topically at bedtime as needed (leg pain).    [provider]  Misc Natural Products (OSTEO BI-FLEX JOINT SHIELD PO) Take 1 tablet by mouth 2 (two) times daily.    [provider]  Multiple Vitamins-Minerals (ZINC  PO) Take 1 tablet by mouth 2 (two) times daily.    [provider]  MYRBETRIQ  50 MG TB24 tablet 1 tablet Orally Once a day for 30 days 11/05/22   [provider]  olmesartan  (BENICAR ) 20 MG tablet Take  1 tablet  Daily  for BP 07/30/22   Tonita Fallow, MD  oxyCODONE -acetaminophen  (PERCOCET) 5-325 MG tablet Take 1 tablet by mouth every 6 (six) hours as needed. 04/12/24   Patt Alm Macho, MD  Polyethyl Glycol-Propyl Glycol (SYSTANE) 0.4-0.3 % GEL ophthalmic gel Place 1 Application into both eyes at bedtime.    [provider]  sertraline (ZOLOFT) 50 MG tablet See admin instructions. 1/2 tablet for 4 days then 1 each morning Orally Once a day 11/11/23   [provider]  TAURINE  PO Take 1 capsule by mouth daily.    [provider]    Physical Exam: Vitals:   05/15/24 2345 05/15/24 2355 05/16/24 0145 05/16/24 0405  BP: 124/63  (!) 144/72   Pulse: 71  76   Resp: (!) 22  20   Temp:  97.6 F (36.4 C)   98 F (36.7 C)  TempSrc:  Oral  Oral  SpO2: 100%  97%    General: Frail and cachectic looking female.  She was arousable and answers to some questions appropriately. HEENT: Oral mucosa was difficult to assess due to bloody laceration injury.  Poor dentition noted. Neck: Supple with no JVD Chest: Clinically diminished due to poor respiratory effort. Cardiovascular: S1-S2.  Mild murmur appreciated. Abdomen: Difficult to assess.  Scaphoid.  Unable to palpate any organomegaly at this time CNS: Unable to assess cranial nerves.  Patient noted to move all extremities equally.  Data Reviewed: Sodium is 137, potassium 3.7, chloride 96, bicarb is 28, glucose 127, BUN 20,  creatinine 0.66, calcium  9.6, magnesium  2.0 WBCs 13.6, hemoglobin 12.6, hematocrit 37, platelet count 178 PT is 15.2, INR 1.1, glucose 127. CT head shows no acute intracranial abnormality.  Chronic right frontal encephalomalacia with ex vacuo dilatation of the right lateral ventricle. Pending CT of the cervical spine shows no evidence of acute traumatic injury CT of the maxillofacial: No acute facial fracture.  MRI from 1/27 showed encephalomalacia involving right frontal lobe and basal valve.  Subcentimeter nodular focus of enhancement in the right frontal lobe was noted but this was reported to be unchanged from prior.  No new or progressive enhancements were noted.  Assessment and Plan:  89 year old female with history of CNS lymphoma, known history of right frontal lobe encephalomalacia.  Patient presents to the emergency room on account of unwitnessed fall/syncope at her facility.  Exam significant for lip laceration.  Differentials include acute seizures.  Syncope/unwitnessed fall: I suspect possible seizures.  Patient with encephalomalacia with brain parenchyma changes from prior brain surgery.  Could likely be a focus of seizures.  Will load with 500 mg Keppra  daily.  Consult with neurology to assist with EEG.  Long-term AED  will be deferred to neurology.  Infectious causes were ruled out.EKG reviewed shows normal QTc.  History of CNS lymphoma: Status post surgery/treatment in 2023 .    Hypothyroidism: Continue with home medications  Hypertension: continue with home medications. Pharmacy to confirm meds  VTE prophylaxis: .   Advance Care Planning:   Code Status: Limited: Do not attempt resuscitation (DNR) -DNR-LIMITED -Do Not Intubate/DNI    Consults: Neurology  Family Communication: No family at bedside at this time of the day  Severity of Illness: The appropriate patient status for this patient is OBSERVATION. Observation status is judged to be reasonable and necessary in order to provide the required intensity of service to ensure the patient's safety. The patient's presenting symptoms, physical exam findings, and initial radiographic and laboratory data in the context of their medical condition is felt to place them at decreased risk for further clinical deterioration. Furthermore, it is anticipated that the patient will be medically stable for discharge from the hospital within 2 midnights of admission.   Author: Maude MARLA Dart, MD 05/16/2024 4:35 AM  For on call review [email or web address].     [1]  Allergies Allergen Reactions   Barbiturates Other (See Comments)    Unknown reaction   Other Other (See Comments)    Patient has AIP and is not to have hormones or red wine   Pentothal [Thiopental] Other (See Comments)    Unknown reaction   Sulfa Antibiotics Other (See Comments)    Unknown reaction   "

## 2024-05-16 NOTE — Progress Notes (Signed)
 EEG complete - results pending

## 2024-05-17 DIAGNOSIS — Z8579 Personal history of other malignant neoplasms of lymphoid, hematopoietic and related tissues: Secondary | ICD-10-CM

## 2024-05-17 DIAGNOSIS — R569 Unspecified convulsions: Secondary | ICD-10-CM | POA: Diagnosis not present

## 2024-05-17 LAB — GLUCOSE, CAPILLARY: Glucose-Capillary: 87 mg/dL (ref 70–99)

## 2024-05-17 MED ORDER — CELECOXIB 100 MG PO CAPS: 100.0000 mg | ORAL_CAPSULE | Freq: Every day | ORAL | 0 refills | Status: AC | PRN

## 2024-05-17 MED ORDER — LEVETIRACETAM 500 MG PO TABS: 500.0000 mg | ORAL_TABLET | Freq: Two times a day (BID) | ORAL | Status: DC

## 2024-05-17 MED ORDER — LEVETIRACETAM 500 MG PO TABS: 500.0000 mg | ORAL_TABLET | Freq: Two times a day (BID) | ORAL | 2 refills | Status: AC

## 2024-05-17 MED ORDER — ACETAMINOPHEN 325 MG PO TABS: 650.0000 mg | ORAL_TABLET | Freq: Four times a day (QID) | ORAL | Status: AC | PRN

## 2024-05-17 NOTE — Care Management Obs Status (Signed)
 MEDICARE OBSERVATION STATUS NOTIFICATION   Patient Details  Name: Daisy Oliver MRN: [id number] Date of Birth: [birth year]   Medicare Observation Status Notification Given:  Yes On 05/16/2024 spoke with the patient daughter who ask us  to send a copy of the notice to the patient home address,   Claretta Deed 05/17/2024, 10:11 AM

## 2024-05-17 NOTE — Discharge Summary (Signed)
 Physician Discharge Summary  Daisy Oliver FMW:[id number] DOB: 18-Sep-1935 DOA: 05/15/2024  PCP: Daisy Nottingham, MD  Admit date: 05/15/2024 Discharge date: 05/17/2024  Admitted From: Assisted living facility Disposition: Assisted living facility  Recommendations for Outpatient Follow-up:  Follow up with PCP in 1-2 weeks Referral to neurology, office will call with appointment.  Home Health: PT/OT Equipment/Devices: Available at home  Discharge Condition: Fair CODE STATUS: DNR/DNI Diet recommendation: Regular diet, nutritional supplements  Discharge summary: 89 year old with history of CNS lymphoma treated with radiation, GERD, hypertension, hypothyroidism currently living at assisted living facility presented with unwitnessed fall, found down on the floor with vomiting and not recalling the events.  Skeletal survey was negative in the ER.  Hemodynamically stable.  Recent MRI of the brain was stable.  Thought to be seizure, started on Keppra  and admitted with neurology consultation.   Syncope/unwitnessed fall and suspected seizure in a patient with brain radiation for lymphoma: Seizure precautions.  Fall precautions.  PT OT. Loaded with Keppra  and continues to be on Keppra  now.  Discharging with oral Keppra .  She does have well-established outpatient neurology follow-up.  Will send referral again. EEG was abnormal with some areas of activity corresponding to encephalomalacia from previous lymphoma. CT head, cervical spine and maxillofacial without any acute injury. MRI of the brain 1/27 with encephalomalacia right frontal lobe.  Likely focus of seizure. Continue all long-term medications. Discharging back to ALF.  All-time fall precautions.  Use assistive device.  She will benefit with PT OT.  Discharge Diagnoses:  Principal Problem:   Syncope    Discharge Instructions  Discharge Instructions     Ambulatory referral to Neurology   Complete by: As directed    An appointment is  requested in approximately: 4 weeks   Diet general   Complete by: As directed    Increase activity slowly   Complete by: As directed       Allergies as of 05/17/2024       Reactions   Barbiturates Other (See Comments)   Unknown reaction Not listed on the [facility]   Gluten Meal Diarrhea, Nausea Only   Other Other (See Comments)   Patient has AIP and is not to have hormones or red wine   Pentothal [thiopental] Other (See Comments)   Unknown reaction Not listed on the MAR   Sulfa Antibiotics Other (See Comments)   Unknown reaction        Medication List     STOP taking these medications    ciprofloxacin 250 MG tablet Commonly known as: CIPRO   hydroxychloroquine  200 MG tablet Commonly known as: PLAQUENIL    Melatonin 10 MG Tabs   olmesartan  20 MG tablet Commonly known as: BENICAR    Systane 0.4-0.3 % Gel ophthalmic gel Generic drug: Polyethyl Glycol-Propyl Glycol   VITAMIN C PO       TAKE these medications    acetaminophen  325 MG tablet Commonly known as: TYLENOL  Take 2 tablets (650 mg total) by mouth every 6 (six) hours as needed for mild pain (pain score 1-3), fever or headache (or Fever >/= 101).   BIOFREEZE EX Apply 1 Application topically at bedtime as needed (leg pain).   celecoxib  100 MG capsule Commonly known as: CELEBREX  Take 1 capsule (100 mg total) by mouth daily as needed (for pain in legs and back).   famotidine  20 MG tablet Commonly known as: PEPCID  Take 20 mg by mouth daily as needed for heartburn.   gabapentin  100 MG capsule Commonly known as: NEURONTIN  TAKE  1 CAPSULE BY MOUTH TWICE A DAY   HM Lidocaine  Patch 4 % Generic drug: lidocaine  Place 1 patch onto the skin daily. Apply to right hip   levETIRAcetam  500 MG tablet Commonly known as: KEPPRA  Take 1 tablet (500 mg total) by mouth 2 (two) times daily.   liothyronine  5 MCG tablet Commonly known as: CYTOMEL  TAKE 5 MCG BY MOUTH IN THE MORNING AND AT BEDTIME.   Myrbetriq  50 MG Tb24  tablet Generic drug: mirabegron  ER 1 tablet Orally Once a day for 30 days   oxyCODONE -acetaminophen  5-325 MG tablet Commonly known as: Percocet Take 1 tablet by mouth every 6 (six) hours as needed. What changed: reasons to take this   senna 8.6 MG Tabs tablet Commonly known as: SENOKOT Take 1 tablet by mouth daily.   sertraline 50 MG tablet Commonly known as: ZOLOFT Take 50 mg by mouth daily.        Allergies[1]  Consultations: Neurology   Procedures/Studies: DG Tibia/Fibula Left Result Date: 05/16/2024 CLINICAL DATA:  Pain EXAM: LEFT TIBIA AND FIBULA - 2 VIEW COMPARISON:  12/28/2023 FINDINGS: Frontal and lateral views of the left tibia and fibula are obtained. There are no acute displaced fractures. Alignment is anatomic. There is mild osteoarthritis of the left knee and ankle. Diffuse subcutaneous edema. IMPRESSION: 1. Diffuse subcutaneous edema. 2. No acute fracture. 3. 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 Electronically Signed   By: Daisy Oliver M.D.   On: 05/16/2024 20:18   ECHOCARDIOGRAM COMPLETE Result Date: 05/16/2024    ECHOCARDIOGRAM REPORT   Patient Name:   Daisy Oliver [facility] Date of Exam: 05/16/2024 Medical Rec #:  [id number]           Height:       64.0 in Accession #:    [id number]          Weight:       121.5 lb Date of Birth:  [birth year]           BSA:          1.583 m Patient Age:    88 years            BP:           112/57 mmHg Patient Gender: F                   HR:           64 bpm. Exam Location:  Inpatient Procedure: 2D Echo, Cardiac Doppler and Color Doppler (Both Spectral and Color            Flow Doppler were utilized during procedure). Indications:    Syncope  History:        Patient has no prior history of Echocardiogram examinations.                 Risk Factors:Hypertension and Dyslipidemia. CKD.  Sonographer:    Daisy Oliver Referring Phys: Daisy Oliver IMPRESSIONS  1. Left ventricular ejection fraction, by estimation, is 65 to 70%. The  left ventricle has normal function. The left ventricle has no regional wall motion abnormalities. Left ventricular diastolic parameters are consistent with Grade I diastolic dysfunction (impaired relaxation).  2. Right ventricular systolic function is normal. The right ventricular size is normal.  3. There is no evidence of pericardial effusion.  4. The mitral valve is normal in structure. No evidence of mitral valve regurgitation. No evidence of mitral stenosis.  5. The aortic valve is tricuspid. There  is mild thickening of the aortic valve. Aortic valve regurgitation is mild. Aortic valve sclerosis/calcification is present, without any evidence of aortic stenosis.  6. The inferior vena cava is normal in size with greater than 50% respiratory variability, suggesting right atrial pressure of 3 mmHg. FINDINGS  Left Ventricle: Left ventricular ejection fraction, by estimation, is 65 to 70%. The left ventricle has normal function. The left ventricle has no regional wall motion abnormalities. The left ventricular internal cavity size was normal in size. There is  no left ventricular hypertrophy. Left ventricular diastolic parameters are consistent with Grade I diastolic dysfunction (impaired relaxation). Right Ventricle: The right ventricular size is normal. No increase in right ventricular wall thickness. Right ventricular systolic function is normal. Left Atrium: Left atrial size was normal in size. Right Atrium: Right atrial size was normal in size. Pericardium: There is no evidence of pericardial effusion. Mitral Valve: The mitral valve is normal in structure. No evidence of mitral valve regurgitation. No evidence of mitral valve stenosis. Tricuspid Valve: The tricuspid valve is normal in structure. Tricuspid valve regurgitation is mild . No evidence of tricuspid stenosis. Aortic Valve: The aortic valve is tricuspid. There is mild thickening of the aortic valve. Aortic valve regurgitation is mild. Aortic valve  sclerosis/calcification is present, without any evidence of aortic stenosis. Aortic valve mean gradient measures 3.0 mmHg. Aortic valve peak gradient measures 4.7 mmHg. Aortic valve area, by VTI measures 3.91 cm. Pulmonic Valve: The pulmonic valve was normal in structure. Pulmonic valve regurgitation is not visualized. No evidence of pulmonic stenosis. Aorta: The aortic root and ascending aorta are structurally normal, with no evidence of dilitation. Venous: The inferior vena cava is normal in size with greater than 50% respiratory variability, suggesting right atrial pressure of 3 mmHg. IAS/Shunts: No atrial level shunt detected by color flow Doppler.  LEFT VENTRICLE PLAX 2D LVIDd:         4.40 cm   Diastology LVIDs:         2.50 cm   LV e' medial:    7.29 cm/s LV PW:         0.70 cm   LV E/e' medial:  9.5 LV IVS:        0.70 cm   LV e' lateral:   10.60 cm/s LVOT diam:     2.20 cm   LV E/e' lateral: 6.5 LV SV:         100 LV SV Index:   63 LVOT Area:     3.80 cm  RIGHT VENTRICLE             IVC RV Basal diam:  3.00 cm     IVC diam: 1.60 cm RV Mid diam:    2.40 cm RV S prime:     13.30 cm/s TAPSE (M-mode): 2.6 cm LEFT ATRIUM             Index        RIGHT ATRIUM           Index LA diam:        2.40 cm 1.52 cm/m   RA Area:     11.80 cm LA Vol (A2C):   26.7 ml 16.87 ml/m  RA Volume:   22.10 ml  13.96 ml/m LA Vol (A4C):   37.8 ml 23.88 ml/m LA Biplane Vol: 34.7 ml 21.92 ml/m  AORTIC VALVE AV Area (Vmax):    3.77 cm AV Area (Vmean):   3.64 cm AV Area (VTI):  3.91 cm AV Vmax:           108.00 cm/s AV Vmean:          72.700 cm/s AV VTI:            0.255 m AV Peak Grad:      4.7 mmHg AV Mean Grad:      3.0 mmHg LVOT Vmax:         107.00 cm/s LVOT Vmean:        69.700 cm/s LVOT VTI:          0.262 m LVOT/AV VTI ratio: 1.03  AORTA Ao Root diam: 3.00 cm Ao Asc diam:  3.10 cm MITRAL VALVE               TRICUSPID VALVE MV Area (PHT): 2.79 cm    TR Peak grad:   22.7 mmHg MV Decel Time: 272 msec    TR Vmax:         238.00 cm/s MV E velocity: 69.20 cm/s MV A velocity: 88.60 cm/s  SHUNTS MV E/A ratio:  0.78        Systemic VTI:  0.26 m                            Systemic Diam: 2.20 cm Morene Brownie Electronically signed by Morene Brownie Signature Date/Time: 05/16/2024/12:29:12 PM    Final    EEG adult Result Date: 05/16/2024 Shelton Arlin KIDD, MD     05/16/2024 11:19 AM Patient Name: Aelyn Stanaland MRN: [id number] Epilepsy Attending: Arlin KIDD Shelton Referring Physician/Provider: Rockney Eugenie HERO, MD Date: 05/16/2024 Duration: 22.29 mins Patient history: 89 y.o. female presenting with events concerning for seizures.  EEG to evaluate for seizure Level of alertness: Awake AEDs during EEG study: LEV Technical aspects: This EEG study was done with scalp electrodes positioned according to the 10-20 International system of electrode placement. Electrical activity was reviewed with band pass filter of 1-70Hz , sensitivity of 7 uV/mm, display speed of 27mm/sec with a 60Hz  notched filter applied as appropriate. EEG data were recorded continuously and digitally stored.  Video monitoring was available and reviewed as appropriate. Description: The posterior dominant rhythm consists of 6-7 Hz activity of moderate voltage (25-35 uV) seen predominantly in posterior head regions, symmetric and reactive to eye opening and eye closing. EEG showed continuous generalized and maximal right frontal 3 to 6 Hz theta-delta slowing. Hyperventilation and photic stimulation were not performed.   EEG was technically difficult due to significant myogenic artifact. ABNORMALITY - Continuous slow, generalized and maximal right frontal IMPRESSION: This technically difficult study is suggestive of cortical dysfunction arising from right frontal region likely secondary to underlying structural abnormality. Additionally there is mild generalized cerebral dysfunction (encephalopathy). No seizures or epileptiform discharges were seen throughout the recording.  Arlin KIDD Shelton   DG Shoulder Right Portable Result Date: 05/16/2024 EXAM: 1 VIEW(S) XRAY OF THE RIGHT SHOULDER 05/16/2024 03:36:57 AM COMPARISON: None available. CLINICAL HISTORY: Fall. FINDINGS: BONES AND JOINTS: Glenohumeral joint is normally aligned. Mild degenerative changes at the glenohumeral joint. No acute fracture. No malalignment. The [facility] joint is unremarkable. SOFT TISSUES: No abnormal calcifications. Visualized lung is unremarkable. Aortic atherosclerosis (ICD10-170.0). IMPRESSION: 1. No fracture or dislocation. 2. Mild degenerative changes at the glenohumeral joint. Electronically signed by: Greig Pique MD 05/16/2024 03:47 AM EST RP Workstation: [id number]   DG Pelvis Portable Result Date: 05/16/2024 EXAM: 1 or 2 VIEW(S) XRAY OF THE PELVIS 05/16/2024 12:47:24  AM COMPARISON: CT chest abdomen and pelvis dated 05/16/2024. CLINICAL HISTORY: Trauma. FINDINGS: BONES AND JOINTS: There are healed left superior and inferior pubic rami fractures. No acute fractures are seen. There are degenerative changes at L5-S1. The bones are osteopenic. Joint spaces are maintained. No malalignment. SOFT TISSUES: Unremarkable. IMPRESSION: 1. No acute pelvic fracture. 2. Healed left superior and inferior pubic rami fractures. 3. Osteopenia. Electronically signed by: Greig Pique MD 05/16/2024 01:12 AM EST RP Workstation: [id number]   DG Chest Port 1 View Result Date: 05/16/2024 EXAM: 1 VIEW(S) XRAY OF THE CHEST 05/16/2024 12:47:24 AM COMPARISON: 04/12/2024 CLINICAL HISTORY: Trauma. FINDINGS: LINES, TUBES AND DEVICES: Overlapping cardiac leads. LUNGS AND PLEURA: Underinflation. No focal pulmonary opacity. No pleural effusion. No pneumothorax. HEART AND MEDIASTINUM: Calcified aorta. BONES AND SOFT TISSUES: Degenerative changes in spine and shoulders. Osteopenia. IMPRESSION: 1. No acute cardiopulmonary process. Electronically signed by: Greig Pique MD 05/16/2024 01:09 AM EST RP Workstation: [id number]   CT CHEST ABDOMEN  PELVIS WO CONTRAST Result Date: 05/16/2024 EXAM: CT CHEST, ABDOMEN AND PELVIS WITHOUT CONTRAST 05/16/2024 12:17:00 AM TECHNIQUE: CT of the chest, abdomen and pelvis was performed without the administration of intravenous contrast. Multiplanar reformatted images are provided for review. Automated exposure control, iterative reconstruction, and/or weight based adjustment of the mA/kV was utilized to reduce the radiation dose to as low as reasonably achievable. COMPARISON: CT 1022 20025 and pelvis CT 821 2025. CLINICAL HISTORY: Polytrauma, blunt. FINDINGS: CHEST: MEDIASTINUM AND LYMPH NODES: Heart and pericardium are unremarkable. The central airways are clear. No mediastinal, hilar or axillary lymphadenopathy. LUNGS AND PLEURA: There are scattered peripheral reticular opacities throughout both lungs, chronic and unchanged. Left lower lobe and right apical nodular density have resolved in the interval. There is no focal lung infiltrate, pleural effusion or pneumothorax. ABDOMEN AND PELVIS: LIVER: Unremarkable. GALLBLADDER AND BILE DUCTS: Unremarkable. No biliary ductal dilatation. SPLEEN: There are calcified granulomas in the spleen. PANCREAS: No acute abnormality. ADRENAL GLANDS: No acute abnormality. KIDNEYS, URETERS AND BLADDER: No stones in the kidneys or ureters. No hydronephrosis. No perinephric or periureteral stranding. Urinary bladder is unremarkable. GI AND BOWEL: Stomach demonstrates no acute abnormality. There is no bowel obstruction. There is sigmoid colon diverticulosis. The appendix is within normal limits. REPRODUCTIVE ORGANS: No acute abnormality. PERITONEUM AND RETROPERITONEUM: No ascites. No free air. VASCULATURE: Aorta is normal in caliber. There are atherosclerotic calcifications of the aorta. ABDOMINAL AND PELVIS LYMPH NODES: No lymphadenopathy. BONES AND SOFT TISSUES: Bones are diffusely osteopenic. Healed left sacroiliac fracture again seen. There is a healed/healing left inferior pubic ramus  fracture. Chronic compression deformity of L1 is again noted. Degenerative changes affect the spine. There is a healed left 9th rib fracture. There is a small fat containing umbilical hernia. IMPRESSION: 1. No evidence of acute traumatic injury. 2. Scattered peripheral reticular opacities in both lungs, chronic and unchanged, with interval resolution of prior left lower lobe and right apical nodular densities. 3. Healed left ninth rib fracture, healed left sacroiliac fracture, and healed/healing left inferior pubic ramus fracture, with chronic L1 compression deformity, diffuse osteopenia, and degenerative spinal changes. 4. Sigmoid diverticulosis, small fat-containing umbilical hernia, calcified splenic granulomas, and aortic atherosclerosis. Electronically signed by: Greig Pique MD 05/16/2024 12:53 AM EST RP Workstation: [id number]   CT T-SPINE NO CHARGE Result Date: 05/16/2024 EXAM: CT THORACIC SPINE WITHOUT CONTRAST 05/16/2024 12:17:00 AM TECHNIQUE: CT of the thoracic spine was performed without the administration of intravenous contrast. Multiplanar reformatted images are provided for review. Automated exposure control, iterative reconstruction, and/or weight  based adjustment of the mA/kV was utilized to reduce the radiation dose to as low as reasonably achievable. COMPARISON: None available. CLINICAL HISTORY: FINDINGS: BONES AND ALIGNMENT: Normal vertebral body heights. No acute fracture or suspicious bone lesion. Normal alignment. DEGENERATIVE CHANGES: No significant degenerative changes. SOFT TISSUES: No acute abnormality. IMPRESSION: 1. No acute abnormality of the thoracic spine. Electronically signed by: Franky Stanford MD 05/16/2024 12:39 AM EST RP Workstation: [id number]   CT L-SPINE NO CHARGE Result Date: 05/16/2024 EXAM: CT OF THE LUMBAR SPINE WITHOUT CONTRAST 05/16/2024 12:17:00 AM TECHNIQUE: CT of the lumbar spine was performed without the administration of intravenous contrast. Multiplanar  reformatted images are provided for review. Automated exposure control, iterative reconstruction, and/or weight based adjustment of the mA/kV was utilized to reduce the radiation dose to as low as reasonably achievable. COMPARISON: Lumbar spine radiographs dated 04/12/2024. CLINICAL HISTORY: FINDINGS: BONES AND ALIGNMENT: Wedge compression fracture of L1 with approximately 25% height loss, no retropulsion. Age indeterminate but new since 04/12/2024. Transitional lumbosacral anatomy with partial sacralization of L5 with bilateral assimilation joints. Normal alignment. No suspicious bone lesion. DEGENERATIVE CHANGES: L4-L5 disc vacuum phenomenon with right asymmetric endplate sclerosis. SOFT TISSUES: No acute abnormality. IMPRESSION: 1. Wedge compression fracture of L1 with approximately 25% height loss, no retropulsion. Age indeterminate but new since 04/12/2024. Electronically signed by: Franky Stanford MD 05/16/2024 12:38 AM EST RP Workstation: [id number]   CT CERVICAL SPINE WO CONTRAST Result Date: 05/16/2024 EXAM: CT CERVICAL SPINE WITHOUT CONTRAST 05/16/2024 12:17:00 AM TECHNIQUE: CT of the cervical spine was performed without the administration of intravenous contrast. Multiplanar reformatted images are provided for review. Automated exposure control, iterative reconstruction, and/or weight based adjustment of the mA/kV was utilized to reduce the radiation dose to as low as reasonably achievable. COMPARISON: None available. CLINICAL HISTORY: Polytrauma, blunt. FINDINGS: BONES AND ALIGNMENT: No acute fracture or traumatic malalignment. DEGENERATIVE CHANGES: Multilevel degenerative disc disease without bony spinal canal stenosis. SOFT TISSUES: No prevertebral soft tissue swelling. IMPRESSION: 1. No evidence of acute traumatic injury. Electronically signed by: Franky Stanford MD 05/16/2024 12:34 AM EST RP Workstation: [id number]   CT MAXILLOFACIAL WO CONTRAST Result Date: 05/16/2024 EXAM: CT OF THE FACE WITHOUT  CONTRAST 05/16/2024 12:17:00 AM TECHNIQUE: CT of the face was performed without the administration of intravenous contrast. Multiplanar reformatted images are provided for review. Automated exposure control, iterative reconstruction, and/or weight based adjustment of the mA/kV was utilized to reduce the radiation dose to as low as reasonably achievable. COMPARISON: None available. CLINICAL HISTORY: Facial trauma, blunt. Facial trauma, blunt. FINDINGS: FACIAL BONES: Right temporomandibular joint moderate osteoarthrosis. No acute facial fracture. No mandibular dislocation. No suspicious bone lesion. ORBITS: Globes are intact. No acute traumatic injury. No inflammatory change. SINUSES AND MASTOIDS: No acute abnormality. SOFT TISSUES: No acute abnormality. IMPRESSION: 1. No acute facial fracture. 2. Right temporomandibular joint moderate osteoarthrosis. Electronically signed by: Franky Stanford MD 05/16/2024 12:32 AM EST RP Workstation: [id number]   CT HEAD WO CONTRAST Result Date: 05/16/2024 EXAM: CT HEAD WITHOUT CONTRAST 05/16/2024 12:17:00 AM TECHNIQUE: CT of the head was performed without the administration of intravenous contrast. Automated exposure control, iterative reconstruction, and/or weight based adjustment of the mA/kV was utilized to reduce the radiation dose to as low as reasonably achievable. COMPARISON: 12/02/2023 CLINICAL HISTORY: Head trauma, moderate-severe. FINDINGS: BRAIN AND VENTRICLES: No acute hemorrhage. No evidence of acute infarct. Chronic encephalomalacia in right frontal lobe. Patchy low-density changes within periventricular and subcortical white matter consistent with chronic microvascular ischemic change. Mild diffuse cerebral volume loss.  Ex vacuo dilatation of right lateral ventricle. No extra-axial collection. No mass effect or midline shift. ORBITS: No acute abnormality. SINUSES: No acute abnormality. SOFT TISSUES AND SKULL: No acute soft tissue abnormality. No skull fracture.  Atherosclerotic calcifications in large vessels of skull base. IMPRESSION: 1. No acute intracranial abnormality. 2. Chronic right frontal encephalomalacia with ex vacuo dilatation of the right lateral ventricle. Electronically signed by: Franky Stanford MD 05/16/2024 12:31 AM EST RP Workstation: [id number]   MR BRAIN W WO CONTRAST Result Date: 05/09/2024 EXAM: MRI BRAIN WITH AND WITHOUT CONTRAST 05/09/2024 11:00:56 AM TECHNIQUE: Multiplanar multisequence MRI of the head/brain was performed with and without the administration of intravenous contrast. COMPARISON: MRI head 10/20/2023. CLINICAL HISTORY: Brain/CNS neoplasm, assess treatment response. Primary CNS lymphoma status post chemotherapy and whole brain radiation. FINDINGS: BRAIN AND VENTRICLES: Encephalomalacia is again noted in the right frontal lobe and basal ganglia with associated chronic blood products and ex vacuo dilatation of the frontal horn of the right lateral ventricle. A subcentimeter nodular focus of enhancement in the right frontal lobe is unchanged and likely reflects scarring. No new or progressive abnormal intracranial enhancement is identified. Confluent T2 hyperintensities in the right greater than the left cerebral hemispheric white matter are unchanged. There is also mild chronic T2 hyperintensity in the pons. There is mild cerebral atrophy. No acute infarct, midline shift, hydrocephalus, or extra-axial fluid collection is identified. Major intracranial vascular flow voids are preserved. ORBITS: Bilateral cataract extraction. SINUSES: Clear paranasal sinuses. Trace right and small left mastoid effusions. BONES AND SOFT TISSUES: Right frontal burr hole.  No soft tissue abnormality. IMPRESSION: 1. Stable post-treatment changes. No evidence of recurrent disease. Electronically signed by: Dasie Hamburg MD 05/09/2024 11:39 AM EST RP Workstation: [id number]   (Echo, Carotid, EGD, Colonoscopy, ERCP)    Subjective: Patient seen in the morning  rounds.  Today she denies any complaints.  Caretaker at the bedside.  Caretaker was stating patient complained of some neck pain.   Discharge Exam: Vitals:   05/17/24 0447 05/17/24 0753  BP: 122/61 130/62  Pulse: 67 (!) 58  Resp: 20 18  Temp: 99.1 F (37.3 C) 98 F (36.7 C)  SpO2: 96% 96%   Vitals:   05/17/24 0010 05/17/24 0447 05/17/24 0458 05/17/24 0753  BP: (!) 121/55 122/61  130/62  Pulse: 62 67  (!) 58  Resp: 18 20  18   Temp: 97.7 F (36.5 C) 99.1 F (37.3 C)  98 F (36.7 C)  TempSrc:      SpO2: 96% 96%  96%  Weight:   57.8 kg     General: Pt is alert, awake, not in acute distress Frail and debilitated.  Chronically sick looking.  She has bruises on her right side of the face, bruises on her lips.  Patient also has multiple bruises and ecchymosis of both legs. Cardiovascular: RRR, S1/S2 +, no rubs, no gallops Respiratory: CTA bilaterally, no wheezing, no rhonchi Abdominal: Soft, NT, ND, bowel sounds + Extremities: Mostly chronic edema both legs with chronic stasis changes, skin bruises. Patient is alert awake.  She is forgetful.  Has flat affect.  Gross generalized weakness.   The results of significant diagnostics from this hospitalization (including imaging, microbiology, ancillary and laboratory) are listed below for reference.     Microbiology: No results found for this or any previous visit (from the past 240 hours).   Labs: BNP (last 3 results) No results for input(s): BNP in the last 8760 hours. Basic Metabolic Panel: Recent Labs  Lab  05/15/24 2333 05/15/24 2350 05/16/24 0529  NA 136 137 134*  K 4.1 3.7 4.1  CL 97* 96* 98  CO2 28  --  27  GLUCOSE 127* 127* 114*  BUN 20 22 18   CREATININE 0.66 0.70 0.60  CALCIUM  9.6  --  9.0  MG 2.0  --   --   PHOS  --   --  3.0   Liver Function Tests: Recent Labs  Lab 05/15/24 2333  AST 39  ALT 20  ALKPHOS 90  BILITOT 0.9  PROT 7.7  ALBUMIN 4.0   No results for input(s): LIPASE, AMYLASE in the  last 168 hours. No results for input(s): AMMONIA in the last 168 hours. CBC: Recent Labs  Lab 05/15/24 2333 05/15/24 2350 05/16/24 0529  WBC 13.6*  --  9.1  HGB 12.6 13.3 11.3*  HCT 37.7 39.0 34.3*  MCV 93.8  --  94.5  PLT 178  --  154   Cardiac Enzymes: Recent Labs  Lab 05/15/24 2333  CKTOTAL 133   BNP: Invalid input(s): POCBNP CBG: Recent Labs  Lab 05/16/24 0555 05/17/24 0448  GLUCAP 108* 87   D-Dimer No results for input(s): DDIMER in the last 72 hours. Hgb A1c No results for input(s): HGBA1C in the last 72 hours. Lipid Profile No results for input(s): CHOL, HDL, LDLCALC, TRIG, CHOLHDL, LDLDIRECT in the last 72 hours. Thyroid function studies Recent Labs    05/16/24 0529  TSH 1.700   Anemia work up No results for input(s): VITAMINB12, FOLATE, FERRITIN, TIBC, IRON, RETICCTPCT in the last 72 hours. Urinalysis    Component Value Date/Time   COLORURINE YELLOW 05/15/2024 0216   APPEARANCEUR CLEAR 05/15/2024 0216   LABSPEC 1.020 05/15/2024 0216   PHURINE 5.0 05/15/2024 0216   GLUCOSEU NEGATIVE 05/15/2024 0216   HGBUR NEGATIVE 05/15/2024 0216   BILIRUBINUR NEGATIVE 05/15/2024 0216   KETONESUR NEGATIVE 05/15/2024 0216   PROTEINUR NEGATIVE 05/15/2024 0216   UROBILINOGEN 0.2 08/28/2014 0958   NITRITE NEGATIVE 05/15/2024 0216   LEUKOCYTESUR NEGATIVE 05/15/2024 0216   Sepsis Labs Recent Labs  Lab 05/15/24 2333 05/16/24 0529  WBC 13.6* 9.1   Microbiology No results found for this or any previous visit (from the past 240 hours).   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

## 2024-05-17 NOTE — Progress Notes (Signed)
 NEUROLOGY CONSULT FOLLOW UP NOTE   Date of service: May 17, 2024 Patient Name: Daisy Oliver MRN:  [id number] DOB:  1936/02/11  Interval Hx/subjective   EEG notes no seizures, suggestive of encehpalopthy. Caregiver at bedside, patient at baseline.   Vitals   Vitals:   05/17/24 0010 05/17/24 0447 05/17/24 0458 05/17/24 0753  BP: (!) 121/55 122/61  130/62  Pulse: 62 67  (!) 58  Resp: 18 20  18   Temp: 97.7 F (36.5 C) 99.1 F (37.3 C)  98 F (36.7 C)  TempSrc:      SpO2: 96% 96%  96%  Weight:   57.8 kg      Body mass index is 21.87 kg/m.  Physical Exam   Constitutional: Appears well-developed and well-nourished.   Neurologic Examination   Neuro: Mental Status: Patient is awake, alert, oriented to person, place, month, year, and situation. Patient is able to give a clear and coherent history, with some repetition and drifting of conversation.  No signs of aphasia or neglect Cranial Nerves: II: Visual Fields are full. Pupils are equal, round, and reactive to light.   III,IV, VI: EOMI without ptosis or diploplia.  V: Facial sensation is symmetric to light touch VII: Facial movement is symmetric.  VIII: hearing is intact to voice X: Uvula elevates symmetrically. No dysarthria. XI: Shoulder shrug is symmetric. XII: tongue is midline without atrophy or fasciculations.  Motor: Tone is normal. Bulk is normal.  BLE: 4-/5 when lifting off bed, 4/5 dorsal and plntar flexion Sensory: Sensation is symmetric to light touch in the arms and legs. Cerebellar: FNF intact bilaterally. No overt ataxia.    Medications Current Medications[1]  Labs and Diagnostic Imaging   CBC:  Recent Labs  Lab 05/15/24 2333 05/15/24 2350 05/16/24 0529  WBC 13.6*  --  9.1  HGB 12.6 13.3 11.3*  HCT 37.7 39.0 34.3*  MCV 93.8  --  94.5  PLT 178  --  154    Basic Metabolic Panel:  Lab Results  Component Value Date   NA 134 (L) 05/16/2024   K 4.1 05/16/2024   CO2 27  05/16/2024   GLUCOSE 114 (H) 05/16/2024   BUN 18 05/16/2024   CREATININE 0.60 05/16/2024   CALCIUM  9.0 05/16/2024   GFRNONAA >60 05/16/2024   GFRAA 76 09/19/2020   Lipid Panel:  Lab Results  Component Value Date   LDLCALC 87 07/30/2022   HgbA1c:  Lab Results  Component Value Date   HGBA1C 5.4 07/30/2022   Urine Drug Screen:     Component Value Date/Time   LABOPIA NONE DETECTED 08/27/2021 1800   COCAINSCRNUR NONE DETECTED 08/27/2021 1800   LABBENZ NONE DETECTED 08/27/2021 1800   AMPHETMU NONE DETECTED 08/27/2021 1800   THCU NONE DETECTED 08/27/2021 1800   LABBARB NONE DETECTED 08/27/2021 1800    Alcohol Level     Component Value Date/Time   ETH <15 05/16/2024 0948   INR  Lab Results  Component Value Date   INR 1.1 05/15/2024   APTT  Lab Results  Component Value Date   APTT 25 08/27/2021   AED levels: No results found for: PHENYTOIN, ZONISAMIDE, LAMOTRIGINE, LEVETIRACETA  CT Head without contrast(Personally reviewed): No acute intracranial abnormality. Chronic right frontal encephalomalacia with ex vacuo dilatation of the right lateral ventricle.  CT  C spine: Negative CT Maxillofacial: Negative  CT C and L Spine: Negative  05/09/24 MRI Brain(Personally reviewed): Stable post-treatment changes. No evidence of recurrent disease.   rEEG:  Continuous slow,  generalized and maximal right frontal.  This technically difficult study is suggestive of cortical dysfunction arising from right frontal region likely secondary to underlying structural abnormality. Additionally there is mild generalized cerebral dysfunction (encephalopathy). No seizures or epileptiform discharges were seen throughout the recording.   Assessment   Daisy Oliver is a 89 y.o. female presented 2/2 late pm with events concerning for seizures.  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  Post-op MRI is stable with no evidence of recurrence of lymphoma.  The area of encephalomalacia may be a seizure focus.  Currently, no suspicion for meningitis and nonconvulsive status epilepticus.  Keppra  was started on admission.  CT head is unremarkable today, labs reviewed. MRI was not repeated due to recent stable imaging. Routine EEG was negative.   On exam today, patient with bilateral leg weakness, which caregiver states is chronic. Due to patient's history of CNS lyphoma and increased risk of seizures, would continue her on Keppra  and have her followup with her neurologist outpatient for continued medication management.   Recommendations   - continue Keppra  500mg  BID - outpatient neurology follow-up  SEIZURE PRECAUTIONS Per [place] statutes, patients with seizures are not allowed to drive until they have been seizure-free for six months.   Use caution when using heavy equipment or power tools. Avoid working on ladders or at heights. Take showers instead of baths. Ensure the water temperature is not too high on the home water heater. Do not go swimming alone. Do not lock yourself in a room alone (i.e. bathroom). When caring for infants or small children, sit down when holding, feeding, or changing them to minimize risk of injury to the child in the event you have a seizure. Maintain good sleep hygiene. Avoid alcohol.    If patient has another seizure, call 911 and bring them back to the ED if: A.  The seizure lasts longer than 5 minutes.      B.  The patient doesn't wake shortly after the seizure or has new problems such as difficulty seeing, speaking or moving following the seizure C.  The patient was injured during the seizure D.  The patient has a temperature over 102 F (39C) E.  The patient vomited during the seizure and now is having trouble breathing   Patient is OK for discharge from neurology standpoint, with recommendations as above. Follow-up with outpatient  neurology.   ______________________________________________________________________   Bonney Rocky JAYSON Judithe, NP [facility]      [1]  Current Facility-Administered Medications:    acetaminophen  (TYLENOL ) tablet 650 mg, 650 mg, Oral, Q6H PRN, 650 mg at 05/17/24 0049 **OR** acetaminophen  (TYLENOL ) suppository 650 mg, 650 mg, Rectal, Q6H PRN, Acheampong, Maude POUR, MD   famotidine  (PEPCID ) tablet 20 mg, 20 mg, Oral, QHS PRN, Acheampong, Peter K, MD   fentaNYL  (SUBLIMAZE ) injection 50 mcg, 50 mcg, Intravenous, Q1H PRN, Melvenia Motto, MD, 50 mcg at 05/16/24 0038   levETIRAcetam  (KEPPRA ) tablet 500 mg, 500 mg, Oral, BID, Ghimire, Kuber, MD   liothyronine  (CYTOMEL ) tablet 5 mcg, 5 mcg, Oral, BID, Ghimire, Kuber, MD, 5 mcg at 05/16/24 2112   melatonin tablet 10 mg, 10 mg, Oral, QHS, Acheampong, Peter K, MD, 10 mg at 05/16/24 2112   mirabegron  ER (MYRBETRIQ ) tablet 50 mg, 50 mg, Oral, Daily, Acheampong, Peter K, MD, 50 mg at 05/16/24 1725   ondansetron  (ZOFRAN ) tablet 4 mg, 4 mg, Oral,  Q6H PRN **OR** ondansetron  (ZOFRAN ) injection 4 mg, 4 mg, Intravenous, Q6H PRN, Acheampong, Peter K, MD   oxyCODONE -acetaminophen  (PERCOCET/ROXICET) 5-325 MG per tablet 1 tablet, 1 tablet, Oral, Q6H PRN, Acheampong, Peter K, MD   senna-docusate (Senokot-S) tablet 1 tablet, 1 tablet, Oral, QHS PRN, Acheampong, Maude POUR, MD   sodium chloride  flush (NS) 0.9 % injection 3 mL, 3 mL, Intravenous, Q12H, Acheampong, Maude POUR, MD, 3 mL at 05/16/24 2113   sodium chloride  flush (NS) 0.9 % injection 3 mL, 3 mL, Intravenous, Q12H, Acheampong, Maude POUR, MD, 3 mL at 05/16/24 2113   sodium chloride  flush (NS) 0.9 % injection 3 mL, 3 mL, Intravenous, PRN, Acheampong, Peter K, MD

## 2024-05-17 NOTE — TOC Transition Note (Signed)
 Transition of Care [facility]) - Discharge Note   Patient Details  Name: Daisy Oliver MRN: [id number] Date of Birth: 1935-05-20  [facility] of [facility]) CM/SW Contact:  Sherline Clack, LCSWA Phone Number: 05/17/2024, 11:03 AM   Clinical Narrative:     Patient will DC to: [facility] Anticipated DC date: 05/17/24  Family notified: daughter Transport by: [facility]   Per MD patient ready for DC to [facility]. RN, patient, patient's family, and facility notified of DC. Discharge Summary and FL2 sent to facility. DC packet on chart. Ambulance transport requested for patient.   CSW will sign off for now as social work intervention is no longer needed. Please consult us  again if new needs arise.    Final next level of care: Assisted Living Barriers to Discharge: Barriers Resolved   Patient Goals and CMS Choice            Discharge Placement              Patient chooses bed at: [facility] Patient to be transferred to facility by: [facility] Name of family member notified: Morna Fangman/daughter Patient and family notified of of transfer: 05/17/24  Discharge Plan and Services Additional resources added to the After Visit Summary for                                       Social Drivers of Health (SDOH) Interventions SDOH Screenings   Food Insecurity: Patient Unable To Answer (05/16/2024)  Housing: Unknown (05/16/2024)  Transportation Needs: Patient Unable To Answer (05/16/2024)  Utilities: Patient Unable To Answer (05/16/2024)  Depression (PHQ2-9): Low Risk (07/29/2022)  Social Connections: Patient Unable To Answer (05/16/2024)  Tobacco Use: Medium Risk (05/15/2024)     Readmission Risk Interventions    10/29/2021   10:49 AM  Readmission Risk Prevention Plan  Transportation Screening Complete  PCP or Specialist Appt within 5-7 Days Complete  Home Care Screening Complete  Medication Review (RN CM) Complete

## 2024-05-17 NOTE — NC FL2 (Signed)
 " [place]  MEDICAID FL2 LEVEL OF CARE FORM     IDENTIFICATION  Patient Name: Daisy Oliver Birthdate: September 15, 1935 Sex: female Admission Date (Current Location): 05/15/2024  [facility] and [place] Number:  Producer, Television/film/video and Address:  [facility], [address] [place], [place] [address]      Provider Number: [id number]  Attending Physician Name and Address:  Raenelle Coria, MD  Relative Name and Phone Number:  Nile Dorning: [phone]    Current Level of Care: Hospital Recommended Level of Care: Assisted Living Facility Prior Approval Number:    Date Approved/Denied:   PASRR Number:    Discharge Plan: Other (Comment) [facility])    Current Diagnoses: Patient Active Problem List   Diagnosis Date Noted   Syncope 05/16/2024   Port-A-Cath in place 12/22/2022   Large B-cell lymphoma (HCC) 01/27/2022   Normocytic anemia 01/21/2022   CNS lymphoma (HCC) 01/20/2022   Leukocytosis 01/20/2022   Vasogenic edema (HCC) 01/20/2022   Hypokalemia 12/05/2021   Abdominal aortic atherosclerosis (HCC) - per PET 09/22/2021 09/24/2021   B12 deficiency 09/23/2021   Bowel and bladder incontinence 09/23/2021   Lumbar back pain 09/23/2021   Status post stereotactic brain biopsy 09/02/2021   Primary CNS lymphoma (HCC) 08/28/2021   Neoplasm of brain causing mass effect and brain compression on adjacent structures (HCC) 08/27/2021   Age-related osteoporosis without current pathological fracture 10/10/2020   CKD (chronic kidney disease) stage 2, GFR 60-89 ml/min 03/29/2020   History of basal cell cancer 03/17/2018   Vitamin D  deficiency 08/28/2014   Medication management 08/28/2014   Acquired hypothyroidism 08/28/2014   Essential hypertension 08/25/2013   Hyperlipidemia 08/25/2013   Migraines 08/25/2013   Rosacea 08/25/2013    Orientation RESPIRATION BLADDER Height & Weight     Self, Place  Normal External catheter Weight: 127 lb 6.8 oz (57.8  kg) Height:     BEHAVIORAL SYMPTOMS/MOOD NEUROLOGICAL BOWEL NUTRITION STATUS      Continent Diet (please refer to dc summary)  AMBULATORY STATUS COMMUNICATION OF NEEDS Skin   Extensive Assist Verbally                         Personal Care Assistance Level of Assistance  Bathing, Dressing, Feeding Bathing Assistance: Limited assistance Feeding assistance: Limited assistance Dressing Assistance: Limited assistance     Functional Limitations Info  Sight, Hearing, Speech Sight Info: Adequate Hearing Info: Adequate Speech Info: Adequate    SPECIAL CARE FACTORS FREQUENCY  PT (By licensed PT), OT (By licensed OT)     PT Frequency: 5x/week OT Frequency: 5x/week            Contractures Contractures Info: Not present    Additional Factors Info  Allergies, Code Status Code Status Info: DNR Allergies Info: Barbiturates, gluten meal, pentothal, sulfa antibiotics, patient has AIP and is not to have hormones or red wine           Current Medications (05/17/2024):  This is the current hospital active medication list Current Facility-Administered Medications  Medication Dose Route Frequency Provider Last Rate Last Admin   acetaminophen  (TYLENOL ) tablet 650 mg  650 mg Oral Q6H PRN Acheampong, Peter K, MD   650 mg at 05/17/24 0049   Or   acetaminophen  (TYLENOL ) suppository 650 mg  650 mg Rectal Q6H PRN Acheampong, Peter K, MD       famotidine  (PEPCID ) tablet 20 mg  20 mg Oral QHS PRN Acheampong, Peter K,  MD       fentaNYL  (SUBLIMAZE ) injection 50 mcg  50 mcg Intravenous Q1H PRN Melvenia Motto, MD   50 mcg at 05/16/24 0038   levETIRAcetam  (KEPPRA ) tablet 500 mg  500 mg Oral BID Raenelle Coria, MD       liothyronine  (CYTOMEL ) tablet 5 mcg  5 mcg Oral BID Ghimire, Kuber, MD   5 mcg at 05/16/24 2112   melatonin tablet 10 mg  10 mg Oral QHS Acheampong, Peter K, MD   10 mg at 05/16/24 2112   mirabegron  ER (MYRBETRIQ ) tablet 50 mg  50 mg Oral Daily Acheampong, Peter K, MD   50 mg at  05/16/24 1725   ondansetron  (ZOFRAN ) tablet 4 mg  4 mg Oral Q6H PRN Acheampong, Peter K, MD       Or   ondansetron  (ZOFRAN ) injection 4 mg  4 mg Intravenous Q6H PRN Acheampong, Peter K, MD       oxyCODONE -acetaminophen  (PERCOCET/ROXICET) 5-325 MG per tablet 1 tablet  1 tablet Oral Q6H PRN Acheampong, Peter K, MD       senna-docusate (Senokot-S) tablet 1 tablet  1 tablet Oral QHS PRN Acheampong, Peter K, MD       sodium chloride  flush (NS) 0.9 % injection 3 mL  3 mL Intravenous Q12H Marca Maude POUR, MD   3 mL at 05/16/24 2113   sodium chloride  flush (NS) 0.9 % injection 3 mL  3 mL Intravenous Q12H Marca Maude POUR, MD   3 mL at 05/16/24 2113   sodium chloride  flush (NS) 0.9 % injection 3 mL  3 mL Intravenous PRN Acheampong, Peter K, MD         Discharge Medications: Please see discharge summary for a list of discharge medications.  Relevant Imaging Results:  Relevant Lab Results:   Additional Information [id number]  Sherline Clack, LCSWA     "

## 2024-05-17 NOTE — Plan of Care (Signed)
" °  Problem: Cardiac: Goal: Will achieve and/or maintain adequate cardiac output Outcome: Progressing   Problem: Physical Regulation: Goal: Complications related to the disease process, condition or treatment will be avoided or minimized Outcome: Progressing   Problem: Clinical Measurements: Goal: Ability to maintain clinical measurements within normal limits will improve Outcome: Progressing Goal: Will remain free from infection Outcome: Progressing Goal: Diagnostic test results will improve Outcome: Progressing Goal: Cardiovascular complication will be avoided Outcome: Progressing   Problem: Coping: Goal: Level of anxiety will decrease Outcome: Progressing   Problem: Elimination: Goal: Will not experience complications related to bowel motility Outcome: Progressing Goal: Will not experience complications related to urinary retention Outcome: Progressing   Problem: Pain Managment: Goal: General experience of comfort will improve and/or be controlled Outcome: Progressing   Problem: Safety: Goal: Ability to remain free from injury will improve Outcome: Progressing   Problem: Skin Integrity: Goal: Risk for impaired skin integrity will decrease Outcome: Progressing   "

## 2024-05-19 ENCOUNTER — Emergency Department (HOSPITAL_COMMUNITY)

## 2024-05-19 ENCOUNTER — Other Ambulatory Visit: Payer: Self-pay

## 2024-05-19 ENCOUNTER — Encounter: Payer: Self-pay | Admitting: Internal Medicine

## 2024-05-19 ENCOUNTER — Observation Stay (HOSPITAL_COMMUNITY)
Admission: EM | Admit: 2024-05-19 | Source: Home / Self Care | Attending: Emergency Medicine | Admitting: Emergency Medicine

## 2024-05-19 DIAGNOSIS — S2241XA Multiple fractures of ribs, right side, initial encounter for closed fracture: Principal | ICD-10-CM

## 2024-05-19 DIAGNOSIS — S2239XA Fracture of one rib, unspecified side, initial encounter for closed fracture: Secondary | ICD-10-CM | POA: Diagnosis present

## 2024-05-19 DIAGNOSIS — N390 Urinary tract infection, site not specified: Secondary | ICD-10-CM

## 2024-05-19 DIAGNOSIS — S42131A Displaced fracture of coracoid process, right shoulder, initial encounter for closed fracture: Secondary | ICD-10-CM

## 2024-05-19 DIAGNOSIS — E039 Hypothyroidism, unspecified: Secondary | ICD-10-CM

## 2024-05-19 DIAGNOSIS — T07XXXA Unspecified multiple injuries, initial encounter: Secondary | ICD-10-CM

## 2024-05-19 LAB — COMPREHENSIVE METABOLIC PANEL WITH GFR
ALT: 11 U/L (ref 0–44)
AST: 23 U/L (ref 15–41)
Albumin: 3.2 g/dL — ABNORMAL LOW (ref 3.5–5.0)
Alkaline Phosphatase: 80 U/L (ref 38–126)
Anion gap: 7 (ref 5–15)
BUN: 14 mg/dL (ref 8–23)
CO2: 30 mmol/L (ref 22–32)
Calcium: 8.9 mg/dL (ref 8.9–10.3)
Chloride: 99 mmol/L (ref 98–111)
Creatinine, Ser: 0.6 mg/dL (ref 0.44–1.00)
GFR, Estimated: >60 mL/min
Glucose, Bld: 122 mg/dL — ABNORMAL HIGH (ref 70–99)
Potassium: 3.9 mmol/L (ref 3.5–5.1)
Sodium: 136 mmol/L (ref 135–145)
Total Bilirubin: 0.6 mg/dL (ref 0.0–1.2)
Total Protein: 6.4 g/dL — ABNORMAL LOW (ref 6.5–8.1)

## 2024-05-19 LAB — URINALYSIS, ROUTINE W REFLEX MICROSCOPIC
Bilirubin Urine: NEGATIVE
Glucose, UA: NEGATIVE mg/dL
Hgb urine dipstick: NEGATIVE
Ketones, ur: NEGATIVE mg/dL
Nitrite: POSITIVE — AB
Protein, ur: NEGATIVE mg/dL
Specific Gravity, Urine: 1.021 (ref 1.005–1.030)
WBC, UA: >50 WBC/hpf (ref 0–5)
pH: 6 (ref 5.0–8.0)

## 2024-05-19 LAB — CBC
HCT: 32.7 % — ABNORMAL LOW (ref 36.0–46.0)
Hemoglobin: 10.4 g/dL — ABNORMAL LOW (ref 12.0–15.0)
MCH: 30.4 pg (ref 26.0–34.0)
MCHC: 31.8 g/dL (ref 30.0–36.0)
MCV: 95.6 fL (ref 80.0–100.0)
Platelets: 188 10*3/uL (ref 150–400)
RBC: 3.42 MIL/uL — ABNORMAL LOW (ref 3.87–5.11)
RDW: 13.5 % (ref 11.5–15.5)
WBC: 9.1 10*3/uL (ref 4.0–10.5)
nRBC: 0 % (ref 0.0–0.2)

## 2024-05-19 MED ORDER — SODIUM CHLORIDE 0.9 % IV SOLN: 1.0000 g | Freq: Once | INTRAVENOUS | Status: AC

## 2024-05-19 MED ORDER — MORPHINE SULFATE (PF) 4 MG/ML IV SOLN
4.0000 mg | Freq: Once | INTRAVENOUS | Status: AC
  Administered 2024-05-19: 4 mg via INTRAVENOUS
  Filled 2024-05-19: qty 1

## 2024-05-19 MED ORDER — ACETAMINOPHEN 500 MG PO TABS
1000.0000 mg | ORAL_TABLET | Freq: Once | ORAL | Status: AC
  Administered 2024-05-19: 1000 mg via ORAL
  Filled 2024-05-19: qty 2

## 2024-05-19 NOTE — ED Provider Notes (Cosign Needed)
 " [facility] AT [facility] Provider Note   CSN: [id number] Arrival date & time: 05/19/24  2028     Patient presents with: Daisy Oliver is a 89 y.o. female.   The history is provided by the patient, the EMS personnel and medical records. No language interpreter was used.  Fall     89 year old female with history of hypertension, vitamin D  deficiency, skin cancer, CKD, lymphoma brought here via EMS from [place]  nursing facility for evaluation of a fall.  Patient was just recently discharged from hospital a few days ago after she was found to be on the floor from an unwitnessed fall either due to passing out or seizures.  She has history of brain radiation for lymphoma.  She has pretty extensive evaluation including multiple CT scans without any acute fracture or significant injury.  She was subsequently discharged back to the facility 2 days ago.  Since being this facility, patient complaining of pain to the right side of her chest.  States the pain to started tonight.  Denies any new injury.  No complaints of shortness of breath, nausea, vomiting, cough.  Denies any worsening headache or neck pain.  Prior to Admission medications  Medication Sig Start Date End Date Taking? Authorizing Provider  acetaminophen  (TYLENOL ) 325 MG tablet Take 2 tablets (650 mg total) by mouth every 6 (six) hours as needed for mild pain (pain score 1-3), fever or headache (or Fever >/= 101). 05/17/24 06/16/24  Raenelle Coria, MD  celecoxib  (CELEBREX ) 100 MG capsule Take 1 capsule (100 mg total) by mouth daily as needed (for pain in legs and back). 05/17/24   Raenelle Coria, MD  famotidine  (PEPCID ) 20 MG tablet Take 20 mg by mouth daily as needed for heartburn.    [provider]  gabapentin  (NEURONTIN ) 100 MG capsule TAKE 1 CAPSULE BY MOUTH TWICE A DAY 04/10/24   Vaslow, Zachary K, MD  levETIRAcetam  (KEPPRA ) 500 MG tablet Take 1 tablet (500 mg total) by mouth 2  (two) times daily. 05/17/24   Raenelle Coria, MD  lidocaine  (HM LIDOCAINE  PATCH) 4 % Place 1 patch onto the skin daily. Apply to right hip    [provider]  liothyronine  (CYTOMEL ) 5 MCG tablet TAKE 5 MCG BY MOUTH IN THE MORNING AND AT BEDTIME. 01/28/23   Wilkinson, Dana E, NP  Menthol, Topical Analgesic, (BIOFREEZE EX) Apply 1 Application topically at bedtime as needed (leg pain).    [provider]  MYRBETRIQ  50 MG TB24 tablet 1 tablet Orally Once a day for 30 days 11/05/22   [provider]  oxyCODONE -acetaminophen  (PERCOCET) 5-325 MG tablet Take 1 tablet by mouth every 6 (six) hours as needed. Patient taking differently: Take 1 tablet by mouth every 6 (six) hours as needed for moderate pain (pain score 4-6). 04/12/24   Patt Alm Macho, MD  senna (SENOKOT) 8.6 MG TABS tablet Take 1 tablet by mouth daily.    [provider]  sertraline (ZOLOFT) 50 MG tablet Take 50 mg by mouth daily. 11/11/23   [provider]    Allergies: Barbiturates, Gluten meal, Other, Pentothal [thiopental], and Sulfa antibiotics    Review of Systems  All other systems reviewed and are negative.   Updated Vital Signs BP 133/61   Pulse 74   Temp 98.2 F (36.8 C) (Oral)   Resp 17   SpO2 96%   Physical Exam Vitals and nursing note reviewed.  Constitutional:  General: She is not in acute distress.    Appearance: She is well-developed.     Comments: Elderly female laying in bed in no acute discomfort.  HENT:     Head: Normocephalic.     Comments: Periorbital ecchymosis involving the right periorbital region, bruising to the nasal bridge and right zygomatic arch with some tenderness to palpation.  Tenderness to palpation of the scalp without any crepitus. Eyes:     Conjunctiva/sclera: Conjunctivae normal.  Cardiovascular:     Rate and Rhythm: Normal rate and regular rhythm.     Pulses: Normal pulses.     Heart sounds: Normal heart sounds.  Pulmonary:      Effort: Pulmonary effort is normal.  Chest:     Chest wall: Tenderness (Tenderness to to right anterior chest wall without any bruising noted.  No crepitus or emphysema.) present.  Abdominal:     Palpations: Abdomen is soft.  Musculoskeletal:     Cervical back: Normal range of motion and neck supple.     Comments: Left leg with red discoloration appears chronic.  It is diffusely tender.  Skin:    Findings: No rash.  Neurological:     Mental Status: She is alert. She is disoriented.  Psychiatric:        Mood and Affect: Mood normal.     (all labs ordered are listed, but only abnormal results are displayed) Labs Reviewed  COMPREHENSIVE METABOLIC PANEL WITH GFR - Abnormal; Notable for the following components:      Result Value   Glucose, Bld 122 (*)    Total Protein 6.4 (*)    Albumin 3.2 (*)    All other components within normal limits  CBC - Abnormal; Notable for the following components:   RBC 3.42 (*)    Hemoglobin 10.4 (*)    HCT 32.7 (*)    All other components within normal limits  URINALYSIS, ROUTINE W REFLEX MICROSCOPIC - Abnormal; Notable for the following components:   APPearance HAZY (*)    Nitrite POSITIVE (*)    Leukocytes,Ua LARGE (*)    Bacteria, UA RARE (*)    Non Squamous Epithelial 0-5 (*)    All other components within normal limits  URINE CULTURE  CBG MONITORING, ED    EKG: None ED ECG REPORT   Date: 05/19/2024  Rate: 74  Rhythm: normal sinus rhythm  QRS Axis: normal  Intervals: normal  ST/T Wave abnormalities: normal  Conduction Disutrbances:nonspecific intraventricular conduction delay  Narrative Interpretation:   Old EKG Reviewed: unchanged  I have personally reviewed the EKG tracing and agree with the computerized printout as noted.   Radiology: CT Chest Wo Contrast Result Date: 05/19/2024 EXAM: CT CHEST WITHOUT CONTRAST 05/19/2024 09:28:29 PM TECHNIQUE: CT of the chest was performed without the administration of intravenous contrast.  Multiplanar reformatted images are provided for review. Automated exposure control, iterative reconstruction, and/or weight based adjustment of the mA/kV was utilized to reduce the radiation dose to as low as reasonably achievable. COMPARISON: 05/15/2024 CLINICAL HISTORY: A fall several days ago with chest pain, initial encounter. FINDINGS: MEDIASTINUM: Heart: Mild coronary calcifications are seen. Heart and pericardium are otherwise unremarkable. Vessels: Thoracic aorta shows calcifications without aneurysmal dilatation. Central airways: The central airways are clear. Esophagus: The esophagus, as visualized, is within normal limits. LYMPH NODES: No mediastinal, hilar or axillary lymphadenopathy. LUNGS AND PLEURA: Mild bibasilar atelectasis is seen. No sizable effusion is noted. No underlying pneumothorax is seen. SOFT TISSUES/BONES: Bones: There is a minimally displaced  fracture at the anterior aspect of the right coracoid process. Fractures involving the right fifth, seventh, and eighth ribs anteriorly are seen. No compression deformity is noted. The humeral head is well seated in the glenoid. Soft Tissues: Mild edema is noted in the right chest wall related to the bony injuries. UPPER ABDOMEN: Limited images of the upper abdomen demonstrates calcified splenic granulomas. IMPRESSION: 1. Fractures involving the right fifth, seventh, and eighth ribs anteriorly, without underlying pneumothorax or sizable effusion. 2. Minimally displaced fracture at the anterior aspect of the right coracoid process. 3. Mild edema in the right chest wall related to the bony injuries. Electronically signed by: Oneil Devonshire MD 05/19/2024 09:39 PM EST RP Workstation: [id number]   CT Maxillofacial Wo Contrast Result Date: 05/19/2024 EXAM: CT OF THE FACE WITHOUT CONTRAST 05/19/2024 09:28:29 PM TECHNIQUE: CT of the face was performed without the administration of intravenous contrast. Multiplanar reformatted images are provided for review.  Automated exposure control, iterative reconstruction, and/or weight based adjustment of the mA/kV was utilized to reduce the radiation dose to as low as reasonably achievable. COMPARISON: None available. CLINICAL HISTORY: Polytrauma, blunt Polytrauma, blunt. FINDINGS: FACIAL BONES: No acute facial fracture. No mandibular dislocation. No suspicious bone lesion. ORBITS: Globes are intact. No acute traumatic injury. No inflammatory change. SINUSES AND MASTOIDS: No acute abnormality. SOFT TISSUES: No acute abnormality. IMPRESSION: 1. No acute facial fracture. Electronically signed by: Franky Crease MD 05/19/2024 09:37 PM EST RP Workstation: [id number]   CT Cervical Spine Wo Contrast Result Date: 05/19/2024 EXAM: CT CERVICAL SPINE WITHOUT CONTRAST 05/19/2024 09:28:29 PM TECHNIQUE: CT of the cervical spine was performed without the administration of intravenous contrast. Multiplanar reformatted images are provided for review. Automated exposure control, iterative reconstruction, and/or weight based adjustment of the mA/kV was utilized to reduce the radiation dose to as low as reasonably achievable. COMPARISON: None available. CLINICAL HISTORY: Facial trauma, blunt. FINDINGS: BONES AND ALIGNMENT: No acute fracture or traumatic malalignment. Partial fusion across the C4-C5 disc space appears congenital. DEGENERATIVE CHANGES: Multilevel degenerative disc and facet disease. SOFT TISSUES: No prevertebral soft tissue swelling. IMPRESSION: 1. No evidence of acute traumatic injury. Electronically signed by: Franky Crease MD 05/19/2024 09:35 PM EST RP Workstation: [id number]   CT HEAD WO CONTRAST Result Date: 05/19/2024 EXAM: CT HEAD WITHOUT CONTRAST 05/19/2024 09:28:29 PM TECHNIQUE: CT of the head was performed without the administration of intravenous contrast. Automated exposure control, iterative reconstruction, and/or weight based adjustment of the mA/kV was utilized to reduce the radiation dose to as low as reasonably  achievable. COMPARISON: 05/15/2024 CLINICAL HISTORY: Blunt facial trauma. FINDINGS: BRAIN AND VENTRICLES: No acute hemorrhage. No evidence of acute infarct. Encephalomalacia again noted in right frontal lobe and right insular cortex. Confluent hypodensities in bilateral cerebral white matter compatible with severe chronic small vessel ischemic disease. Mild cerebral atrophy. Ex vacuo dilatation of right lateral ventricle. No hydrocephalus. No extra-axial collection. No mass effect or midline shift. Calcified atherosclerosis at the skull base. SINUSES: No acute abnormality. SOFT TISSUES AND SKULL: No acute soft tissue abnormality. No skull fracture. IMPRESSION: 1. No acute intracranial abnormality. 2. Encephalomalacia in the right frontal lobe and right insular cortex. 3. Severe chronic small vessel ischemic disease in the bilateral cerebral white matter. Electronically signed by: Franky Crease MD 05/19/2024 09:33 PM EST RP Workstation: [id number]     Procedures   Medications Ordered in the ED  cefTRIAXone (ROCEPHIN) 1 g in sodium chloride  0.9 % 100 mL IVPB (has no administration in time range)  acetaminophen  (TYLENOL ) tablet  1,000 mg (1,000 mg Oral Given 05/19/24 2318)  morphine  (PF) 4 MG/ML injection 4 mg (4 mg Intravenous Given 05/19/24 2319)                                    Medical Decision Making Amount and/or Complexity of Data Reviewed Labs: ordered. Radiology: ordered. ECG/medicine tests: ordered.  Risk OTC drugs. Prescription drug management.   BP 133/61   Pulse 74   Temp 98.2 F (36.8 C) (Oral)   Resp 17   SpO2 96%   70:48 PM  89 year old female with history of hypertension, vitamin D  deficiency, skin cancer, CKD, lymphoma brought here via EMS from [place]  nursing facility for evaluation of a fall.  Patient was just recently discharged from hospital a few days ago after she was found to be on the floor from an unwitnessed fall either due to passing out or seizures.  She  has history of brain radiation for lymphoma.  She has pretty extensive evaluation including multiple CT scans without any acute fracture or significant injury.  She was subsequently discharged back to the facility 2 days ago.  Since being this facility, patient complaining of pain to the right side of her chest.  States the pain to started tonight.  Denies any new injury.  No complaints of shortness of breath, nausea, vomiting, cough.  Denies any worsening headache or neck pain.  On exam patient has tenderness to palpation of right anterior lateral chest wall.  She also has ecchymosis involving her face and has tenderness to her scalp.  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  She has a fairly extensive workup including CT scan of her head cervical spine maxillofacial chest abdomen pelvis all of which came back negative for acute fracture or dislocation.  Given her discomfort, and reproducible tenderness, will reimage her head cervical spine and lateral facial as well as CT scan of her chest.  Pain medication given.  -Labs ordered, independently viewed and interpreted by me.  Labs remarkable for UA consistent with UTI, will treat with rocephin.  Urine culture sent. -The patient was maintained on a cardiac monitor.  I personally viewed and interpreted the cardiac monitored which showed an underlying rhythm of: SR -Imaging independently viewed and interpreted by me and I agree with radiologist's interpretation.  Result remarkable for CT scan of the chest demonstrates fractures involving the right 5th, 7th and 8th ribs anteriorly as well as minimally displaced fracture of the anterior aspect of the right coracoid process.  Mild edema to the right chest wall. -This patient presents to the ED for concern of chest wall pain, this involves an extensive number of treatment options, and is a complaint that  carries with it a high risk of complications and morbidity.  The differential diagnosis includes fracture, dislocation, contusion, strain, sprain, ribs fracture -Co morbidities that complicate the patient evaluation includes HTN, vitD deficiency, lymphoma, CKD -Treatment includes tylenol , morphine  -Reevaluation of the patient after these medicines showed that the patient improved -PCP office notes or outside notes reviewed -Discussion with specialist including trauma surgeon Dr. Rubin who request for medicine admission.  I have consulted with [facility] Dr. Trixie who agrees to admit pt to [facility] for pain control.  I have left a secure message to Dr. Rubin to make him  aware of pt's transfer and for trauma team to be involve.   -Escalation to admission/observation considered: pt agrees with admission.  I also have notified pt's daughter of plan over the phone.       Final diagnoses:  Fracture of three ribs, right, closed, initial encounter  Closed displaced fracture of coracoid process of right shoulder, initial encounter  Multiple contusions  Acute lower UTI    ED Discharge Orders     None          Nivia Colon, PA-C 05/19/24 2356  "

## 2024-05-19 NOTE — ED Triage Notes (Signed)
 Patient BIB EMS from [place]  c/o fall last Monday. Patient was recently discharge 2 days ago. Staff report patient c/o right rib cage pain tonight. No report any recent fall or injury.   BP 137/87 HR 70 RR 20 [id number] 99 % on RA.

## 2024-05-20 DIAGNOSIS — S2239XA Fracture of one rib, unspecified side, initial encounter for closed fracture: Secondary | ICD-10-CM | POA: Diagnosis present

## 2024-05-20 NOTE — H&P (Incomplete)
 " History and Physical    Daisy Oliver FMW:[id number] DOB: Apr 13, 1936 DOA: 05/19/2024  I have briefly reviewed the patient's prior medical records in [facility]  PCP: Clarice Nottingham, MD  Patient coming from: ***  Chief Complaint: ***  HPI: Daisy Oliver is a 89 y.o. female with medical history significant of ***   ED Course: ***  Review of Systems: All systems reviewed, and apart from HPI, all negative  Past Medical History:  Diagnosis Date   Acute intermittent porphyria (HCC)    diagnosed at age 47   Cancer [facility])    CNS lymphoma (HCC)    COVID-19 08/26/2020   Fracture of radial head, right, closed 09/16/2012   GERD (gastroesophageal reflux disease)    pepcid  prn   Hypertension    Hypothyroidism    PONV (postoperative nausea and vomiting)     Past Surgical History:  Procedure Laterality Date   APPLICATION OF CRANIAL NAVIGATION Right 09/02/2021   Procedure: APPLICATION OF CRANIAL NAVIGATION;  Surgeon: Debby Dorn MATSU, MD;  Location: [facility] OR;  Service: Neurosurgery;  Laterality: Right;   CATARACT EXTRACTION, BILATERAL  2020   Dr. Cleatus   FRAMELESS  BIOPSY WITH BRAINLAB Right 09/02/2021   Procedure: FRONTAL STEREOTACTIC BRAIN BIOPSY;  Surgeon: Debby Dorn MATSU, MD;  Location: [facility] OR;  Service: Neurosurgery;  Laterality: Right;   IR IMAGING GUIDED PORT INSERTION  09/24/2021   IR REMOVAL TUN ACCESS W/ PORT W/O FL MOD SED  05/12/2023   RADIAL HEAD ARTHROPLASTY Right 09/16/2012   Procedure: RADIAL HEAD ARTHROPLASTY;  Surgeon: Fonda SHAUNNA Olmsted, MD;  Location: [facility];  Service: Orthopedics;  Laterality: Right;  RADIAL HEAD REPLACEMENT       reports that she quit smoking about 40 years ago. Her smoking use included cigarettes. She started smoking about 62 years ago. She has a 44 pack-year smoking history. She has never used smokeless tobacco. She reports that she does not currently use alcohol. She reports that she does not use  drugs.  Allergies[1]  Family History  Problem Relation Age of Onset   Heart disease Mother    Brain cancer Father 67       Brain tumor, not cancerous   Hypertension Sister    Osteoporosis Sister    Arthritis Sister    Scoliosis Sister    Hypertension Sister    Osteoporosis Sister    Cancer Paternal Grandfather    Suicidality Paternal Grandfather    Breast cancer Neg Hx     Prior to Admission medications  Medication Sig Start Date End Date Taking? Authorizing Provider  acetaminophen  (TYLENOL ) 325 MG tablet Take 2 tablets (650 mg total) by mouth every 6 (six) hours as needed for mild pain (pain score 1-3), fever or headache (or Fever >/= 101). 05/17/24 06/16/24  Raenelle Coria, MD  celecoxib  (CELEBREX ) 100 MG capsule Take 1 capsule (100 mg total) by mouth daily as needed (for pain in legs and back). 05/17/24   Raenelle Coria, MD  famotidine  (PEPCID ) 20 MG tablet Take 20 mg by mouth daily as needed for heartburn.    [provider]  gabapentin  (NEURONTIN ) 100 MG capsule TAKE 1 CAPSULE BY MOUTH TWICE A DAY 04/10/24   Vaslow, Zachary K, MD  levETIRAcetam  (KEPPRA ) 500 MG tablet Take 1 tablet (500 mg total) by mouth 2 (two) times daily. 05/17/24   Ghimire, Kuber, MD  lidocaine  (HM LIDOCAINE  PATCH) 4 % Place 1 patch onto the skin daily. Apply to right hip  [provider]  liothyronine  (CYTOMEL ) 5 MCG tablet TAKE 5 MCG BY MOUTH IN THE MORNING AND AT BEDTIME. 01/28/23   Wilkinson, Dana E, NP  Menthol, Topical Analgesic, (BIOFREEZE EX) Apply 1 Application topically at bedtime as needed (leg pain).    [provider]  MYRBETRIQ  50 MG TB24 tablet 1 tablet Orally Once a day for 30 days 11/05/22   [provider]  oxyCODONE -acetaminophen  (PERCOCET) 5-325 MG tablet Take 1 tablet by mouth every 6 (six) hours as needed. Patient taking differently: Take 1 tablet by mouth every 6 (six) hours as needed for moderate pain (pain score 4-6). 04/12/24   Patt Alm Macho, MD  senna (SENOKOT) 8.6 MG TABS tablet Take 1 tablet by mouth daily.    [provider]  sertraline (ZOLOFT) 50 MG tablet Take 50 mg by mouth daily. 11/11/23   [provider]    Physical Exam: Vitals:   05/19/24 2048 05/19/24 2315 05/19/24 2340  BP: 133/61 (!) 143/64 123/67  Pulse: 74 67 76  Resp: 17  (!) 24  Temp: 98.2 F (36.8 C)    TempSrc: Oral    SpO2: 96% 95% 91%      Constitutional: NAD, calm, comfortable Eyes: PERRL, lids and conjunctivae normal ENMT: Mucous membranes are moist. Posterior pharynx clear of any exudate or lesions.Normal dentition.  Neck: normal, supple Respiratory: clear to auscultation bilaterally, no wheezing, no crackles. Normal respiratory effort. No accessory muscle use.  Cardiovascular: Regular rate and rhythm, no murmurs / rubs / gallops. No extremity edema. 2+ pedal pulses.  Abdomen: no tenderness, no masses palpated. Bowel sounds positive.  Musculoskeletal: no clubbing / cyanosis. Normal muscle tone.  Skin: no rashes, lesions, ulcers. No induration Neurologic: CN 2-12 grossly intact. Strength 5/5 in all 4.  Psychiatric: Normal judgment and insight. Alert and oriented x 3. Normal mood.   Labs on Admission: I have personally reviewed following labs and imaging studies  CBC: Recent Labs  Lab 05/15/24 2333 05/15/24 2350 05/16/24 0529 05/19/24 2309  WBC 13.6*  --  9.1 9.1  HGB 12.6 13.3 11.3* 10.4*  HCT 37.7 39.0 34.3* 32.7*  MCV 93.8  --  94.5 95.6  PLT 178  --  154 188   Basic Metabolic Panel: Recent Labs  Lab 05/15/24 2333 05/15/24 2350 05/16/24 0529 05/19/24 2309  NA 136 137 134* 136  K 4.1 3.7 4.1 3.9  CL 97* 96* 98 99  CO2 28  --  27 30  GLUCOSE 127* 127* 114* 122*  BUN 20 22 18 14   CREATININE 0.66 0.70 0.60 0.60  CALCIUM  9.6  --  9.0 8.9  MG 2.0  --   --   --   PHOS  --   --  3.0  --    Liver Function Tests: Recent Labs  Lab 05/15/24 2333 05/19/24 2309  AST 39 23  ALT 20 11  ALKPHOS 90  80  BILITOT 0.9 0.6  PROT 7.7 6.4*  ALBUMIN 4.0 3.2*   Coagulation Profile: Recent Labs  Lab 05/15/24 2333  INR 1.1   BNP (last 3 results) No results for input(s): PROBNP in the last 8760 hours. CBG: Recent Labs  Lab 05/16/24 0555 05/17/24 0448  GLUCAP 108* 87   Thyroid Function Tests: No results for input(s): TSH, T4TOTAL, FREET4, T3FREE, THYROIDAB in the last 72 hours. Urine analysis:    Component Value Date/Time   COLORURINE YELLOW 05/19/2024 2258   APPEARANCEUR HAZY (A) 05/19/2024 2258   LABSPEC 1.021 05/19/2024  2258   PHURINE 6.0 05/19/2024 2258   GLUCOSEU NEGATIVE 05/19/2024 2258   HGBUR NEGATIVE 05/19/2024 2258   BILIRUBINUR NEGATIVE 05/19/2024 2258   KETONESUR NEGATIVE 05/19/2024 2258   PROTEINUR NEGATIVE 05/19/2024 2258   UROBILINOGEN 0.2 08/28/2014 0958   NITRITE POSITIVE (A) 05/19/2024 2258   LEUKOCYTESUR LARGE (A) 05/19/2024 2258     Radiological Exams on Admission: CT Chest Wo Contrast Result Date: 05/19/2024 EXAM: CT CHEST WITHOUT CONTRAST 05/19/2024 09:28:29 PM TECHNIQUE: CT of the chest was performed without the administration of intravenous contrast. Multiplanar reformatted images are provided for review. Automated exposure control, iterative reconstruction, and/or weight based adjustment of the mA/kV was utilized to reduce the radiation dose to as low as reasonably achievable. COMPARISON: 05/15/2024 CLINICAL HISTORY: A fall several days ago with chest pain, initial encounter. FINDINGS: MEDIASTINUM: Heart: Mild coronary calcifications are seen. Heart and pericardium are otherwise unremarkable. Vessels: Thoracic aorta shows calcifications without aneurysmal dilatation. Central airways: The central airways are clear. Esophagus: The esophagus, as visualized, is within normal limits. LYMPH NODES: No mediastinal, hilar or axillary lymphadenopathy. LUNGS AND PLEURA: Mild bibasilar atelectasis is seen. No sizable effusion is noted. No underlying  pneumothorax is seen. SOFT TISSUES/BONES: Bones: There is a minimally displaced fracture at the anterior aspect of the right coracoid process. Fractures involving the right fifth, seventh, and eighth ribs anteriorly are seen. No compression deformity is noted. The humeral head is well seated in the glenoid. Soft Tissues: Mild edema is noted in the right chest wall related to the bony injuries. UPPER ABDOMEN: Limited images of the upper abdomen demonstrates calcified splenic granulomas. IMPRESSION: 1. Fractures involving the right fifth, seventh, and eighth ribs anteriorly, without underlying pneumothorax or sizable effusion. 2. Minimally displaced fracture at the anterior aspect of the right coracoid process. 3. Mild edema in the right chest wall related to the bony injuries. Electronically signed by: Oneil Devonshire MD 05/19/2024 09:39 PM EST RP Workstation: [id number]   CT Maxillofacial Wo Contrast Result Date: 05/19/2024 EXAM: CT OF THE FACE WITHOUT CONTRAST 05/19/2024 09:28:29 PM TECHNIQUE: CT of the face was performed without the administration of intravenous contrast. Multiplanar reformatted images are provided for review. Automated exposure control, iterative reconstruction, and/or weight based adjustment of the mA/kV was utilized to reduce the radiation dose to as low as reasonably achievable. COMPARISON: None available. CLINICAL HISTORY: Polytrauma, blunt Polytrauma, blunt. FINDINGS: FACIAL BONES: No acute facial fracture. No mandibular dislocation. No suspicious bone lesion. ORBITS: Globes are intact. No acute traumatic injury. No inflammatory change. SINUSES AND MASTOIDS: No acute abnormality. SOFT TISSUES: No acute abnormality. IMPRESSION: 1. No acute facial fracture. Electronically signed by: Franky Crease MD 05/19/2024 09:37 PM EST RP Workstation: [id number]   CT Cervical Spine Wo Contrast Result Date: 05/19/2024 EXAM: CT CERVICAL SPINE WITHOUT CONTRAST 05/19/2024 09:28:29 PM TECHNIQUE: CT of the  cervical spine was performed without the administration of intravenous contrast. Multiplanar reformatted images are provided for review. Automated exposure control, iterative reconstruction, and/or weight based adjustment of the mA/kV was utilized to reduce the radiation dose to as low as reasonably achievable. COMPARISON: None available. CLINICAL HISTORY: Facial trauma, blunt. FINDINGS: BONES AND ALIGNMENT: No acute fracture or traumatic malalignment. Partial fusion across the C4-C5 disc space appears congenital. DEGENERATIVE CHANGES: Multilevel degenerative disc and facet disease. SOFT TISSUES: No prevertebral soft tissue swelling. IMPRESSION: 1. No evidence of acute traumatic injury. Electronically signed by: Franky Crease MD 05/19/2024 09:35 PM EST RP Workstation: [id number]   CT HEAD WO CONTRAST Result Date: 05/19/2024 EXAM:  CT HEAD WITHOUT CONTRAST 05/19/2024 09:28:29 PM TECHNIQUE: CT of the head was performed without the administration of intravenous contrast. Automated exposure control, iterative reconstruction, and/or weight based adjustment of the mA/kV was utilized to reduce the radiation dose to as low as reasonably achievable. COMPARISON: 05/15/2024 CLINICAL HISTORY: Blunt facial trauma. FINDINGS: BRAIN AND VENTRICLES: No acute hemorrhage. No evidence of acute infarct. Encephalomalacia again noted in right frontal lobe and right insular cortex. Confluent hypodensities in bilateral cerebral white matter compatible with severe chronic small vessel ischemic disease. Mild cerebral atrophy. Ex vacuo dilatation of right lateral ventricle. No hydrocephalus. No extra-axial collection. No mass effect or midline shift. Calcified atherosclerosis at the skull base. SINUSES: No acute abnormality. SOFT TISSUES AND SKULL: No acute soft tissue abnormality. No skull fracture. IMPRESSION: 1. No acute intracranial abnormality. 2. Encephalomalacia in the right frontal lobe and right insular cortex. 3. Severe chronic small  vessel ischemic disease in the bilateral cerebral white matter. Electronically signed by: Franky Crease MD 05/19/2024 09:33 PM EST RP Workstation: [id number]    EKG: Independently reviewed. ***  Assessment/Plan Principal problem ***  Active problems ***  DVT prophylaxis: ***  Code Status: ***  Family Communication: *** Bed Type: *** Consults called: ***  Obs/Inp: ***  At the time of admission, it appears that the appropriate admission status for this patient is INPATIENT as it is expected that patient will require hospital care > 2 midnights. This is judged to be reasonable and necessary in order to provide the required intensity of service to ensure the patient's safety given: presenting symptoms, initial radiographic and laboratory data and in the context of their chronic comorbidities. Together, these circumstances are felt to place patient at high at high risk for further clinical deterioration threatening life, limb, or organ.  Nilda Fendt, MD, PhD [facility]  Contact via [email or web address]  05/20/2024, 12:01 AM           [1] Allergies Allergen Reactions   Barbiturates Other (See Comments)    Unknown reaction Not listed on the MAR   Gluten Meal Diarrhea and Nausea Only   Other Other (See Comments)    Patient has AIP and is not to have hormones or red wine   Pentothal [Thiopental] Other (See Comments)    Unknown reaction Not listed on the MAR   Sulfa Antibiotics Other (See Comments)    Unknown reaction  "

## 2024-08-09 ENCOUNTER — Encounter (INDEPENDENT_AMBULATORY_CARE_PROVIDER_SITE_OTHER): Admitting: Ophthalmology
